# Patient Record
Sex: Male | Born: 1960 | Race: White | Hispanic: No | Smoking: Former smoker
Health system: Southern US, Community
[De-identification: ages and names within clinical notes are randomized; demographics above are authoritative.]

## PROBLEM LIST (undated history)

## (undated) DIAGNOSIS — D649 Anemia, unspecified: Secondary | ICD-10-CM

## (undated) DIAGNOSIS — I509 Heart failure, unspecified: Secondary | ICD-10-CM

## (undated) DIAGNOSIS — I10 Essential (primary) hypertension: Secondary | ICD-10-CM

## (undated) DIAGNOSIS — F32A Depression, unspecified: Secondary | ICD-10-CM

## (undated) DIAGNOSIS — R55 Syncope and collapse: Secondary | ICD-10-CM

## (undated) DIAGNOSIS — R000 Tachycardia, unspecified: Secondary | ICD-10-CM

## (undated) DIAGNOSIS — G709 Myoneural disorder, unspecified: Secondary | ICD-10-CM

## (undated) DIAGNOSIS — K7460 Unspecified cirrhosis of liver: Secondary | ICD-10-CM

## (undated) DIAGNOSIS — R42 Dizziness and giddiness: Secondary | ICD-10-CM

## (undated) DIAGNOSIS — F419 Anxiety disorder, unspecified: Secondary | ICD-10-CM

## (undated) DIAGNOSIS — R011 Cardiac murmur, unspecified: Secondary | ICD-10-CM

## (undated) HISTORY — DX: Anemia, unspecified: D64.9

## (undated) HISTORY — PX: KNEE SURGERY: SHX244

## (undated) HISTORY — DX: Syncope and collapse: R55

## (undated) HISTORY — PX: LEG SURGERY: SHX1003

## (undated) HISTORY — PX: COLON SURGERY: SHX602

## (undated) HISTORY — DX: Heart failure, unspecified: I50.9

## (undated) HISTORY — DX: Tachycardia, unspecified: R00.0

## (undated) HISTORY — DX: Essential (primary) hypertension: I10

## (undated) HISTORY — DX: Dizziness and giddiness: R42

## (undated) HISTORY — DX: Unspecified cirrhosis of liver: K74.60

---

## 1984-08-22 HISTORY — PX: LEG SURGERY: SHX1003

## 1999-08-23 HISTORY — PX: KNEE SURGERY: SHX244

## 2006-04-24 ENCOUNTER — Inpatient Hospital Stay: Payer: Self-pay | Admitting: Internal Medicine

## 2006-04-28 ENCOUNTER — Ambulatory Visit: Payer: Self-pay | Admitting: Family Medicine

## 2006-04-28 ENCOUNTER — Ambulatory Visit: Payer: Self-pay | Admitting: Critical Care Medicine

## 2006-04-28 ENCOUNTER — Inpatient Hospital Stay (HOSPITAL_COMMUNITY): Admission: AD | Admit: 2006-04-28 | Discharge: 2006-05-17 | Payer: Self-pay | Admitting: Critical Care Medicine

## 2006-05-17 ENCOUNTER — Ambulatory Visit: Payer: Self-pay | Admitting: Internal Medicine

## 2006-05-24 ENCOUNTER — Ambulatory Visit (HOSPITAL_COMMUNITY): Admission: RE | Admit: 2006-05-24 | Discharge: 2006-05-24 | Payer: Self-pay | Admitting: Family Medicine

## 2006-05-25 ENCOUNTER — Ambulatory Visit: Payer: Self-pay | Admitting: Family Medicine

## 2006-06-08 ENCOUNTER — Ambulatory Visit (HOSPITAL_COMMUNITY): Admission: RE | Admit: 2006-06-08 | Discharge: 2006-06-08 | Payer: Self-pay | Admitting: Family Medicine

## 2006-06-08 ENCOUNTER — Ambulatory Visit: Payer: Self-pay | Admitting: Sports Medicine

## 2006-06-15 ENCOUNTER — Ambulatory Visit: Payer: Self-pay | Admitting: Family Medicine

## 2006-06-28 ENCOUNTER — Ambulatory Visit: Payer: Self-pay | Admitting: Family Medicine

## 2006-07-03 ENCOUNTER — Encounter: Admission: RE | Admit: 2006-07-03 | Discharge: 2006-10-01 | Payer: Self-pay | Admitting: Family Medicine

## 2006-07-11 ENCOUNTER — Ambulatory Visit: Payer: Self-pay | Admitting: Family Medicine

## 2006-07-28 ENCOUNTER — Ambulatory Visit: Payer: Self-pay | Admitting: Family Medicine

## 2006-08-11 ENCOUNTER — Ambulatory Visit: Payer: Self-pay | Admitting: Family Medicine

## 2006-08-18 ENCOUNTER — Ambulatory Visit: Payer: Self-pay | Admitting: Family Medicine

## 2006-09-04 ENCOUNTER — Encounter (INDEPENDENT_AMBULATORY_CARE_PROVIDER_SITE_OTHER): Payer: Self-pay | Admitting: Family Medicine

## 2006-09-04 ENCOUNTER — Ambulatory Visit: Payer: Self-pay | Admitting: Sports Medicine

## 2006-09-04 ENCOUNTER — Ambulatory Visit (HOSPITAL_COMMUNITY): Admission: RE | Admit: 2006-09-04 | Discharge: 2006-09-04 | Payer: Self-pay | Admitting: Family Medicine

## 2006-09-04 LAB — CONVERTED CEMR LAB
ALT: 21 units/L (ref 0–53)
AST: 22 units/L (ref 0–37)
Creatinine, Ser: 1.18 mg/dL (ref 0.40–1.50)
Sodium: 135 meq/L (ref 135–145)
Total Bilirubin: 0.9 mg/dL (ref 0.3–1.2)
Total Protein: 7.7 g/dL (ref 6.0–8.3)

## 2006-10-02 ENCOUNTER — Ambulatory Visit: Payer: Self-pay | Admitting: Family Medicine

## 2006-10-11 ENCOUNTER — Encounter: Admission: RE | Admit: 2006-10-11 | Discharge: 2007-01-09 | Payer: Self-pay | Admitting: Family Medicine

## 2006-10-17 ENCOUNTER — Ambulatory Visit: Payer: Self-pay | Admitting: Family Medicine

## 2006-10-18 ENCOUNTER — Encounter (INDEPENDENT_AMBULATORY_CARE_PROVIDER_SITE_OTHER): Payer: Self-pay | Admitting: Family Medicine

## 2006-10-18 LAB — CONVERTED CEMR LAB
Albumin: 3.9 g/dL (ref 3.5–5.2)
Alkaline Phosphatase: 73 units/L (ref 39–117)
CO2: 22 meq/L (ref 19–32)
Calcium: 9.3 mg/dL (ref 8.4–10.5)
Chloride: 101 meq/L (ref 96–112)
Digitoxin Lvl: 0.5 ng/mL — ABNORMAL LOW (ref 0.8–2.0)
Glucose, Bld: 382 mg/dL — ABNORMAL HIGH (ref 70–99)
Potassium: 4.5 meq/L (ref 3.5–5.3)
Sodium: 133 meq/L — ABNORMAL LOW (ref 135–145)
Total Protein: 7.2 g/dL (ref 6.0–8.3)

## 2006-10-19 DIAGNOSIS — F1011 Alcohol abuse, in remission: Secondary | ICD-10-CM | POA: Insufficient documentation

## 2006-10-19 DIAGNOSIS — F1021 Alcohol dependence, in remission: Secondary | ICD-10-CM

## 2006-10-19 DIAGNOSIS — E1140 Type 2 diabetes mellitus with diabetic neuropathy, unspecified: Secondary | ICD-10-CM

## 2006-10-25 ENCOUNTER — Telehealth: Payer: Self-pay | Admitting: *Deleted

## 2006-11-06 ENCOUNTER — Telehealth (INDEPENDENT_AMBULATORY_CARE_PROVIDER_SITE_OTHER): Payer: Self-pay | Admitting: Family Medicine

## 2006-11-08 ENCOUNTER — Encounter (INDEPENDENT_AMBULATORY_CARE_PROVIDER_SITE_OTHER): Payer: Self-pay | Admitting: Family Medicine

## 2006-11-21 ENCOUNTER — Encounter (INDEPENDENT_AMBULATORY_CARE_PROVIDER_SITE_OTHER): Payer: Self-pay | Admitting: Family Medicine

## 2006-11-24 ENCOUNTER — Ambulatory Visit: Payer: Self-pay | Admitting: Family Medicine

## 2006-11-24 ENCOUNTER — Telehealth (INDEPENDENT_AMBULATORY_CARE_PROVIDER_SITE_OTHER): Payer: Self-pay | Admitting: Family Medicine

## 2006-11-24 ENCOUNTER — Encounter (INDEPENDENT_AMBULATORY_CARE_PROVIDER_SITE_OTHER): Payer: Self-pay | Admitting: Family Medicine

## 2006-11-24 LAB — CONVERTED CEMR LAB
Calcium: 9.6 mg/dL (ref 8.4–10.5)
Digitoxin Lvl: 0.8 ng/mL (ref 0.8–2.0)
Glucose, Bld: 163 mg/dL — ABNORMAL HIGH (ref 70–99)
Sodium: 135 meq/L (ref 135–145)

## 2006-11-25 ENCOUNTER — Encounter (INDEPENDENT_AMBULATORY_CARE_PROVIDER_SITE_OTHER): Payer: Self-pay | Admitting: Family Medicine

## 2006-12-11 ENCOUNTER — Telehealth: Payer: Self-pay | Admitting: *Deleted

## 2006-12-20 ENCOUNTER — Encounter (INDEPENDENT_AMBULATORY_CARE_PROVIDER_SITE_OTHER): Payer: Self-pay | Admitting: Family Medicine

## 2006-12-25 ENCOUNTER — Telehealth: Payer: Self-pay | Admitting: *Deleted

## 2006-12-27 ENCOUNTER — Telehealth (INDEPENDENT_AMBULATORY_CARE_PROVIDER_SITE_OTHER): Payer: Self-pay | Admitting: *Deleted

## 2007-02-15 ENCOUNTER — Encounter: Admission: RE | Admit: 2007-02-15 | Discharge: 2007-02-15 | Payer: Self-pay | Admitting: Family Medicine

## 2007-02-20 ENCOUNTER — Encounter (INDEPENDENT_AMBULATORY_CARE_PROVIDER_SITE_OTHER): Payer: Self-pay | Admitting: Family Medicine

## 2007-02-20 ENCOUNTER — Ambulatory Visit: Payer: Self-pay | Admitting: Sports Medicine

## 2007-02-20 DIAGNOSIS — I10 Essential (primary) hypertension: Secondary | ICD-10-CM | POA: Insufficient documentation

## 2007-02-20 LAB — CONVERTED CEMR LAB
AST: 21 units/L (ref 0–37)
Albumin: 4.4 g/dL (ref 3.5–5.2)
Alkaline Phosphatase: 71 units/L (ref 39–117)
Chloride: 100 meq/L (ref 96–112)
Digitoxin Lvl: 0.8 ng/mL (ref 0.8–2.0)
Glucose, Bld: 294 mg/dL — ABNORMAL HIGH (ref 70–99)
Hgb A1c MFr Bld: 6 %
MCHC: 34 g/dL (ref 30.0–36.0)
Potassium: 4.6 meq/L (ref 3.5–5.3)
RBC: 4.15 M/uL — ABNORMAL LOW (ref 4.22–5.81)
Sodium: 132 meq/L — ABNORMAL LOW (ref 135–145)
Total Protein: 7.7 g/dL (ref 6.0–8.3)

## 2007-02-21 ENCOUNTER — Encounter (INDEPENDENT_AMBULATORY_CARE_PROVIDER_SITE_OTHER): Payer: Self-pay | Admitting: Family Medicine

## 2007-02-28 ENCOUNTER — Encounter (INDEPENDENT_AMBULATORY_CARE_PROVIDER_SITE_OTHER): Payer: Self-pay | Admitting: Family Medicine

## 2007-04-05 ENCOUNTER — Ambulatory Visit: Payer: Self-pay | Admitting: Family Medicine

## 2007-04-12 ENCOUNTER — Encounter (INDEPENDENT_AMBULATORY_CARE_PROVIDER_SITE_OTHER): Payer: Self-pay | Admitting: Family Medicine

## 2007-04-25 ENCOUNTER — Telehealth: Payer: Self-pay | Admitting: *Deleted

## 2007-05-10 ENCOUNTER — Encounter (INDEPENDENT_AMBULATORY_CARE_PROVIDER_SITE_OTHER): Payer: Self-pay | Admitting: Family Medicine

## 2007-05-17 ENCOUNTER — Encounter (INDEPENDENT_AMBULATORY_CARE_PROVIDER_SITE_OTHER): Payer: Self-pay | Admitting: Family Medicine

## 2007-05-17 ENCOUNTER — Encounter: Admission: RE | Admit: 2007-05-17 | Discharge: 2007-05-17 | Payer: Self-pay | Admitting: *Deleted

## 2007-05-18 ENCOUNTER — Ambulatory Visit: Payer: Self-pay | Admitting: Family Medicine

## 2007-05-28 ENCOUNTER — Telehealth (INDEPENDENT_AMBULATORY_CARE_PROVIDER_SITE_OTHER): Payer: Self-pay | Admitting: Family Medicine

## 2007-06-20 ENCOUNTER — Ambulatory Visit: Payer: Self-pay | Admitting: Family Medicine

## 2007-07-26 ENCOUNTER — Encounter (INDEPENDENT_AMBULATORY_CARE_PROVIDER_SITE_OTHER): Payer: Self-pay | Admitting: Family Medicine

## 2007-07-26 ENCOUNTER — Ambulatory Visit: Payer: Self-pay | Admitting: Family Medicine

## 2007-07-27 LAB — CONVERTED CEMR LAB
AST: 19 units/L (ref 0–37)
Alkaline Phosphatase: 53 units/L (ref 39–117)
BUN: 27 mg/dL — ABNORMAL HIGH (ref 6–23)
Creatinine, Ser: 1.28 mg/dL (ref 0.40–1.50)
HCT: 37.8 % — ABNORMAL LOW (ref 39.0–52.0)
Hemoglobin: 12.9 g/dL — ABNORMAL LOW (ref 13.0–17.0)
MCHC: 34.1 g/dL (ref 30.0–36.0)
RDW: 13.4 % (ref 11.5–15.5)

## 2007-08-09 ENCOUNTER — Encounter: Admission: RE | Admit: 2007-08-09 | Discharge: 2007-08-10 | Payer: Self-pay | Admitting: Family Medicine

## 2007-08-10 ENCOUNTER — Telehealth (INDEPENDENT_AMBULATORY_CARE_PROVIDER_SITE_OTHER): Payer: Self-pay | Admitting: Family Medicine

## 2007-08-10 ENCOUNTER — Encounter (INDEPENDENT_AMBULATORY_CARE_PROVIDER_SITE_OTHER): Payer: Self-pay | Admitting: Family Medicine

## 2007-08-27 ENCOUNTER — Telehealth: Payer: Self-pay | Admitting: *Deleted

## 2007-08-29 ENCOUNTER — Telehealth: Payer: Self-pay | Admitting: *Deleted

## 2007-09-05 ENCOUNTER — Telehealth (INDEPENDENT_AMBULATORY_CARE_PROVIDER_SITE_OTHER): Payer: Self-pay | Admitting: Family Medicine

## 2007-09-14 ENCOUNTER — Telehealth (INDEPENDENT_AMBULATORY_CARE_PROVIDER_SITE_OTHER): Payer: Self-pay | Admitting: Family Medicine

## 2007-09-17 ENCOUNTER — Telehealth (INDEPENDENT_AMBULATORY_CARE_PROVIDER_SITE_OTHER): Payer: Self-pay | Admitting: Family Medicine

## 2007-09-18 ENCOUNTER — Telehealth (INDEPENDENT_AMBULATORY_CARE_PROVIDER_SITE_OTHER): Payer: Self-pay | Admitting: Family Medicine

## 2007-09-21 ENCOUNTER — Telehealth (INDEPENDENT_AMBULATORY_CARE_PROVIDER_SITE_OTHER): Payer: Self-pay | Admitting: Family Medicine

## 2007-10-19 ENCOUNTER — Ambulatory Visit: Payer: Self-pay | Admitting: Family Medicine

## 2007-10-29 ENCOUNTER — Telehealth: Payer: Self-pay | Admitting: *Deleted

## 2007-10-30 ENCOUNTER — Telehealth: Payer: Self-pay | Admitting: *Deleted

## 2007-11-08 ENCOUNTER — Encounter: Admission: RE | Admit: 2007-11-08 | Discharge: 2007-11-08 | Payer: Self-pay | Admitting: Family Medicine

## 2007-11-09 ENCOUNTER — Encounter (INDEPENDENT_AMBULATORY_CARE_PROVIDER_SITE_OTHER): Payer: Self-pay | Admitting: Family Medicine

## 2007-11-22 ENCOUNTER — Ambulatory Visit: Payer: Self-pay | Admitting: Family Medicine

## 2008-01-15 ENCOUNTER — Ambulatory Visit: Payer: Self-pay | Admitting: Family Medicine

## 2008-01-15 ENCOUNTER — Encounter (INDEPENDENT_AMBULATORY_CARE_PROVIDER_SITE_OTHER): Payer: Self-pay | Admitting: Family Medicine

## 2008-01-15 LAB — CONVERTED CEMR LAB: Direct LDL: 123 mg/dL — ABNORMAL HIGH

## 2008-02-06 ENCOUNTER — Telehealth: Payer: Self-pay | Admitting: *Deleted

## 2008-02-13 ENCOUNTER — Encounter: Admission: RE | Admit: 2008-02-13 | Discharge: 2008-02-13 | Payer: Self-pay | Admitting: Family Medicine

## 2008-02-14 ENCOUNTER — Encounter (INDEPENDENT_AMBULATORY_CARE_PROVIDER_SITE_OTHER): Payer: Self-pay | Admitting: Family Medicine

## 2008-02-18 ENCOUNTER — Telehealth (INDEPENDENT_AMBULATORY_CARE_PROVIDER_SITE_OTHER): Payer: Self-pay | Admitting: Family Medicine

## 2008-03-05 ENCOUNTER — Telehealth: Payer: Self-pay | Admitting: *Deleted

## 2008-03-25 ENCOUNTER — Telehealth: Payer: Self-pay | Admitting: *Deleted

## 2008-04-01 ENCOUNTER — Telehealth: Payer: Self-pay | Admitting: *Deleted

## 2008-04-02 ENCOUNTER — Ambulatory Visit: Payer: Self-pay | Admitting: Family Medicine

## 2008-05-20 ENCOUNTER — Encounter (INDEPENDENT_AMBULATORY_CARE_PROVIDER_SITE_OTHER): Payer: Self-pay | Admitting: Family Medicine

## 2008-05-23 ENCOUNTER — Ambulatory Visit: Payer: Self-pay | Admitting: Family Medicine

## 2008-05-23 ENCOUNTER — Encounter (INDEPENDENT_AMBULATORY_CARE_PROVIDER_SITE_OTHER): Payer: Self-pay | Admitting: Family Medicine

## 2008-05-23 DIAGNOSIS — E785 Hyperlipidemia, unspecified: Secondary | ICD-10-CM

## 2008-05-23 DIAGNOSIS — E7800 Pure hypercholesterolemia, unspecified: Secondary | ICD-10-CM | POA: Insufficient documentation

## 2008-05-23 LAB — CONVERTED CEMR LAB
ALT: 19 units/L (ref 0–53)
AST: 15 units/L (ref 0–37)
Albumin: 4.6 g/dL (ref 3.5–5.2)
Calcium: 9.5 mg/dL (ref 8.4–10.5)
Chloride: 102 meq/L (ref 96–112)
Hgb A1c MFr Bld: 6.8 %
Potassium: 4.3 meq/L (ref 3.5–5.3)

## 2008-05-26 ENCOUNTER — Encounter (INDEPENDENT_AMBULATORY_CARE_PROVIDER_SITE_OTHER): Payer: Self-pay | Admitting: Family Medicine

## 2008-05-26 ENCOUNTER — Telehealth: Payer: Self-pay | Admitting: *Deleted

## 2008-06-12 ENCOUNTER — Ambulatory Visit: Payer: Self-pay | Admitting: Family Medicine

## 2008-06-19 ENCOUNTER — Ambulatory Visit: Payer: Self-pay | Admitting: Family Medicine

## 2008-07-01 ENCOUNTER — Telehealth: Payer: Self-pay | Admitting: *Deleted

## 2008-07-07 ENCOUNTER — Ambulatory Visit: Payer: Self-pay | Admitting: Family Medicine

## 2008-07-16 ENCOUNTER — Telehealth: Payer: Self-pay | Admitting: Pharmacist

## 2008-07-24 ENCOUNTER — Telehealth (INDEPENDENT_AMBULATORY_CARE_PROVIDER_SITE_OTHER): Payer: Self-pay | Admitting: Family Medicine

## 2008-07-30 ENCOUNTER — Ambulatory Visit: Payer: Self-pay | Admitting: Family Medicine

## 2008-08-06 ENCOUNTER — Encounter: Admission: RE | Admit: 2008-08-06 | Discharge: 2008-08-06 | Payer: Self-pay | Admitting: Family Medicine

## 2008-08-21 ENCOUNTER — Telehealth (INDEPENDENT_AMBULATORY_CARE_PROVIDER_SITE_OTHER): Payer: Self-pay | Admitting: *Deleted

## 2008-08-26 ENCOUNTER — Ambulatory Visit: Payer: Self-pay | Admitting: Family Medicine

## 2008-08-26 DIAGNOSIS — F411 Generalized anxiety disorder: Secondary | ICD-10-CM | POA: Insufficient documentation

## 2008-08-28 ENCOUNTER — Encounter (INDEPENDENT_AMBULATORY_CARE_PROVIDER_SITE_OTHER): Payer: Self-pay | Admitting: Family Medicine

## 2008-09-02 ENCOUNTER — Telehealth (INDEPENDENT_AMBULATORY_CARE_PROVIDER_SITE_OTHER): Payer: Self-pay | Admitting: Family Medicine

## 2008-09-04 ENCOUNTER — Ambulatory Visit: Payer: Self-pay | Admitting: Family Medicine

## 2008-09-05 ENCOUNTER — Telehealth: Payer: Self-pay | Admitting: Psychology

## 2008-09-12 ENCOUNTER — Ambulatory Visit: Payer: Self-pay | Admitting: Family Medicine

## 2008-09-12 ENCOUNTER — Telehealth (INDEPENDENT_AMBULATORY_CARE_PROVIDER_SITE_OTHER): Payer: Self-pay | Admitting: Family Medicine

## 2008-09-18 ENCOUNTER — Ambulatory Visit: Payer: Self-pay | Admitting: Family Medicine

## 2008-09-22 ENCOUNTER — Encounter (INDEPENDENT_AMBULATORY_CARE_PROVIDER_SITE_OTHER): Payer: Self-pay | Admitting: Family Medicine

## 2008-09-25 ENCOUNTER — Ambulatory Visit: Payer: Self-pay | Admitting: Psychology

## 2008-09-26 ENCOUNTER — Telehealth (INDEPENDENT_AMBULATORY_CARE_PROVIDER_SITE_OTHER): Payer: Self-pay | Admitting: Family Medicine

## 2008-10-02 ENCOUNTER — Ambulatory Visit: Payer: Self-pay | Admitting: Family Medicine

## 2008-10-02 ENCOUNTER — Encounter (INDEPENDENT_AMBULATORY_CARE_PROVIDER_SITE_OTHER): Payer: Self-pay | Admitting: Family Medicine

## 2008-10-09 ENCOUNTER — Ambulatory Visit: Payer: Self-pay | Admitting: Psychology

## 2008-10-22 ENCOUNTER — Encounter (INDEPENDENT_AMBULATORY_CARE_PROVIDER_SITE_OTHER): Payer: Self-pay | Admitting: Family Medicine

## 2008-10-23 ENCOUNTER — Ambulatory Visit: Payer: Self-pay | Admitting: Psychology

## 2008-11-07 ENCOUNTER — Encounter (INDEPENDENT_AMBULATORY_CARE_PROVIDER_SITE_OTHER): Payer: Self-pay | Admitting: Family Medicine

## 2008-11-07 LAB — CONVERTED CEMR LAB
Alkaline Phosphatase: 48 units/L
Creatinine, Ser: 1 mg/dL
Glucose, Urine, Semiquant: 108

## 2008-11-12 ENCOUNTER — Encounter (INDEPENDENT_AMBULATORY_CARE_PROVIDER_SITE_OTHER): Payer: Self-pay | Admitting: Family Medicine

## 2008-11-12 LAB — CONVERTED CEMR LAB: Hgb A1c MFr Bld: 6.2 %

## 2008-11-19 ENCOUNTER — Ambulatory Visit: Payer: Self-pay | Admitting: Family Medicine

## 2008-11-25 ENCOUNTER — Telehealth: Payer: Self-pay | Admitting: *Deleted

## 2008-11-25 ENCOUNTER — Ambulatory Visit: Payer: Self-pay | Admitting: Family Medicine

## 2008-11-27 ENCOUNTER — Encounter: Admission: RE | Admit: 2008-11-27 | Discharge: 2008-11-27 | Payer: Self-pay | Admitting: Family Medicine

## 2008-11-28 ENCOUNTER — Telehealth (INDEPENDENT_AMBULATORY_CARE_PROVIDER_SITE_OTHER): Payer: Self-pay | Admitting: Family Medicine

## 2008-12-02 ENCOUNTER — Telehealth: Payer: Self-pay | Admitting: Psychology

## 2008-12-10 ENCOUNTER — Ambulatory Visit (HOSPITAL_COMMUNITY): Admission: RE | Admit: 2008-12-10 | Discharge: 2008-12-10 | Payer: Self-pay | Admitting: Advanced Practice Midwife

## 2008-12-24 ENCOUNTER — Ambulatory Visit: Payer: Self-pay | Admitting: Family Medicine

## 2008-12-24 DIAGNOSIS — F319 Bipolar disorder, unspecified: Secondary | ICD-10-CM

## 2009-01-22 ENCOUNTER — Encounter (INDEPENDENT_AMBULATORY_CARE_PROVIDER_SITE_OTHER): Payer: Self-pay | Admitting: Family Medicine

## 2009-01-30 ENCOUNTER — Ambulatory Visit: Payer: Self-pay | Admitting: Family Medicine

## 2009-02-04 ENCOUNTER — Ambulatory Visit: Payer: Self-pay | Admitting: Family Medicine

## 2009-02-13 ENCOUNTER — Encounter: Payer: Self-pay | Admitting: Family Medicine

## 2009-02-18 ENCOUNTER — Encounter: Payer: Self-pay | Admitting: Psychology

## 2009-02-18 ENCOUNTER — Ambulatory Visit: Payer: Self-pay | Admitting: Family Medicine

## 2009-02-18 ENCOUNTER — Encounter: Payer: Self-pay | Admitting: Family Medicine

## 2009-02-19 LAB — CONVERTED CEMR LAB
BUN: 12 mg/dL (ref 6–23)
Chloride: 105 meq/L (ref 96–112)
Creatinine, Ser: 1.04 mg/dL (ref 0.40–1.50)
Glucose, Bld: 164 mg/dL — ABNORMAL HIGH (ref 70–99)
Lithium Lvl: 0.78 meq/L — ABNORMAL LOW (ref 0.80–1.40)

## 2009-02-25 ENCOUNTER — Telehealth: Payer: Self-pay | Admitting: Psychology

## 2009-02-26 ENCOUNTER — Ambulatory Visit: Payer: Self-pay | Admitting: Family Medicine

## 2009-02-26 ENCOUNTER — Encounter: Payer: Self-pay | Admitting: Psychology

## 2009-03-04 ENCOUNTER — Ambulatory Visit: Payer: Self-pay | Admitting: Family Medicine

## 2009-04-01 ENCOUNTER — Ambulatory Visit: Payer: Self-pay | Admitting: Family Medicine

## 2009-04-08 ENCOUNTER — Telehealth: Payer: Self-pay | Admitting: Psychology

## 2009-04-09 ENCOUNTER — Telehealth: Payer: Self-pay | Admitting: *Deleted

## 2009-05-15 ENCOUNTER — Telehealth: Payer: Self-pay | Admitting: Family Medicine

## 2009-05-25 ENCOUNTER — Telehealth: Payer: Self-pay | Admitting: *Deleted

## 2009-05-27 ENCOUNTER — Encounter: Payer: Self-pay | Admitting: *Deleted

## 2009-05-29 ENCOUNTER — Ambulatory Visit: Payer: Self-pay | Admitting: Family Medicine

## 2009-05-29 ENCOUNTER — Encounter: Payer: Self-pay | Admitting: Family Medicine

## 2009-06-02 ENCOUNTER — Encounter: Payer: Self-pay | Admitting: Family Medicine

## 2009-06-02 LAB — CONVERTED CEMR LAB
Alkaline Phosphatase: 39 units/L (ref 39–117)
BUN: 20 mg/dL (ref 6–23)
Creatinine, Ser: 1.13 mg/dL (ref 0.40–1.50)
Direct LDL: 79 mg/dL
Glucose, Bld: 99 mg/dL (ref 70–99)
HCT: 40.6 % (ref 39.0–52.0)
Hemoglobin: 13.1 g/dL (ref 13.0–17.0)
MCHC: 32.3 g/dL (ref 30.0–36.0)
MCV: 88.5 fL (ref 78.0–100.0)
RBC: 4.59 M/uL (ref 4.22–5.81)
Sodium: 138 meq/L (ref 135–145)
TSH: 3.009 microintl units/mL (ref 0.350–4.500)
Total Bilirubin: 1.1 mg/dL (ref 0.3–1.2)

## 2009-06-11 ENCOUNTER — Telehealth: Payer: Self-pay | Admitting: Family Medicine

## 2009-06-24 ENCOUNTER — Ambulatory Visit: Payer: Self-pay | Admitting: Family Medicine

## 2009-07-08 ENCOUNTER — Telehealth: Payer: Self-pay | Admitting: Family Medicine

## 2009-07-08 ENCOUNTER — Ambulatory Visit: Payer: Self-pay | Admitting: Family Medicine

## 2009-07-13 ENCOUNTER — Encounter: Payer: Self-pay | Admitting: Family Medicine

## 2009-07-22 ENCOUNTER — Ambulatory Visit: Payer: Self-pay | Admitting: Family Medicine

## 2009-07-29 ENCOUNTER — Encounter: Payer: Self-pay | Admitting: Family Medicine

## 2009-08-19 ENCOUNTER — Telehealth: Payer: Self-pay | Admitting: Psychology

## 2009-08-26 ENCOUNTER — Ambulatory Visit: Payer: Self-pay | Admitting: Family Medicine

## 2009-09-23 ENCOUNTER — Ambulatory Visit: Payer: Self-pay | Admitting: Family Medicine

## 2009-09-23 LAB — CONVERTED CEMR LAB: Hgb A1c MFr Bld: 6 %

## 2009-10-08 ENCOUNTER — Telehealth: Payer: Self-pay | Admitting: Family Medicine

## 2009-10-14 ENCOUNTER — Telehealth: Payer: Self-pay | Admitting: Family Medicine

## 2009-10-20 ENCOUNTER — Encounter: Payer: Self-pay | Admitting: Family Medicine

## 2009-10-20 ENCOUNTER — Ambulatory Visit: Payer: Self-pay | Admitting: Family Medicine

## 2009-10-21 ENCOUNTER — Ambulatory Visit: Payer: Self-pay | Admitting: Family Medicine

## 2009-10-23 ENCOUNTER — Telehealth: Payer: Self-pay | Admitting: Family Medicine

## 2009-10-26 ENCOUNTER — Encounter: Payer: Self-pay | Admitting: Family Medicine

## 2009-11-10 ENCOUNTER — Ambulatory Visit: Payer: Self-pay | Admitting: Family Medicine

## 2009-11-10 ENCOUNTER — Encounter (INDEPENDENT_AMBULATORY_CARE_PROVIDER_SITE_OTHER): Payer: Self-pay | Admitting: Pharmacist

## 2009-11-10 LAB — CONVERTED CEMR LAB
ALT: 16 units/L (ref 0–53)
AST: 12 units/L (ref 0–37)
Albumin: 4.7 g/dL (ref 3.5–5.2)
Alkaline Phosphatase: 49 units/L (ref 39–117)
Calcium: 9.9 mg/dL (ref 8.4–10.5)
Chloride: 102 meq/L (ref 96–112)
Potassium: 4.7 meq/L (ref 3.5–5.3)
Sodium: 135 meq/L (ref 135–145)

## 2009-11-11 ENCOUNTER — Ambulatory Visit: Payer: Self-pay | Admitting: Family Medicine

## 2009-11-27 ENCOUNTER — Telehealth: Payer: Self-pay | Admitting: Family Medicine

## 2009-12-02 ENCOUNTER — Ambulatory Visit: Payer: Self-pay | Admitting: Family Medicine

## 2009-12-02 ENCOUNTER — Encounter: Payer: Self-pay | Admitting: Family Medicine

## 2009-12-02 ENCOUNTER — Telehealth: Payer: Self-pay | Admitting: Family Medicine

## 2009-12-04 LAB — CONVERTED CEMR LAB
BUN: 12 mg/dL (ref 6–23)
CO2: 25 meq/L (ref 19–32)
Calcium: 9.8 mg/dL (ref 8.4–10.5)
Glucose, Bld: 164 mg/dL — ABNORMAL HIGH (ref 70–99)
Lithium Lvl: 0.75 meq/L — ABNORMAL LOW (ref 0.80–1.40)

## 2009-12-08 ENCOUNTER — Ambulatory Visit: Payer: Self-pay | Admitting: Family Medicine

## 2009-12-16 ENCOUNTER — Ambulatory Visit: Payer: Self-pay | Admitting: Family Medicine

## 2009-12-16 ENCOUNTER — Encounter: Payer: Self-pay | Admitting: Psychology

## 2009-12-25 ENCOUNTER — Ambulatory Visit: Payer: Self-pay | Admitting: Family Medicine

## 2009-12-25 ENCOUNTER — Encounter: Payer: Self-pay | Admitting: Psychology

## 2010-01-01 ENCOUNTER — Encounter: Payer: Self-pay | Admitting: Psychology

## 2010-01-01 ENCOUNTER — Ambulatory Visit: Payer: Self-pay | Admitting: Family Medicine

## 2010-01-04 ENCOUNTER — Telehealth: Payer: Self-pay | Admitting: Psychology

## 2010-01-04 LAB — CONVERTED CEMR LAB
AST: 14 units/L (ref 0–37)
Albumin: 4.6 g/dL (ref 3.5–5.2)
Alkaline Phosphatase: 39 units/L (ref 39–117)
Ammonia: 30 umol/L (ref 11–35)
BUN: 13 mg/dL (ref 6–23)
CO2: 27 meq/L (ref 19–32)
CO2: 27 meq/L (ref 19–32)
Calcium: 9.3 mg/dL (ref 8.4–10.5)
Chloride: 104 meq/L (ref 96–112)
Creatinine, Ser: 1.12 mg/dL (ref 0.40–1.50)
Creatinine, Ser: 1.13 mg/dL (ref 0.40–1.50)
Glucose, Bld: 137 mg/dL — ABNORMAL HIGH (ref 70–99)
Glucose, Bld: 212 mg/dL — ABNORMAL HIGH (ref 70–99)
HCT: 40.9 % (ref 39.0–52.0)
HCT: 40.3 % (ref 39.0–52.0)
Hemoglobin: 12.7 g/dL — ABNORMAL LOW (ref 13.0–17.0)
MCHC: 32.5 g/dL (ref 30.0–36.0)
MCV: 89.5 fL (ref 78.0–100.0)
Potassium: 4.7 meq/L (ref 3.5–5.3)
RBC: 4.57 M/uL (ref 4.22–5.81)
RBC: 4.43 M/uL (ref 4.22–5.81)
Sodium: 138 meq/L (ref 135–145)
Total Bilirubin: 1 mg/dL (ref 0.3–1.2)
Total Protein: 6.7 g/dL (ref 6.0–8.3)
Valproic Acid Lvl: 48.5 ug/mL — ABNORMAL LOW (ref 50.0–100.0)
Valproic Acid Lvl: 17 ug/mL — ABNORMAL LOW (ref 50.0–100.0)
WBC: 2.7 10*3/uL — ABNORMAL LOW (ref 4.0–10.5)
WBC: 2.5 10*3/uL — ABNORMAL LOW (ref 4.0–10.5)

## 2010-01-06 ENCOUNTER — Ambulatory Visit: Payer: Self-pay | Admitting: Psychology

## 2010-01-06 LAB — CONVERTED CEMR LAB: Ammonia: 25 umol/L (ref 11–35)

## 2010-01-08 LAB — CONVERTED CEMR LAB
ALT: 14 units/L (ref 0–53)
Albumin: 5.3 g/dL — ABNORMAL HIGH (ref 3.5–5.2)
CO2: 27 meq/L (ref 19–32)
Glucose, Bld: 135 mg/dL — ABNORMAL HIGH (ref 70–99)
MCV: 88.4 fL (ref 78.0–100.0)
Platelets: 80 10*3/uL — ABNORMAL LOW (ref 150–400)
Potassium: 4.9 meq/L (ref 3.5–5.3)
RBC: 4.84 M/uL (ref 4.22–5.81)
Sodium: 139 meq/L (ref 135–145)
Total Bilirubin: 0.8 mg/dL (ref 0.3–1.2)
Total Protein: 7.6 g/dL (ref 6.0–8.3)
Valproic Acid Lvl: 32.4 ug/mL — ABNORMAL LOW (ref 50.0–100.0)
WBC: 3.5 10*3/uL — ABNORMAL LOW (ref 4.0–10.5)

## 2010-01-15 ENCOUNTER — Encounter: Payer: Self-pay | Admitting: Psychology

## 2010-01-15 ENCOUNTER — Ambulatory Visit: Payer: Self-pay | Admitting: Family Medicine

## 2010-01-19 ENCOUNTER — Encounter: Payer: Self-pay | Admitting: Psychology

## 2010-01-20 ENCOUNTER — Ambulatory Visit: Payer: Self-pay | Admitting: Family Medicine

## 2010-01-20 LAB — CONVERTED CEMR LAB
ALT: 13 units/L (ref 0–53)
AST: 14 units/L (ref 0–37)
Alkaline Phosphatase: 36 units/L — ABNORMAL LOW (ref 39–117)
BUN: 15 mg/dL (ref 6–23)
Creatinine, Ser: 1.04 mg/dL (ref 0.40–1.50)
HCT: 40.8 % (ref 39.0–52.0)
Hemoglobin: 13.7 g/dL (ref 13.0–17.0)
MCHC: 33.6 g/dL (ref 30.0–36.0)
Platelets: 66 10*3/uL — ABNORMAL LOW (ref 150–400)
Potassium: 4.4 meq/L (ref 3.5–5.3)
RDW: 13.1 % (ref 11.5–15.5)

## 2010-01-25 ENCOUNTER — Telehealth: Payer: Self-pay | Admitting: Psychology

## 2010-01-26 ENCOUNTER — Telehealth: Payer: Self-pay | Admitting: Psychology

## 2010-02-01 ENCOUNTER — Ambulatory Visit: Payer: Self-pay | Admitting: Family Medicine

## 2010-02-01 ENCOUNTER — Encounter: Payer: Self-pay | Admitting: Psychology

## 2010-02-02 LAB — CONVERTED CEMR LAB
BUN: 18 mg/dL (ref 6–23)
CO2: 24 meq/L (ref 19–32)
Creatinine, Ser: 1.1 mg/dL (ref 0.40–1.50)
Glucose, Bld: 171 mg/dL — ABNORMAL HIGH (ref 70–99)
HCT: 39.1 % (ref 39.0–52.0)
Hemoglobin: 13.3 g/dL (ref 13.0–17.0)
MCV: 86.9 fL (ref 78.0–100.0)
RBC: 4.5 M/uL (ref 4.22–5.81)
Total Bilirubin: 0.8 mg/dL (ref 0.3–1.2)
Total Protein: 6.3 g/dL (ref 6.0–8.3)
Valproic Acid Lvl: 59.5 ug/mL (ref 50.0–100.0)
WBC: 3.5 10*3/uL — ABNORMAL LOW (ref 4.0–10.5)

## 2010-02-03 ENCOUNTER — Ambulatory Visit: Payer: Self-pay | Admitting: Family Medicine

## 2010-02-03 ENCOUNTER — Encounter: Payer: Self-pay | Admitting: Family Medicine

## 2010-02-12 ENCOUNTER — Ambulatory Visit: Payer: Self-pay | Admitting: Family Medicine

## 2010-02-12 ENCOUNTER — Encounter: Payer: Self-pay | Admitting: Psychology

## 2010-02-12 DIAGNOSIS — R51 Headache: Secondary | ICD-10-CM | POA: Insufficient documentation

## 2010-02-12 DIAGNOSIS — R519 Headache, unspecified: Secondary | ICD-10-CM | POA: Insufficient documentation

## 2010-02-12 LAB — CONVERTED CEMR LAB: Hgb A1c MFr Bld: 5.6 %

## 2010-02-15 LAB — CONVERTED CEMR LAB: Ammonia: 20 umol/L (ref 11–35)

## 2010-02-16 LAB — CONVERTED CEMR LAB
Albumin: 4.6 g/dL (ref 3.5–5.2)
Alkaline Phosphatase: 33 units/L — ABNORMAL LOW (ref 39–117)
Glucose, Bld: 128 mg/dL — ABNORMAL HIGH (ref 70–99)
MCHC: 33.6 g/dL (ref 30.0–36.0)
Potassium: 4.8 meq/L (ref 3.5–5.3)
RBC: 4.76 M/uL (ref 4.22–5.81)
Sodium: 139 meq/L (ref 135–145)
Total Protein: 6.6 g/dL (ref 6.0–8.3)
Valproic Acid Lvl: 51 ug/mL (ref 50.0–100.0)
WBC: 3.3 10*3/uL — ABNORMAL LOW (ref 4.0–10.5)

## 2010-02-24 ENCOUNTER — Ambulatory Visit: Payer: Self-pay | Admitting: Psychology

## 2010-02-24 ENCOUNTER — Ambulatory Visit: Payer: Self-pay | Admitting: Family Medicine

## 2010-02-26 LAB — CONVERTED CEMR LAB
AST: 12 units/L (ref 0–37)
Alkaline Phosphatase: 35 units/L — ABNORMAL LOW (ref 39–117)
BUN: 19 mg/dL (ref 6–23)
Creatinine, Ser: 0.99 mg/dL (ref 0.40–1.50)
HCT: 39.9 % (ref 39.0–52.0)
Hemoglobin: 13.3 g/dL (ref 13.0–17.0)
MCHC: 33.3 g/dL (ref 30.0–36.0)
Potassium: 4.5 meq/L (ref 3.5–5.3)
RDW: 13.3 % (ref 11.5–15.5)
Total Bilirubin: 0.8 mg/dL (ref 0.3–1.2)

## 2010-03-12 ENCOUNTER — Ambulatory Visit: Payer: Self-pay | Admitting: Family Medicine

## 2010-03-12 ENCOUNTER — Encounter: Payer: Self-pay | Admitting: Psychology

## 2010-03-15 LAB — CONVERTED CEMR LAB
CO2: 28 meq/L (ref 19–32)
Calcium: 9.4 mg/dL (ref 8.4–10.5)
Creatinine, Ser: 0.94 mg/dL (ref 0.40–1.50)
Glucose, Bld: 127 mg/dL — ABNORMAL HIGH (ref 70–99)
HCT: 38.5 % — ABNORMAL LOW (ref 39.0–52.0)
Hemoglobin: 12.8 g/dL — ABNORMAL LOW (ref 13.0–17.0)
Lithium Lvl: 0.67 meq/L — ABNORMAL LOW (ref 0.80–1.40)
MCV: 90.6 fL (ref 78.0–100.0)
RBC: 4.25 M/uL (ref 4.22–5.81)
Total Bilirubin: 1.1 mg/dL (ref 0.3–1.2)
Total Protein: 6.5 g/dL (ref 6.0–8.3)
Valproic Acid Lvl: 44.5 ug/mL — ABNORMAL LOW (ref 50.0–100.0)
WBC: 2.4 10*3/uL — ABNORMAL LOW (ref 4.0–10.5)

## 2010-03-17 ENCOUNTER — Ambulatory Visit: Payer: Self-pay | Admitting: Family Medicine

## 2010-03-19 ENCOUNTER — Encounter: Payer: Self-pay | Admitting: Psychology

## 2010-03-19 ENCOUNTER — Ambulatory Visit: Payer: Self-pay | Admitting: Family Medicine

## 2010-03-22 LAB — CONVERTED CEMR LAB
Alkaline Phosphatase: 29 units/L — ABNORMAL LOW (ref 39–117)
BUN: 13 mg/dL (ref 6–23)
Creatinine, Ser: 0.97 mg/dL (ref 0.40–1.50)
Glucose, Bld: 116 mg/dL — ABNORMAL HIGH (ref 70–99)
MCHC: 33.2 g/dL (ref 30.0–36.0)
MCV: 88.6 fL (ref 78.0–100.0)
RBC: 4.31 M/uL (ref 4.22–5.81)
Sodium: 139 meq/L (ref 135–145)
Total Bilirubin: 0.9 mg/dL (ref 0.3–1.2)
Total Protein: 6.4 g/dL (ref 6.0–8.3)

## 2010-04-05 ENCOUNTER — Ambulatory Visit: Payer: Self-pay | Admitting: Family Medicine

## 2010-04-05 ENCOUNTER — Telehealth: Payer: Self-pay | Admitting: *Deleted

## 2010-04-05 ENCOUNTER — Encounter: Payer: Self-pay | Admitting: Psychology

## 2010-04-06 LAB — CONVERTED CEMR LAB
Albumin: 4.6 g/dL (ref 3.5–5.2)
Ammonia: 16 umol/L (ref 11–35)
BUN: 19 mg/dL (ref 6–23)
CO2: 26 meq/L (ref 19–32)
Calcium: 9.8 mg/dL (ref 8.4–10.5)
Chloride: 105 meq/L (ref 96–112)
Creatinine, Ser: 1.03 mg/dL (ref 0.40–1.50)
Glucose, Bld: 140 mg/dL — ABNORMAL HIGH (ref 70–99)
HCT: 39.5 % (ref 39.0–52.0)
Hemoglobin: 13.3 g/dL (ref 13.0–17.0)
RBC: 4.42 M/uL (ref 4.22–5.81)
Valproic Acid Lvl: <1 ug/mL — ABNORMAL LOW (ref 50.0–100.0)
WBC: 3.4 10*3/uL — ABNORMAL LOW (ref 4.0–10.5)

## 2010-04-14 ENCOUNTER — Ambulatory Visit: Payer: Self-pay | Admitting: Family Medicine

## 2010-04-14 DIAGNOSIS — F909 Attention-deficit hyperactivity disorder, unspecified type: Secondary | ICD-10-CM | POA: Insufficient documentation

## 2010-04-27 ENCOUNTER — Telehealth: Payer: Self-pay | Admitting: Family Medicine

## 2010-04-28 ENCOUNTER — Telehealth: Payer: Self-pay | Admitting: Psychology

## 2010-05-19 ENCOUNTER — Ambulatory Visit: Payer: Self-pay | Admitting: Family Medicine

## 2010-05-19 ENCOUNTER — Encounter: Payer: Self-pay | Admitting: Family Medicine

## 2010-05-19 DIAGNOSIS — K219 Gastro-esophageal reflux disease without esophagitis: Secondary | ICD-10-CM | POA: Insufficient documentation

## 2010-05-19 LAB — CONVERTED CEMR LAB
Hgb A1c MFr Bld: 5.6 %
Lithium Lvl: 0.71 meq/L — ABNORMAL LOW (ref 0.80–1.40)
Tissue Transglutaminase Ab, IgA: 1.1 units (ref <20)

## 2010-05-21 ENCOUNTER — Ambulatory Visit: Payer: Self-pay | Admitting: Cardiovascular Disease

## 2010-06-02 ENCOUNTER — Telehealth: Payer: Self-pay | Admitting: Family Medicine

## 2010-06-04 ENCOUNTER — Telehealth: Payer: Self-pay | Admitting: Family Medicine

## 2010-06-15 ENCOUNTER — Ambulatory Visit: Payer: Self-pay

## 2010-06-15 ENCOUNTER — Ambulatory Visit (HOSPITAL_COMMUNITY): Admission: RE | Admit: 2010-06-15 | Discharge: 2010-06-15 | Payer: Self-pay | Admitting: Cardiovascular Disease

## 2010-06-15 ENCOUNTER — Encounter: Payer: Self-pay | Admitting: Cardiovascular Disease

## 2010-06-15 ENCOUNTER — Ambulatory Visit: Payer: Self-pay | Admitting: Cardiology

## 2010-06-16 ENCOUNTER — Ambulatory Visit: Payer: Self-pay | Admitting: Family Medicine

## 2010-06-16 ENCOUNTER — Encounter: Payer: Self-pay | Admitting: Family Medicine

## 2010-06-16 DIAGNOSIS — M79609 Pain in unspecified limb: Secondary | ICD-10-CM

## 2010-06-16 DIAGNOSIS — G609 Hereditary and idiopathic neuropathy, unspecified: Secondary | ICD-10-CM | POA: Insufficient documentation

## 2010-06-18 ENCOUNTER — Encounter: Payer: Self-pay | Admitting: Family Medicine

## 2010-07-02 ENCOUNTER — Encounter: Payer: Self-pay | Admitting: Family Medicine

## 2010-08-03 ENCOUNTER — Encounter: Payer: Self-pay | Admitting: Family Medicine

## 2010-08-04 ENCOUNTER — Ambulatory Visit: Payer: Self-pay | Admitting: Family Medicine

## 2010-08-05 ENCOUNTER — Ambulatory Visit: Payer: Self-pay | Admitting: Family Medicine

## 2010-08-05 ENCOUNTER — Encounter
Admission: RE | Admit: 2010-08-05 | Discharge: 2010-08-05 | Payer: Self-pay | Source: Home / Self Care | Attending: Sports Medicine | Admitting: Sports Medicine

## 2010-08-05 ENCOUNTER — Ambulatory Visit: Payer: Self-pay | Admitting: Sports Medicine

## 2010-08-05 DIAGNOSIS — M2170 Unequal limb length (acquired), unspecified site: Secondary | ICD-10-CM | POA: Insufficient documentation

## 2010-08-05 DIAGNOSIS — R269 Unspecified abnormalities of gait and mobility: Secondary | ICD-10-CM | POA: Insufficient documentation

## 2010-08-06 ENCOUNTER — Telehealth (INDEPENDENT_AMBULATORY_CARE_PROVIDER_SITE_OTHER): Payer: Self-pay | Admitting: *Deleted

## 2010-09-06 ENCOUNTER — Telehealth: Payer: Self-pay | Admitting: *Deleted

## 2010-09-09 ENCOUNTER — Ambulatory Visit: Admission: RE | Admit: 2010-09-09 | Discharge: 2010-09-09 | Payer: Self-pay | Source: Home / Self Care

## 2010-09-09 ENCOUNTER — Ambulatory Visit
Admission: RE | Admit: 2010-09-09 | Discharge: 2010-09-09 | Payer: Self-pay | Source: Home / Self Care | Attending: Sports Medicine | Admitting: Sports Medicine

## 2010-09-09 DIAGNOSIS — H9209 Otalgia, unspecified ear: Secondary | ICD-10-CM | POA: Insufficient documentation

## 2010-09-09 DIAGNOSIS — M19079 Primary osteoarthritis, unspecified ankle and foot: Secondary | ICD-10-CM | POA: Insufficient documentation

## 2010-09-21 NOTE — Assessment & Plan Note (Signed)
Summary: Mood Disorder Clnic   Primary Care Provider:  Myrtie Soman  MD   History of Present Illness: Patrick Elliott wanted to talk about medication.  He found that 7.5 mg of Zyprexa was a "comfortable" dose but he continues with mood swings.  10 mg was better for mood swings but he suffers a difficult time with the "hangover."  Upon further questioning - it is unclear whether his mood swings were better on the 10 mg.  He continues with 600 mg of Lithium and 200 mg of Lamictal.  Discussed his appt with Dr. Raymondo Band and being place on Januvia.  He believes that, in his view,  the Zyprexa has not made his blood sugars significantly more erratic than they have been.    Allergies: 1)  Sulfamethoxazole (Sulfamethoxazole)   Impression & Recommendations:  Problem # 1:  BIPOLAR AFFECTIVE DISORDER (ICD-296.80) Zyprexa 7.5 seems to be the optimum dose from adverse effect however no benefit on target symptom of mood lability.  This is the third atypical psychotic that Patrick Elliott has not tolerated and that has not proved beneficial.  Dr. Kathrynn Running recommended trying one more and then if that is not helpful, looking at other classes of medications.  Patrick Elliott was more "tucked in" today - less energy, more focused.  Perhaps more depressed.  Saphris was discussed and Patrick Elliott expressed an interest in trying it.  Discussed how to take it and how it is absorbed.  AE were discussed.  We noted Dr. Macky Lower comments about metabolic effects and will continue to be attentive to blood sugars in follow-up.     Orders: Therapy 20-30 min- [facility] ([address])  Complete Medication List: 1)  Ambien 10 Mg Tabs (Zolpidem tartrate) .... Take 1 tablet by mouth at bedtime 2)  Lisinopril 10 Mg Tabs (Lisinopril) .... 1/2 tab daily 3)  Prilosec 20 Mg Cpdr (Omeprazole) .... Take 1 capsule by mouth once a day 4)  Cialis 20 Mg Tabs (Tadalafil) .... 1/4 tablet by mouth as needed 5)  Bystolic 5 Mg Tabs (Nebivolol hcl) .... 1/2 daily 6)  Multivitamins  Tabs (Multiple vitamin) .... Take 1 tablet daily. 7)  Triamcinolone Acetonide 0.1 % Oint (Triamcinolone acetonide) .... Aaa three times a day #one large tube 8)  Humalog 100 Unit/ml Soln (Insulin lispro (human)) .Marland KitchenMarland KitchenMarland Kitchen 15-20 u prior to breakfast, 15-20 u prior to lunch and 15-20 u prior to evening meal.  please disp with syringes and needles qs 1 mo 9)  Flomax 0.4 Mg Cp24 (Tamsulosin hcl) .Marland Kitchen.. 1 by mouth daily 10)  Simvastatin 20 Mg Tabs (Simvastatin) .... One by mouth daily 11)  Prodigy Blood Glucose Monitor W/device Kit (Blood glucose monitoring suppl) .... Dispense one meter kit 12)  Prodigy Eject Blood Glucose Strp (Glucose blood) .... Use as directed 13)  Prodigy Twist Top Lancets 28g Misc (Lancets) .... As directed 14)  Lamictal 200 Mg Tabs (Lamotrigine) .... Per dr. Kathrynn Running [facility] disorder [facility].  take one tablet daily. 15)  Lithium Carbonate 300 Mg Caps (Lithium carbonate) .... Written by dr. Kathrynn Running in mood disorder clinic. 16)  Bd Insulin Syringe Microfine 28g X 1/2" 1 Ml Misc (Insulin syringe-needle u-100) .... Use three times a day as directed.  disp qs x1 month. 17)  Zyprexa 5 Mg Tabs (Olanzapine) .... Take two at bedtime.  per dr. Kathrynn Running in mood disorder clinic. 18)  Glucagon Emergency 1 Mg Kit (Glucagon (rdna)) .... For use with sugars below 40 19)  Humalog Pen 100 Unit/ml Soln (Insulin lispro (human)) .... Use as  directed; disp qs for 60-70 units per day for one month 20)  Blood Glucose Monitor Kit (Blood glucose monitoring suppl) .... For checking sugars as directed 21)  Januvia 100 Mg Tabs (Sitagliptin phosphate) .... Once daily - same time each day. 22)  Gabapentin 300 Mg Caps (Gabapentin) .... Take 900mg  three times a day 23)  Saphris 5 Mg Subl (Asenapine maleate) .... Per package instructions.  per dr. Kathrynn Running in mood disorder clinic.  Patient Instructions: 1)  Dr. Kathrynn Running prescribed a new medication called Saphris.  Please take according to the package instructions.  If you  have any questions, you can call [phone].   2)  Please scheduled a follow-up for:  March 23rd at 11:30. Prescriptions: SAPHRIS 5 MG SUBL (ASENAPINE MALEATE) Per package instructions.  Per Dr. Kathrynn Running in [facility].  #30 x 0   Entered and Authorized by:   [name] PsyD   Signed by:   [name] PsyD on 10/21/2009   Method used:   Handwritten   RxID:   [id number]

## 2010-09-21 NOTE — Assessment & Plan Note (Signed)
Summary: F/U eo   Vital Signs:  Patient Profile:   50 Years Old Male Height:     67 inches Weight:      179.1 pounds BMI:     28.15 Temp:     98.1 degrees F oral Pulse rate:   71 / minute BP sitting:   107 / 64  (left arm) Cuff size:   regular  Pt. in pain?   no  Vitals Entered By: Garen Grams LPN (May 23, 2008 2:44 PM)                Vision Comments: 03/2009   Chief Complaint:  f/u visit DM, HTN, and HLD.  History of Present Illness: 49yr old pleasant WM present to discuss the following:  1) DM - Taking Lantus qam and ssi.  Brings food record and CBG log (checks 6xday).  Mostly running 130-150s.  Some hypoglycemia in the afternoon unrelated to eating. Has dropped lantus according. Wants to meet with nutritionist as he feels very discouraged at the "randomness" of his sugars despite eating the same thing. Eye exam was normal on March 21 2008. Check feet nightly. He does complain of tingling in his feet.   2) HTN - Lisinopril dropped to 10mg  by cards. Otherwise tolerating meds well. Denies CP, SOB, DOE, orthopnea, lightheadeness, or peripheral edema.  3) HLD - Direct LDL was 127 last check. No statin initiated. Already attempts low fat diet. Would like to discuss with nutrition.    Updated Prior Medication List: AMBIEN 10 MG TABS (ZOLPIDEM TARTRATE) Take 1 tablet by mouth at bedtime HUMALOG 100 UNIT/ML SOLN (INSULIN LISPRO (HUMAN)) 5 units with meals LANTUS 100 UNIT/ML SOLN (INSULIN GLARGINE) Inject 38 unit subcutaneously daily LISINOPRIL 20 MG TABS (LISINOPRIL) Take 1 tablet by mouth once a day PRILOSEC 20 MG CPDR (OMEPRAZOLE) Take 1 capsule by mouth once a day VIAGRA 25 MG TABS (SILDENAFIL CITRATE) Take 1 tablet once a day BYSTOLIC 5 MG  TABS (NEBIVOLOL HCL) one daily MULTIVITAMINS   TABS (MULTIPLE VITAMIN) Take 1 tablet daily. TRIAMCINOLONE ACETONIDE 0.1 %  OINT (TRIAMCINOLONE ACETONIDE) aaa three times a day #one large tube HUMALOG PEN 100 UNIT/ML  SOLN  (INSULIN LISPRO (HUMAN)) take as directed FLOMAX 0.4 MG  CP24 (TAMSULOSIN HCL) 1 by mouth daily ULTICARE INSULIN SYRINGE 28G X 1/2" 0.5 ML  MISC (INSULIN SYRINGE-NEEDLE U-100) any 0.5 cc syringe.  use as directed AMITRIPTYLINE HCL 25 MG  TABS (AMITRIPTYLINE HCL) one by mouth qam and two by mouth qpm SIMVASTATIN 20 MG TABS (SIMVASTATIN) one by mouth daily  Current Allergies (reviewed today): SULFAMETHOXAZOLE (SULFAMETHOXAZOLE)  Past Medical History:    Reviewed history from 05/18/2007 and no changes required:       acl repair 87       ICU with ARDSs UGIB 04/2006       Rods in both legs s/p removal       DM II insulin dependant       Tachycardai       Insomnia       Erectile dysfunction       hepatic cirrhosis with ascites       anemia       h/o polysubstance abuse - some residual memory loss   Social History:    Reviewed history from 04/05/2007 and no changes required:       Has used every drug in the book except IV ones but quit 2004.  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  Quit tob 04/2006.  Sexually active, uses condoms.    Review of Systems  The patient denies anorexia, abdominal pain, depression, and unusual weight change.     Physical Exam  General:     Well-developed,well-nourished,in no acute distress; alert,appropriate and cooperative throughout examination Mouth:     MMM Lungs:     Normal respiratory effort, chest expands symmetrically. Lungs are clear to auscultation, no crackles or wheezes. Heart:     Normal rate and regular rhythm. S1 and S2 normal without gallop, murmur, click, rub or other extra sounds. Pulses:     2+ radial and pedal pulses Extremities:     no edema Neurologic:     alert & oriented X3 and gait normal.   Psych:     Oriented X3, memory intact for recent and remote, normally interactive, good eye contact, and not anxious appearing.  Joking.   Diabetes Management Exam:    Foot Exam (with socks and/or shoes not present):        Sensory-Pinprick/Light touch:          Left medial foot (L-4): normal          Left dorsal foot (L-5): normal          Left lateral foot (S-1): normal          Right medial foot (L-4): normal          Right dorsal foot (L-5): normal          Right lateral foot (S-1): normal       Sensory-Monofilament:          Left foot: normal          Right foot: normal       Inspection:          Left foot: normal          Right foot: normal       Nails:          Left foot: normal          Right foot: normal    Eye Exam:       Eye Exam done elsewhere          Date: 03/21/2008          Results: normal    Impression & Recommendations:  Problem # 1:  DIABETES MELLITUS II, UNCOMPLICATED (ICD-250.00) Assessment: Unchanged A1C still at goal. Labs today. Foot exam done. Eye exam UTD. Flu shot. FU in nutrition clinic for help with diet choices.   His updated medication list for this problem includes:    Humalog 100 Unit/ml Soln (Insulin lispro (human)) .Marland KitchenMarland KitchenMarland KitchenMarland Kitchen 5 units with meals    Lantus 100 Unit/ml Soln (Insulin glargine) ..... Inject 38 unit subcutaneously daily    Lisinopril 20 Mg Tabs (Lisinopril) .Marland Kitchen... Take 1 tablet by mouth once a day    Humalog Pen 100 Unit/ml Soln (Insulin lispro (human)) .Marland Kitchen... Take as directed  Orders: A1C-[facility] (19147) UA Microalbumin-[facility] (82044) [facility]- Est  Level 4 (82956)   Problem # 2:  HYPERTENSION, BENIGN ESSENTIAL (ICD-401.1) Assessment: Unchanged Continue current meds.   His updated medication list for this problem includes:    Lisinopril 20 Mg Tabs (Lisinopril) .Marland Kitchen... Take 1 tablet by mouth once a day    Bystolic 5 Mg Tabs (Nebivolol hcl) ..... One daily  Orders: [facility]- Est  Level 4 (21308)   Problem # 3:  HYPERLIPIDEMIA (ICD-272.4) Assessment: Deteriorated Diet thus far not working. Start statin. FU nutrition clinic for other  suggestions.  His updated medication list for this problem includes:    Simvastatin 20 Mg Tabs (Simvastatin) ..... One by mouth  daily  Orders: Comp Met-[facility] [id number]) Direct LDL-[facility] [phone]) [facility]- Est  Level 4 (27253)   Complete Medication List: 1)  Ambien 10 Mg Tabs (Zolpidem tartrate) .... Take 1 tablet by mouth at bedtime 2)  Humalog 100 Unit/ml Soln (Insulin lispro (human)) .... 5 units with meals 3)  Lantus 100 Unit/ml Soln (Insulin glargine) .... Inject 38 unit subcutaneously daily 4)  Lisinopril 20 Mg Tabs (Lisinopril) .... Take 1 tablet by mouth once a day 5)  Prilosec 20 Mg Cpdr (Omeprazole) .... Take 1 capsule by mouth once a day 6)  Viagra 25 Mg Tabs (Sildenafil citrate) .... Take 1 tablet once a day 7)  Bystolic 5 Mg Tabs (Nebivolol hcl) .... One daily 8)  Multivitamins Tabs (Multiple vitamin) .... Take 1 tablet daily. 9)  Triamcinolone Acetonide 0.1 % Oint (Triamcinolone acetonide) .... Aaa three times a day #one large tube 10)  Humalog Pen 100 Unit/ml Soln (Insulin lispro (human)) .... Take as directed 11)  Flomax 0.4 Mg Cp24 (Tamsulosin hcl) .Marland Kitchen.. 1 by mouth daily 12)  Ulticare Insulin Syringe 28g X 1/2" 0.5 Ml Misc (Insulin syringe-needle u-100) .... Any 0.5 cc syringe.  use as directed 13)  Amitriptyline Hcl 25 Mg Tabs (Amitriptyline hcl) .... One by mouth qam and two by mouth qpm 14)  Simvastatin 20 Mg Tabs (Simvastatin) .... One by mouth daily  Other Orders: B12-[facility] ([id number])   Patient Instructions: 1)  Appointment with Dr Gerilyn Pilgrim and Dr Clelia Croft in nutrition clinic 10/22 at 4pm. Bring your food records and sugar record. 2)  NEW MEDICINE: Zocor (simvastatin) 20mg  by mouth daily for cholesterol. 3)  No other med changes right now. 4)  Follow up with Dr Clelia Croft in 3 months or sooner if the leg pain is continuing to bother you.    Prescriptions: SIMVASTATIN 20 MG TABS (SIMVASTATIN) one by mouth daily  #30 x 6   Entered and Authorized by:   Lupita Raider MD   Signed by:   Lupita Raider MD on 05/23/2008   Method used:   Electronically to        [id number] * (retail)        [address], [place]  [address]       Ph: [phone] or [phone]       Fax: [phone]   RxID:   [id number] AMBIEN 10 MG TABS (ZOLPIDEM TARTRATE) Take 1 tablet by mouth at bedtime  #30 x 0   Entered and Authorized by:   Lupita Raider MD   Signed by:   Lupita Raider MD on 05/23/2008   Method used:   Print then Give to Patient   RxID:   [id number]  ] Laboratory Results   Blood Tests   Date/Time Received: May 23, 2008 2:58 PM  Date/Time Reported: May 23, 2008 3:16 PM   HGBA1C: 6.8%   (Normal Range: Non-Diabetic - 3-6%   Control Diabetic - 6-8%)  Comments: ...........test performed by...........Marland KitchenTerese Door, CMA      Appended Document: F/U eo   Appended Document: F/U eo   Influenza Vaccine    Vaccine Type: Fluvax 3+    Site: left deltoid    Mfr: GlaxoSmithKline    Dose: 0.5 ml    Route: IM  Given by: ASHA BENTON LPN    Exp. Date: 02/18/2009    Lot #: [id number]    VIS given: 03/15/07 version given May 23, 2008.  Flu Vaccine Consent Questions    Do you have a history of severe allergic reactions to this vaccine? no    Any prior history of allergic reactions to egg and/or gelatin? no    Do you have a sensitivity to the preservative Thimersol? no    Do you have a past history of Guillan-Barre Syndrome? no    Do you currently have an acute febrile illness? no    Have you ever had a severe reaction to latex? no    Vaccine information given and explained to patient? yes

## 2010-09-21 NOTE — Progress Notes (Signed)
Summary: Rx  Phone Note Refill Request Call back at [phone]   Refills Requested: Medication #1:  PRILOSEC 20 MG CPDR Take 1 capsule by mouth once a day  Medication #2:  JANUVIA 100 MG TABS once daily - same time each day.  Medication #3:  TRAMADOL HCL 50 MG TABS one tab by mouth every 6 hours as needed for headache [facility]  Initial call taken by: Knox Royalty,  April 05, 2010 10:40 AM  Follow-up for Phone Call       Follow-up by: Golden Circle RN,  April 05, 2010 10:43 AM    Prescriptions: TRAMADOL HCL 50 MG TABS (TRAMADOL HCL) one tab by mouth every 6 hours as needed for headache  #60 x 0   Entered by:   Golden Circle RN   Authorized by:   Angelena Sole MD   Signed by:   Golden Circle RN on 04/05/2010   Method used:   Electronically to        [id number] ([facility]ail)       [address]       [place], [place]  [address]       Ph: [phone] or [phone]       Fax: [phone]   RxID:   [id number] JANUVIA 100 MG TABS (SITAGLIPTIN PHOSPHATE) once daily - same time each day.  #30 x 11   Entered by:   Golden Circle RN   Authorized by:   Angelena Sole MD   Signed by:   Golden Circle RN on 04/05/2010   Method used:   [facility]ically to        [id number] ([facility]ail[facility]       [address]       [place], [place]  [address]       Ph: [phone] or [phone]       Fax: [phone]   RxID:   [id number] PRILOSEC 20 MG CPDR (OMEPRAZOLE) Take 1 capsule by mouth once a day  #3 x 11   Entered by:   Golden Circle RN   Authorized by:   Angelena Sole MD   Signed by:   Golden Circle RN on 04/05/2010   Method used:   [facility]        [id number] ([facility]ail)       [address]       [place], [place]  [address]       Ph: [phone] or [phone]       Fax: [phone]   RxID:   [id number]  0

## 2010-09-21 NOTE — Assessment & Plan Note (Signed)
Summary: pink eye per pt/[place]/[facility]   Vital Signs:  Patient profile:   50 year old male Height:      69 inches Weight:      157.9 pounds BMI:     23.40 Temp:     97.6 degrees F oral Pulse rate:   71 / minute BP sitting:   150 / 81  (left arm) Cuff size:   regular  Vitals Entered By: Garen Grams LPN (February 03, 2010 3:24 PM) CC: right eye itchy and irritated x 1 week Is Patient Diabetic? No Pain Assessment Patient in pain? no        Primary Care Provider:  Angelena Sole MD  CC:  right eye itchy and irritated x 1 week.  History of Present Illness: R eye: for past week approxiamtely he's had itching in his right eye adn it has been irritated as if something is in it.  denies being around anything that could have actually gotten something in it.  he's been using OTC saline drops without much relief long term.  he has noticed a little mild crusting the last day or two.  he desnies contact with anyone who's had pink eye that he knowsof rececently.  he denies runny nose, he denies cough.  he doesn't have a history of allergies or eye problems other than a remote history of a stye in his eye  Habits & Providers  Alcohol-Tobacco-Diet     Tobacco Status: never  Current Medications (verified): 1)  Ambien 10 Mg Tabs (Zolpidem Tartrate) .... Take 1 Tablet By Mouth At Bedtime 2)  Lisinopril 10 Mg  Tabs (Lisinopril) .... 1/2 Tab Daily 3)  Prilosec 20 Mg Cpdr (Omeprazole) .... Take 1 Capsule By Mouth Once A Day 4)  Cialis 20 Mg Tabs (Tadalafil) .... 1/4 Tablet By Mouth As Needed 5)  Bystolic 5 Mg  Tabs (Nebivolol Hcl) .... 1/2 Daily 6)  Multivitamins   Tabs (Multiple Vitamin) .... Take 1 Tablet Daily. 7)  Triamcinolone Acetonide 0.1 %  Oint (Triamcinolone Acetonide) .... Aaa Three Times A Day #one Large Tube 8)  Humalog 100 Unit/ml Soln (Insulin Lispro (Human)) .Marland KitchenMarland KitchenMarland Kitchen 15-20 U Prior To Breakfast, 15-20 U Prior To Lunch and 15-20 U Prior To Evening Meal.  Please Disp With Syringes and  Needles Qs 1 Mo 9)  Flomax 0.4 Mg  Cp24 (Tamsulosin Hcl) .Marland Kitchen.. 1 By Mouth Daily 10)  Simvastatin 20 Mg Tabs (Simvastatin) .... One By Mouth Daily 11)  Prodigy Blood Glucose Monitor W/device Kit (Blood Glucose Monitoring Suppl) .... Dispense One Meter Kit 12)  Prodigy Eject Blood Glucose  Strp (Glucose Blood) .... Use As Directed 13)  Prodigy Twist Top Lancets 28g  Misc (Lancets) .... As Directed 14)  Lamictal 200 Mg Tabs (Lamotrigine) .... Per Dr. Kathrynn Running [facility].  Take One Tablet Daily. 15)  Lithium Carbonate 300 Mg Caps (Lithium Carbonate) .... Written By Dr. Kathrynn Running in [facility]. 16)  Bd Insulin Syringe Microfine 28g X 1/2" 1 Ml Misc (Insulin Syringe-Needle U-100) .... Use Three Times A Day As Directed.  Disp Qs X1 Month. 17)  Glucagon Emergency 1 Mg Kit (Glucagon (Rdna)) .... For Use With Sugars Below 40 18)  Humalog Pen 100 Unit/ml Soln (Insulin Lispro (Human)) .... Use As Directed; Disp Qs For 60-70 Units Per Day For One Month 19)  Blood Glucose Monitor  Kit (Blood Glucose Monitoring Suppl) .... For Checking Sugars As Directed 20)  Januvia 100 Mg Tabs (Sitagliptin Phosphate) .... Once Daily - Same  Time Each Day. 21)  Gabapentin 300 Mg Caps (Gabapentin) .... Take 900mg  Three Times A Day 22)  Tramadol Hcl 50 Mg Tabs (Tramadol Hcl) .... One Tab By Mouth Every 6 Hours As Needed For Headache 23)  Depakote Er 250 Mg Tb24 (Divalproex Sodium (Migraine)) .... Take One Daily With Food.  Per Dr. Kathrynn Running in [facility].  Allergies (verified): 1)  Sulfamethoxazole (Sulfamethoxazole)  Past History:  PMH reviewed for relevance  Review of Systems       per HPI  Physical Exam  General:  vital signs reviewed and normal Alert, appropriate; well-dressed and well-nourished  Head:  Normocephalic and atraumatic without obvious abnormalities. No apparent alopecia or balding. Eyes:  vision grossly normal. PERRL.  EOMI.  conjunctiva mildly erythematous at outermost  areas.  no matting or crusting noted.  no excessive tearing.  no foreign bodies noted.     Impression & Recommendations:  Problem # 1:  ALLERGIC CONJUNCTIVITIS (ICD-372.14) Assessment New  history more consistent with allergic conjunctivitis - will rx with patanol gtts, return if worsens.  no red flags on examination today.   Orders: [facility]- Est Level  3 (81191)  Complete Medication List: 1)  Ambien 10 Mg Tabs (Zolpidem tartrate) .... Take 1 tablet by mouth at bedtime 2)  Lisinopril 10 Mg Tabs (Lisinopril) .... 1/2 tab daily 3)  Prilosec 20 Mg Cpdr (Omeprazole) .... Take 1 capsule by mouth once a day 4)  Cialis 20 Mg Tabs (Tadalafil) .... 1/4 tablet by mouth as needed 5)  Bystolic 5 Mg Tabs (Nebivolol hcl) .... 1/2 daily 6)  Multivitamins Tabs (Multiple vitamin) .... Take 1 tablet daily. 7)  Triamcinolone Acetonide 0.1 % Oint (Triamcinolone acetonide) .... Aaa three times a day #one large tube 8)  Humalog 100 Unit/ml Soln (Insulin lispro (human)) .Marland KitchenMarland KitchenMarland Kitchen 15-20 u prior to breakfast, 15-20 u prior to lunch and 15-20 u prior to evening meal.  please disp with syringes and needles qs 1 mo 9)  Flomax 0.4 Mg Cp24 (Tamsulosin hcl) .Marland Kitchen.. 1 by mouth daily 10)  Simvastatin 20 Mg Tabs (Simvastatin) .... One by mouth daily 11)  Prodigy Blood Glucose Monitor W/device Kit (Blood glucose monitoring suppl) .... Dispense one meter kit 12)  Prodigy Eject Blood Glucose Strp (Glucose blood) .... Use as directed 13)  Prodigy Twist Top Lancets 28g Misc (Lancets) .... As directed 14)  Lithium Carbonate 300 Mg Caps (Lithium carbonate) .... Written by dr. Kathrynn Running in mood disorder clinic. 15)  Bd Insulin Syringe Microfine 28g X 1/2" 1 Ml Misc (Insulin syringe-needle u-100) .... Use three times a day as directed.  disp qs x1 month. 16)  Glucagon Emergency 1 Mg Kit (Glucagon (rdna)) .... For use with sugars below 40 17)  Humalog Pen 100 Unit/ml Soln (Insulin lispro (human)) .... Use as directed; disp qs for 60-70 units  per day for one month 18)  Blood Glucose Monitor Kit (Blood glucose monitoring suppl) .... For checking sugars as directed 19)  Januvia 100 Mg Tabs (Sitagliptin phosphate) .... Once daily - same time each day. 20)  Gabapentin 300 Mg Caps (Gabapentin) .... Take 900mg  three times a day 21)  Tramadol Hcl 50 Mg Tabs (Tramadol hcl) .... One tab by mouth every 6 hours as needed for headache 22)  Depakote Er 250 Mg Tb24 (Divalproex sodium (migraine)) .... Take two daily with food.  per dr. Kathrynn Running in mood disorder clinic. 23)  Patanol 0.1 % Soln (Olopatadine hcl) .Marland Kitchen.. 1 drop each eye two times a day until  cleared.  disp 1 bottle.  Patient Instructions: 1)  Use the eye drop as directed until the eye feels like it clears up. 2)  If things get worse or you start having pain in the eye let us know right away. Prescriptions: PATANOL 0.1 % SOLN (OLOPATADINE HCL) 1 drop each eye two times a day until cleared.  disp 1 bottle.  #1 x 0   Entered and Authorized by:   Ancil Boozer  MD   Signed by:   Ancil Boozer  MD on 02/03/2010   Method used:   Handwritten   RxID:   [id number]

## 2010-09-21 NOTE — Progress Notes (Signed)
Summary: Concern about blurry vision.  Phone Note Call from Patient   Caller: Patient Call For: [name], Psy.D. Summary of Call: Patient left a VM on Friday, May 13th while I was on vacation.  He reported (on VM) concern about "extreme blurry vision" for a few hours in the morning that started 7-10 days ago.  Reviewed labs and forwarded them to Dr. Kathrynn Running.  Dr. Kathrynn Running reported he has been trying to get in touch with [facility] all week but has been unable to.  We are scheduled to see him on Wednesday, May 18th.  I called [name] back to discuss the issue and left a VM. Initial call taken by: [name] PsyD,  Jan 04, 2010 4:07 PM

## 2010-09-21 NOTE — Progress Notes (Signed)
Summary: phone mgs  Phone Note Call from Patient Call back at [phone]   Caller: Patient Summary of Call: pt called and stated he continue with headache the medication ( sumitriptan)is not working for him. He will like to try another medic  Initial call taken by: Clydell Hakim,  June 04, 2010 11:06 AM  Follow-up for Phone Call        Please have pt come in for an office visit to discuss Follow-up by: Angelena Sole MD,  June 04, 2010 4:31 PM  Additional Follow-up for Phone Call Additional follow up Details #1::        Spoke with Mr. Grimm and informed him that he needed an appt with you first.  Patient said he was scheduled tomorrow with you and will discuss med change and test results then Additional Follow-up by: Abundio Miu,  June 08, 2010 10:29 AM    Additional Follow-up for Phone Call Additional follow up Details #2::    called pt back to inform him that his appt is not until 10/26 - not tomorrow and if he needed to be seen sooner to pls call. Follow-up by: De Nurse,  June 08, 2010 11:07 AM

## 2010-09-21 NOTE — Assessment & Plan Note (Signed)
Summary: [facility] appt   Primary Care Provider:  Angelena Sole MD   History of Present Illness: Reviewed labwork with Patrick Elliott.  Dr. Kathrynn Running thinks it looks reasonable - low end of normal but would like to keep it there.  Patrick Elliott reports continued racing thoughts and up and down moods with a fair amount of irritability.  Still having headaches.  He reports twitching.  In terms of function, he reports being very productive in the past few weeks to a month.    Allergies: 1)  Sulfamethoxazole (Sulfamethoxazole)   Impression & Recommendations:  Problem # 1:  BIPOLAR AFFECTIVE DISORDER (ICD-296.80) Report of mood is irritable but later he reports that things are good right now.  Affect is within normal limits.  Thoughts are clear and goal directed.  Dr. Kathrynn Running recommended staying the current course for several months to determine efficacy over time.  Function remains good, according to Patrick Elliott's report.  Patrick Elliott reports liking the idea of keeping everything the same right now.  The fact he volunteered that things were "good" is a good signl.  Discussed potentially adding something to improve mental focus but will not address until we can see what the mood stabilizers do over time.   Will follow up in one month.  Blood draws in between.   Orders: Therapy 20-30 min- [facility] ([address])  Complete Medication List: 1)  Ambien 10 Mg Tabs (Zolpidem tartrate) .... Take 1 tablet by mouth at bedtime 2)  Lisinopril 10 Mg Tabs (Lisinopril) .... 1/2 tab daily 3)  Prilosec 20 Mg Cpdr (Omeprazole) .... Take 1 capsule by mouth once a day 4)  Cialis 20 Mg Tabs (Tadalafil) .... 1/4 tablet by mouth as needed 5)  Bystolic 5 Mg Tabs (Nebivolol hcl) .... 1/2 daily 6)  Multivitamins Tabs (Multiple vitamin) .... Take 1 tablet daily. 7)  Triamcinolone Acetonide 0.1 % Oint (Triamcinolone acetonide) .... Aaa three times a day #one large tube 8)  Humalog 100 Unit/ml Soln (Insulin lispro (human)) .Marland KitchenMarland KitchenMarland Kitchen 15-20 u prior to breakfast,  15-20 u prior to lunch and 15-20 u prior to evening meal.  please disp with syringes and needles qs 1 mo 9)  Flomax 0.4 Mg Cp24 (Tamsulosin hcl) .Marland Kitchen.. 1 by mouth daily 10)  Simvastatin 20 Mg Tabs (Simvastatin) .... One by mouth daily 11)  Prodigy Blood Glucose Monitor W/device Kit (Blood glucose monitoring suppl) .... Dispense one meter kit 12)  Prodigy Eject Blood Glucose Strp (Glucose blood) .... Use as directed 13)  Prodigy Twist Top Lancets 28g Misc (Lancets) .... As directed 14)  Lithium Carbonate 300 Mg Caps (Lithium carbonate) .... Written by dr. Kathrynn Running in [facility]. 15)  Bd Insulin Syringe Microfine 28g X 1/2" 1 Ml Misc (Insulin syringe-needle u-100) .... Use three times a day as directed.  disp qs x1 month. 16)  Glucagon Emergency 1 Mg Kit (Glucagon (rdna)) .... For use with sugars below 40 17)  Humalog Pen 100 Unit/ml Soln (Insulin lispro (human)) .... Use as directed; disp qs for 60-70 units per day for one month 18)  Blood Glucose Monitor Kit (Blood glucose monitoring suppl) .... For checking sugars as directed 19)  Januvia 100 Mg Tabs (Sitagliptin phosphate) .... Once daily - same time each day. 20)  Gabapentin 300 Mg Caps (Gabapentin) .... Take 900mg  three times a day 21)  Tramadol Hcl 50 Mg Tabs (Tramadol hcl) .... One tab by mouth every 6 hours as needed for headache 22)  Depakote Er 250 Mg Tb24 (Divalproex sodium (migraine)) .... Take  two daily with food.  per dr. Kathrynn Running in [facility]. 23)  Patanol 0.1 % Soln (Olopatadine hcl) .Marland Kitchen.. 1 drop each eye two times a day until cleared.  disp 1 bottle.  Patient Instructions: 1)  Please schedule a follow up on August 24th at 11:30. 2)  Please continue with the blood draws every two weeks. 3)  Call with questions or concerns. 4)  Good to see you and [name] today.

## 2010-09-21 NOTE — Assessment & Plan Note (Signed)
Summary: f/u eo   Vital Signs:  Patient profile:   50 year old male Height:      69 inches Weight:      158.6 pounds BMI:     23.51 Temp:     97.6 degrees F oral Pulse rate:   64 / minute BP sitting:   112 / 64  (left arm) Cuff size:   regular  Vitals Entered By: Garen Grams LPN (November 11, 2009 2:15 PM) CC: f/u Is Patient Diabetic? Yes Pain Assessment Patient in pain? yes     Location: headache   Primary Care Provider:  Myrtie Soman  MD  CC:  f/u.  History of Present Illness: 1. Diabetes taking medications: yes, recently started Januvia **takes Humalog with meals problems with medications?: no; blood sugar testing frequency: 6-8 times per day hypoglycemic events?: no subjective: last A1c 6.0 09/23/09  ROS chest pain:  states he has an occasional "pull" across chest after taking medications   shortness of breath: no   polyuria: urinates  ~ 3 times per night    polydipsia: no    problems with feet: no   other: has HA most mornings with awakening.  **normal nuclear stress test 6/10  2. bipolar disorder Pt reports persistent tremor and balance problems. Lithium level was 1.75 on labs checked yesterday. Seen in [facility] by Dr. Kathrynn Running and Pascal Lux today. Lithium cut in half and Safris (atypical antipsychotic) stopped. Changes made with the goal of minimizing side effects.   3. shoulder pain / numbness Has pain and numbness that radiates down left arm into hand 2-3 times per day over the last 2-3 weeks. Has a history of peripheral neuropathy. Notes numbness across all fingers. Has had some trouble with butting shirts and fine motor skills on that side. Activities that involve abduction and internal rotation seem to trigger his symptoms the most.   Current Medications (verified): 1)  Ambien 10 Mg Tabs (Zolpidem Tartrate) .... Take 1 Tablet By Mouth At Bedtime 2)  Lisinopril 10 Mg  Tabs (Lisinopril) .... 1/2 Tab Daily 3)  Prilosec 20 Mg Cpdr (Omeprazole) .... Take 1 Capsule By Mouth  Once A Day 4)  Cialis 20 Mg Tabs (Tadalafil) .... 1/4 Tablet By Mouth As Needed 5)  Bystolic 5 Mg  Tabs (Nebivolol Hcl) .... 1/2 Daily 6)  Multivitamins   Tabs (Multiple Vitamin) .... Take 1 Tablet Daily. 7)  Triamcinolone Acetonide 0.1 %  Oint (Triamcinolone Acetonide) .... Aaa Three Times A Day #one Large Tube 8)  Humalog 100 Unit/ml Soln (Insulin Lispro (Human)) .Marland KitchenMarland KitchenMarland Kitchen 15-20 U Prior To Breakfast, 15-20 U Prior To Lunch and 15-20 U Prior To Evening Meal.  Please Disp With Syringes and Needles Qs 1 Mo 9)  Flomax 0.4 Mg  Cp24 (Tamsulosin Hcl) .Marland Kitchen.. 1 By Mouth Daily 10)  Simvastatin 20 Mg Tabs (Simvastatin) .... One By Mouth Daily 11)  Prodigy Blood Glucose Monitor W/device Kit (Blood Glucose Monitoring Suppl) .... Dispense One Meter Kit 12)  Prodigy Eject Blood Glucose  Strp (Glucose Blood) .... Use As Directed 13)  Prodigy Twist Top Lancets 28g  Misc (Lancets) .... As Directed 14)  Lamictal 200 Mg Tabs (Lamotrigine) .... Per Dr[facility]nn [facility].  Take One Tablet Daily. 15)  Lithium Carbonate 300 Mg Caps (Lithium Carbonate) .... Written By Dr[facility]nn [facility] in [facility]. 16)  Bd Insulin Syringe Microfine 28g X 1/2" 1 Ml Misc (Insulin Syringe-Needle U-100) .... Use Three Times A Day As Directed.  Disp Qs X1  Month. 17)  Glucagon Emergency 1 Mg Kit (Glucagon (Rdna)) .... For Use With Sugars Below 40 18)  Humalog Pen 100 Unit/ml Soln (Insulin Lispro (Human)) .... Use As Directed; Disp Qs For 60-70 Units Per Day For One Month 19)  Blood Glucose Monitor  Kit (Blood Glucose Monitoring Suppl) .... For Checking Sugars As Directed 20)  Januvia 100 Mg Tabs (Sitagliptin Phosphate) .... Once Daily - Same Time Each Day. 21)  Gabapentin 300 Mg Caps (Gabapentin) .... Take 900mg  Three Times A Day  Allergies (verified): 1)  Sulfamethoxazole (Sulfamethoxazole)  Physical Exam  Additional Exam:  General:  vital signs reviewed and normal Alert, appropriate; well-dressed and well-nourished HEENT:  OP pink and moist Neck:  no tenderness or masses  Lungs:  work of breathing unlabored, clear to auscultation bilaterally; no wheezes, rales, or ronchi; good air movement throughout Heart:  regular rate and rhythm, no murmurs; normal s1/s2 Abd: +BS, soft, non-tender, non-distended; no masses; no rebound or guarding  Neurologic:  alert and oriented; speech normal. Mild resting tremor. Gait is normal with good balance. Smooth transitions.  MSK: shoulders normal to inspection. No cervical or paracervical tenderness. No bony or muscular tenderness over the shoulder. Spurling's negative. Has full forward abduction with pain and extent of this motion. Pain with empty can. Negative Hawkins. Normal internal rotation and normal lift-off. Normal external rotation.   Psych:  Cognition and judgment appear intact. Alert and cooperative with normal attention span and concentration. No apparent delusions, illusions, hallucinations. speech normal, non-pressured. No grandiosity. Affect somewhat flat.     Impression & Recommendations:  Problem # 1:  DIABETES MELLITUS II, UNCOMPLICATED (ICD-250.00)  Doing well with addition of Januvia and has been able to decresae daily insulin usage (from 60 to 45 units daily). Continue to follow for now. Follow-up in pharmacy clinic in 4 weeks.  His updated medication list for this problem includes:    Lisinopril 10 Mg Tabs (Lisinopril) .Marland Kitchen... 1/2 tab daily    Humalog 100 Unit/ml Soln (Insulin lispro (human)) .Marland KitchenMarland KitchenMarland KitchenMarland Kitchen 15-20 u prior to breakfast, 15-20 u prior to lunch and 15-20 u prior to evening meal.  please disp with syringes and needles qs 1 mo    Glucagon Emergency 1 Mg Kit (Glucagon (rdna)) .Marland Kitchen... For use with sugars below 40    Humalog Pen 100 Unit/ml Soln (Insulin lispro (human)) ..... Use as directed; disp qs for 60-70 units per day for one month    Januvia 100 Mg Tabs (Sitagliptin phosphate) ..... Once daily - same time each day.  Orders: [facility]- Est  Level 4  (21308)  Problem # 2:  BIPOLAR AFFECTIVE DISORDER (ICD-296.80)  Lithium level supratherapeutic. Possibly responsible for the patient's persistent symptoms, including tremor and balance issues. Safris stopped per Drs. Kathrynn Running and [place]. Lithium dose cut in half. Plan to recheck level soon -- will discuss further with Dr. Raymondo Band.   Orders: [facility]- Est  Level 4 (65784)  Problem # 3:  SHOULDER PAIN, LEFT (ICD-719.41)  fairly acute in presentation. Unsure if this represents extension of peripheral neuropathy or new pathology. Will give rotator cuff exercises to improve ROM and strength. Consider increasing neurontin but given other issues will hold off for now. Pt agreeable with this plan.   Orders: [facility]- Est  Level 4 (69629)  Problem # 4:  HYPERTENSION, BENIGN ESSENTIAL (ICD-401.1) Assessment: Comment Only stable. continue current regimen.  His updated medication list for this problem includes:    Lisinopril 10 Mg Tabs (Lisinopril) .Marland Kitchen... 1/2 tab daily  Bystolic 5 Mg Tabs (Nebivolol hcl) .Marland Kitchen... 1/2 daily  Complete Medication List: 1)  Ambien 10 Mg Tabs (Zolpidem tartrate) .... Take 1 tablet by mouth at bedtime 2)  Lisinopril 10 Mg Tabs (Lisinopril) .... 1/2 tab daily 3)  Prilosec 20 Mg Cpdr (Omeprazole) .... Take 1 capsule by mouth once a day 4)  Cialis 20 Mg Tabs (Tadalafil) .... 1/4 tablet by mouth as needed 5)  Bystolic 5 Mg Tabs (Nebivolol hcl) .... 1/2 daily 6)  Multivitamins Tabs (Multiple vitamin) .... Take 1 tablet daily. 7)  Triamcinolone Acetonide 0.1 % Oint (Triamcinolone acetonide) .... Aaa three times a day #one large tube 8)  Humalog 100 Unit/ml Soln (Insulin lispro (human)) .Marland KitchenMarland KitchenMarland Kitchen 15-20 u prior to breakfast, 15-20 u prior to lunch and 15-20 u prior to evening meal.  please disp with syringes and needles qs 1 mo 9)  Flomax 0.4 Mg Cp24 (Tamsulosin hcl) .Marland Kitchen.. 1 by mouth daily 10)  Simvastatin 20 Mg Tabs (Simvastatin) .... One by mouth daily 11)  Prodigy Blood Glucose Monitor  W/device Kit (Blood glucose monitoring suppl) .... Dispense one meter kit 12)  Prodigy Eject Blood Glucose Strp (Glucose blood) .... Use as directed 13)  Prodigy Twist Top Lancets 28g Misc (Lancets) .... As directed 14)  Lamictal 200 Mg Tabs (Lamotrigine) .... Per dr. Kathrynn Running [facility].  take one tablet daily. 15)  Lithium Carbonate 300 Mg Caps (Lithium carbonate) .... Written by dr. Kathrynn Running in [facility]. 16)  Bd Insulin Syringe Microfine 28g X 1/2" 1 Ml Misc (Insulin syringe-needle u-100) .... Use three times a day as directed.  disp qs x1 month. 17)  Glucagon Emergency 1 Mg Kit (Glucagon (rdna)) .... For use with sugars below 40 18)  Humalog Pen 100 Unit/ml Soln (Insulin lispro (human)) .... Use as directed; disp qs for 60-70 units per day for one month 19)  Blood Glucose Monitor Kit (Blood glucose monitoring suppl) .... For checking sugars as directed 20)  Januvia 100 Mg Tabs (Sitagliptin phosphate) .... Once daily - same time each day. 21)  Gabapentin 300 Mg Caps (Gabapentin) .... Take 900mg  three times a day  Patient Instructions: 1)  follow-up with Dr. Raymondo Band in the pharmacy clinic in 4 weeks, then with me in 8 weeks. 2)  let me know if your headaches aren't better in the next 2 weeks.  3)  do the exercises for your shoulder.    Prevention & Chronic Care Immunizations   Influenza vaccine: Fluvax 3+  (05/29/2009)   Influenza vaccine due: 10/02/2009    Tetanus booster: 05/22/2006: Done.   Tetanus booster due: 05/22/2016    Pneumococcal vaccine: Not documented  Other Screening   Smoking status: never  (05/29/2009)  Diabetes Mellitus   HgbA1C: 6.0  (09/23/2009)   Hemoglobin A1C due: 12/11/2008    Eye exam: normal  (03/21/2008)   Eye exam due: 03/21/2009    Foot exam: yes  (05/29/2009)   Foot exam action/deferral: Do today   High risk foot: Not documented   Foot care education: completed  (10/02/2008)   Foot exam due: 05/23/2009    Urine  microalbumin/creatinine ratio: Not documented   Urine microalbumin/cr due: 05/23/2009    Diabetes flowsheet reviewed?: Yes   Progress toward A1C goal: Unchanged  Lipids   Total Cholesterol: Not documented   LDL: Not documented   LDL Direct: 79  (05/29/2009)   HDL: Not documented   Triglycerides: Not documented    SGOT (AST): 15  (05/29/2009)   SGPT (ALT):  15  (05/29/2009)   Alkaline phosphatase: 39  (05/29/2009)   Total bilirubin: 1.1  (05/29/2009)    Lipid flowsheet reviewed?: Yes   Progress toward LDL goal: Unchanged  Hypertension   Last Blood Pressure: 112 / 64  (11/11/2009)   Serum creatinine: 1.13  (05/29/2009)   Serum potassium 4.2  (05/29/2009)    Hypertension flowsheet reviewed?: Yes   Progress toward BP goal: At goal  Self-Management Support :   Personal Goals (by the next clinic visit) :     Personal A1C goal: 7  (05/29/2009)     Personal blood pressure goal: 130/80  (05/29/2009)     Personal LDL goal: 70  (05/29/2009)    Diabetes self-management support: Written self-care plan  (05/29/2009)    Diabetes self-management support not done because: Good outcomes  (09/23/2009)   Last diabetes self-management training by diabetes educator: completed    Hypertension self-management support: Written self-care plan  (05/29/2009)    Hypertension self-management support not done because: Good outcomes  (11/11/2009)    Lipid self-management support: Written self-care plan  (05/29/2009)     Lipid self-management support not done because: Good outcomes  (11/11/2009)   Appended Document: Orders Update  will recheck lithium in 2-3 weeks.  Clinical Lists Changes  Orders: Added new Test order of T-Lithium Level [phone]) - Signed Added new Test order of Basic Met-[facility] [phone]) - Signed

## 2010-09-21 NOTE — Assessment & Plan Note (Signed)
Summary: Follow-up [facility]   Primary Care Provider:  Lupita Raider MD   History of Present Illness: Our agenda is to check on tolerability and benefit of increased level of Zyprexa.  Patrick Elliott's list includes night sweats that are consistent for 3-4 weeks. Numbness in hands and feets (clumsiness). Tremors. Terrible moods - peaks and valleys. "Hangover effect" has gotten better. Memory + / -. Awoke with eye problem - feeling stuck.  The "shakiness" bothers him the most but the night sweats are troublesome because it requires him to strip his bed everyday.  He is soaking through the mattress pad.    Current dose of Lithium is 600 mg.  Current dose of Lamictal is 100 mg.  Current dose of Zyprexa is 7.5 mg.    Allergies: 1)  Sulfamethoxazole (Sulfamethoxazole)   Impression & Recommendations:  Problem # 1:  BIPOLAR AFFECTIVE DISORDER (ICD-296.80) Patrick Elliott appears flatter than past visits.  Less well groomed.  More frustrated.  Certainly more symptom focused.  No evidence of SI / HI.  HIs partner is with him today.  Unclear whether zyprexa is responsible for symptoms.  Dr. Kathrynn Running recommended going down to 5 mg to see if we can determine efficacy and  tolerability.  Night sweats might be diabetes related.  Encouraged him to check blood sugars during night sweats to ensure that hypoglycemia is not the cause.  Will follow in one month (Patrick Elliott's request).  Complete Medication List: 1)  Ambien 10 Mg Tabs (Zolpidem tartrate) .... Take 1 tablet by mouth at bedtime 2)  Lisinopril 10 Mg Tabs (Lisinopril) .... 1/2 tab daily 3)  Prilosec 20 Mg Cpdr (Omeprazole) .... Take 1 capsule by mouth once a day 4)  Cialis 20 Mg Tabs (Tadalafil) .... 1/4 tablet by mouth as needed 5)  Bystolic 5 Mg Tabs (Nebivolol hcl) .... 1/2 daily 6)  Multivitamins Tabs (Multiple vitamin) .... Take 1 tablet daily. 7)  Triamcinolone Acetonide 0.1 % Oint (Triamcinolone acetonide) .... Aaa three times a day #one large tube 8)   Humalog 100 Unit/ml Soln (Insulin lispro (human)) .Marland KitchenMarland KitchenMarland Kitchen 15-20 u prior to breakfast, 15-20 u prior to lunch and 15-20 u prior to evening meal.  please disp with syringes and needles qs 1 mo 9)  Flomax 0.4 Mg Cp24 (Tamsulosin hcl) .Marland Kitchen.. 1 by mouth daily 10)  Simvastatin 20 Mg Tabs (Simvastatin) .... One by mouth daily 11)  Prodigy Blood Glucose Monitor W/device Kit (Blood glucose monitoring suppl) .... Dispense one meter kit 12)  Prodigy Eject Blood Glucose Strp (Glucose blood) .... Use as directed 13)  Prodigy Twist Top Lancets 28g Misc (Lancets) .... As directed 14)  Gabapentin 300 Mg Caps (Gabapentin) .Marland Kitchen.. 1-2 tablets three times a day 15)  Lamictal 200 Mg Tabs (Lamotrigine) .... Per dr. Kathrynn Running [facility].  take one tablet daily. 16)  Lithium Carbonate 300 Mg Caps (Lithium carbonate) .... Written by dr. Kathrynn Running in [facility]. 17)  Bd Insulin Syringe Microfine 28g X 1/2" 1 Ml Misc (Insulin syringe-needle u-100) .... Use three times a day as directed.  disp qs x1 month. 18)  Zyprexa 5 Mg Tabs (Olanzapine) .... Take two at bedtime.  per dr. Kathrynn Running in [facility].  Patient Instructions: 1)  Please schedule a follow-up for: March 2nd at 11:00. 2)  Dr. Kathrynn Running is recommending some dosing flexibility with your Zyprexa.  He recommends going down on the Zyprexa to 5 mg to see if the symptoms you are having get better.  If they don't and  you want to see if Zyprexa is beneficial at 10 mg, you can attempt that. 3)  Please check your blood sugars at night and let us know. Prescriptions: ZYPREXA 5 MG TABS (OLANZAPINE) Take two at bedtime.  Per Dr[facility] Kath[facility]nn [facility] in [facility].  #60 x 1   Entered and Authorized by:   [name] PsyD   Signed by:   [name] PsyD on 09/23/2009   Method used:   Handwritten   RxID:   [id number] LITHIUM CARBONATE 300 MG CAPS (LITHIUM CARBONATE) Written by Dr. Kath[facility]nn [facility] in [facility].  #60 x 11   Entered and Authorized by:    [name] PsyD   Signed by:   [name] PsyD on 09/23/2009   Method used:   Handwritten   RxID:   [id number]   Appended Document: Orders Update    Clinical Lists Changes  Orders: Added new Test order of Therapy 20-30 min- [facility] [phone]) - Signed

## 2010-09-21 NOTE — Progress Notes (Signed)
Summary: triage  Phone Note Call from Patient Call back at [phone]   Caller: Patient Summary of Call: Pt checking on status of prior authorization for new medication, because he is due to come back in 2 weeks for f/up. Initial call taken by: Clydell Hakim,  October 23, 2009 12:05 PM  Follow-up for Phone Call        told him it had been sent to [place] again for more info. we have provided the info. I will call him when it is approved Follow-up by: Golden [place] RN,  October 23, 2009 12:12 PM

## 2010-09-21 NOTE — Assessment & Plan Note (Signed)
Summary: [facility] follow-up   Primary Care Provider:  Myrtie Soman  MD   History of Present Illness: Reviewed concerns brought up by Dr. Raymondo Band, specifically cog-wheel rigidity and tremor.  Also, gave feedback on Li level which was high at 1.75.  He also reports difficulty with balance.  Allergies: 1)  Sulfamethoxazole (Sulfamethoxazole)   Impression & Recommendations:  Problem # 1:  BIPOLAR AFFECTIVE DISORDER (ICD-296.80)  Examination revealed cog-wheel rigidity and tremor.  Dr. Kathrynn Running instructed Patrick Elliott to stop the Saphris.  This is the third atypical antipsychotic he has tried and the second that has produced EPS.  Dr. Kathrynn Running recommends not trying any more meds in this class.  Dr. Kathrynn Running also instructed Patrick Elliott to cut his Lithium dose in half.  He was dosed keeping his medications in mind so unsure what the reason is for the increased level.  He has changed his diet and has lost 7 pounds in the last three weeks (desired).  He has taken advil too but not very many.  Dr. Kathrynn Running searched for Lithium / Saphris interactions and did not find any.  Patrick Elliott agreed with a plan to let things settle for the next three weeks and look at potential options after this time.    Orders: Therapy 20-30 min- [facility] (62952)  Complete Medication List: 1)  Ambien 10 Mg Tabs (Zolpidem tartrate) .... Take 1 tablet by mouth at bedtime 2)  Lisinopril 10 Mg Tabs (Lisinopril) .... 1/2 tab daily 3)  Prilosec 20 Mg Cpdr (Omeprazole) .... Take 1 capsule by mouth once a day 4)  Cialis 20 Mg Tabs (Tadalafil) .... 1/4 tablet by mouth as needed 5)  Bystolic 5 Mg Tabs (Nebivolol hcl) .... 1/2 daily 6)  Multivitamins Tabs (Multiple vitamin) .... Take 1 tablet daily. 7)  Triamcinolone Acetonide 0.1 % Oint (Triamcinolone acetonide) .... Aaa three times a day #one large tube 8)  Humalog 100 Unit/ml Soln (Insulin lispro (human)) .Marland KitchenMarland KitchenMarland Kitchen 15-20 u prior to breakfast, 15-20 u prior to lunch and 15-20 u prior to evening meal.  please  disp with syringes and needles qs 1 mo 9)  Flomax 0.4 Mg Cp24 (Tamsulosin hcl) .Marland Kitchen.. 1 by mouth daily 10)  Simvastatin 20 Mg Tabs (Simvastatin) .... One by mouth daily 11)  Prodigy Blood Glucose Monitor W/device Kit (Blood glucose monitoring suppl) .... Dispense one meter kit 12)  Prodigy Eject Blood Glucose Strp (Glucose blood) .... Use as directed 13)  Prodigy Twist Top Lancets 28g Misc (Lancets) .... As directed 14)  Lamictal 200 Mg Tabs (Lamotrigine) .... Per dr. Kathrynn Running [facility].  take one tablet daily. 15)  Lithium Carbonate 300 Mg Caps (Lithium carbonate) .... Written by dr. Kathrynn Running in mood disorder clinic. 16)  Bd Insulin Syringe Microfine 28g X 1/2" 1 Ml Misc (Insulin syringe-needle u-100) .... Use three times a day as directed.  disp qs x1 month. 17)  Glucagon Emergency 1 Mg Kit (Glucagon (rdna)) .... For use with sugars below 40 18)  Humalog Pen 100 Unit/ml Soln (Insulin lispro (human)) .... Use as directed; disp qs for 60-70 units per day for one month 19)  Blood Glucose Monitor Kit (Blood glucose monitoring suppl) .... For checking sugars as directed 20)  Januvia 100 Mg Tabs (Sitagliptin phosphate) .... Once daily - same time each day. 21)  Gabapentin 300 Mg Caps (Gabapentin) .... Take 900mg  three times a day 22)  Saphris 5 Mg Subl (Asenapine maleate) .... Per package instructions.  per dr. Kathrynn Running in [facility].  Patient Instructions:  1)  Please schedule a follow-up for:  April 13th at 11:30. 2)  STOP the Saphris.  It is causing side effects that are not safe. 3)  Cut the Lithium dose in half (one pill which is 300 mg).  We will recheck your level in a couple of weeks. 4)  We are sorry you have had such a tough time with your medication.  We are hoping your symptoms improve rapidly to get you some relief.

## 2010-09-21 NOTE — Assessment & Plan Note (Signed)
Summary: f/u last visit/eo   Vital Signs:  Patient profile:   50 year old male Height:      67 inches Weight:      163.6 pounds BMI:     25.72 Temp:     98.2 degrees F oral Pulse rate:   63 / minute BP sitting:   125 / 72  (left arm) Cuff size:   regular  Vitals Entered By: Garen Grams LPN (September 23, 2009 10:51 AM) CC: f/u multiple issues Is Patient Diabetic? Yes Did you bring your meter with you today? No Pain Assessment Patient in pain? yes     Location: legs   Primary Care Provider:  Myrtie Soman  MD  CC:  f/u multiple issues.  History of Present Illness: 1. night sweats going on for about 3-4 weeks. Happens every night. Wakes up soaking and cold. No weight loss. Sometimes feels like a has a fever.   2. diabetes Sugars have been erratic. Lately sugars have been 60 to 325 - review of meter corroborates this. Doesn't feel like he is aware of his glucose level according to symptoms as he has been previously.  Meter is about 82.51 years old.   3. bipolar disorder Zyprexa decreased at today's visit with Dr. Pascal Lux and Kathrynn Running to 5 to help with possible side effects including tremor, numbness, and drooling.    ROS: no chest pain, nausea, vomting, diarrhea; occasional SOB with exertion. no cough, cold symptoms. No decreased appetite or weight loss (has gained about 10 lbs since last visit in October)  Current Medications (verified): 1)  Ambien 10 Mg Tabs (Zolpidem Tartrate) .... Take 1 Tablet By Mouth At Bedtime 2)  Lisinopril 10 Mg  Tabs (Lisinopril) .... 1/2 Tab Daily 3)  Prilosec 20 Mg Cpdr (Omeprazole) .... Take 1 Capsule By Mouth Once A Day 4)  Cialis 20 Mg Tabs (Tadalafil) .... 1/4 Tablet By Mouth As Needed 5)  Bystolic 5 Mg  Tabs (Nebivolol Hcl) .... 1/2 Daily 6)  Multivitamins   Tabs (Multiple Vitamin) .... Take 1 Tablet Daily. 7)  Triamcinolone Acetonide 0.1 %  Oint (Triamcinolone Acetonide) .... Aaa Three Times A Day #one Large Tube 8)  Humalog 100 Unit/ml  Soln (Insulin Lispro (Human)) .Marland KitchenMarland KitchenMarland Kitchen 15-20 U Prior To Breakfast, 15-20 U Prior To Lunch and 15-20 U Prior To Evening Meal.  Please Disp With Syringes and Needles Qs 1 Mo 9)  Flomax 0.4 Mg  Cp24 (Tamsulosin Hcl) .Marland Kitchen.. 1 By Mouth Daily 10)  Simvastatin 20 Mg Tabs (Simvastatin) .... One By Mouth Daily 11)  Prodigy Blood Glucose Monitor W/device Kit (Blood Glucose Monitoring Suppl) .... Dispense One Meter Kit 12)  Prodigy Eject Blood Glucose  Strp (Glucose Blood) .... Use As Directed 13)  Prodigy Twist Top Lancets 28g  Misc (Lancets) .... As Directed 14)  Gabapentin 300 Mg  Caps (Gabapentin) .Marland Kitchen.. 1-2 Tablets Three Times A Day 15)  Lamictal 200 Mg Tabs (Lamotrigine) .... Per Dr[facility].  Take One Tablet Daily. 16)  Lithium Carbonate 300 Mg Caps (Lithium Carbonate) .... Written By Dr. Kath[facility]nn Running in [facility]. 17)  Bd Insulin Syringe Microfine 28g X 1/2" 1 Ml Misc (Insulin Syringe-Needle U-100) .... Use Three Times A Day As Directed.  Disp Qs X1 Month. 18)  Zyprexa 5 Mg Tabs (Olanzapine) .... Take Two At Bedtime.  Per Dr[facility]nn [facility]. 19)  Glucagon Emergency 1 Mg Kit (Glucagon (Rdna)) .... For Use With Sugars Below 40 20)  Humalog Pen 100 Unit/ml Soln (Insulin Lispro (Human)) .... Use As Directed; Disp Qs For 60-70 Units Per Day For One Month  Allergies (verified): 1)  Sulfamethoxazole (Sulfamethoxazole)  Physical Exam  Additional Exam:  General:  vital signs reviewed and normal Alert, appropriate; well-dressed and well-nourished HEENT: OP pink and moist Neck:  no tenderness or masses  Lungs:  work of breathing unlabored, clear to auscultation bilaterally; no wheezes, rales, or ronchi; good air movement throughout Heart:  regular rate and rhythm, no murmurs; normal s1/s2 Abd: +BS, soft, non-tender, non-distended; no masses; no rebound or guarding  Neurologic:  alert and oriented; speech normal. Motor intact. Gait normal.   Psych:  Cognition and  judgment appear intact. Alert and cooperative with normal attention span and concentration. No apparent delusions, illusions, hallucinations. speech normal, non-pressured. No grandiosity. Affect somewhat flat.     Impression & Recommendations:  Problem # 1:  NIGHT SWEATS (ICD-780.8) Assessment New  no weight loss. Possibly hypoglycemic episodes or due to other meds. No cough or cold symptoms to suggest TB.  No flu-like illness to suggest HIV but should keep this in mind.  Orders: [facility]- Est  Level 4 (04540)  Problem # 2:  DIABETES MELLITUS II, UNCOMPLICATED (ICD-250.00) Assessment: Unchanged A1c still shows good control. Given erratic numbers, will have pt come to pharm clinic for med evaluation. Has an unusual insulin regimen but pt is very vigiliant about monitoring this. I wonder if it may be time to work toward a more standard regimen of basal insulin with meal time injections. Appreciate pharmacy's help. Will order a new meter to see if this may have anything to do with erratic numbers.  His updated medication list for this problem includes:    Lisinopril 10 Mg Tabs (Lisinopril) .Marland Kitchen... 1/2 tab daily    Humalog 100 Unit/ml Soln (Insulin lispro (human)) .Marland KitchenMarland KitchenMarland KitchenMarland Kitchen 15-20 u prior to breakfast, 15-20 u prior to lunch and 15-20 u prior to evening meal.  please disp with syringes and needles qs 1 mo    Glucagon Emergency 1 Mg Kit (Glucagon (rdna)) .Marland Kitchen... For use with sugars below 40    Humalog Pen 100 Unit/ml Soln (Insulin lispro (human)) ..... Use as directed; disp qs for 60-70 units per day for one month  Orders: A1C-[facility] (98119) [facility]- Est  Level 4 (14782)  Problem # 3:  BIPOLAR AFFECTIVE DISORDER (ICD-296.80) Assessment: Unchanged  zyprexa per [facility]. Pt is very aware of side effects and symptoms. I advised that we can work to improve these, but it's very likely that he will have some degree of side effects that he must tolerate, given the number and type of medicines he is taking. He  expresses understanding.  Orders: [facility]- Est  Level 4 (99214)  Complete Medication List: 1)  Ambien 10 Mg Tabs (Zolpidem tartrate) .... Take 1 tablet by mouth at bedtime 2)  Lisinopril 10 Mg Tabs (Lisinopril) .... 1/2 tab daily 3)  Prilosec 20 Mg Cpdr (Omeprazole) .... Take 1 capsule by mouth once a day 4)  Cialis 20 Mg Tabs (Tadalafil) .... 1/4 tablet by mouth as needed 5)  Bystolic 5 Mg Tabs (Nebivolol hcl) .... 1/2 daily 6)  Multivitamins Tabs (Multiple vitamin) .... Take 1 tablet daily. 7)  Triamcinolone Acetonide 0.1 % Oint (Triamcinolone acetonide) .... Aaa three times a day #one large tube 8)  Humalog 100 Unit/ml Soln (Insulin lispro (human)) .Marland KitchenMarland KitchenMarland Kitchen 15-20 u prior to breakfast, 15-20 u prior to lunch and 15-20 u prior to evening meal.  please  disp with syringes and needles qs 1 mo 9)  Flomax 0.4 Mg Cp24 (Tamsulosin hcl) .Marland Kitchen.. 1 by mouth daily 10)  Simvastatin 20 Mg Tabs (Simvastatin) .... One by mouth daily 11)  Prodigy Blood Glucose Monitor W/device Kit (Blood glucose monitoring suppl) .... Dispense one meter kit 12)  Prodigy Eject Blood Glucose Strp (Glucose blood) .... Use as directed 13)  Prodigy Twist Top Lancets 28g Misc (Lancets) .... As directed 14)  Gabapentin 300 Mg Caps (Gabapentin) .Marland Kitchen.. 1-2 tablets three times a day 15)  Lamictal 200 Mg Tabs (Lamotrigine) .... Per dr. Kathrynn Running [facility].  take one tablet daily. 16)  Lithium Carbonate 300 Mg Caps (Lithium carbonate) .... Written by dr. Kathrynn Running in [facility] clinic. 17)  Bd Insulin Syringe Microfine 28g X 1/2" 1 Ml Misc (Insulin syringe-needle u-100) .... Use three times a day as directed.  disp qs x1 month. 18)  Zyprexa 5 Mg Tabs (Olanzapine) .... Take two at bedtime.  per dr. Kathrynn Running in [facility]. 19)  Glucagon Emergency 1 Mg Kit (Glucagon (rdna)) .... For use with sugars below 40 20)  Humalog Pen 100 Unit/ml Soln (Insulin lispro (human)) .... Use as directed; disp qs for 60-70 units per day for one  month 21)  Blood Glucose Monitor Kit (Blood glucose monitoring suppl) .... For checking sugars as directed  Patient Instructions: 1)  Follow-up in our diabetes clinic to go over your sugars and insulin regimen. 2)  Check your sugars at night when you wake up with sweats. 3)  Start keeping  a log of your sugars. 4)  Err on the side of letting your sugars run high, the low sugars can be dangerous. 5)  follow-up with me in 2-3 months. Prescriptions: BLOOD GLUCOSE MONITOR  KIT (BLOOD GLUCOSE MONITORING SUPPL) for checking sugars as directed  #1 x 0   Entered and Authorized by:   Myrtie Soman  MD   Signed by:   Myrtie Soman  MD on 09/23/2009   Method used:   Electronically to        [id number] * (retail)       [address]       [place], [place]  [address]       Ph: [phone] or [phone]       Fax: [phone]   RxID:   [id number] BLOOD GLUCOSE MONITOR  KIT (BLOOD GLUCOSE MONITORING SUPPL) for checking sugars as directed  #1 x 0   Entered and Authorized by:   Myrtie Soman  MD   Signed by:   Myrtie Soman  MD on 09/23/2009   Method used:   Print then Give to Patient   RxID:   [id number] HUMALOG PEN 100 UNIT/ML SOLN (INSULIN LISPRO (HUMAN)) use as directed; disp QS for 60-70 units per day for one month  #1 x 6   Entered and Authorized by:   Myrtie Soman  MD   Signed by:   Myrtie Soman  MD on 09/23/2009   Method used:   Print then Give to Patient   RxID:   [id number] GLUCAGON EMERGENCY 1 MG KIT (GLUCAGON (RDNA)) for use with sugars below 40  #1 x 1   Entered and Authorized by:   Myrtie Soman  MD   Signed by:   Myrtie Soman  MD on 09/23/2009   Method used:   Print then Give to Patient   RxID:   [id number]  Prevention & Chronic Care Immunizations   Influenza vaccine: Fluvax 3+  (05/29/2009)   Influenza vaccine due: 10/02/2009    Tetanus booster: 05/22/2006: Done.   Tetanus booster due: 05/22/2016     Pneumococcal vaccine: Not documented  Other Screening   Smoking status: never  (05/29/2009)  Diabetes Mellitus   HgbA1C: 6.0  (09/23/2009)   Hemoglobin A1C due: 12/11/2008    Eye exam: normal  (03/21/2008)   Eye exam due: 03/21/2009    Foot exam: yes  (05/29/2009)   Foot exam action/deferral: Do today   High risk foot: Not documented   Foot care education: completed  (10/02/2008)   Foot exam due: 05/23/2009    Urine microalbumin/creatinine ratio: Not documented   Urine microalbumin/cr due: 05/23/2009    Diabetes flowsheet reviewed?: Yes   Progress toward A1C goal: Unchanged  Lipids   Total Cholesterol: Not documented   LDL: Not documented   LDL Direct: 79  (05/29/2009)   HDL: Not documented   Triglycerides: Not documented    SGOT (AST): 15  (05/29/2009)   SGPT (ALT): 15  (05/29/2009)   Alkaline phosphatase: 39  (05/29/2009)   Total bilirubin: 1.1  (05/29/2009)    Lipid flowsheet reviewed?: Yes   Progress toward LDL goal: Unchanged  Hypertension   Last Blood Pressure: 125 / 72  (09/23/2009)   Serum creatinine: 1.13  (05/29/2009)   Serum potassium 4.2  (05/29/2009)    Hypertension flowsheet reviewed?: Yes   Progress toward BP goal: Unchanged  Self-Management Support :   Personal Goals (by the next clinic visit) :     Personal A1C goal: 7  (05/29/2009)     Personal blood pressure goal: 130/80  (05/29/2009)     Personal LDL goal: 70  (05/29/2009)    Diabetes self-management support: Written self-care plan  (05/29/2009)    Diabetes self-management support not done because: Good outcomes  (09/23/2009)   Last diabetes self-management training by diabetes educator: completed    Hypertension self-management support: Written self-care plan  (05/29/2009)    Hypertension self-management support not done because: Good outcomes  (09/23/2009)    Lipid self-management support: Written self-care plan  (05/29/2009)     Lipid self-management support not done because:  Good outcomes  (09/23/2009)  Laboratory Results   Blood Tests   Date/Time Received: September 23, 2009 10:49 AM  Date/Time Reported: September 23, 2009 11:10 AM   HGBA1C: 6.0%   (Normal Range: Non-Diabetic - 3-6%   Control Diabetic - 6-8%)  Comments: ...............test performed by......Marland KitchenBonnie A. [place], MLS (ASCP)cm

## 2010-09-21 NOTE — Assessment & Plan Note (Signed)
Summary: F/U Diabetes - Headaches - Tremor   Primary Care Provider:  Myrtie Soman  MD   History of Present Illness: Patient reports daily headache that continues despite D/C of Zyprexa.    Patient reports resting tremor that is at rest and AT times is severe.  Tremor is different and more extreme than that with hypoglycemia   Patient states adherence with all oral medications.   Med list updated.   Lithium 300mg  twice daily.   Has NOT missed or taken any extra doses.   Saphris   and  Januvia NEW.  Both started in last 3 weeks.   Tremor has been present for the last weeks.  Maybe before Januvia and saphris but NOT sure when tremor worsened.    Has taken advil 200mg  for headaches 2-3 days with 2-3 at a time.   Patient did not bring in BG log but startes that sugars have been between 100-225.  Since starting  the Januvia he has been able to decrease daily insulin requirment from 60 units to 45 units.  Does not complain of any hypoglycemic events      Current Medications (verified): 1)  Ambien 10 Mg Tabs (Zolpidem Tartrate) .... Take 1 Tablet By Mouth At Bedtime 2)  Lisinopril 10 Mg  Tabs (Lisinopril) .... 1/2 Tab Daily 3)  Prilosec 20 Mg Cpdr (Omeprazole) .... Take 1 Capsule By Mouth Once A Day 4)  Cialis 20 Mg Tabs (Tadalafil) .... 1/4 Tablet By Mouth As Needed 5)  Bystolic 5 Mg  Tabs (Nebivolol Hcl) .... 1/2 Daily 6)  Multivitamins   Tabs (Multiple Vitamin) .... Take 1 Tablet Daily. 7)  Triamcinolone Acetonide 0.1 %  Oint (Triamcinolone Acetonide) .... Aaa Three Times A Day #one Large Tube 8)  Humalog 100 Unit/ml Soln (Insulin Lispro (Human)) .Marland KitchenMarland KitchenMarland Kitchen 15-20 U Prior To Breakfast, 15-20 U Prior To Lunch and 15-20 U Prior To Evening Meal.  Please Disp With Syringes and Needles Qs 1 Mo 9)  Flomax 0.4 Mg  Cp24 (Tamsulosin Hcl) .Marland Kitchen.. 1 By Mouth Daily 10)  Simvastatin 20 Mg Tabs (Simvastatin) .... One By Mouth Daily 11)  Prodigy Blood Glucose Monitor W/device Kit (Blood Glucose  Monitoring Suppl) .... Dispense One Meter Kit 12)  Prodigy Eject Blood Glucose  Strp (Glucose Blood) .... Use As Directed 13)  Prodigy Twist Top Lancets 28g  Misc (Lancets) .... As Directed 14)  Lamictal 200 Mg Tabs (Lamotrigine) .... Per Dr[facility] Kathrynn [facility].  Take One Tablet Daily. 15)  Lithium Carbonate 300 Mg Caps (Lithium Carbonate) .... Written By Dr. Kathrynn [facility] in [facility]. 16)  Bd Insulin Syringe Microfine 28g X 1/2" 1 Ml Misc (Insulin Syringe-Needle U-100) .... Use Three Times A Day As Directed.  Disp Qs X1 Month. 17)  Glucagon Emergency 1 Mg Kit (Glucagon (Rdna)) .... For Use With Sugars Below 40 18)  Humalog Pen 100 Unit/ml Soln (Insulin Lispro (Human)) .... Use As Directed; Disp Qs For 60-70 Units Per Day For One Month 19)  Blood Glucose Monitor  Kit (Blood Glucose Monitoring Suppl) .... For Checking Sugars As Directed 20)  Januvia 100 Mg Tabs (Sitagliptin Phosphate) .... Once Daily - Same Time Each Day. 21)  Gabapentin 300 Mg Caps (Gabapentin) .... Take 900mg  Three Times A Day 22)  Saphris 5 Mg Subl (Asenapine Maleate) .... Per Youth worker.  Per Dr. Kathrynn [facility].  Allergies (verified): 1)  Sulfamethoxazole (Sulfamethoxazole)  Physical Exam  Additional Exam:  Resting Tremor.  Patient reports worse  when trying write.  Bilateral arm cogwheel rigidity   Impression & Recommendations:  Problem # 1:  DIABETES MELLITUS II, UNCOMPLICATED (ICD-250.00) Assessment Unchanged Patient has well controlled diabetes with most recent A1C 6.0.   Patient did not bring in his blood glucose log but states his glucose levels have been between 100 and 225. Pt denies any hypoglycemic events.   He has decreased his daily insulin requirment from 60 units to 45 units since starting Januvia about 3 weeks ago.   He reports compliance with his regimen.  No changes made today with diabetes managment. Plan to see him back in 1 month to reassess glucose control.     His updated medication list for this problem includes:    Lisinopril 10 Mg Tabs (Lisinopril) .Marland Kitchen... 1/2 tab daily    Humalog 100 Unit/ml Soln (Insulin lispro (human)) .Marland KitchenMarland KitchenMarland KitchenMarland Kitchen 15-20 u prior to breakfast, 15-20 u prior to lunch and 15-20 u prior to evening meal.  please disp with syringes and needles qs 1 mo    Glucagon Emergency 1 Mg Kit (Glucagon (rdna)) .Marland Kitchen... For use with sugars below 40    Humalog Pen 100 Unit/ml Soln (Insulin lispro (human)) ..... Use as directed; disp qs for 60-70 units per day for one month    Januvia 100 Mg Tabs (Sitagliptin phosphate) ..... Once daily - same time each day.  Orders: Comp Met-[facility] ([id number]) Reassessment Each 15 min unit- [facility] (66440)  Problem # 2:  BIPOLAR AFFECTIVE DISORDER (ICD-296.80) Assessment: Comment Only Patient  is still having headaches despite switch from Zyprexa to Saphris and has a new complaint of tremor at rest which started about 1 month ago.  He  has  cogwheel rigidity on physical exam.  Pt. is also on lithium.  He has recently been taking advil for headaches which may interact with the lithium.  Lithium level was obtained today. Reevaluate cogwheel rigidity and tremor in  [facility] clinic/PCP with Dr. Rexene Alberts  tomorrow for adjustment of his medications if needed.  Orders: Comp Met-[facility] [phone]) T-Lithium Level ([id number])  Complete Medication List: 1)  Ambien 10 Mg Tabs (Zolpidem tartrate) .... Take 1 tablet by mouth at bedtime 2)  Lisinopril 10 Mg Tabs (Lisinopril) .... 1/2 tab daily 3)  Prilosec 20 Mg Cpdr (Omeprazole) .... Take 1 capsule by mouth once a day 4)  Cialis 20 Mg Tabs (Tadalafil) .... 1/4 tablet by mouth as needed 5)  Bystolic 5 Mg Tabs (Nebivolol hcl) .... 1/2 daily 6)  Multivitamins Tabs (Multiple vitamin) .... Take 1 tablet daily. 7)  Triamcinolone Acetonide 0.1 % Oint (Triamcinolone acetonide) .... Aaa three times a day #one large tube 8)  Humalog 100 Unit/ml Soln (Insulin lispro (human)) .Marland KitchenMarland KitchenMarland Kitchen 15-20 u  prior to breakfast, 15-20 u prior to lunch and 15-20 u prior to evening meal.  please disp with syringes and needles qs 1 mo 9)  Flomax 0.4 Mg Cp24 (Tamsulosin hcl) .Marland Kitchen.. 1 by mouth daily 10)  Simvastatin 20 Mg Tabs (Simvastatin) .... One by mouth daily 11)  Prodigy Blood Glucose Monitor W/device Kit (Blood glucose monitoring suppl) .... Dispense one meter kit 12)  Prodigy Eject Blood Glucose Strp (Glucose blood) .... Use as directed 13)  Prodigy Twist Top Lancets 28g Misc (Lancets) .... As directed 14)  Lamictal 200 Mg Tabs (Lamotrigine) .... Per dr. Kathrynn Running [facility].  take one tablet daily. 15)  Lithium Carbonate 300 Mg Caps (Lithium carbonate) .... Written by dr. Kathrynn Running in [facility]. 16)  Bd Insulin Syringe Microfine  28g X 1/2" 1 Ml Misc (Insulin syringe-needle u-100) .... Use three times a day as directed.  disp qs x1 month. 17)  Glucagon Emergency 1 Mg Kit (Glucagon (rdna)) .... For use with sugars below 40 18)  Humalog Pen 100 Unit/ml Soln (Insulin lispro (human)) .... Use as directed; disp qs for 60-70 units per day for one month 19)  Blood Glucose Monitor Kit (Blood glucose monitoring suppl) .... For checking sugars as directed 20)  Januvia 100 Mg Tabs (Sitagliptin phosphate) .... Once daily - same time each day. 21)  Gabapentin 300 Mg Caps (Gabapentin) .... Take 900mg  three times a day 22)  Saphris 5 Mg Subl (Asenapine maleate) .... Per package instructions.  per dr. Kathrynn Running in [facility].  Other Orders: TSH-[facility] ([id number])  Patient Instructions: 1)  Follow-up tomorrow with [facility] with Dr. Pascal Lux and Dr. Kathrynn Running AND with Dr. Rexene Alberts.  2)  No change today for anything with Januvia and same insulin plan.  3)  Lab work should be [employer] [facility].   Appended Document: F/U Diabetes - Headaches - Tremor     Vital Signs:  Patient profile:   50 year old male Height:      69 inches Weight:      158 pounds Pulse rate:   66 /  minute BP sitting:   132 / 69  (right arm)  Allergies: 1)  Sulfamethoxazole (Sulfamethoxazole)   Complete Medication List: 1)  Ambien 10 Mg Tabs (Zolpidem tartrate) .... Take 1 tablet by mouth at bedtime 2)  Lisinopril 10 Mg Tabs (Lisinopril) .... 1/2 tab daily 3)  Prilosec 20 Mg Cpdr (Omeprazole) .... Take 1 capsule by mouth once a day 4)  Cialis 20 Mg Tabs (Tadalafil) .... 1/4 tablet by mouth as needed 5)  Bystolic 5 Mg Tabs (Nebivolol hcl) .... 1/2 daily 6)  Multivitamins Tabs (Multiple vitamin) .... Take 1 tablet daily. 7)  Triamcinolone Acetonide 0.1 % Oint (Triamcinolone acetonide) .... Aaa three times a day #one large tube 8)  Humalog 100 Unit/ml Soln (Insulin lispro (human)) .Marland KitchenMarland KitchenMarland Kitchen 15-20 u prior to breakfast, 15-20 u prior to lunch and 15-20 u prior to evening meal.  please disp with syringes and needles qs 1 mo 9)  Flomax 0.4 Mg Cp24 (Tamsulosin hcl) .Marland Kitchen.. 1 by mouth daily 10)  Simvastatin 20 Mg Tabs (Simvastatin) .... One by mouth daily 11)  Prodigy Blood Glucose Monitor W/device Kit (Blood glucose monitoring suppl) .... Dispense one meter kit 12)  Prodigy Eject Blood Glucose Strp (Glucose blood) .... Use as directed 13)  Prodigy Twist Top Lancets 28g Misc (Lancets) .... As directed 14)  Lamictal 200 Mg Tabs (Lamotrigine) .... Per dr. Kathrynn Running [facility].  take one tablet daily. 15)  Lithium Carbonate 300 Mg Caps (Lithium carbonate) .... Written by dr. Kathrynn Running in [facility]. 16)  Bd Insulin Syringe Microfine 28g X 1/2" 1 Ml Misc (Insulin syringe-needle u-100) .... Use three times a day as directed.  disp qs x1 month. 17)  Glucagon Emergency 1 Mg Kit (Glucagon (rdna)) .... For use with sugars below 40 18)  Humalog Pen 100 Unit/ml Soln (Insulin lispro (human)) .... Use as directed; disp qs for 60-70 units per day for one month 19)  Blood Glucose Monitor Kit (Blood glucose monitoring suppl) .... For checking sugars as directed 20)  Januvia 100 Mg Tabs (Sitagliptin  phosphate) .... Once daily - same time each day. 21)  Gabapentin 300 Mg Caps (Gabapentin) .... Take 900mg  three times a day  22)  Saphris 5 Mg Subl (Asenapine maleate) .... Per package instructions.  per dr. Kathrynn Running in [facility].

## 2010-09-21 NOTE — Progress Notes (Signed)
Summary: Rx Req  Phone Note Refill Request Call back at [phone] Message from:  Patient  Refills Requested: Medication #1:  AMBIEN 10 MG TABS Take 1 tablet by mouth at bedtime [facility] .  Initial call taken by: Clydell Hakim,  April 27, 2010 9:58 AM  Follow-up for Phone Call        Rx ready up front Follow-up by: Angelena Sole MD,  April 28, 2010 8:59 AM    Prescriptions: AMBIEN 10 MG TABS (ZOLPIDEM TARTRATE) Take 1 tablet by mouth at bedtime  #30 x 3   Entered and Authorized by:   Angelena Sole MD   Signed by:   Angelena Sole MD on 04/28/2010   Method used:   Print then Give to Patient   RxID:   [id number]

## 2010-09-21 NOTE — Progress Notes (Signed)
Summary: PA for Humalog pen  Phone Note Outgoing Call   Call placed by: Myrtie Soman  MD,  October 14, 2009 12:40 PM Summary of Call: spoke with pharmacy regarding Humalog pen. PA required and obtained for this rx.  Initial call taken by: Myrtie Soman  MD,  October 14, 2009 12:40 PM

## 2010-09-21 NOTE — Letter (Signed)
Summary: Generic Letter  [facility]  [address]   [place], [place] [address]   Phone: [phone]  Fax: [phone]    06/18/2010  Patrick Elliott [address] [place], [place]  [address]  Dear Patrick Elliott,  Here is a copy of your lab results.  Overall they looked pretty good, and no definite reason to explain your symptoms.  We will continue to work on them.  Tests: (1) Magnesium        Your value                  Normal range   Magnesium                 1.8 mg/dL                   5.7-8.4  Tests: (2) TSH (23280)   TSH                       2.806 uIU/mL                0.350-4.500  Tests: (3) Vitamin B12 (69629)   Vitamin B12               592 pg/mL                   211-911  Tests: (4) Vitamin D (25-Hydroxy) (52841)  Vitamin D (25-Hydroxy)                             39 ng/mL                    30-89         Sincerely,   Angelena Sole MD  Appended Document: Generic Letter mailed

## 2010-09-21 NOTE — Progress Notes (Signed)
Summary: Rx Req  Phone Note Refill Request Call back at Home Phone [phone] Message from:  Patient  Refills Requested: Medication #1:  JANUVIA 100 MG TABS once daily - same time each day. [facility]   Initial call taken by: Clydell Hakim,  December 02, 2009 4:21 PM    Prescriptions: JANUVIA 100 MG TABS (SITAGLIPTIN PHOSPHATE) once daily - same time each day.  #30 x 3   Entered and Authorized by:   Myrtie Soman  MD   Signed by:   Myrtie Soman  MD on 12/03/2009   Method used:   [facility]ically to        [id number]       [address]       [place], [place]  [address]       Ph: [phone] or [phone]       Fax: [phone]   RxID:   [id number]

## 2010-09-21 NOTE — Miscellaneous (Signed)
Summary: Appointment Canceled  Appointment status changed to canceled by [employer] on 05/24/2010 10:54 AM.  Cancellation Comments --------------------- echo/sob/htn/syncope  Appointment Information ----------------------- Appt Type:  CARDIOLOGY ANCILLARY VISIT      Date:  Thursday, June 03, 2010      Time:  4:00 PM for 60 min   Urgency:  Routine   Made By:  Pearson Grippe  To Visit:  [id number]    Reason:  echo/sob/htn/syncope  Appt Comments ------------- -- 05/24/10 10:54: ([facility]) CANCELED -- echo/sob/htn/syncope -- 05/21/10 13:27: ([facility]) BOOKED -- Routine CARDIOLOGY ANCILLARY VISIT at 06/03/2010 4:00 PM for 60 min echo/sob/htn/syncope

## 2010-09-21 NOTE — Assessment & Plan Note (Signed)
Summary: [facility] Follow-up   Primary Care Provider:  Myrtie Soman  MD   History of Present Illness: Patrick Elliott reports difficulty with his eyes and his stomach.  He reports depressed mood with no "ups," racing thoughts, and things "sticking in his head."  With regard to his eyes - reports blurry vision that lasted 4-5 hours.  Made it challenging to walk - through doors or up stairs.  Nausea - breif, mild to moderate every 3-4 days.  Depressed and irritable.  Would have stayed in bed today if didn't have this appt.     Allergies: 1)  Sulfamethoxazole (Sulfamethoxazole)   Impression & Recommendations:  Problem # 1:  BIPOLAR AFFECTIVE DISORDER (ICD-296.80)  Patrick Elliott appears flat today.  Mood is irritable.  He is dressed appropriately.  Thoughts are clear and goal directed.    Depakote level was nearly therapeutic at 48.5 on 12/25/2009.  It was 17 on 01/01/10.  He denies missing a dose except for Saturday, 01/02/10 secondary to concenrs about his vision.  Not sure the reason for the drop.  Discussed options of stopping the medicine (thinking it may be making him worse and certainly not better) or verifying that the level is therapeutic and safety is assured and the trial can proceed.  We do think that perhaps the Lamictal and Lithium do help him some.  Denied suicidal and homicidal ideation.  He reports this type of safety is not an issue.  As a team (Patrick Elliott included) decided to gather more data (labs) and see where he is.  Will not d/c the medicine for now.  Dr. Kathrynn Running will attempt to communicate him again during the week.  A test message was sent to Patrick Elliott's cell phone but he did not have his cell phone with him but at least he knows the message was left.  Home number was provided as well.  See patient instructions.  Orders: Therapy 20-30 min- [facility] ([address])  Complete Medication List: 1)  Ambien 10 Mg Tabs (Zolpidem tartrate) .... Take 1 tablet by mouth at bedtime 2)  Lisinopril 10 Mg Tabs  (Lisinopril) .... 1/2 tab daily 3)  Prilosec 20 Mg Cpdr (Omeprazole) .... Take 1 capsule by mouth once a day 4)  Cialis 20 Mg Tabs (Tadalafil) .... 1/4 tablet by mouth as needed 5)  Bystolic 5 Mg Tabs (Nebivolol hcl) .... 1/2 daily 6)  Multivitamins Tabs (Multiple vitamin) .... Take 1 tablet daily. 7)  Triamcinolone Acetonide 0.1 % Oint (Triamcinolone acetonide) .... Aaa three times a day #one large tube 8)  Humalog 100 Unit/ml Soln (Insulin lispro (human)) .Marland KitchenMarland KitchenMarland Kitchen 15-20 u prior to breakfast, 15-20 u prior to lunch and 15-20 u prior to evening meal.  please disp with syringes and needles qs 1 mo 9)  Flomax 0.4 Mg Cp24 (Tamsulosin hcl) .Marland Kitchen.. 1 by mouth daily 10)  Simvastatin 20 Mg Tabs (Simvastatin) .... One by mouth daily 11)  Prodigy Blood Glucose Monitor W/device Kit (Blood glucose monitoring suppl) .... Dispense one meter kit 12)  Prodigy Eject Blood Glucose Strp (Glucose blood) .... Use as directed 13)  Prodigy Twist Top Lancets 28g Misc (Lancets) .... As directed 14)  Lamictal 200 Mg Tabs (Lamotrigine) .... Per dr. Kathrynn Running [facility].  take one tablet daily. 15)  Lithium Carbonate 300 Mg Caps (Lithium carbonate) .... Written by dr. Kathrynn Running in [facility]. 16)  Bd Insulin Syringe Microfine 28g X 1/2" 1 Ml Misc (Insulin syringe-needle u-100) .... Use three times a day as directed.  disp qs  x1 month. 17)  Glucagon Emergency 1 Mg Kit (Glucagon (rdna)) .... For use with sugars below 40 18)  Humalog Pen 100 Unit/ml Soln (Insulin lispro (human)) .... Use as directed; disp qs for 60-70 units per day for one month 19)  Blood Glucose Monitor Kit (Blood glucose monitoring suppl) .... For checking sugars as directed 20)  Januvia 100 Mg Tabs (Sitagliptin phosphate) .... Once daily - same time each day. 21)  Gabapentin 300 Mg Caps (Gabapentin) .... Take 900mg  three times a day 22)  Tramadol Hcl 50 Mg Tabs (Tramadol hcl) .... One tab by mouth every 6 hours as needed for headache 23)   Depakote Er 250 Mg Tb24 (Divalproex sodium (migraine)) .... Take one daily with food.  per dr. Kathrynn Running in [facility].  Patient Instructions: 1)  Please get labwork drawn today - your usual labs including a Depakote level, CBC and Ammonia level. 2)  Please schedule a follow-up for:  June 1st at 11:30. 3)  Dr. Kathrynn Running will be contacting you during the week.  If you do not hear from him, please call him at [phone].

## 2010-09-21 NOTE — Miscellaneous (Signed)
  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

## 2010-09-21 NOTE — Assessment & Plan Note (Signed)
Summary: [facility] follow-up   Primary Care Provider:  Myrtie Soman  MD   History of Present Illness: [name]'s agenda includes:  1)  Headaches have decreased in intensity since stopping the Lamictal.  They are still present but he is not yet out a week from stopping the medication.  2)  He reports significant anxiety and depression.    3)  He remains queasy in the morning and periodically throughout the day.  It deters him for eating.  4)  Sleeping about 3-4 hours without the Ambien.  With the Ambien 6-8 hours with a couple hour of hangover in the morning.  He is on the extended release form.  He takes it around 11:00 or so but may not go to sleep until 2:00 or 3:00.  He is temperamentally a short-sleeper.  5)  Terrible pains in joints - wonders if it is coming off of Lamictal.  Dr. Kathrynn Running thinks this is unlikely.  6) He reports a rash.  He continues to report periodic racing thoughts.  He is unable to tell frequency or if they are better.  In the past week - he reports "quite a bit" of irritable mood.  [name] hasn't noticed it but think that he covers it up for her.  He did remark to her that he was feeling very irritable.  Had a discussion about function.  See assessment and plan.  Allergies: 1)  Sulfamethoxazole (Sulfamethoxazole)   Impression & Recommendations:  Problem # 1:  BIPOLAR AFFECTIVE DISORDER (ICD-296.80) Addressed issues on his list that were [facility] related.  The rash and joint pain was not deemed to be related to his mood or his medications for his mood.  He can follow-up with his PCP if these issues persist.  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  This is in contrast to previous reports where he woudn't /  couldn't get out of bed for a day or more.  This prompted a more focused discussion about function.  In the past, we have been unable to get a sense of any real progress.  Today, we learned that [name]  focuses on what isn't going well in hopes of getting that fixed.  It seems like we have a different sense of how to report on symptoms / function.  Communicated that we need to know what is improving as well as things that are continuing to bother him in order to get an accurate sense of the effectiveness of his treatment.  Not sure if this will change his reporting style but he did state that if this is all the medicine can do, "I am okay with that."  This is the first we have heard this.  His Depakote level is therapeutic.  Last lithium level was therapeutic in 11/2009.  Will draw again next time he gets labs.    Orders: Therapy 20-30 min- [facility] ([id number])Future Orders: Miscellaneous Lab Charge-[facility] [id number]) ... 02/09/2011  Complete Medication List: 1)  Ambien 10 Mg Tabs (Zolpidem tartrate) .... Take 1 tablet by mouth at bedtime 2)  Lisinopril 10 Mg Tabs (Lisinopril) .... 1/2 tab daily 3)  Prilosec 20 Mg Cpdr (Omeprazole) .... Take 1 capsule by mouth once a day 4)  Cialis 20 Mg Tabs (Tadalafil) .... 1/4 tablet by mouth as needed 5)  Bystolic 5 Mg Tabs (Nebivolol hcl) .... 1/2 daily 6)  Multivitamins Tabs (Multiple vitamin) .... Take 1 tablet daily. 7)  Triamcinolone Acetonide 0.1 %  Oint (Triamcinolone acetonide) .... Aaa three times a day #one large tube 8)  Humalog 100 Unit/ml Soln (Insulin lispro (human)) .Marland KitchenMarland KitchenMarland Kitchen 15-20 u prior to breakfast, 15-20 u prior to lunch and 15-20 u prior to evening meal.  please disp with syringes and needles qs 1 mo 9)  Flomax 0.4 Mg Cp24 (Tamsulosin hcl) .Marland Kitchen.. 1 by mouth daily 10)  Simvastatin 20 Mg Tabs (Simvastatin) .... One by mouth daily 11)  Prodigy Blood Glucose Monitor W/device Kit (Blood glucose monitoring suppl) .... Dispense one meter kit 12)  Prodigy Eject Blood Glucose Strp (Glucose blood) .... Use as directed 13)  Prodigy Twist Top Lancets 28g Misc (Lancets) .... As directed 14)  Lithium Carbonate 300 Mg Caps (Lithium carbonate) .... Written by dr. Kathrynn Running in  mood disorder clinic. 15)  Bd Insulin Syringe Microfine 28g X 1/2" 1 Ml Misc (Insulin syringe-needle u-100) .... Use three times a day as directed.  disp qs x1 month. 16)  Glucagon Emergency 1 Mg Kit (Glucagon (rdna)) .... For use with sugars below 40 17)  Humalog Pen 100 Unit/ml Soln (Insulin lispro (human)) .... Use as directed; disp qs for 60-70 units per day for one month 18)  Blood Glucose Monitor Kit (Blood glucose monitoring suppl) .... For checking sugars as directed 19)  Januvia 100 Mg Tabs (Sitagliptin phosphate) .... Once daily - same time each day. 20)  Gabapentin 300 Mg Caps (Gabapentin) .... Take 900mg  three times a day 21)  Tramadol Hcl 50 Mg Tabs (Tramadol hcl) .... One tab by mouth every 6 hours as needed for headache 22)  Depakote Er 250 Mg Tb24 (Divalproex sodium (migraine)) .... Take two daily with food.  per dr. Kathrynn Running in mood disorder clinic.  Patient Instructions: 1)  Please schedule a follow up for: July 6th at 11:30.  2)  Your next lab draw should include a Lithium level.  I will put in the order.  You can get your labs draws every two weeks until you hear differently. 3)  There is an interesting book out recently called Flourish.  It is written by a guy who pushed the "Positive Psychology" movement Daryl Eastern).  It might be an interesting read for you or [name].   4)  The Depakote and Lithium, currently, appear tolerable and safe.  Dr. Kathrynn Running has recommended that we stay the course to see what benefit you might get.  It may be worthwhile to note periods of time where you feel well (mentally).  Our brains are designed to look for danger - not health.  It may be beneficial to shift your focus.

## 2010-09-21 NOTE — Assessment & Plan Note (Signed)
Summary: [facility] Follow-up   Primary Care Provider:  Myrtie Soman  MD   History of Present Illness: Reports feeling a bit more depressed - flat.  Still not sleeping well - racing thoughts.  Reports loss of appetite and lack of concentration.  Reports continued shaking and some eye twitching.  [name], his significant other, wonders whether some of the atypical antipsychotics we have tried in the past were not responsible for these side effects - that perhaps the Lithium was responsible for that since we had not checked a level since 02/2009.  Discussed this.  Patrick Elliott reports a significant headache.  He was taking Lamictal regularly before the headache started.  It appears to have started prior to pollen became an issue.  Patrick Elliott denies history of migraine.  He was given a prescription for Tramadol by his PCP.  The headache negatively affects both mood and function.  Allergies: 1)  Sulfamethoxazole (Sulfamethoxazole)   Impression & Recommendations:  Problem # 1:  BIPOLAR AFFECTIVE DISORDER (ICD-296.80)  Discussed options from here.  Dr. Broadus John sense is that the Lithium was not likely to be the cause of his lack of tolerability to the multiple atypical antipsychotics tried.  We talked about starting Depakote today with the idea that it might help his headache as well as his mood.  Very close monitoring for liver issues would be necessary (weekly monitoring until stable dose and evidence of no harm).  Do not want to do two things at once (i.e. Tramadol and Depakote).  Also - wanted to get the Lithium level back prior to doing anything.  Therefore, elected to wait two weeks to revisit the issue.  If the Tramadol is effective, will hopefully have a better sense of mood.  If not - will revisit the Depakote option.  Orders: Therapy 20-30 min- [facility] ([address])  Complete Medication List: 1)  Ambien 10 Mg Tabs (Zolpidem tartrate) .... Take 1 tablet by mouth at bedtime 2)  Lisinopril 10 Mg  Tabs (Lisinopril) .... 1/2 tab daily 3)  Prilosec 20 Mg Cpdr (Omeprazole) .... Take 1 capsule by mouth once a day 4)  Cialis 20 Mg Tabs (Tadalafil) .... 1/4 tablet by mouth as needed 5)  Bystolic 5 Mg Tabs (Nebivolol hcl) .... 1/2 daily 6)  Multivitamins Tabs (Multiple vitamin) .... Take 1 tablet daily. 7)  Triamcinolone Acetonide 0.1 % Oint (Triamcinolone acetonide) .... Aaa three times a day #one large tube 8)  Humalog 100 Unit/ml Soln (Insulin lispro (human)) .Marland KitchenMarland KitchenMarland Kitchen 15-20 u prior to breakfast, 15-20 u prior to lunch and 15-20 u prior to evening meal.  please disp with syringes and needles qs 1 mo 9)  Flomax 0.4 Mg Cp24 (Tamsulosin hcl) .Marland Kitchen.. 1 by mouth daily 10)  Simvastatin 20 Mg Tabs (Simvastatin) .... One by mouth daily 11)  Prodigy Blood Glucose Monitor W/device Kit (Blood glucose monitoring suppl) .... Dispense one meter kit 12)  Prodigy Eject Blood Glucose Strp (Glucose blood) .... Use as directed 13)  Prodigy Twist Top Lancets 28g Misc (Lancets) .... As directed 14)  Lamictal 200 Mg Tabs (Lamotrigine) .... Per dr. Kathrynn Running mood disorder clinic.  take one tablet daily. 15)  Lithium Carbonate 300 Mg Caps (Lithium carbonate) .... Written by dr. Kathrynn Running in mood disorder clinic. 16)  Bd Insulin Syringe Microfine 28g X 1/2" 1 Ml Misc (Insulin syringe-needle u-100) .... Use three times a day as directed.  disp qs x1 month. 17)  Glucagon Emergency 1 Mg Kit (Glucagon (rdna)) .... For use with sugars  below 40 18)  Humalog Pen 100 Unit/ml Soln (Insulin lispro (human)) .... Use as directed; disp qs for 60-70 units per day for one month 19)  Blood Glucose Monitor Kit (Blood glucose monitoring suppl) .... For checking sugars as directed 20)  Januvia 100 Mg Tabs (Sitagliptin phosphate) .... Once daily - same time each day. 21)  Gabapentin 300 Mg Caps (Gabapentin) .... Take 900mg  three times a day 22)  Tramadol Hcl 50 Mg Tabs (Tramadol hcl) .... One tab by mouth every 6 hours as needed for  headache  Patient Instructions: 1)  Please schedule a follow-up for April 27th at 10:00. 2)  Discussed possible contributors to your headache today.  Still not clear.  We would like to know how your mood is when you are not having pain on a daily basis. 3)  We discussed the possible use of Depakote.  There are risks associated with this as we discussed.  We would like to get your lithium level back prior to making any changes.

## 2010-09-21 NOTE — Assessment & Plan Note (Signed)
Summary: Mood Disorder Follow-up   Primary Care Provider:  Angelena Sole MD   History of Present Illness: Patrick Elliott's agenda includes:  Steady chatter in his head.  Does not sound like a conversation.  Sounds noisy.    Jerking (myoclonic like according to he and Patrick Elliott).  Thinks it might be related to Lithium.  "Major headaches."  Pretty much the same as before.  Some relief with Tramadol.  Did not get better with d/c Lamictal.  Depression:  Same as reported previously.  Focus:  Same as reported previously.  Numbing in hands and feet:  Unsure etiology.    Dose of Lithium is 450 mg.  Stopped the Depakote secondary to a rising Ammonia level indicating a lack of safety.  Function remains the same.    Allergies: 1)  Sulfamethoxazole (Sulfamethoxazole)   Impression & Recommendations:  Problem # 1:  BIPOLAR AFFECTIVE DISORDER (ICD-296.80) Mood is reported as good today but with frequent, intense and short lived depressive episodes.  Affect is bright today.  He laughs appropriately.  Thoughts are clear and goal directed.  No suidical / homicidal ideation.  Lithium may be causing the muscle jerks.  Question is whether the jerks are worth the perceived benefit of Lithium.  Dr. Kathrynn Running and my impression is that there is a modest improvement on the Lithium in terms of function, even if his report of mood symptoms has not changed appreciably.  Patrick Elliott agrees there is benefit and would like to continue the medicine.  Patrick Elliott is interested in dealing with the attention / focus issue (see below) as well as the depression.  Discussed restarting the Lamictal since the headaches have persisted and therefore Lamictal is not likely the cause of his headaches.  Will not start two medications at one time.  Patrick Elliott opted for starting Vyvanse for focus (see below).  [facility]  Problem # 2:  ADD (ICD-314.00) Dr. Kathrynn Running is interested in trying to capture the attention / focus  issue with a small dose of Vyvanse.  Discussed potential benefits, AE and safety issues.  Dr. Kathrynn Running will titrate the dose of Vyvanse based on Patrick Elliott's report of concentration and focus.  His report of symptoms has been challenging in the past.  Asked him to be as objective as possible with specific behaviors targeted (time spent reading, fewer things lost, canvases completed).  Dr. Kathrynn Running will start at 20 mg of Vyvanse.  See patient instructions.  Complete Medication List: 1)  Ambien 10 Mg Tabs (Zolpidem tartrate) .... Take 1 tablet by mouth at bedtime 2)  Lisinopril 10 Mg Tabs (Lisinopril) .... 1/2 tab daily 3)  Prilosec 20 Mg Cpdr (Omeprazole) .... Take 1 capsule by mouth once a day 4)  Cialis 20 Mg Tabs (Tadalafil) .... 1/4 tablet by mouth as needed 5)  Bystolic 5 Mg Tabs (Nebivolol hcl) .... 1/2 daily 6)  Multivitamins Tabs (Multiple vitamin) .... Take 1 tablet daily. 7)  Triamcinolone Acetonide 0.1 % Oint (Triamcinolone acetonide) .... Aaa three times a day #one large tube 8)  Humalog 100 Unit/ml Soln (Insulin lispro (human)) .Marland KitchenMarland KitchenMarland Kitchen 15-20 u prior to breakfast, 15-20 u prior to lunch and 15-20 u prior to evening meal.  please disp with syringes and needles qs 1 mo 9)  Flomax 0.4 Mg Cp24 (Tamsulosin hcl) .Marland Kitchen.. 1 by mouth daily 10)  Simvastatin 20 Mg Tabs (Simvastatin) .... One by mouth daily 11)  Prodigy Blood Glucose Monitor W/device Kit (Blood glucose monitoring suppl) .... Dispense one meter kit  12)  Prodigy Eject Blood Glucose Strp (Glucose blood) .... Use as directed 13)  Prodigy Twist Top Lancets 28g Misc (Lancets) .... As directed 14)  Lithium Carbonate 300 Mg Caps (Lithium carbonate) .... Written by dr. Kathrynn Running in mood disorder clinic. 15)  Bd Insulin Syringe Microfine 28g X 1/2" 1 Ml Misc (Insulin syringe-needle u-100) .... Use three times a day as directed.  disp qs x1 month. 16)  Glucagon Emergency 1 Mg Kit (Glucagon (rdna)) .... For use with sugars below 40 17)  Humalog Pen 100  Unit/ml Soln (Insulin lispro (human)) .... Use as directed; disp qs for 60-70 units per day for one month 18)  Blood Glucose Monitor Kit (Blood glucose monitoring suppl) .... For checking sugars as directed 19)  Januvia 100 Mg Tabs (Sitagliptin phosphate) .... Once daily - same time each day. 20)  Gabapentin 300 Mg Caps (Gabapentin) .... Take 900mg  three times a day 21)  Tramadol Hcl 50 Mg Tabs (Tramadol hcl) .... One tab by mouth every 6 hours as needed for headache 22)  Patanol 0.1 % Soln (Olopatadine hcl) .Marland Kitchen.. 1 drop each eye two times a day until cleared.  disp 1 bottle. 23)  Vyvanse 20 Mg Caps (Lisdexamfetamine dimesylate) .... Take one pill in the morning.  recommended by dr. Kathrynn Running in mood disorder clinic.  written by dr. Cristal Ford.  Patient Instructions: 1)  Dr. Kathrynn Running wrote for Vyvanse.  Take one pill in the morning with food.  After three days, you can go to two pills (40 mg).  Call Dr. Kathrynn Running in two weeks or earlier (if needed) to discusse efficacy and tolerability. 2)  Please schedule a follow-up in [facility] on September 28th at 10:00 (sorry about the early time frame).  :-) Prescriptions: VYVANSE 20 MG CAPS (LISDEXAMFETAMINE DIMESYLATE) Take one pill in the morning.  Recommended by Dr. Kathrynn Running in [facility].  Written by Dr. Cristal Ford.  #30 x 0   Entered and Authorized by:   [name] PsyD   Signed by:   [name] PsyD on 04/14/2010   Method used:   Handwritten   RxID:   [id number]

## 2010-09-21 NOTE — Progress Notes (Signed)
Summary: Update on meds  Phone Note Other Incoming   Caller: Patient Caller: [name], MD Summary of Call: Dr. Kathrynn Running called to report on a phone follow-up with [facility].  Patrick Elliott reported he has noticed some benefit and no side effects on 40 mg of Vyvanse.  Dr. Kathrynn Running will send him a prescription for 60 mg.  He was told he can back off to 40 mg if 60 feels like too much.  Additionally - Patrick Elliott was needing a refill on Ambien and Dr. Kathrynn Running was willing to refill.  I noted this prescription was already filled by his PCP.  Communicated this to Dr. Kathrynn Running.  Will document refill of Vyvanse. Initial call taken by: [name] PsyD,  April 28, 2010 4:17 PM    New/Updated Medications: VYVANSE 20 MG CAPS (LISDEXAMFETAMINE DIMESYLATE) Take three daily in the a.m.  Recommended by Dr. Kathrynn Running in [facility]. Prescriptions: VYVANSE 20 MG CAPS (LISDEXAMFETAMINE DIMESYLATE) Take three daily in the a.m.  Recommended by Dr. Kathrynn Running in [facility].  #90 x 0   Entered and Authorized by:   [name] PsyD   Signed by:   [name] PsyD on 04/29/2010   Method used:   Handwritten   RxID:   [id number]

## 2010-09-21 NOTE — Miscellaneous (Signed)
Summary: Orders Update  Clinical Lists Changes  Orders: Added new Test order of Comp Met-[facility] [id number]) - Signed Added new Test order of Ammonia-[facility] [id number]) - Signed Added new Test order of CBC-[facility] ([id number]) - Signed Added new Test order of Valproic Acid-[facility] ([id number]) - Signed

## 2010-09-21 NOTE — Assessment & Plan Note (Signed)
Summary: [facility] Follow-up   Primary Care Provider:  Myrtie Soman  MD   History of Present Illness: Discussed mood and symptoms.  Patrick Elliott has a tough time conceptualizing his symptoms (or we have a tough time understanding them).  He speaks of times of having so many thoughts in his head that he is stuck - can't act, even if he is motivated to do so.  These times can last hours but not days.  He notices more depression recently but reports enjoying things like movies, eating out and enjoying his girlfriend.  Tried to get a sense of function.  Not clear although it sounds as though he continues to paint and manage his DM.  His relationship with [name] continues to go well.  He acknowledges his health issues likely bring a burden to the relationship.  He is able to name companionship and creativity as positive things that he has brought to the relationship.  Discussed some significant social anxiety today as well.  Allergies: 1)  Sulfamethoxazole (Sulfamethoxazole)   Impression & Recommendations:  Problem # 1:  BIPOLAR AFFECTIVE DISORDER (ICD-296.80) Report of mood is more depressed but fluctuates frequently.  Affect is somewhat flat.  He did smile on occasion.  Thoughts are clear and goal directed.  Revisited idea of starting Depakote.  This was a strategy considered initially but put at the back of the list of possibilities secondary to the damage done to his liver previously.  We have reached the back of the list.  Recent CMP (11/10/09) reviewed.  Will check ammonia level today.  Will monitor bloodwork (CMET, CBC, Depakote and amonia) weekly.  Discussed risks with Patrick Elliott who voiced agreement with starting the medicine and following up with regular blood work.    Target symptoms are affective lability and racing thoughts.    Will discuss with [name]'s PCP.  Dr. Kathrynn Running and Patrick Elliott plan to communicate via phone and email in order to ensure close follow-up.  Orders: Therapy 20-30 min- [facility]  ([id number])Future Orders: Valproic Acid-[facility] ([id number]) ... 11/21/2010  Complete Medication List: 1)  Ambien 10 Mg Tabs (Zolpidem tartrate) .... Take 1 tablet by mouth at bedtime 2)  Lisinopril 10 Mg Tabs (Lisinopril) .... 1/2 tab daily 3)  Prilosec 20 Mg Cpdr (Omeprazole) .... Take 1 capsule by mouth once a day 4)  Cialis 20 Mg Tabs (Tadalafil) .... 1/4 tablet by mouth as needed 5)  Bystolic 5 Mg Tabs (Nebivolol hcl) .... 1/2 daily 6)  Multivitamins Tabs (Multiple vitamin) .... Take 1 tablet daily. 7)  Triamcinolone Acetonide 0.1 % Oint (Triamcinolone acetonide) .... Aaa three times a day #one large tube 8)  Humalog 100 Unit/ml Soln (Insulin lispro (human)) .Marland KitchenMarland KitchenMarland Kitchen 15-20 u prior to breakfast, 15-20 u prior to lunch and 15-20 u prior to evening meal.  please disp with syringes and needles qs 1 mo 9)  Flomax 0.4 Mg Cp24 (Tamsulosin hcl) .Marland Kitchen.. 1 by mouth daily 10)  Simvastatin 20 Mg Tabs (Simvastatin) .... One by mouth daily 11)  Prodigy Blood Glucose Monitor W/device Kit (Blood glucose monitoring suppl) .... Dispense one meter kit 12)  Prodigy Eject Blood Glucose Strp (Glucose blood) .... Use as directed 13)  Prodigy Twist Top Lancets 28g Misc (Lancets) .... As directed 14)  Lamictal 200 Mg Tabs (Lamotrigine) .... Per dr. Kathrynn Running mood disorder clinic.  take one tablet daily. 15)  Lithium Carbonate 300 Mg Caps (Lithium carbonate) .... Written by dr. Kathrynn Running in mood disorder clinic. 16)  Bd Insulin Syringe Microfine 28g X 1/2" 1  Ml Misc (Insulin syringe-needle u-100) .... Use three times a day as directed.  disp qs x1 month. 17)  Glucagon Emergency 1 Mg Kit (Glucagon (rdna)) .... For use with sugars below 40 18)  Humalog Pen 100 Unit/ml Soln (Insulin lispro (human)) .... Use as directed; disp qs for 60-70 units per day for one month 19)  Blood Glucose Monitor Kit (Blood glucose monitoring suppl) .... For checking sugars as directed 20)  Januvia 100 Mg Tabs (Sitagliptin phosphate) .... Once daily - same  time each day. 21)  Gabapentin 300 Mg Caps (Gabapentin) .... Take 900mg  three times a day 22)  Tramadol Hcl 50 Mg Tabs (Tramadol hcl) .... One tab by mouth every 6 hours as needed for headache 23)  Depakote Er 250 Mg Tb24 (Divalproex sodium (migraine)) .... Take one daily with food.  per dr. Kathrynn Running in mood disorder clinic.  Other Orders: Ammonia-[facility] [phone]) Future Orders: Comp Met-[facility] [id number]) ... 11/26/2010 Ammonia-[facility] ([id number]) ... 11/26/2010 CBC-[facility] ([id number]) ... 11/26/2010  Patient Instructions: 1)  Please schedule a follow-up for:  May 18th at 10:30. 2)  Please get bloodwork a week from this Friday which is May 6th.  You can schedule a lab appt on the way out. 3)  We wil be getting weekly lab work.   4)  You have Dr. Broadus John numbers.  Plan to communicate with him as instructed. Prescriptions: DEPAKOTE ER 250 MG TB24 (DIVALPROEX SODIUM (MIGRAINE)) Take one daily with food.  Per Dr. Kathrynn Running in [facility].  #90 x 0   Entered and Authorized by:   [name] PsyD   Signed by:   [name] PsyD on 12/16/2009   Method used:   Handwritten   RxID:   [id number]

## 2010-09-21 NOTE — Miscellaneous (Signed)
Summary: Alma Friendly approved  Clinical Lists Changes medicaid approved the [place] for 1 yr. faxed it to his pharmacy.[address] [address] RN  October 26, 2009 3:06 PM

## 2010-09-21 NOTE — Assessment & Plan Note (Signed)
Summary: f/u/kh   Vital Signs:  Patient profile:   50 year old male Height:      69 inches Weight:      161 pounds BMI:     23.86 BSA:     1.89 Temp:     97.8 degrees F Pulse rate:   60 / minute BP sitting:   144 / 75  Vitals Entered By: Jone Baseman CMA (May 19, 2010 10:50 AM) CC: f/u Is Patient Diabetic? Yes Did you bring your meter with you today? No Pain Assessment Patient in pain? no        Primary Care Provider:  Angelena Sole MD  CC:  f/u.  History of Present Illness: 1. Headache: - Off and on for the past 3 months - Has been having them 5 days out of the week - Last for a couple of hours and then go away - Located on the left side of his head - Associated with nausea and phonophobia, not associated with photophobia - Ultram not helping  ROS: denies vision changes, numbness / weakness, sinus pain / pressure, ear pain.  Endorses ringing in his ears  2. Indigestion - Has had this for months but it is worse over the past month - Desribed as epigastric pain with a burning / acid taste in his mouth - Worse after eating but also occurs randomly throughout the day - Seems to be worse since going to generic Omeprazole  ROS: denies chest pain, constipation, diarrhea  3. Dermatitis - Has had some red patches on different parts of his body  ROS: no blisters  4. DMII - Taking his medicines as presribed - Blood sugars averaging between 90 and 250  ROS: denies hypoglycemic episodes  Habits & Providers  Alcohol-Tobacco-Diet     Tobacco Status: never     Year Quit: 2007     Pack years: 0     Passive Smoke Exposure: no  Current Medications (verified): 1)  Ambien 10 Mg Tabs (Zolpidem Tartrate) .... Take 1 Tablet By Mouth At Bedtime 2)  Lisinopril 10 Mg  Tabs (Lisinopril) .... 1/2 Tab Daily 3)  Prilosec 20 Mg Cpdr (Omeprazole) .... Take 1 Capsule By Mouth Once A Day 4)  Cialis 20 Mg Tabs (Tadalafil) .... 1/4 Tablet By Mouth As Needed 5)  Bystolic  5 Mg  Tabs (Nebivolol Hcl) .... 1/2 Daily 6)  Multivitamins   Tabs (Multiple Vitamin) .... Take 1 Tablet Daily. 7)  Triamcinolone Acetonide 0.1 %  Oint (Triamcinolone Acetonide) .... Aaa Three Times A Day #one Large Tube 8)  Humalog 100 Unit/ml Soln (Insulin Lispro (Human)) .Marland KitchenMarland KitchenMarland Kitchen 15-20 U Prior To Breakfast, 15-20 U Prior To Lunch and 15-20 U Prior To Evening Meal.  Please Disp With Syringes and Needles Qs 1 Mo 9)  Flomax 0.4 Mg  Cp24 (Tamsulosin Hcl) .Marland Kitchen.. 1 By Mouth Daily 10)  Simvastatin 20 Mg Tabs (Simvastatin) .... One By Mouth Daily 11)  Prodigy Blood Glucose Monitor W/device Kit (Blood Glucose Monitoring Suppl) .... Dispense One Meter Kit 12)  Prodigy Eject Blood Glucose  Strp (Glucose Blood) .... Use As Directed 13)  Prodigy Twist Top Lancets 28g  Misc (Lancets) .... As Directed 14)  Lithium Carbonate 300 Mg Caps (Lithium Carbonate) .... Written By [facility]. 15)  Bd Insulin Syringe Microfine 28g X 1/2" 1 Ml Misc (Insulin Syringe-Needle U-100) .... Use Three Times A Day As Directed.  Disp Qs X1 Month. 16)  Glucagon Emergency 1 Mg Kit (Glucagon (  Rdna)) .... For Use With Sugars Below 40 17)  Humalog Pen 100 Unit/ml Soln (Insulin Lispro (Human)) .... Use As Directed; Disp Qs For 60-70 Units Per Day For One Month 18)  Blood Glucose Monitor  Kit (Blood Glucose Monitoring Suppl) .... For Checking Sugars As Directed 19)  Januvia 100 Mg Tabs (Sitagliptin Phosphate) .... Once Daily - Same Time Each Day. 20)  Gabapentin 300 Mg Caps (Gabapentin) .... Take 900mg  Three Times A Day 21)  Sumatriptan Succinate 50 Mg Tabs (Sumatriptan Succinate) .... Take 1 Tab Daily As Needed For Headaches, May Repeat X 1 If Needed.  Do Not Take More Than Twice Per Week. 22)  Patanol 0.1 % Soln (Olopatadine Hcl) .Marland Kitchen.. 1 Drop Each Eye Two Times A Day Until Cleared.  Disp 1 Bottle. 23)  Vyvanse 70 Mg Caps (Lisdexamfetamine Dimesylate) .... Take One Pill in The Morning.  Per Dr[facility]. 24)  Prilosec 20 Mg Cpdr (Omeprazole) .Marland Kitchen.. 1 Tab By Mouth Daily  Allergies: 1)  Sulfamethoxazole (Sulfamethoxazole)  Past History:  Past Medical History: Reviewed history from 01/22/2009 and no changes required. 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 Has used every drug in the book except IV ones but quit 2004.  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  Quit tob 04/2006.  Sexually active, uses condoms. Parents very involved.   Physical Exam  General:  vitals reviewed.  no acute distress Head:  normocephalic and atraumatic.   Eyes:  vision grossly intact.  PERRL.  EOMI Ears:  R ear normal and L ear normal.   Nose:  no external deformity and no nasal discharge.  No sinus pain Mouth:  pharynx pink and moist.   Neck:  supple, full ROM, and no masses.   Lungs:  Normal respiratory effort, chest expands symmetrically. Lungs are clear to auscultation, no crackles or wheezes. Heart:  normal rate, regular rhythm, and no murmur.   Abdomen:  soft, non-tender, normal bowel sounds, no distention, no masses, and no guarding.   Msk:  no joint tenderness and no joint swelling.   Extremities:  no lower extremity edema Neurologic:  alert & oriented X3, cranial nerves II-XII intact, strength normal in all extremities, sensation intact to light touch, and gait normal.   Skin:  erythematous patch around his neck.  No blisters. Psych:  Oriented X3, normally interactive, not anxious appearing, and not depressed appearing.     Impression & Recommendations:  Problem # 1:  HEADACHE (ICD-784.0) Assessment New Seems more  c/w migraines given phonophobia and nausea.  Will attempt trial of triptan.  He is on multiple medications and could be having medication side effect.  Will defer psych medicine management to Dr. Kathrynn Running.  Will check for gluten intolerance given triad of headache, indigestion, and dermatitis. His updated medication list for this problem includes:    Bystolic 5 Mg Tabs (Nebivolol hcl) .Marland Kitchen... 1/2 daily    Sumatriptan Succinate 50 Mg Tabs (Sumatriptan succinate) .Marland Kitchen... Take 1 tab daily as needed for headaches, may repeat x 1 if needed.  do not take more than twice per week.  Orders: Miscellaneous Lab Charge-[facility] [phone]) [facility]- Est  Level 4 (91478)  Problem # 2:  GERD (ICD-530.81) Assessment: Deteriorated Will have him take the brand name Prilosec since he seemed to do better with that.   Will check for gluten intolerance given triad of headache, indigestion, and dermatitis. The following medications were removed from the medication list:    Prilosec 20 Mg Cpdr (Omeprazole) .Marland Kitchen... Take 1 capsule by mouth once a day His updated medication list for this problem includes:    Prilosec 20 Mg Cpdr (Omeprazole) .Marland Kitchen... 1 tab by mouth daily  Orders: Miscellaneous Lab Charge-[facility] (96295) [facility]- Est  Level 4 (28413)  Problem # 3:  DIABETES MELLITUS II, UNCOMPLICATED (ICD-250.00) Assessment: Unchanged check A1C today.  CBGs at home are fairly well controlled.  No changes today. His updated medication list for this problem includes:    Lisinopril 10 Mg Tabs (Lisinopril) .Marland Kitchen... 1/2 tab daily    Humalog 100 Unit/ml Soln (Insulin lispro (human)) .Marland KitchenMarland KitchenMarland KitchenMarland Kitchen 15-20 u prior to breakfast, 15-20 u prior to lunch and 15-20 u prior to evening meal.  please disp with syringes and needles qs 1 mo    Glucagon Emergency 1 Mg Kit (Glucagon (rdna)) .Marland Kitchen... For use with sugars below 40    Humalog Pen 100 Unit/ml Soln (Insulin lispro (human)) ..... Use as directed; disp qs for 60-70 units per day for one month    Januvia 100 Mg Tabs  (Sitagliptin phosphate) ..... Once daily - same time each day.  Orders: A1C-[facility] (24401) [facility]- Est  Level 4 (02725)  Complete Medication List: 1)  Ambien 10 Mg Tabs (Zolpidem tartrate) .... Take 1 tablet by mouth at bedtime 2)  Lisinopril 10 Mg Tabs (Lisinopril) .... 1/2 tab daily 3)  Cialis 20 Mg Tabs (Tadalafil) .... 1/4 tablet by mouth as needed 4)  Bystolic 5 Mg Tabs (Nebivolol hcl) .... 1/2 daily 5)  Multivitamins Tabs (Multiple vitamin) .... Take 1 tablet daily. 6)  Triamcinolone Acetonide 0.1 % Oint (Triamcinolone acetonide) .... Aaa three times a day #one large tube 7)  Humalog 100 Unit/ml Soln (Insulin lispro (human)) .Marland KitchenMarland KitchenMarland Kitchen 15-20 u prior to breakfast, 15-20 u prior to lunch and 15-20 u prior to evening meal.  please disp with syringes and needles qs 1 mo 8)  Flomax 0.4 Mg Cp24 (Tamsulosin hcl) .Marland Kitchen.. 1 by mouth daily 9)  Simvastatin 20 Mg Tabs (Simvastatin) .... One by mouth daily 10)  Prodigy Blood Glucose Monitor W/device Kit (Blood glucose monitoring suppl) .... Dispense one meter kit 11)  Prodigy Eject Blood Glucose Strp (Glucose blood) .... Use as directed 12)  Prodigy Twist Top Lancets 28g Misc (Lancets) .... As directed 13)  Lithium Carbonate 300 Mg Caps (Lithium carbonate) .... Written by dr[facility]nn [facility] in [facility]. 14)  Bd Insulin Syringe Microfine 28g X 1/2" 1 Ml Misc (Insulin syringe-needle u-100) .... Use three times a day as directed.  disp qs x1 month. 15)  Glucagon Emergency 1 Mg Kit (Glucagon (rdna)) .... For use with sugars below 40 16)  Humalog Pen 100 Unit/ml Soln (Insulin lispro (human)) .... Use as directed; disp qs for 60-70 units per day for one month 17)  Blood Glucose Monitor Kit (Blood glucose monitoring suppl) .... For checking sugars as directed 18)  Januvia 100 Mg Tabs (Sitagliptin phosphate) .... Once daily - same time each day. 19)  Gabapentin 300 Mg Caps (Gabapentin) .... Take 900mg  three times a day 20)  Sumatriptan Succinate 50 Mg Tabs  (Sumatriptan succinate) .... Take 1 tab daily as needed for headaches, may repeat x 1 if needed.  do not take more than twice per week. 21)  Patanol 0.1 % Soln (Olopatadine hcl) .Marland Kitchen.. 1 drop each eye two times a day until cleared.  disp 1 bottle. 22)  Vyvanse 70 Mg Caps (Lisdexamfetamine dimesylate) .... Take one pill in the morning.  per dr[facility]nn [facility]. 23)  Prilosec 20 Mg Cpdr (Omeprazole) .Marland Kitchen.. 1 tab by mouth daily  Patient Instructions: 1)  We will check some labs today to see if we can figure out why you are having headaches 2)  Try taking Prilosec for your indigestion 3)  Try Sumatriptan for your headaches.  Please take as directed 4)  Schedule a follow up in 4 weeks to check on your headaches Prescriptions: SUMATRIPTAN SUCCINATE 50 MG TABS (SUMATRIPTAN SUCCINATE) Take 1 tab daily as needed for headaches, may repeat x 1 if needed.  Do not take more than twice per week.  #40 x 0   Entered and Authorized by:   Angelena Sole MD   Signed by:   Angelena Sole MD on 05/19/2010   Method used:   Electronically to        [id number] * (retail)       [address]       [place], [place]  [address]       Ph: [phone] or [phone]       Fax: [phone]   RxID:   [id number] PRILOSEC 20 MG CPDR (OMEPRAZOLE) 1 tab by mouth daily  #30 x 3   Entered and Authorized by:   Angelena Sole MD   Signed by:   Angelena Sole MD on 05/19/2010   Method used:   Electronically to        [id number] * (retail)       [address]       [place], [place]  [address]       Ph: [phone] or [phone]       Fax: [phone]   RxID:   [id number]   Laboratory Results   Blood Tests   Date/Time Received: May 19, 2010 10:50 AM  Date/Time Reported: May 19, 2010 11:06 AM   HGBA1C: 5.6%   (Normal Range: Non-Diabetic - 3-6%   Control Diabetic - 6-8%)  Comments: ...........test performed  by...........Marland KitchenTerese Door, CMA

## 2010-09-21 NOTE — Assessment & Plan Note (Signed)
Summary: Diabetes - [facility] Clinic   Primary Care Provider:  Myrtie Soman  MD   History of Present Illness: Patient arrives in good spirits.  States he is doing well.   He reports having daily AM headaches for the preceding two weeks.     He also reports his blood is "BLACK" for the preceding two weeks.   He has recently switch doses on Zyprexa.   We discussed the possibility of a med change as a possibility of the side effects he is experiencing.     We obtained a CBG check to visually check his blood.   Current Medications (verified): 1)  Ambien 10 Mg Tabs (Zolpidem Tartrate) .... Take 1 Tablet By Mouth At Bedtime 2)  Lisinopril 10 Mg  Tabs (Lisinopril) .... 1/2 Tab Daily 3)  Prilosec 20 Mg Cpdr (Omeprazole) .... Take 1 Capsule By Mouth Once A Day 4)  Cialis 20 Mg Tabs (Tadalafil) .... 1/4 Tablet By Mouth As Needed 5)  Bystolic 5 Mg  Tabs (Nebivolol Hcl) .... 1/2 Daily 6)  Multivitamins   Tabs (Multiple Vitamin) .... Take 1 Tablet Daily. 7)  Triamcinolone Acetonide 0.1 %  Oint (Triamcinolone Acetonide) .... Aaa Three Times A Day #one Large Tube 8)  Humalog 100 Unit/ml Soln (Insulin Lispro (Human)) .Marland KitchenMarland KitchenMarland Kitchen 15-20 U Prior To Breakfast, 15-20 U Prior To Lunch and 15-20 U Prior To Evening Meal.  Please Disp With Syringes and Needles Qs 1 Mo 9)  Flomax 0.4 Mg  Cp24 (Tamsulosin Hcl) .Marland Kitchen.. 1 By Mouth Daily 10)  Simvastatin 20 Mg Tabs (Simvastatin) .... One By Mouth Daily 11)  Prodigy Blood Glucose Monitor W/device Kit (Blood Glucose Monitoring Suppl) .... Dispense One Meter Kit 12)  Prodigy Eject Blood Glucose  Strp (Glucose Blood) .... Use As Directed 13)  Prodigy Twist Top Lancets 28g  Misc (Lancets) .... As Directed 14)  Gabapentin 300 Mg  Caps (Gabapentin) .Marland Kitchen.. 1-2 Tablets Three Times A Day 15)  Lamictal 200 Mg Tabs (Lamotrigine) .... Per Dr. Kathrynn [facility].  Take One Tablet Daily. 16)  Lithium Carbonate 300 Mg Caps (Lithium Carbonate) .... Written By Dr[facility] Kath[facility]nn [facility] in [facility]. 17)  Bd Insulin Syringe Microfine 28g X 1/2" 1 Ml Misc (Insulin Syringe-Needle U-100) .... Use Three Times A Day As Directed.  Disp Qs X1 Month. 18)  Zyprexa 5 Mg Tabs (Olanzapine) .... Take Two At Bedtime.  Per Dr. Kathrynn [facility] in [facility]. 19)  Glucagon Emergency 1 Mg Kit (Glucagon (Rdna)) .... For Use With Sugars Below 40 20)  Humalog Pen 100 Unit/ml Soln (Insulin Lispro (Human)) .... Use As Directed; Disp Qs For 60-70 Units Per Day For One Month 21)  Blood Glucose Monitor  Kit (Blood Glucose Monitoring Suppl) .... For Checking Sugars As Directed 22)  Januvia 100 Mg Tabs (Sitagliptin Phosphate) .... Once Daily  Allergies (verified): 1)  Sulfamethoxazole (Sulfamethoxazole)   Impression & Recommendations:  Problem # 1:  DIABETES MELLITUS II, UNCOMPLICATED (ICD-250.00)  His updated medication list for this problem includes:    Lisinopril 10 Mg Tabs (Lisinopril) .Marland Kitchen... 1/2 tab daily    Humalog 100 Unit/ml Soln (Insulin lispro (human)) .Marland KitchenMarland KitchenMarland KitchenMarland Kitchen 15-20 u prior to breakfast, 15-20 u prior to lunch and 15-20 u prior to evening meal.  please disp with syringes and needles qs 1 mo    Glucagon Emergency 1 Mg Kit (Glucagon (rdna)) .Marland Kitchen... For use with sugars below 40    Humalog Pen 100 Unit/ml Soln (Insulin lispro (human)) ..... Use as directed;  disp qs for 60-70 units per day for one month    Januvia 100 Mg Tabs (Sitagliptin phosphate) ..... Once daily - same time each day.  Orders: Glucose Cap-[facility] (04540)  Complete Medication List: 1)  Ambien 10 Mg Tabs (Zolpidem tartrate) .... Take 1 tablet by mouth at bedtime 2)  Lisinopril 10 Mg Tabs (Lisinopril) .... 1/2 tab daily 3)  Prilosec 20 Mg Cpdr (Omeprazole) .... Take 1 capsule by mouth once a day 4)  Cialis 20 Mg Tabs (Tadalafil) .... 1/4 tablet by mouth as needed 5)  Bystolic 5 Mg Tabs (Nebivolol hcl) .... 1/2 daily 6)  Multivitamins Tabs (Multiple vitamin) .... Take 1 tablet daily. 7)  Triamcinolone Acetonide 0.1 % Oint  (Triamcinolone acetonide) .... Aaa three times a day #one large tube 8)  Humalog 100 Unit/ml Soln (Insulin lispro (human)) .Marland KitchenMarland KitchenMarland Kitchen 15-20 u prior to breakfast, 15-20 u prior to lunch and 15-20 u prior to evening meal.  please disp with syringes and needles qs 1 mo 9)  Flomax 0.4 Mg Cp24 (Tamsulosin hcl) .Marland Kitchen.. 1 by mouth daily 10)  Simvastatin 20 Mg Tabs (Simvastatin) .... One by mouth daily 11)  Prodigy Blood Glucose Monitor W/device Kit (Blood glucose monitoring suppl) .... Dispense one meter kit 12)  Prodigy Eject Blood Glucose Strp (Glucose blood) .... Use as directed 13)  Prodigy Twist Top Lancets 28g Misc (Lancets) .... As directed 14)  Lamictal 200 Mg Tabs (Lamotrigine) .... Per dr[facility] Kathrynn Running [facility].  take one tablet daily. 15)  Lithium Carbonate 300 Mg Caps (Lithium carbonate) .... Written by dr. Kathrynn Running in [facility]. 16)  Bd Insulin Syringe Microfine 28g X 1/2" 1 Ml Misc (Insulin syringe-needle u-100) .... Use three times a day as directed.  disp qs x1 month. 17)  Zyprexa 5 Mg Tabs (Olanzapine) .... Take two at bedtime.  per dr. Kathrynn Running in [facility]. 18)  Glucagon Emergency 1 Mg Kit (Glucagon (rdna)) .... For use with sugars below 40 19)  Humalog Pen 100 Unit/ml Soln (Insulin lispro (human)) .... Use as directed; disp qs for 60-70 units per day for one month 20)  Blood Glucose Monitor Kit (Blood glucose monitoring suppl) .... For checking sugars as directed 21)  Januvia 100 Mg Tabs (Sitagliptin phosphate) .... Once daily - same time each day. 22)  Gabapentin 300 Mg Caps (Gabapentin) .... Take 900mg  three times a day  Patient Instructions: 1)  Januvia - taken once day.  2)  Decrease meal time insulin by 50-60% initially and then adjust back to previous sliding scale.  3)  Follow-up 4-6 weeks with Rx Clinic.  Prescriptions: JANUVIA 100 MG TABS (SITAGLIPTIN PHOSPHATE) once daily - same time each day.  #30 x 3   Entered by:   Christian Mate D   Authorized  by:   Myrtie Soman  MD   Signed by:   Madelon Lips Pharm D on 10/20/2009   Method used:   Electronically to        [id number] ([facility]ail)       [address]       [place], [place]  [address]       Ph: [phone] or [phone]       Fax: [phone]   RxID:   [id number] GABAPENTIN 300 MG CAPS (GABAPENTIN) Take 900mg  three times a day  #1 x 0   Entered by:   Christian Mate D  Authorized by:   Myrtie Soman  MD   Signed by:   Madelon Lips Pharm D on 10/20/2009   Method used:   Historical   RxID:   [id number] JANUVIA 100 MG TABS (SITAGLIPTIN PHOSPHATE) once daily  #1 x 0   Entered by:   Christian Mate D   Authorized by:   Myrtie Soman  MD   Signed by:   Madelon Lips Pharm D on 10/20/2009   Method used:   Electronically to        [id number] ([facility]ail)       [address]       [place], [place]  [address]       Ph: [phone] or [phone]       Fax: [phone]   RxID:   [id number]   Appended Document: [facility] Clinic     Vital Signs:  Patient profile:   50 year old male Height:      68.5 inches Weight:      163.1 pounds BMI:     24.53 Pulse rate:   71 / minute BP sitting:   143 / 74  (right arm)  Allergies: 1)  Sulfamethoxazole (Sulfamethoxazole)   Impression & Recommendations:  Problem # 1:  DIABETES MELLITUS II, UNCOMPLICATED (ICD-250.00) Assessment Unchanged  A1Cis at goal however, erratic control of blood glucose per patient report 50 -400s.  He notes his blood is black...however upon review he is sticking himself in the same (CALF) site to avoid sticking his fingers and he is likely creating a sample from a bruise.   He is willing to start Januvia to attempt a decrease in his erratic blood sugar readings.   He will decrease his sliding scale of humalog while starting Januvia and adjust based on CBGs.  His updated medication list for this problem includes:    Lisinopril  10 Mg Tabs (Lisinopril) .Marland Kitchen... 1/2 tab daily    Humalog 100 Unit/ml Soln (Insulin lispro (human)) .Marland KitchenMarland KitchenMarland KitchenMarland Kitchen 15-20 u prior to breakfast, 15-20 u prior to lunch and 15-20 u prior to evening meal.  please disp with syringes and needles qs 1 mo    Glucagon Emergency 1 Mg Kit (Glucagon (rdna)) .Marland Kitchen... For use with sugars below 40    Humalog Pen 100 Unit/ml Soln (Insulin lispro (human)) ..... Use as directed; disp qs for 60-70 units per day for one month    Januvia 100 Mg Tabs (Sitagliptin phosphate) ..... Once daily - same time each day.  Orders: Reassessment Each 15 min unit- [facility] (19622)  Problem # 2:  BIPOLAR AFFECTIVE DISORDER (ICD-296.80) Assessment: Comment Only Headache possibly related to zyprexa.   Consider alternative agent in this class to diminish metabolic dysregulation as this patient is tremendously erratic with his glycemic control.     Complete Medication List: 1)  Ambien 10 Mg Tabs (Zolpidem tartrate) .... Take 1 tablet by mouth at bedtime 2)  Lisinopril 10 Mg Tabs (Lisinopril) .... 1/2 tab daily 3)  Prilosec 20 Mg Cpdr (Omeprazole) .... Take 1 capsule by mouth once a day 4)  Cialis 20 Mg Tabs (Tadalafil) .... 1/4 tablet by mouth as needed 5)  Bystolic 5 Mg Tabs (Nebivolol hcl) .... 1/2 daily 6)  Multivitamins Tabs (Multiple vitamin) .... Take 1 tablet daily. 7)  Triamcinolone Acetonide 0.1 % Oint (Triamcinolone acetonide) .... Aaa three times a day #one large tube 8)  Humalog 100 Unit/ml Soln (Insulin  lispro (human)) .Marland KitchenMarland KitchenMarland Kitchen 15-20 u prior to breakfast, 15-20 u prior to lunch and 15-20 u prior to evening meal.  please disp with syringes and needles qs 1 mo 9)  Flomax 0.4 Mg Cp24 (Tamsulosin hcl) .Marland Kitchen.. 1 by mouth daily 10)  Simvastatin 20 Mg Tabs (Simvastatin) .... One by mouth daily 11)  Prodigy Blood Glucose Monitor W/device Kit (Blood glucose monitoring suppl) .... Dispense one meter kit 12)  Prodigy Eject Blood Glucose Strp (Glucose blood) .... Use as directed 13)  Prodigy Twist Top  Lancets 28g Misc (Lancets) .... As directed 14)  Lamictal 200 Mg Tabs (Lamotrigine) .... Per dr[facility] Kathrynn Running [facility].  take one tablet daily. 15)  Lithium Carbonate 300 Mg Caps (Lithium carbonate) .... Written by dr. Kathrynn Running in [facility]. 16)  Bd Insulin Syringe Microfine 28g X 1/2" 1 Ml Misc (Insulin syringe-needle u-100) .... Use three times a day as directed.  disp qs x1 month. 17)  Zyprexa 5 Mg Tabs (Olanzapine) .... Take two at bedtime.  per dr. Kathrynn Running in [facility]. 18)  Glucagon Emergency 1 Mg Kit (Glucagon (rdna)) .... For use with sugars below 40 19)  Humalog Pen 100 Unit/ml Soln (Insulin lispro (human)) .... Use as directed; disp qs for 60-70 units per day for one month 20)  Blood Glucose Monitor Kit (Blood glucose monitoring suppl) .... For checking sugars as directed 21)  Januvia 100 Mg Tabs (Sitagliptin phosphate) .... Once daily - same time each day. 22)  Gabapentin 300 Mg Caps (Gabapentin) .... Take 900mg  three times a day  Appended Document: cbg  134 mg/dl    Lab Visit  Laboratory Results   Blood Tests   Date/Time Received: October 20, 2009 12:04 PM  Date/Time Reported: October 20, 2009 12:41 PM  Time patient last ate: soda 9:30 - 10:00 am  Glucose (random): 134 mg/dL   (Normal Range: 78-295)  Comments: ...............test performed by......Marland KitchenBonnie A. [place], MLS (ASCP)cm    Jarrett Ables Today:

## 2010-09-21 NOTE — Progress Notes (Signed)
Summary: wants meds  Phone Note Call from Patient Call back at [phone]   Caller: Patient Summary of Call: headaches are still continuing and [facility] [place] [phone] Initial call taken by: De Nurse,  November 27, 2009 11:39 AM  Follow-up for Phone Call        tramadol called in; please call pt to advise. thanks. Follow-up by: Myrtie Soman  MD,  November 30, 2009 8:48 AM    New/Updated Medications: TRAMADOL HCL 50 MG TABS (TRAMADOL HCL) one tab by mouth every 6 hours as needed for headache Prescriptions: TRAMADOL HCL 50 MG TABS (TRAMADOL HCL) one tab by mouth every 6 hours as needed for headache  #60 x 1   Entered and Authorized by:   Myrtie Soman  MD   Signed by:   Myrtie Soman  MD on 11/30/2009   Method used:   Electronically to        [id number] ([facility])       [address]       [place], [place]  [address]       Ph: [phone] or [phone]       Fax: [phone]   RxID:   [id number]   Appended Document: wants meds Pt informed

## 2010-09-21 NOTE — Assessment & Plan Note (Signed)
Summary: [facility]   Primary Care Provider:  Angelena Sole MD   History of Present Illness: Patrick Elliott's list today:  1) Headaches.  He has discussed this with his PCP.  The tramadol does not appear to be working.  Describes them as band like headaches.  Not associated with photophobia or phonophobia.  Does have nausea.  2) Mood swings.  Having a lot of lows.  Lack of interest and feeling blah.  3) Difficulty focusing.  Finds himself wandering off.  Does not note this to be a problem from childhood.    Gave feedback on Lithium level.  It is low at 0.35.  Discussed options keeping in mind that he got toxic (1.75) on 600 mg of Lithium.  Allergies: 1)  Sulfamethoxazole (Sulfamethoxazole)   Impression & Recommendations:  Problem # 1:  BIPOLAR AFFECTIVE DISORDER (ICD-296.80)  Report of mood is depressed.  Affect is within normal limits.  He smiles on occasion.  He brings some artwork in today.  The way he describes his emotional lability is like having 20 days rolled into one.  It constantly shifts in terms of mood and energy.    Dr. Kathrynn Running recommended he increase to 450 mg of Lithium to see if we can capture some more of these symptoms.  Will need to follow closely.  Discussed things to look out for including tremor, gait instability, mental confusion, nausea and diarrhea.    Will look at attention / focus issues if and when mood control is better.  Executive functioning was likely impacted by both mood disorder and drug use.  Will check Lithium level in two weeks.  He has 450 mg tabs at home.  Orders: Therapy 20-30 min- [facility] ([id number])Future Orders: Miscellaneous Lab Charge-[facility] [id number]) ... 02/24/2011  Complete Medication List: 1)  Ambien 10 Mg Tabs (Zolpidem tartrate) .... Take 1 tablet by mouth at bedtime 2)  Lisinopril 10 Mg Tabs (Lisinopril) .... 1/2 tab daily 3)  Prilosec 20 Mg Cpdr (Omeprazole) .... Take 1 capsule by mouth once a day 4)  Cialis 20 Mg Tabs (Tadalafil)  .... 1/4 tablet by mouth as needed 5)  Bystolic 5 Mg Tabs (Nebivolol hcl) .... 1/2 daily 6)  Multivitamins Tabs (Multiple vitamin) .... Take 1 tablet daily. 7)  Triamcinolone Acetonide 0.1 % Oint (Triamcinolone acetonide) .... Aaa three times a day #one large tube 8)  Humalog 100 Unit/ml Soln (Insulin lispro (human)) .Marland KitchenMarland KitchenMarland Kitchen 15-20 u prior to breakfast, 15-20 u prior to lunch and 15-20 u prior to evening meal.  please disp with syringes and needles qs 1 mo 9)  Flomax 0.4 Mg Cp24 (Tamsulosin hcl) .Marland Kitchen.. 1 by mouth daily 10)  Simvastatin 20 Mg Tabs (Simvastatin) .... One by mouth daily 11)  Prodigy Blood Glucose Monitor W/device Kit (Blood glucose monitoring suppl) .... Dispense one meter kit 12)  Prodigy Eject Blood Glucose Strp (Glucose blood) .... Use as directed 13)  Prodigy Twist Top Lancets 28g Misc (Lancets) .... As directed 14)  Lithium Carbonate 300 Mg Caps (Lithium carbonate) .... Written by dr. Kathrynn Running in mood disorder clinic. 15)  Bd Insulin Syringe Microfine 28g X 1/2" 1 Ml Misc (Insulin syringe-needle u-100) .... Use three times a day as directed.  disp qs x1 month. 16)  Glucagon Emergency 1 Mg Kit (Glucagon (rdna)) .... For use with sugars below 40 17)  Humalog Pen 100 Unit/ml Soln (Insulin lispro (human)) .... Use as directed; disp qs for 60-70 units per day for one month 18)  Blood Glucose Monitor  Kit (Blood glucose monitoring suppl) .... For checking sugars as directed 19)  Januvia 100 Mg Tabs (Sitagliptin phosphate) .... Once daily - same time each day. 20)  Gabapentin 300 Mg Caps (Gabapentin) .... Take 900mg  three times a day 21)  Tramadol Hcl 50 Mg Tabs (Tramadol hcl) .... One tab by mouth every 6 hours as needed for headache 22)  Depakote Er 250 Mg Tb24 (Divalproex sodium (migraine)) .... Take two daily with food.  per dr. Kathrynn Running in mood disorder clinic. 23)  Patanol 0.1 % Soln (Olopatadine hcl) .Marland Kitchen.. 1 drop each eye two times a day until cleared.  disp 1 bottle.  Patient  Instructions: 1)  Please schedule a follow-up for June 27th at 11:00 am. 2)  You can increase your Lithium to 450 mg.  Please monitor for toxicity although Dr. Kathrynn Running does not suspect it will be a problem.  Watch out for tremors, mental confusion, nausea, gait instability and diarrhea.

## 2010-09-21 NOTE — Assessment & Plan Note (Signed)
Summary: [facility] follow-up   Primary Care Provider:  Myrtie Soman  MD   History of Present Illness: Patrick Elliott is taking 500 mg of Depakote.  He was told to increase to 750 mg based on a Depakote level but he did not make this change.  Subsequent draw was therapeutic at 500 mg.  We reviewed his other labs and gave feedback.  He continues reporting very bad headaches and blurry vision which he also describes as double vision.  He has two different brands of the generic Depakote and thinks this might be playing a role.  He reports that the vision issue is so distractable that he is unable to report on his mood issues.  He is irritable and this might be related to the [facility] or independent - he can not say.    Allergies: 1)  Sulfamethoxazole (Sulfamethoxazole)   Impression & Recommendations:  Problem # 1:  BIPOLAR AFFECTIVE DISORDER (ICD-296.80) Patrick Elliott has energy today.  Report of mood is irritable.  Unable to report beyond that.  Unable to guage mood lability since last appointment secondary to this side effect that has occupied his mind.  Discussed options.  Dr. Kathrynn Running thinks the Lamictal is the best place to make a change to see if we can attenuate the blurry vision and the headache.  Safety with regards to the Depakote has been documented per stable liver function and platelets.  See patient instructions for further plan.  Orders: Therapy 20-30 min- [facility] ([id number])  Complete Medication List: 1)  Ambien 10 Mg Tabs (Zolpidem tartrate) .... Take 1 tablet by mouth at bedtime 2)  Lisinopril 10 Mg Tabs (Lisinopril) .... 1/2 tab daily 3)  Prilosec 20 Mg Cpdr (Omeprazole) .... Take 1 capsule by mouth once a day 4)  Cialis 20 Mg Tabs (Tadalafil) .... 1/4 tablet by mouth as needed 5)  Bystolic 5 Mg Tabs (Nebivolol hcl) .... 1/2 daily 6)  Multivitamins Tabs (Multiple vitamin) .... Take 1 tablet daily. 7)  Triamcinolone Acetonide 0.1 % Oint (Triamcinolone acetonide) .... Aaa three times a day #one  large tube 8)  Humalog 100 Unit/ml Soln (Insulin lispro (human)) .Marland KitchenMarland KitchenMarland Kitchen 15-20 u prior to breakfast, 15-20 u prior to lunch and 15-20 u prior to evening meal.  please disp with syringes and needles qs 1 mo 9)  Flomax 0.4 Mg Cp24 (Tamsulosin hcl) .Marland Kitchen.. 1 by mouth daily 10)  Simvastatin 20 Mg Tabs (Simvastatin) .... One by mouth daily 11)  Prodigy Blood Glucose Monitor W/device Kit (Blood glucose monitoring suppl) .... Dispense one meter kit 12)  Prodigy Eject Blood Glucose Strp (Glucose blood) .... Use as directed 13)  Prodigy Twist Top Lancets 28g Misc (Lancets) .... As directed 14)  Lamictal 200 Mg Tabs (Lamotrigine) .... Per dr. Kathrynn Running mood disorder clinic.  take one tablet daily. 15)  Lithium Carbonate 300 Mg Caps (Lithium carbonate) .... Written by dr. Kathrynn Running in mood disorder clinic. 16)  Bd Insulin Syringe Microfine 28g X 1/2" 1 Ml Misc (Insulin syringe-needle u-100) .... Use three times a day as directed.  disp qs x1 month. 17)  Glucagon Emergency 1 Mg Kit (Glucagon (rdna)) .... For use with sugars below 40 18)  Humalog Pen 100 Unit/ml Soln (Insulin lispro (human)) .... Use as directed; disp qs for 60-70 units per day for one month 19)  Blood Glucose Monitor Kit (Blood glucose monitoring suppl) .... For checking sugars as directed 20)  Januvia 100 Mg Tabs (Sitagliptin phosphate) .... Once daily - same time each day. 21)  Gabapentin  300 Mg Caps (Gabapentin) .... Take 900mg  three times a day 22)  Tramadol Hcl 50 Mg Tabs (Tramadol hcl) .... One tab by mouth every 6 hours as needed for headache 23)  Depakote Er 250 Mg Tb24 (Divalproex sodium (migraine)) .... Take one daily with food.  per dr. Kathrynn Running in mood disorder clinic.  Patient Instructions: 1)  Please follow-up in [facility] on:  June 15th at 11:30. 2)  Dr. Kathrynn Running recommended stopping the Lamictal for two days and then restarting it at 50 mg. 3)  The Depakote stays the same at 500 mg.  The Lithium stays the same at 300 mg.     4)  We can go to two weeks for repeat blood work (June 10th)  now that we have seen you tolerate a full dose of Depakote (in that he has reached a  therapeutic range). 5)  You have Dr. Broadus John cell phone number.  If you have serious issues, you can call him.

## 2010-09-21 NOTE — Progress Notes (Signed)
  Phone Note Call from Patient   Caller: Patient Call For: [phone] Summary of Call: Mr. Rastetter requesting a different med prescribed for his headaches.   The Sumatriptan Succinate is not helping.  Still having pain. Initial call taken by: Abundio Miu,  June 02, 2010 10:01 AM  Follow-up for Phone Call        If he is still having a headache, he needs to schedule an office visit. Follow-up by: Angelena Sole MD,  June 02, 2010 10:49 AM

## 2010-09-21 NOTE — Assessment & Plan Note (Signed)
Summary: [facility] follow-up   Primary Care Provider:  Angelena Sole MD   History of Present Illness: Patrick Elliott reports his focus is a little better.  He reports he thinks that he is less productive, however, in the last month.  He put rapid thoughts on the agenda for today but when asked to describe how he knows he is more focused, he states his thoughts are not as rapid.  He is able to think things through longer.    No nausea.  No increase in insomnia on the medicine.  Denies problems with the medicine with the possible exception of this snapping thing with his fingers.  Allergies: 1)  Sulfamethoxazole (Sulfamethoxazole)   Impression & Recommendations:  Problem # 1:  ADD (ICD-314.00)  Patrick Elliott appears to have derived some benefit on the Vyvanse without significant side effect.  The finger snapping may be related.  Patrick Elliott will pay attention to whether it decreases in the evening after the medicine has worn off.  If there are no other significant downsides, Dr. Kathrynn Running suggested increasing the Vyvanse to 70 mg to see if more of the focus can be captured.  Patrick Elliott agreed with this plan.   Orders: Therapy 20-30 min- [facility] ([id number])  Problem # 2:  BIPOLAR AFFECTIVE DISORDER (ICD-296.80)  Will not make changes in mood medications.  Dr. Kathrynn Running has determined that we have gotten maximum benefit from current regimen and have exhausted all options available to [place] in [facility].  Will continue to track attention / focus issue (see above).  Orders: Therapy 20-30 min- [facility] ([id number])  Complete Medication List: 1)  Ambien 10 Mg Tabs (Zolpidem tartrate) .... Take 1 tablet by mouth at bedtime 2)  Lisinopril 10 Mg Tabs (Lisinopril) .... 1/2 tab daily 3)  Prilosec 20 Mg Cpdr (Omeprazole) .... Take 1 capsule by mouth once a day 4)  Cialis 20 Mg Tabs (Tadalafil) .... 1/4 tablet by mouth as needed 5)  Bystolic 5 Mg Tabs (Nebivolol hcl) .... 1/2 daily 6)  Multivitamins Tabs (Multiple vitamin) .... Take 1  tablet daily. 7)  Triamcinolone Acetonide 0.1 % Oint (Triamcinolone acetonide) .... Aaa three times a day #one large tube 8)  Humalog 100 Unit/ml Soln (Insulin lispro (human)) .Marland KitchenMarland KitchenMarland Kitchen 15-20 u prior to breakfast, 15-20 u prior to lunch and 15-20 u prior to evening meal.  please disp with syringes and needles qs 1 mo 9)  Flomax 0.4 Mg Cp24 (Tamsulosin hcl) .Marland Kitchen.. 1 by mouth daily 10)  Simvastatin 20 Mg Tabs (Simvastatin) .... One by mouth daily 11)  Prodigy Blood Glucose Monitor W/device Kit (Blood glucose monitoring suppl) .... Dispense one meter kit 12)  Prodigy Eject Blood Glucose Strp (Glucose blood) .... Use as directed 13)  Prodigy Twist Top Lancets 28g Misc (Lancets) .... As directed 14)  Lithium Carbonate 300 Mg Caps (Lithium carbonate) .... Written by dr. Kathrynn Running in mood disorder clinic. 15)  Bd Insulin Syringe Microfine 28g X 1/2" 1 Ml Misc (Insulin syringe-needle u-100) .... Use three times a day as directed.  disp qs x1 month. 16)  Glucagon Emergency 1 Mg Kit (Glucagon (rdna)) .... For use with sugars below 40 17)  Humalog Pen 100 Unit/ml Soln (Insulin lispro (human)) .... Use as directed; disp qs for 60-70 units per day for one month 18)  Blood Glucose Monitor Kit (Blood glucose monitoring suppl) .... For checking sugars as directed 19)  Januvia 100 Mg Tabs (Sitagliptin phosphate) .... Once daily - same time each day. 20)  Gabapentin 300 Mg Caps (  Gabapentin) .... Take 900mg  three times a day 21)  Tramadol Hcl 50 Mg Tabs (Tramadol hcl) .... One tab by mouth every 6 hours as needed for headache 22)  Patanol 0.1 % Soln (Olopatadine hcl) .Marland Kitchen.. 1 drop each eye two times a day until cleared.  disp 1 bottle. 23)  Vyvanse 70 Mg Caps (Lisdexamfetamine dimesylate) .... Take one pill in the morning.  per dr. Kathrynn Running in mood disorder clinic.  Patient Instructions: 1)  Dr. Kathrynn Running is increasing your Vyvanse to 70 mg.  If you notice any unpleasant effects, please call Dr. Kathrynn Running at the number you  have. 2)  Please schedule a follow-up for: October 26th at 11:30. Prescriptions: VYVANSE 70 MG CAPS (LISDEXAMFETAMINE DIMESYLATE) Take one pill in the morning.  Per Dr. Kathrynn Running in [facility].  #30 x 0   Entered and Authorized by:   [name] PsyD   Signed by:   [name] PsyD on 05/19/2010   Method used:   Handwritten   RxID:   [id number]

## 2010-09-21 NOTE — Assessment & Plan Note (Signed)
Summary: [facility] follow-up   Primary Care Provider:  Lupita Raider MD   History of Present Illness: Patrick Elliott presents with [name] again.  He does not have an agenda today.  Ours included check on medication (he is taking 5 mg of Zyprexa, Lamictal 200 mg and Ambien occasionally), mood and function.  He has noticed fewer peaks and valleys.  No real change in racing thoughts.  Sleep is about the same.  No noticeable change in blood sugars with the exception of a few more highs but that could be attributed to the holidays (per his report).     Allergies: 1)  Sulfamethoxazole (Sulfamethoxazole)   Impression & Recommendations:  Problem # 1:  BIPOLAR AFFECTIVE DISORDER (ICD-296.80) Affect seems wnl.  He seems a little less energized today.  More subdued.  His thoughts seem slower despite his report of racing thoughts.  Clear and goal directed.  No evidence of SI / HI.  Discussed role of medicine in better mood control and reducing speed of thoughts.  Also suggested eventually he will have to decide how much he pays attention to variation in his mood as he will likely always have to tolerate some variation (likened it to how closely he follows his blood sugars).  Decided to increase the Zyprexa to 7.5 (rather than 10) to limit any sensitivities.  Reiterated the idea that we are looking for an optimal balance of benefit and tolerability.  Will see in one month to follow.  Orders: Therapy 20-30 min- [facility] ([id number])  Complete Medication List: 1)  Ambien 10 Mg Tabs (Zolpidem tartrate) .... Take 1 tablet by mouth at bedtime 2)  Lisinopril 10 Mg Tabs (Lisinopril) .... 1/2 tab daily 3)  Prilosec 20 Mg Cpdr (Omeprazole) .... Take 1 capsule by mouth once a day 4)  Cialis 20 Mg Tabs (Tadalafil) .... 1/4 tablet by mouth as needed 5)  Bystolic 5 Mg Tabs (Nebivolol hcl) .... 1/2 daily 6)  Multivitamins Tabs (Multiple vitamin) .... Take 1 tablet daily. 7)  Triamcinolone Acetonide 0.1 % Oint (Triamcinolone  acetonide) .... Aaa three times a day #one large tube 8)  Humalog 100 Unit/ml Soln (Insulin lispro (human)) .Marland KitchenMarland KitchenMarland Kitchen 15-20 u prior to breakfast, 15-20 u prior to lunch and 15-20 u prior to evening meal.  please disp with syringes and needles qs 1 mo 9)  Flomax 0.4 Mg Cp24 (Tamsulosin hcl) .Marland Kitchen.. 1 by mouth daily 10)  Simvastatin 20 Mg Tabs (Simvastatin) .... One by mouth daily 11)  Prodigy Blood Glucose Monitor W/device Kit (Blood glucose monitoring suppl) .... Dispense one meter kit 12)  Prodigy Eject Blood Glucose Strp (Glucose blood) .... Use as directed 13)  Prodigy Twist Top Lancets 28g Misc (Lancets) .... As directed 14)  Gabapentin 300 Mg Caps (Gabapentin) .Marland Kitchen.. 1-2 tablets three times a day 15)  Lamictal 200 Mg Tabs (Lamotrigine) .... Per dr. Kathrynn Running [facility] disorder clinic.  take one tablet daily. 16)  Lithium Carbonate 300 Mg Caps (Lithium carbonate) .... Written by dr. Kathrynn Running in mood disorder clinic. 17)  Bd Insulin Syringe Microfine 28g X 1/2" 1 Ml Misc (Insulin syringe-needle u-100) .... Use three times a day as directed.  disp qs x1 month. 18)  Zyprexa 7.5 Mg Tabs (Olanzapine) .... Take one at bedtime.  per dr. Kathrynn Running in mood disorder clinic.  Patient Instructions: 1)  Please schedule a follow-up for:  February 2nd at 10:00. 2)  Call us at [phone] with questions or concerns. 3)  Happy New Year. Prescriptions: ZYPREXA 7.5 MG TABS (OLANZAPINE)  Take one at bedtime.  Per Dr. Kathrynn Running in [facility].  #30 x 1   Entered and Authorized by:   [name] PsyD   Signed by:   [name] PsyD on 08/26/2009   Method used:   Handwritten   RxID:   [id number]

## 2010-09-21 NOTE — Assessment & Plan Note (Signed)
Summary: OV/MJ   Vital Signs:  Patient profile:   49 year old male Height:      69 inches Weight:      168.25 pounds BMI:     24.94 BSA:     1.92 Pulse rate:   68 / minute BP sitting:   117 / 70  Vitals Entered By: Jone Baseman CMA (June 16, 2010 1:38 PM) CC: f/u Is Patient Diabetic? Yes Pain Assessment Patient in pain? yes     Location: legs Intensity: 7   Primary Care Cortne Amara:  Angelena Sole MD  CC:  f/u.  History of Present Illness: 1. Headache:   - Off and on for the past 4 months - Has been having them 5 days out of the week - Last for a couple of hours and then go away - Located on the left side of his head - Associated with nausea and phonophobia, not associated with photophobia - Ultram helped a little.  Imitrex didn't help at all  ROS: denies vision changes, balance problems  2. Leg tingling - Been there for about 1 month - Described as "pins and needles" starting at his knees and going down into his feet  ROS:  No numbness / weakness  3. Left foot pain - Here injured his left foot a long time ago - He has a bone that didn't heal right on the outside of his left foot - He has tried custom orthotics before but they didn't help - He gets pain at the bottom of his left foot where the old bony injury was   Habits & Providers  Alcohol-Tobacco-Diet     Tobacco Status: never     Year Quit: 2007     Pack years: 0     Passive Smoke Exposure: no  Current Medications (verified): 1)  Ambien 10 Mg Tabs (Zolpidem Tartrate) .... Take 1 Tablet By Mouth At Bedtime 2)  Lisinopril 10 Mg  Tabs (Lisinopril) .... 1/2 Tab Daily 3)  Cialis 20 Mg Tabs (Tadalafil) .... 1/4 Tablet By Mouth As Needed 4)  Bystolic 5 Mg  Tabs (Nebivolol Hcl) .... 1/2 Daily 5)  Multivitamins   Tabs (Multiple Vitamin) .... Take 1 Tablet Daily. 6)  Triamcinolone Acetonide 0.1 %  Oint (Triamcinolone Acetonide) .... Aaa Three Times A Day #one Large Tube 7)  Humalog 100 Unit/ml Soln  (Insulin Lispro (Human)) .Marland KitchenMarland KitchenMarland Kitchen 15-20 U Prior To Breakfast, 15-20 U Prior To Lunch and 15-20 U Prior To Evening Meal.  Please Disp With Syringes and Needles Qs 1 Mo 8)  Flomax 0.4 Mg  Cp24 (Tamsulosin Hcl) .Marland Kitchen.. 1 By Mouth Daily 9)  Simvastatin 20 Mg Tabs (Simvastatin) .... One By Mouth Daily 10)  Prodigy Blood Glucose Monitor W/device Kit (Blood Glucose Monitoring Suppl) .... Dispense One Meter Kit 11)  Prodigy Eject Blood Glucose  Strp (Glucose Blood) .... Use As Directed 12)  Prodigy Twist Top Lancets 28g  Misc (Lancets) .... As Directed 13)  Lithium Carbonate 300 Mg Caps (Lithium Carbonate) .... Written By Dr[facility]. 14)  Bd Insulin Syringe Microfine 28g X 1/2" 1 Ml Misc (Insulin Syringe-Needle U-100) .... Use Three Times A Day As Directed.  Disp Qs X1 Month. 15)  Glucagon Emergency 1 Mg Kit (Glucagon (Rdna)) .... For Use With Sugars Below 40 16)  Humalog Pen 100 Unit/ml Soln (Insulin Lispro (Human)) .... Use As Directed; Disp Qs For 60-70 Units Per Day For One Month 17)  Blood Glucose Monitor  Kit (Blood  Glucose Monitoring Suppl) .... For Checking Sugars As Directed 18)  Januvia 100 Mg Tabs (Sitagliptin Phosphate) .... Once Daily - Same Time Each Day. 19)  Gabapentin 300 Mg Caps (Gabapentin) .... Take 900mg  Three Times A Day 20)  Sumatriptan Succinate 50 Mg Tabs (Sumatriptan Succinate) .... Take 1 Tab Daily As Needed For Headaches, May Repeat X 1 If Needed.  Do Not Take More Than Twice Per Week. 21)  Patanol 0.1 % Soln (Olopatadine Hcl) .Marland Kitchen.. 1 Drop Each Eye Two Times A Day Until Cleared.  Disp 1 Bottle. 22)  Vyvanse 70 Mg Caps (Lisdexamfetamine Dimesylate) .... Take One Pill in The Morning.  Per Dr. [facility]nn [facility] in [facility]. 23)  Prilosec 20 Mg Cpdr (Omeprazole) .Marland Kitchen.. 1 Tab By Mouth Daily 24)  Lamictal 25 Mg Tabs (Lamotrigine) .... Take Two Pills Twice A Day (See Patient Instructions For Start-Up) 25)  Tramadol Hcl 50 Mg Tabs (Tramadol Hcl) .Marland Kitchen.. 1 Tab By Mouth  Every 6 Hours As Needed For Headache  Allergies: 1)  Sulfamethoxazole (Sulfamethoxazole)  Past History:  Past Medical History: Reviewed history from 01/22/2009 and no changes required. 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 Has used every drug in the book except IV ones but quit 2004.  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  Quit tob 04/2006.  Sexually active, uses condoms. Parents very involved.   Physical Exam  General:  vitals reviewed.  no acute distress Head:  normocephalic and atraumatic.   Eyes:  vision grossly intact.  PERRL.  EOMI Ears:  R ear normal and L ear normal.   Neck:  supple, full ROM, and no masses.   Lungs:  Normal respiratory effort, chest expands symmetrically. Lungs are clear to auscultation, no crackles or wheezes. Heart:  normal rate, regular rhythm, and no murmur.   Msk:  Left foot:  Bony callus at the left 5th metatarsal bone.  Significant skin callus formation underneath that old injury site.  Pain on the plantar surface. Extremities:  no LE edema Neurologic:  alert & oriented X3, cranial nerves II-XII intact, strength normal in all extremities, and gait normal.  Sensation is decreased to light touch from the knees down bilaterally. Psych:  Oriented X3, normally interactive, not anxious appearing, and not depressed appearing.     Impression & Recommendations:  Problem # 1:  HEADACHE (ICD-784.0) Assessment Unchanged Headacher persists.  Triptan didn't help.  Will go back to Tramadol since he does remember that helping some.  Will send to headache wellness center. His updated  medication list for this problem includes:    Bystolic 5 Mg Tabs (Nebivolol hcl) .Marland Kitchen... 1/2 daily    Sumatriptan Succinate 50 Mg Tabs (Sumatriptan succinate) .Marland Kitchen... Take 1 tab daily as needed for headaches, may repeat x 1 if needed.  do not take more than twice per week.    Tramadol Hcl 50 Mg Tabs (Tramadol hcl) .Marland Kitchen... 1 tab by mouth every 6 hours as needed for headache  Orders: Magnesium-[facility] ([id number]) Headache Clinic Referral (Headache) [facility]- Est  Level 4 (56213)  Problem # 2:  FOOT PAIN, LEFT (ICD-729.5) Assessment: New Likely from the old injury and the increased pressure on the plantar surface of  his left foot.  Will send to sports medicine for evaluation for custom orthotics vs a pad to take pressure off of that spot. Orders: Sports Medicine (Sports Med) [facility]- Est  Level 4 [phone])  Problem # 3:  PERIPHERAL NEUROPATHY (ICD-356.9) Diagnosed with DM neuropathy before.  His blood sugars are well controlled.  I am not sure if that is what is causing it or not.  Will check some labs to see if there could be something else related. Orders: Vit D, 25 OH-[facility] [phone]) TSH-[facility] [id number]) B12-[facility] [phone]) [facility]- Est  Level 4 (84132)  Complete Medication List: 1)  Ambien 10 Mg Tabs (Zolpidem tartrate) .... Take 1 tablet by mouth at bedtime 2)  Lisinopril 10 Mg Tabs (Lisinopril) .... 1/2 tab daily 3)  Cialis 20 Mg Tabs (Tadalafil) .... 1/4 tablet by mouth as needed 4)  Bystolic 5 Mg Tabs (Nebivolol hcl) .... 1/2 daily 5)  Multivitamins Tabs (Multiple vitamin) .... Take 1 tablet daily. 6)  Triamcinolone Acetonide 0.1 % Oint (Triamcinolone acetonide) .... Aaa three times a day #one large tube 7)  Humalog 100 Unit/ml Soln (Insulin lispro (human)) .Marland KitchenMarland KitchenMarland Kitchen 15-20 u prior to breakfast, 15-20 u prior to lunch and 15-20 u prior to evening meal.  please disp with syringes and needles qs 1 mo 8)  Flomax 0.4 Mg Cp24 (Tamsulosin hcl) .Marland Kitchen.. 1 by mouth daily 9)  Simvastatin 20 Mg Tabs (Simvastatin)  .... One by mouth daily 10)  Prodigy Blood Glucose Monitor W/device Kit (Blood glucose monitoring suppl) .... Dispense one meter kit 11)  Prodigy Eject Blood Glucose Strp (Glucose blood) .... Use as directed 12)  Prodigy Twist Top Lancets 28g Misc (Lancets) .... As directed 13)  Lithium Carbonate 300 Mg Caps (Lithium carbonate) .... Written by dr. Kathrynn Running in [facility]. 14)  Bd Insulin Syringe Microfine 28g X 1/2" 1 Ml Misc (Insulin syringe-needle u-100) .... Use three times a day as directed.  disp qs x1 month. 15)  Glucagon Emergency 1 Mg Kit (Glucagon (rdna)) .... For use with sugars below 40 16)  Humalog Pen 100 Unit/ml Soln (Insulin lispro (human)) .... Use as directed; disp qs for 60-70 units per day for one month 17)  Blood Glucose Monitor Kit (Blood glucose monitoring suppl) .... For checking sugars as directed 18)  Januvia 100 Mg Tabs (Sitagliptin phosphate) .... Once daily - same time each day. 19)  Gabapentin 300 Mg Caps (Gabapentin) .... Take 900mg  three times a day 20)  Sumatriptan Succinate 50 Mg Tabs (Sumatriptan succinate) .... Take 1 tab daily as needed for headaches, may repeat x 1 if needed.  do not take more than twice per week. 21)  Patanol 0.1 % Soln (Olopatadine hcl) .Marland Kitchen.. 1 drop each eye two times a day until cleared.  disp 1 bottle. 22)  Vyvanse 70 Mg Caps (Lisdexamfetamine dimesylate) .... Take one pill in the morning.  per dr. Kathrynn Running in [facility]. 23)  Prilosec 20 Mg Cpdr (Omeprazole) .Marland Kitchen.. 1 tab by mouth daily 24)  Lamictal 25 Mg Tabs (Lamotrigine) .... Take two pills twice a day (see patient instructions for start-up) 25)  Tramadol Hcl 50 Mg Tabs (Tramadol hcl) .Marland Kitchen.. 1 tab by mouth every 6 hours as needed for headache  Other Orders: Flu Vaccine 39yrs + (44010) Admin 1st Vaccine (27253)  Patient Instructions: 1)  It was good to see you again today 2)  We will keep working on your headache and other issues 3)  For your headache we are going to  send you to the [facility].  I have also sent in a prescription for Tramadol. 4)  For your foot pain, please schedule an appointment up front with the Sports Medicine clinic. 5)  We will get some more labs done today for your leg numbness. 6)  Please schedule a follow up in 6 weeks Prescriptions: TRAMADOL HCL 50 MG TABS (TRAMADOL HCL) 1 tab by mouth every 6 hours as needed for headache  #60 x 1   Entered and Authorized by:   Angelena Sole MD   Signed by:   Angelena Sole MD on 06/16/2010   Method used:   Electronically to        [id number] (retail)       [address]       [place], [place]  [address]       Ph: [phone] or [phone]       Fax: [phone]   RxID:   [id number]    Orders Added: 1)  Magnesium-[facility] [[id number]] 2)  Vit D, 25 OH-[facility] [phone] 3)  TSH-[facility] [[id number]] 4)  B12-[facility] [[id number]] 5)  Headache Clinic Referral [Headache] 6)  Sports Medicine [Sports Med] 7)  Flu Vaccine 48yrs + [90658] 8)  Admin 1st Vaccine [90471] 9)  [facility]- Est  Level 4 [16606]   Immunizations Administered:  Influenza Vaccine # 1:    Vaccine Type: Fluvax 3+    Site: left deltoid    Mfr: GlaxoSmithKline    Dose: 0.5 ml    Route: IM    Given by: Jone Baseman CMA    Exp. Date: 02/16/2011    Lot #: [id number]    VIS given: 03/16/10 version given June 16, 2010.  Flu Vaccine Consent Questions:    Do you have a history of severe allergic reactions to this vaccine? no    Any prior history of allergic reactions to egg and/or gelatin? no    Do you have a sensitivity to the preservative Thimersol? no    Do you have a past history of Guillan-Barre Syndrome? no    Do you currently have an acute febrile illness? no    Have you ever had a severe reaction to latex? no    Vaccine information given and explained to patient? yes   Immunizations Administered:  Influenza Vaccine # 1:    Vaccine Type: Fluvax 3+    Site: left  deltoid    Mfr: GlaxoSmithKline    Dose: 0.5 ml    Route: IM    Given by: Jone Baseman CMA    Exp. Date: 02/16/2011    Lot #: [id number]    VIS given: 03/16/10 version given June 16, 2010.

## 2010-09-21 NOTE — Miscellaneous (Signed)
Summary: walk in  Clinical Lists Changes camein c./or pink eye x 1 wk. feels like "there is something in there" sclera light pink. has had crusting last 2 mornings. work in at [address] RN  February 03, 2010 11:43 AM

## 2010-09-21 NOTE — Assessment & Plan Note (Signed)
Summary: refill meds,df   Vital Signs:  Patient profile:   50 year old male Height:      69 inches Weight:      157 pounds BMI:     23.27 BSA:     1.87 Temp:     98.9 degrees F Pulse rate:   68 / minute BP sitting:   107 / 72  Vitals Entered By: Jone Baseman CMA (February 12, 2010 11:37 AM) CC: med refills Is Patient Diabetic? Yes Did you bring your meter with you today? No Pain Assessment Patient in pain? yes     Location: arms knees and ankles Intensity: 10   Primary Care Provider:  Angelena Sole MD  CC:  med refills.  History of Present Illness: 1. headaches Worse in the morning. Has been a chronic issue for several months. No throbbing but feels a constant pressure frontally and at the temples. Almost every day. Tramadol seems to help but only takes bid. Lamictal was recently stopped to help with this but pt has not noticed much improvement except for cessation of visual disturbances. . Pt also experiences nausea with these headaches. No visual aura.   2. Diabetes taking medications: yes problems with medications?: no  blood sugar testing frequency: 4 or more times per day hypoglycemic events?: occasionally; treats these with sugar or glucagon; less frequent on januvia.  subjective: doing well with the addition of Januvia ROS chest pain: no   shortness of breath: occasional    3. rash Has had an itchy red rash on lower back for the last week or so. Has not done anything to make it better.  Burns after scratching.   Habits & Providers  Alcohol-Tobacco-Diet     Tobacco Status: never  Current Medications (verified): 1)  Ambien 10 Mg Tabs (Zolpidem Tartrate) .... Take 1 Tablet By Mouth At Bedtime 2)  Lisinopril 10 Mg  Tabs (Lisinopril) .... 1/2 Tab Daily 3)  Prilosec 20 Mg Cpdr (Omeprazole) .... Take 1 Capsule By Mouth Once A Day 4)  Cialis 20 Mg Tabs (Tadalafil) .... 1/4 Tablet By Mouth As Needed 5)  Bystolic 5 Mg  Tabs (Nebivolol Hcl) .... 1/2 Daily 6)   Multivitamins   Tabs (Multiple Vitamin) .... Take 1 Tablet Daily. 7)  Triamcinolone Acetonide 0.1 %  Oint (Triamcinolone Acetonide) .... Aaa Three Times A Day #one Large Tube 8)  Humalog 100 Unit/ml Soln (Insulin Lispro (Human)) .Marland KitchenMarland KitchenMarland Kitchen 15-20 U Prior To Breakfast, 15-20 U Prior To Lunch and 15-20 U Prior To Evening Meal.  Please Disp With Syringes and Needles Qs 1 Mo 9)  Flomax 0.4 Mg  Cp24 (Tamsulosin Hcl) .Marland Kitchen.. 1 By Mouth Daily 10)  Simvastatin 20 Mg Tabs (Simvastatin) .... One By Mouth Daily 11)  Prodigy Blood Glucose Monitor W/device Kit (Blood Glucose Monitoring Suppl) .... Dispense One Meter Kit 12)  Prodigy Eject Blood Glucose  Strp (Glucose Blood) .... Use As Directed 13)  Prodigy Twist Top Lancets 28g  Misc (Lancets) .... As Directed 14)  Lithium Carbonate 300 Mg Caps (Lithium Carbonate) .... Written By Dr. Kath[facility]nn [facility] in [facility]. 15)  Bd Insulin Syringe Microfine 28g X 1/2" 1 Ml Misc (Insulin Syringe-Needle U-100) .... Use Three Times A Day As Directed.  Disp Qs X1 Month. 16)  Glucagon Emergency 1 Mg Kit (Glucagon (Rdna)) .... For Use With Sugars Below 40 17)  Humalog Pen 100 Unit/ml Soln (Insulin Lispro (Human)) .... Use As Directed; Disp Qs For 60-70 Units Per Day For One Month 18)  Blood Glucose Monitor  Kit (Blood Glucose Monitoring Suppl) .... For Checking Sugars As Directed 19)  Januvia 100 Mg Tabs (Sitagliptin Phosphate) .... Once Daily - Same Time Each Day. 20)  Gabapentin 300 Mg Caps (Gabapentin) .... Take 900mg  Three Times A Day 21)  Tramadol Hcl 50 Mg Tabs (Tramadol Hcl) .... One Tab By Mouth Every 6 Hours As Needed For Headache 22)  Depakote Er 250 Mg Tb24 (Divalproex Sodium (Migraine)) .... Take Two Daily With Food.  Per Dr. Kathrynn Running in [facility]. 23)  Patanol 0.1 % Soln (Olopatadine Hcl) .Marland Kitchen.. 1 Drop Each Eye Two Times A Day Until Cleared.  Disp 1 Bottle.  Allergies (verified): 1)  Sulfamethoxazole (Sulfamethoxazole)  Review of Systems       review of  systems as noted in HPI section   Physical Exam  General:  vital signs reviewed and normal Alert, appropriate; well-dressed and well-nourished  Eyes:  PERRL, EOMI, fundoscopic exam unremarkable  Lungs:  work of breathing unlabored, clear to auscultation bilaterally; no wheezes, rales, or ronchi; good air movement throughout  Heart:  regular rate and rhythm, no murmurs; normal s1/s2  Neurologic:  alert and oriented. speech normal. station and gait normal. no gross deficitis. smooth, agile transfers.  Skin:  patch of red papular, coalescent lesions at lower back near the waistline. A few satellite papules.    Impression & Recommendations:  Problem # 1:  HEADACHE (ICD-784.0)  likely migranous. Visual problems resoved with cessation of lamictal but headaches remain. Will continue tramadol and liberalize use to every 6 hours. Consider prophylaxis but given other issues will not embark upon this at this point.   His updated medication list for this problem includes:    Bystolic 5 Mg Tabs (Nebivolol hcl) .Marland Kitchen... 1/2 daily    Tramadol Hcl 50 Mg Tabs (Tramadol hcl) ..... One tab by mouth every 6 hours as needed for headache  Orders: [facility]- Est  Level 4 (16109)  Problem # 2:  DIABETES MELLITUS II, UNCOMPLICATED (ICD-250.00) well controlled. A1c at 5.6 today. Continue current therapy. Atypical but effective for the patient.  His updated medication list for this problem includes:    Lisinopril 10 Mg Tabs (Lisinopril) .Marland Kitchen... 1/2 tab daily    Humalog 100 Unit/ml Soln (Insulin lispro (human)) .Marland KitchenMarland KitchenMarland KitchenMarland Kitchen 15-20 u prior to breakfast, 15-20 u prior to lunch and 15-20 u prior to evening meal.  please disp with syringes and needles qs 1 mo    Glucagon Emergency 1 Mg Kit (Glucagon (rdna)) .Marland Kitchen... For use with sugars below 40    Humalog Pen 100 Unit/ml Soln (Insulin lispro (human)) ..... Use as directed; disp qs for 60-70 units per day for one month    Januvia 100 Mg Tabs (Sitagliptin phosphate) ..... Once daily -  same time each day.  Orders: A1C-[facility] (60454) [facility]- Est  Level 4 (09811)  Problem # 3:  DERMATITIS (ICD-692.9) Assessment: New  possibly fungal. Apply home triamcinolone cream and monitor. May require antifungal therapy but given of meds with potiential liver toxicity will defer for now.  His updated medication list for this problem includes:    Triamcinolone Acetonide 0.1 % Oint (Triamcinolone acetonide) .Marland Kitchen... Aaa three times a day #one large tube  Orders: [facility]- Est  Level 4 (91478)  Complete Medication List: 1)  Ambien 10 Mg Tabs (Zolpidem tartrate) .... Take 1 tablet by mouth at bedtime 2)  Lisinopril 10 Mg Tabs (Lisinopril) .... 1/2 tab daily 3)  Prilosec 20 Mg Cpdr (Omeprazole) .... Take 1 capsule  by mouth once a day 4)  Cialis 20 Mg Tabs (Tadalafil) .... 1/4 tablet by mouth as needed 5)  Bystolic 5 Mg Tabs (Nebivolol hcl) .... 1/2 daily 6)  Multivitamins Tabs (Multiple vitamin) .... Take 1 tablet daily. 7)  Triamcinolone Acetonide 0.1 % Oint (Triamcinolone acetonide) .... Aaa three times a day #one large tube 8)  Humalog 100 Unit/ml Soln (Insulin lispro (human)) .Marland KitchenMarland KitchenMarland Kitchen 15-20 u prior to breakfast, 15-20 u prior to lunch and 15-20 u prior to evening meal.  please disp with syringes and needles qs 1 mo 9)  Flomax 0.4 Mg Cp24 (Tamsulosin hcl) .Marland Kitchen.. 1 by mouth daily 10)  Simvastatin 20 Mg Tabs (Simvastatin) .... One by mouth daily 11)  Prodigy Blood Glucose Monitor W/device Kit (Blood glucose monitoring suppl) .... Dispense one meter kit 12)  Prodigy Eject Blood Glucose Strp (Glucose blood) .... Use as directed 13)  Prodigy Twist Top Lancets 28g Misc (Lancets) .... As directed 14)  Lithium Carbonate 300 Mg Caps (Lithium carbonate) .... Written by dr. Kathrynn Running in [facility]. 15)  Bd Insulin Syringe Microfine 28g X 1/2" 1 Ml Misc (Insulin syringe-needle u-100) .... Use three times a day as directed.  disp qs x1 month. 16)  Glucagon Emergency 1 Mg Kit (Glucagon (rdna)) .... For use  with sugars below 40 17)  Humalog Pen 100 Unit/ml Soln (Insulin lispro (human)) .... Use as directed; disp qs for 60-70 units per day for one month 18)  Blood Glucose Monitor Kit (Blood glucose monitoring suppl) .... For checking sugars as directed 19)  Januvia 100 Mg Tabs (Sitagliptin phosphate) .... Once daily - same time each day. 20)  Gabapentin 300 Mg Caps (Gabapentin) .... Take 900mg  three times a day 21)  Tramadol Hcl 50 Mg Tabs (Tramadol hcl) .... One tab by mouth every 6 hours as needed for headache 22)  Depakote Er 250 Mg Tb24 (Divalproex sodium (migraine)) .... Take two daily with food.  per dr. Kathrynn Running in [facility]. 23)  Patanol 0.1 % Soln (Olopatadine hcl) .Marland Kitchen.. 1 drop each eye two times a day until cleared.  disp 1 bottle.  Patient Instructions: 1)  your depakote should be at the pharmacy 2)  I've sent in the tramadol 3)  this may be migraine, but for now let's stick with the tramadol 4)  for the rash, apply triamcinolone 2-3 times a day, call if it's not improvement 5)  follow-up to meet Dr. Lelon Perla in 1-2 months. Prescriptions: TRAMADOL HCL 50 MG TABS (TRAMADOL HCL) one tab by mouth every 6 hours as needed for headache  #60 x 1   Entered and Authorized by:   Myrtie Soman  MD   Signed by:   Myrtie Soman  MD on 02/12/2010   Method used:   [facility] to        [id number]       [address]       [place], [place]  [address]       Ph: [phone] or [phone]       Fax: [phone]   RxID:   [id number]   Laboratory Results   Blood Tests   Date/Time Received: February 12, 2010 11:19 AM  Date/Time Reported: February 12, 2010 11:41 AM   HGBA1C: 5.6%   (Normal Range: Non-Diabetic - 3-6%   Control Diabetic - 6-8%)  Comments: ...............test performed by......Marland KitchenBonnie A. [place], MLS (ASCP)cm

## 2010-09-21 NOTE — Progress Notes (Signed)
Summary: Labwork and headaches  Phone Note Call from Patient   Caller: Patient Call For: [facility] Summary of Call: Patrick Elliott left a VM to confirm the change in labwork.  He also said that the headaches had returned.  I discussed with Dr. Kathrynn Running who recommended he continue the Lamictal (at the restarted dose of 50 mg) for a few more days.  If they persist, he should d/c the Lamictal to see if that is the problem.  I left a VM for Patrick Elliott with this information and asked him to call with questions or concerns. Initial call taken by: [name] PsyD,  January 26, 2010 12:23 PM

## 2010-09-21 NOTE — Miscellaneous (Signed)
Summary: prior auth  Clinical Lists Changes new med-Januvia ordered in pharmacy clinic. prior auth to Dr. Raymondo Band. is pcp is to do, plz place in his chart box.Marland KitchenMarland KitchenGolden Circle RN  October 20, 2009 2:20 PM PA completed.... Myrtie Soman  MD  October 21, 2009 1:22 PM   Appended Document: prior auth pa sent back. they need more info. to pcp chart box

## 2010-09-21 NOTE — Progress Notes (Signed)
Summary: Lab work  Advice worker from Patient   Caller: Patient Call For: [name], Psy.D. Summary of Call: Patrick Elliott left a VM to see if he could get his labs drawn on a different day as he would like to head out of towno n the 10th.  I left a VM back saying that should be fine.  I will forward to the lab to let them know not to expect him on June 10th but shortly before or after this date.  Of note - he mentioned on VM that the change in medicine (decrease in Lamictal) seems to have helped his symptoms of blurry vision and headache.   Initial call taken by: [name] PsyD,  January 25, 2010 11:11 AM

## 2010-09-21 NOTE — Assessment & Plan Note (Signed)
Summary: Diabetes - Rx Clinic   Vital Signs:  Patient profile:   50 year old male Height:      69 inches Weight:      153 pounds BMI:     22.68 Pulse rate:   58 / minute BP sitting:   125 / 72  (right arm)  Primary Care Provider:  Myrtie Soman  MD   History of Present Illness: Patient arrives today with minimal complaints.  Was able to provide [place] with a written CBG log.  Blood glucose well controlled.  CBGs ranged from 150's to 190's on average.  Patient reports compliance with medications.  Infrequent hypoglycemia managed with only food.  Denies use of glucagon.   Patient reports some neuropathic pain despite being on Gabapentin.  Describes pain as a feeling of "being stung by a bee" 4-8 times per day.    Diabetic Foot Exam    10-g (5.07) Semmes-Weinstein Monofilament Test Performed by: Christian Mate D          Right Foot          Left Foot Visual Inspection     normal           normal Test Control      normal         normal Site 1         normal         normal Site 2         normal         normal Site 3         normal          Site 4         abnormal         abnormal Site 5         normal         normal Site 6         abnormal         abnormal Site 7         normal         normal Site 8         normal         normal Site 9         normal         normal Site 10         normal         normal  Impression      normal         normal  Allergies: 1)  Sulfamethoxazole (Sulfamethoxazole)  Diabetes Management Exam:    Foot Exam (with socks and/or shoes not present):       Sensory-Monofilament:          Left foot: normal          Right foot: normal   Impression & Recommendations:  Problem # 1:  DIABETES MELLITUS II, UNCOMPLICATED (ICD-250.00) Assessment Improved  Diabetes currently under good control.  CBGs range from 150-190s.  Patient takes humalog three times a day (noon, dinner time, evenings).  He is also on Januvia 100mg  daily and feels this is helping  with glucose control.  Denies use of glucagon for severe low CBG.   Recommended continuing same regimen for DM.  F/u with [facility] at the request of his primary MD.  Time with patient:  15 minutes.  Seen with:  Bethanne Ginger, PharmD, Marcheta Grammes, PharmD Candidate  His  updated medication list for this problem includes:    Lisinopril 10 Mg Tabs (Lisinopril) .Marland Kitchen... 1/2 tab daily    Humalog 100 Unit/ml Soln (Insulin lispro (human)) .Marland KitchenMarland KitchenMarland KitchenMarland Kitchen 15-20 u prior to breakfast, 15-20 u prior to lunch and 15-20 u prior to evening meal.  please disp with syringes and needles qs 1 mo    Glucagon Emergency 1 Mg Kit (Glucagon (rdna)) .Marland Kitchen... For use with sugars below 40    Humalog Pen 100 Unit/ml Soln (Insulin lispro (human)) ..... Use as directed; disp qs for 60-70 units per day for one month    Januvia 100 Mg Tabs (Sitagliptin phosphate) ..... Once daily - same time each day.  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  Is currently taking gabapentin 900 mg three times a day.  Reports no drowsiness with current dose.  Would recommend increasing dose up to 3600 mg/day.  If this is ineffective may consider a trial of Lyrica.  Will defer to Dr. Rexene Alberts. His updated medication list for this problem includes:    Lisinopril 10 Mg Tabs (Lisinopril) .Marland Kitchen... 1/2 tab daily    Humalog 100 Unit/ml Soln (Insulin lispro (human)) .Marland KitchenMarland KitchenMarland KitchenMarland Kitchen 15-20 u prior to breakfast, 15-20 u prior to lunch and 15-20 u prior to evening meal.  please disp with syringes and needles qs 1 mo    Glucagon Emergency 1 Mg Kit (Glucagon (rdna)) .Marland Kitchen... For use with sugars below 40    Humalog Pen 100 Unit/ml Soln (Insulin lispro (human)) ..... Use as directed; disp qs for 60-70 units per day for one month    Januvia 100 Mg Tabs (Sitagliptin phosphate) ..... Once daily - same time each day.  [facility]  Complete Medication List: 1)  Ambien 10 Mg Tabs (Zolpidem tartrate) .... Take 1 tablet by mouth at bedtime 2)  Lisinopril 10 Mg Tabs (Lisinopril) .... 1/2 tab daily 3)  Prilosec 20 Mg Cpdr (Omeprazole) .... Take 1 capsule by mouth once a day 4)  Cialis 20 Mg Tabs (Tadalafil) .... 1/4 tablet by mouth as needed 5)  Bystolic 5 Mg Tabs (Nebivolol hcl) .... 1/2 daily 6)  Multivitamins Tabs (Multiple vitamin) .... Take 1 tablet daily. 7)  Triamcinolone Acetonide 0.1 % Oint (Triamcinolone acetonide) .... Aaa three times a day #one large tube 8)  Humalog 100 Unit/ml Soln (Insulin lispro (human)) .Marland KitchenMarland KitchenMarland Kitchen 15-20 u prior to breakfast, 15-20 u prior to lunch and 15-20 u prior to evening meal.  please disp with syringes and needles qs 1 mo 9)  Flomax 0.4 Mg Cp24 (Tamsulosin hcl) .Marland Kitchen.. 1 by mouth daily 10)  Simvastatin 20 Mg Tabs (Simvastatin) .... One by mouth daily 11)  Prodigy Blood Glucose Monitor W/device Kit (Blood glucose monitoring suppl) .... Dispense one meter kit 12)  Prodigy Eject Blood Glucose Strp (Glucose blood) .... Use as directed 13)  Prodigy Twist Top Lancets 28g Misc (Lancets) .... As directed 14)  Lamictal 200 Mg Tabs (Lamotrigine) .... Per dr. Kathrynn Running [facility].  take one tablet daily. 15)  Lithium Carbonate 300 Mg Caps (Lithium carbonate) .... Written by dr. Kathrynn Running in mood disorder clinic. 16)  Bd Insulin Syringe Microfine 28g X 1/2" 1 Ml Misc (Insulin syringe-needle u-100) .... Use three times a day as directed.  disp qs x1 month. 17)  Glucagon Emergency 1 Mg Kit (Glucagon (rdna)) .... For use with  sugars below 40 18)  Humalog Pen 100 Unit/ml Soln (Insulin lispro (human)) .... Use as directed; disp qs for 60-70 units per day for one month 19)  Blood Glucose Monitor Kit (Blood glucose monitoring suppl) .... For checking sugars as directed 20)  Januvia 100 Mg Tabs (Sitagliptin phosphate) .... Once daily - same time each day. 21)  Gabapentin 300 Mg Caps  (Gabapentin) .... Take 900mg  three times a day 22)  Tramadol Hcl 50 Mg Tabs (Tramadol hcl) .... One tab by mouth every 6 hours as needed for headache  Prevention & Chronic Care Immunizations   Influenza vaccine: Fluvax 3+  (05/29/2009)   Influenza vaccine due: 10/02/2009    Tetanus booster: 05/22/2006: Done.   Tetanus booster due: 05/22/2016    Pneumococcal vaccine: Not documented  Colorectal Screening   Hemoccult: Not documented    Colonoscopy: Not documented  Other Screening   PSA: 0.24  (01/15/2008)   PSA due due: 01/14/2009   Smoking status: never  (05/29/2009)  Diabetes Mellitus   HgbA1C: 6.0  (09/23/2009)   Hemoglobin A1C due: 12/11/2008    Eye exam: normal  (03/21/2008)   Eye exam due: 03/21/2009    Foot exam: yes  (12/08/2009)   Foot exam action/deferral: Do today   High risk foot: Not documented   Foot care education: completed  (10/02/2008)   Foot exam due: 05/23/2009    Urine microalbumin/creatinine ratio: Not documented   Urine microalbumin/cr due: 05/23/2009    Diabetes flowsheet reviewed?: Yes   Progress toward A1C goal: At goal  Lipids   Total Cholesterol: Not documented   LDL: Not documented   LDL Direct: 79  (05/29/2009)   HDL: Not documented   Triglycerides: Not documented    SGOT (AST): 12  (11/10/2009)   SGPT (ALT): 16  (11/10/2009)   Alkaline phosphatase: 49  (11/10/2009)   Total bilirubin: 0.9  (11/10/2009)    Lipid flowsheet reviewed?: Yes   Progress toward LDL goal: Unchanged  Hypertension   Last Blood Pressure: 125 / 72  (12/08/2009)   Serum creatinine: 0.98  (12/02/2009)   Serum potassium 4.5  (12/02/2009)    Hypertension flowsheet reviewed?: Yes   Progress toward BP goal: At goal  Self-Management Support :   Personal Goals (by the next clinic visit) :     Personal A1C goal: 7  (05/29/2009)     Personal blood pressure goal: 130/80  (05/29/2009)     Personal LDL goal: 70  (05/29/2009)    Diabetes self-management  support: Written self-care plan  (05/29/2009)    Diabetes self-management support not done because: Good outcomes  (09/23/2009)   Last diabetes self-management training by diabetes educator: completed    Hypertension self-management support: Written self-care plan  (05/29/2009)    Hypertension self-management support not done because: Good outcomes  (12/08/2009)    Lipid self-management support: Written self-care plan  (05/29/2009)     Lipid self-management support not done because: Good outcomes  (11/11/2009)   Nursing Instructions: Diabetic foot exam today

## 2010-09-21 NOTE — Progress Notes (Signed)
Summary: Rx Req  Phone Note Refill Request Call back at [phone] Message from:  Patient  Refills Requested: Medication #1:  PRODIGY EJECT BLOOD GLUCOSE  STRP use as directed Pt is now checking his surgar 7 xs aday and needs enough strips to accomadate this.  Pharmacy [facility].  Initial call taken by: Clydell Hakim,  October 08, 2009 2:16 PM    Prescriptions: PRODIGY EJECT BLOOD GLUCOSE  STRP (GLUCOSE BLOOD) use as directed  #300 x 11   Entered and Authorized by:   Myrtie Soman  MD   Signed by:   Myrtie Soman  MD on 10/08/2009   Method used:   Electronically to        [id number] (retail)       [address]       [place], [place]  [address]       Ph: [phone] or [phone]       Fax: [phone]   RxID:   [id number]

## 2010-09-23 NOTE — Progress Notes (Signed)
----   Converted from flag ---- ---- 08/06/2010 2:21 PM, Enid Baas MD wrote: please call patient and let him know that we did find some arthritis in foot and will cont Tx as planned. ------------------------------  Called pt- left VM for him to return my call.   Appended Document:  Pt returned call - gave him above message from Dr. Darrick Penna.  He states insoles are already helping significantly.
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
## 2010-09-23 NOTE — Assessment & Plan Note (Signed)
Summary: [facility] Disorder Clinic    Primary Care Provider:  Angelena Sole MD   History of Present Illness: Patrick Elliott presents with [name] today.  His list includes:  1) Fatigue from doing very little - unlike what he has had in the past.  2) The finger snapping continues - maybe even more in the past.  Does not get in the way of sleep.  3) Racing thoughts are present but "not as bad" as the beginning.  4) Mood swings and depression that he could tie to the death of a close aunt.  It has been a bit of a roller coaster with regards to this death (according to [name]) and Patrick Elliott has been the "rock" for his mother.  5) Idle moments that feel awkward; absence of racing thoughts.  Not comfortable in his own skin.  Lasts around 5 minutes he guesses.    6) Headachess.  Went to the [facility].  Changed his 2700 mg gabapentin to 1800 mg gralise (time released gabapentin).  The doctor there proposed injections to calm the nerves down that might be causing the headaches - [name] and Patrick Elliott are wondering if this would affect his mood.   He reports the amount of work he is doing right now and the quality of it is significantly better with the Vyvanse.  The headaches are debilitating and that is the bigger thing that tends to derail him from work right now.    Allergies: 1)  Sulfamethoxazole (Sulfamethoxazole)   Impression & Recommendations:  Problem # 1:  BIPOLAR AFFECTIVE DISORDER (ICD-296.80) Report of mood is variable over the last month with a significant psychosocial stress noted.  Affect today seems a bit flatter than typical but not entirely.  Function outside of headaches seems reasonable.  Dr. Kathrynn Running recommended leaving the medicine alone.  The benefit of the Vyvanse is worth any downside to the medicine according to [facility].  Hopefully with recommended [facility] by [facility], Patrick Elliott will get some relief.  Dr. Kathrynn Running did not think the proposed injections would serve to destabilize mood  in any significant way and the absence of headaches has one of the best chances of improving mood.  Orders: Therapy 20-30 min- [facility] (16109)  Complete Medication List: 1)  Ambien 10 Mg Tabs (Zolpidem tartrate) .... Take 1 tablet by mouth at bedtime 2)  Lisinopril 10 Mg Tabs (Lisinopril) .... 1/2 tab daily 3)  Cialis 20 Mg Tabs (Tadalafil) .... 1/4 tablet by mouth as needed 4)  Bystolic 5 Mg Tabs (Nebivolol hcl) .... 1/2 daily 5)  Multivitamins Tabs (Multiple vitamin) .... Take 1 tablet daily. 6)  Triamcinolone Acetonide 0.1 % Oint (Triamcinolone acetonide) .... Aaa three times a day #one large tube 7)  Humalog 100 Unit/ml Soln (Insulin lispro (human)) .Marland KitchenMarland KitchenMarland Kitchen 15-20 u prior to breakfast, 15-20 u prior to lunch and 15-20 u prior to evening meal.  please disp with syringes and needles qs 1 mo 8)  Flomax 0.4 Mg Cp24 (Tamsulosin hcl) .Marland Kitchen.. 1 by mouth daily 9)  Simvastatin 20 Mg Tabs (Simvastatin) .... One by mouth daily 10)  Prodigy Blood Glucose Monitor W/device Kit (Blood glucose monitoring suppl) .... Dispense one meter kit 11)  Prodigy Eject Blood Glucose Strp (Glucose blood) .... Use as directed 12)  Prodigy Twist Top Lancets 28g Misc (Lancets) .... As directed 13)  Lithium Carbonate 300 Mg Caps (Lithium carbonate) .... Written by dr. Kathrynn Running in mood disorder clinic. 14)  Bd Insulin Syringe Microfine 28g X 1/2" 1 Ml Misc (Insulin  syringe-needle u-100) .... Use three times a day as directed.  disp qs x1 month. 15)  Glucagon Emergency 1 Mg Kit (Glucagon (rdna)) .... For use with sugars below 40 16)  Humalog Pen 100 Unit/ml Soln (Insulin lispro (human)) .... Use as directed; disp qs for 60-70 units per day for one month 17)  Blood Glucose Monitor Kit (Blood glucose monitoring suppl) .... For checking sugars as directed 18)  Januvia 100 Mg Tabs (Sitagliptin phosphate) .... Once daily - same time each day. 19)  Patanol 0.1 % Soln (Olopatadine hcl) .Marland Kitchen.. 1 drop each eye two times a day until cleared.   disp 1 bottle. 20)  Vyvanse 70 Mg Caps (Lisdexamfetamine dimesylate) .... Take one pill in the morning.  per dr. Kathrynn Running in mood disorder clinic. 21)  Prilosec 20 Mg Cpdr (Omeprazole) .Marland Kitchen.. 1 tab by mouth daily 22)  Lamictal 100 Mg Tabs (Lamotrigine) 23)  Gralise 600 Mg Tabs (Gabapentin (phn)) .Marland KitchenMarland KitchenMarland Kitchen 1800 mg [facility]  Patient Instructions: 1)  Please follow-up in three months - you can call to schedule this follow-up in one month. 2)  Prescriptions for Vyvanse were provided. 3)  Sorry to hear about the loss of your aunt. 4)  Have a wonderful holiday. 5)  Happy painting. Prescriptions: VYVANSE 70 MG CAPS (LISDEXAMFETAMINE DIMESYLATE) Take one pill in the morning.  Per Dr[facility] Kath[facility]nn [facility].  #30 x 0   Entered and Authorized by:   [name] PsyD   Signed by:   [name] PsyD on 08/04/2010   Method used:   Handwritten   RxID:   [id number] LAMICTAL 100 MG TABS (LAMOTRIGINE)   #30 x 0   Entered and Authorized by:   [name] PsyD   Signed by:   [name] PsyD on 08/04/2010   Method used:   Historical   RxID:   [id number] GRALISE 600 MG TABS (GABAPENTIN (PHN)) 1800 mg [facility]  #90 x 0   Entered and Authorized by:   [name] PsyD   Signed by:   [name] PsyD on 08/04/2010   Method used:   Historical   RxID:   [id number]    Orders Added: 1)  Therapy 20-30 min- [facility] [[id number]]

## 2010-09-23 NOTE — Assessment & Plan Note (Signed)
Summary: Check ear/kf   Vital Signs:  Patient profile:   50 year old male Height:      69 inches Weight:      170.19 pounds BMI:     25.22 BSA:     1.93 Temp:     97.6 degrees F Pulse rate:   67 / minute BP sitting:   129 / 77  Vitals Entered By: Jone Baseman CMA (September 09, 2010 11:25 AM) CC: check left ear Is Patient Diabetic? Yes Did you bring your meter with you today? No Pain Assessment Patient in pain? no        Primary Care Provider:  Angelena Sole MD  CC:  check left ear.  History of Present Illness: 1. Left ear injury:  Pt was cleaning his ears last Thursday evening when he had one of his twitches that caused his hand to jump and he jabbed himself with the end of the q-tip.  He began to bleed shortly after that.  It was red, warm, and swollen for a couple days after that.  It has slowly been getting better.  He denies problems hearing but endorses that it "feels like there is water in there".  It has not bled for a couple of days.  The redness and swelling is improved.  This has made his headaches worse.  ROS: denies fevers, decreased hearing, balance problems, n/v  Habits & Providers  Alcohol-Tobacco-Diet     Tobacco Status: never     Year Quit: 2007     Pack years: 0     Passive Smoke Exposure: no  Current Medications (verified): 1)  Ambien 10 Mg Tabs (Zolpidem Tartrate) .... Take 1 Tablet By Mouth At Bedtime 2)  Lisinopril 10 Mg  Tabs (Lisinopril) .... 1/2 Tab Daily 3)  Cialis 20 Mg Tabs (Tadalafil) .... 1/4 Tablet By Mouth As Needed 4)  Bystolic 5 Mg  Tabs (Nebivolol Hcl) .... 1/2 Daily 5)  Multivitamins   Tabs (Multiple Vitamin) .... Take 1 Tablet Daily. 6)  Triamcinolone Acetonide 0.1 %  Oint (Triamcinolone Acetonide) .... Aaa Three Times A Day #one Large Tube 7)  Humalog 100 Unit/ml Soln (Insulin Lispro (Human)) .Marland KitchenMarland KitchenMarland Kitchen 15-20 U Prior To Breakfast, 15-20 U Prior To Lunch and 15-20 U Prior To Evening Meal.  Please Disp With Syringes and Needles Qs 1  Mo 8)  Flomax 0.4 Mg  Cp24 (Tamsulosin Hcl) .Marland Kitchen.. 1 By Mouth Daily 9)  Simvastatin 20 Mg Tabs (Simvastatin) .... One By Mouth Daily 10)  Lithium Carbonate 300 Mg Caps (Lithium Carbonate) .... Written By Dr[facility]. 11)  Bd Insulin Syringe Microfine 28g X 1/2" 1 Ml Misc (Insulin Syringe-Needle U-100) .... Use Three Times A Day As Directed.  Disp Qs X1 Month. 12)  Glucagon Emergency 1 Mg Kit (Glucagon (Rdna)) .... For Use With Sugars Below 40 13)  Humalog Pen 100 Unit/ml Soln (Insulin Lispro (Human)) .... Use As Directed; Disp Qs For 60-70 Units Per Day For One Month 14)  Blood Glucose Monitor  Kit (Blood Glucose Monitoring Suppl) .... For Checking Sugars As Directed 15)  Januvia 100 Mg Tabs (Sitagliptin Phosphate) .... Once Daily - Same Time Each Day. 16)  Patanol 0.1 % Soln (Olopatadine Hcl) .Marland Kitchen.. 1 Drop Each Eye Two Times A Day Until Cleared.  Disp 1 Bottle. 17)  Vyvanse 70 Mg Caps (Lisdexamfetamine Dimesylate) .... Take One Pill in The Morning.  Per Dr. Kath[facility]nn [facility] in [facility]. 18)  Prilosec 20 Mg Cpdr (Omeprazole) .Marland KitchenMarland KitchenMarland Kitchen  1 Tab By Mouth Daily 19)  Lamictal 100 Mg Tabs (Lamotrigine) 20)  Gralise 600 Mg Tabs (Gabapentin (Phn)) .Marland KitchenMarland KitchenMarland Kitchen 1800 Mg [facility] 21)  Ketoprofen Gel 20% .... Aaa 3-4 Times A Day 22)  Keflex 500 Mg Caps (Cephalexin) .Marland Kitchen.. 1 Tab By Mouth Twice A Day For 7 Days 23)  Accu-Chek Advantage Diabetes  Kit (Blood Glucose Monitoring Suppl) .... Use As Directed 24)  Accu-Chek Soft Touch Lancets  Misc (Lancets) .... Use As Directed 25)  Accu-Chek Advantage Test  Strp (Glucose Blood) .... Use As Directed  Allergies: 1)  Sulfamethoxazole (Sulfamethoxazole)  Past History:  Past Medical History: Reviewed history from 01/22/2009 and no changes required. 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 Has used every drug in the book except IV ones but quit 2004.  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  Quit tob 04/2006.  Sexually active, uses condoms. Parents very involved.   Physical Exam  General:  Vitals reviewed.  Afebrile. Alert, in NAD. Eyes:  EOMI, PERRL Ears:  L ear:  entire ear is slightly swollen and red (much improved according to patient).  The ear canal has a large scab on about 7oclock.  This is obscuring the TM but what was seen appears intact.   R ear:  difficult to visualize TM due to anatomy Nose:  no nasal discharge.   Lungs:  Normal respiratory effort, chest expands symmetrically. Lungs are clear to auscultation, no crackles or wheezes. Heart:  normal rate, regular rhythm, and no murmur.     Impression & Recommendations:  Problem # 1:  EAR PAIN, LEFT (ICD-388.70) Assessment New  It appears that the q-tip punctured the ear canal but it doesn't appear to have affected the TM.  Unable to fully visualize the TM so will avoid ear drops.  It doesn't appear acutely infected but he does have some redness and swelling.  Overall it is getting better.  Provided him with a prescription for Keflex if the redness, swelling, or pain gets worse.  Told him to give it a couple more days to see if it continues to improve.  Precautions for worsening of symptoms given to patient. His updated medication list for this problem includes:    Keflex 500 Mg Caps (Cephalexin) .Marland Kitchen... 1 tab by mouth twice a day for 7 days  Orders: [facility]- Est Level  3 ([address])  Complete Medication List: 1)  Ambien 10 Mg Tabs (Zolpidem tartrate) .... Take 1 tablet by mouth at bedtime 2)  Lisinopril 10 Mg Tabs (Lisinopril) .... 1/2 tab daily 3)  Cialis 20 Mg Tabs (Tadalafil) .... 1/4  tablet by mouth as needed 4)  Bystolic 5 Mg Tabs (Nebivolol hcl) .... 1/2 daily 5)  Multivitamins Tabs (Multiple vitamin) .... Take 1 tablet daily. 6)  Triamcinolone Acetonide 0.1 % Oint (Triamcinolone acetonide) .... Aaa three times a day #one large tube 7)  Humalog 100 Unit/ml Soln (Insulin lispro (human)) .Marland KitchenMarland KitchenMarland Kitchen 15-20 u prior to breakfast, 15-20 u prior to lunch and 15-20 u prior to evening meal.  please disp with syringes and needles qs 1 mo 8)  Flomax 0.4 Mg Cp24 (Tamsulosin hcl) .Marland Kitchen.. 1 by mouth daily 9)  Simvastatin 20 Mg Tabs (Simvastatin) .... One by mouth daily 10)  Lithium Carbonate 300 Mg Caps (Lithium carbonate) .... Written by dr. Kathrynn Running in [facility]. 11)  Bd Insulin Syringe Microfine 28g X 1/2" 1 Ml Misc (Insulin syringe-needle u-100) .... Use three times a day as directed.  disp qs x1 month. 12)  Glucagon Emergency 1 Mg Kit (Glucagon (rdna)) .... For use with sugars below 40 13)  Humalog Pen 100 Unit/ml Soln (Insulin lispro (human)) .... Use as directed; disp qs for 60-70 units per day for one month 14)  Blood Glucose Monitor Kit (Blood glucose monitoring suppl) .... For checking sugars as directed 15)  Januvia 100 Mg Tabs (Sitagliptin phosphate) .... Once daily - same time each day. 16)  Patanol 0.1 % Soln (Olopatadine hcl) .Marland Kitchen.. 1 drop each eye two times a day until cleared.  disp 1 bottle. 17)  Vyvanse 70 Mg Caps (Lisdexamfetamine dimesylate) .... Take one pill in the morning.  per dr. Kathrynn [facility] in [facility]. 18)  Prilosec 20 Mg Cpdr (Omeprazole) .Marland Kitchen.. 1 tab by mouth daily 19)  Lamictal 100 Mg Tabs (Lamotrigine) 20)  Gralise 600 Mg Tabs (Gabapentin (phn)) .Marland KitchenMarland KitchenMarland Kitchen 1800 mg [facility] 21)  Ketoprofen Gel 20%  .... Aaa 3-4 times a day 22)  Keflex 500 Mg Caps (Cephalexin) .Marland Kitchen.. 1 tab by mouth twice a day for 7 days 23)  Accu-chek Advantage Diabetes Kit (Blood glucose monitoring suppl) .... Use as directed 24)  Accu-chek Soft Touch Lancets Misc  (Lancets) .... Use as directed 25)  Accu-chek Advantage Test Strp (Glucose blood) .... Use as directed  Patient Instructions: 1)  It looks like you scraped the ear canal 2)  It was difficult to fully visualized the ear drum but it looks intact 3)  I am going to give you a prescription for an antibiotic.  If it continues to feel red and warm after 3-4 days then you can take it 4)  I have sent in a prescription for your new diabetes supplies to the pharmacy Prescriptions: ACCU-CHEK ADVANTAGE DIABETES  KIT (BLOOD GLUCOSE MONITORING SUPPL) Use as directed  #1 x 0   Entered and Authorized by:   Angelena Sole MD   Signed by:   Angelena Sole MD on 09/09/2010   Method used:   Electronically to        [id number] * (retail)       [address]       [place], [place]  [address]       Ph: [phone] or [phone]       Fax: [phone]   RxID:   [id number] ACCU-CHEK ADVANTAGE TEST  STRP (GLUCOSE BLOOD) Use as directed  #100 x 3   Entered and Authorized by:   Angelena Sole MD   Signed by:   Angelena Sole MD on 09/09/2010   Method used:   Electronically to        [id number] * (retail)       [address]       [place], [place]  [address]       Ph: [phone] or [phone]       Fax: [phone]   RxID:   [id number] ACCU-CHEK SOFT TOUCH LANCETS  MISC (LANCETS) Use as directed  #100 x 3   Entered and Authorized by:  Angelena Sole MD   Signed by:   Angelena Sole MD on 09/09/2010   Method used:   Electronically to        [id number] * (retail)       [address]       [place], [place]  [address]       Ph: [phone] or [phone]       Fax: [phone]   RxID:   [id number] KEFLEX 500 MG CAPS (CEPHALEXIN) 1 tab by mouth twice a day for 7 days  #14 x 0   Entered and Authorized by:   Angelena Sole MD   Signed by:   Angelena Sole MD on 09/09/2010   Method used:    Electronically to        [id number] (retail)       [address]       [place], [place]  [address]       Ph: [phone] or [phone]       Fax: [phone]   RxID:   [id number]    Orders Added: 1)  [facility]- Est Level  3 [[address]]

## 2010-09-23 NOTE — Assessment & Plan Note (Signed)
Summary: [facility]   Primary Care Provider:  Angelena Sole MD   History of Present Illness: Patrick Elliott's agenda includes:  Continued mood swings but reports he is "dwelling on"  them longer.  [name] thinks his productivity has been good.  Snapping fingers still so much so that it is starting to create a callous.  It is not better in the morning before he takes his medicine.  Denies other motor changes including respiratory tics.  Denies other compulsive or impulsive behaviors.    Racing thoughts.  No better.  No worse.  Energy is up and down.    Allergies: 1)  Sulfamethoxazole (Sulfamethoxazole)   Impression & Recommendations:  Problem # 1:  UNSPECIFIED EPISODIC MOOD DISORDER (ICD-296.90) Unable to reliably track mood.  Frequent affective fluctuations although affect during visit in an appropriate range.  Thoughts are clear and goal directed.  No evidence of suicidal or homicidal ideation.  Discussed his agenda items.  Motor tic is thought to be related to stimulant.  If the  tic is worth the benefit of the Vyvanse, he can continue with the Vyvanse.  Changing stimulant medication might have a beneficial effect without the side effect.  Dr. Kathrynn Running also offered the option of going back on the Lamictal since we have determined that it is not the cause of his headaches and it might have been providing some benefit.  [name] reports he is better since the first time we met.  He gets out of bed every day and is productive in some way.  Floated the idea of adjusted expectations.    [name] elected to add back the Lamictal and leave the stimulant alone for now.   See patient instructions for additional plan.  Orders: Therapy 20-30 min- [facility] ([address])  Complete Medication List: 1)  Ambien 10 Mg Tabs (Zolpidem tartrate) .... Take 1 tablet by mouth at bedtime 2)  Lisinopril 10 Mg Tabs (Lisinopril) .... 1/2 tab daily 3)  Cialis 20 Mg Tabs (Tadalafil) .... 1/4 tablet by mouth as  needed 4)  Bystolic 5 Mg Tabs (Nebivolol hcl) .... 1/2 daily 5)  Multivitamins Tabs (Multiple vitamin) .... Take 1 tablet daily. 6)  Triamcinolone Acetonide 0.1 % Oint (Triamcinolone acetonide) .... Aaa three times a day #one large tube 7)  Humalog 100 Unit/ml Soln (Insulin lispro (human)) .Marland KitchenMarland KitchenMarland Kitchen 15-20 u prior to breakfast, 15-20 u prior to lunch and 15-20 u prior to evening meal.  please disp with syringes and needles qs 1 mo 8)  Flomax 0.4 Mg Cp24 (Tamsulosin hcl) .Marland Kitchen.. 1 by mouth daily 9)  Simvastatin 20 Mg Tabs (Simvastatin) .... One by mouth daily 10)  Prodigy Blood Glucose Monitor W/device Kit (Blood glucose monitoring suppl) .... Dispense one meter kit 11)  Prodigy Eject Blood Glucose Strp (Glucose blood) .... Use as directed 12)  Prodigy Twist Top Lancets 28g Misc (Lancets) .... As directed 13)  Lithium Carbonate 300 Mg Caps (Lithium carbonate) .... Written by dr. Kathrynn Running in [facility]. 14)  Bd Insulin Syringe Microfine 28g X 1/2" 1 Ml Misc (Insulin syringe-needle u-100) .... Use three times a day as directed.  disp qs x1 month. 15)  Glucagon Emergency 1 Mg Kit (Glucagon (rdna)) .... For use with sugars below 40 16)  Humalog Pen 100 Unit/ml Soln (Insulin lispro (human)) .... Use as directed; disp qs for 60-70 units per day for one month 17)  Blood Glucose Monitor Kit (Blood glucose monitoring suppl) .... For checking sugars as directed 18)  Januvia 100 Mg  Tabs (Sitagliptin phosphate) .... Once daily - same time each day. 19)  Gabapentin 300 Mg Caps (Gabapentin) .... Take 900mg  three times a day 20)  Sumatriptan Succinate 50 Mg Tabs (Sumatriptan succinate) .... Take 1 tab daily as needed for headaches, may repeat x 1 if needed.  do not take more than twice per week. 21)  Patanol 0.1 % Soln (Olopatadine hcl) .Marland Kitchen.. 1 drop each eye two times a day until cleared.  disp 1 bottle. 22)  Vyvanse 70 Mg Caps (Lisdexamfetamine dimesylate) .... Take one pill in the morning.  per dr[facility] Kathrynn [facility]. 23)  Prilosec 20 Mg Cpdr (Omeprazole) .Marland Kitchen.. 1 tab by mouth daily 24)  Lamictal 25 Mg Tabs (Lamotrigine) .... Take two pills twice a day (see patient instructions for start-up)  Patient Instructions: 1)  Please schedule a follow-up for:  December 14th at 11:30. 2)  Dr. Kathrynn Running restarted your Lamictal.  Pay attention to any unexpected rash - although you have tolerated the medicine in the past so would be unlikely.  Call Dr. Kathrynn Running if you have any questions or concerns about this. 3)  Take one Lamictal for two weeks.  Then two for two weeks.  Then three for two weeks and then four a day.  Target dose is 100 mg. Prescriptions: LAMICTAL 25 MG TABS (LAMOTRIGINE) Take two pills twice a day (see patient instructions for start-up)  #125 x 1   Entered and Authorized by:   [name] PsyD   Signed by:   [name] PsyD on 06/16/2010   Method used:   Handwritten   RxID:   [id number] VYVANSE 70 MG CAPS (LISDEXAMFETAMINE DIMESYLATE) Take one pill in the morning.  Per Dr[facility]nn [facility] in [facility].  #30 x 0   Entered and Authorized by:   [name] PsyD   Signed by:   [name] PsyD on 06/16/2010   Method used:   Handwritten   RxID:   [id number]    Orders Added: 1)  Therapy 20-30 min- [facility] [[id number]]

## 2010-09-23 NOTE — Progress Notes (Signed)
Summary: TRiage call  Phone Note Call from Patient   Caller: Patient Call For: [phone] Summary of Call: Please call patient back about pain to lf ear.  Cleaned with a q tip Friday.  May have pushed to far.  Now have corcern because there is some slight swelling and irriation.  Need to k now if he need to be seen or just get something over to  help.   Follow-up for Phone Call        Pt calling back.  Waiting for nurse to call Follow-up by: Abundio Miu,  September 06, 2010 2:45 PM  Additional Follow-up for Phone Call Additional follow up Details #1::        Was cleaning outside of ear last Friday and his hand jerked forcing the q-tip into the canal.  Since then he has noticed blood coming out of the canal on a couple of occasions, slight pain and a feeling of irritation and fullness in that ear.  No acute pain when it happended nor hearing changes.  He is currently out of town and won't be back until late Wed. Gave him an appt with his PCP for Thursday at 11 am.   Additional Follow-up by: Dennison Nancy RN,  September 06, 2010 3:07 PM

## 2010-09-23 NOTE — Assessment & Plan Note (Signed)
Summary: F/U,[facility]   Vital Signs:  Patient profile:   50 year old male Pulse rate:   71 / minute BP sitting:   127 / 70  (left arm)  Vitals Entered By: Rochele Pages RN (September 09, 2010 3:43 PM) CC: f/u L foot pain- same- walking improved w/ insoles    Primary Provider:  Angelena Sole MD  CC:  f/u L foot pain- same- walking improved w/ insoles .  History of Present Illness: 1 mo f/u for L foot pain and foot rolling when walking.  Reports heel lift has improved pain significantly, now 5/10 pain score.  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 This is shoe-dependent.  Feels as if the Ketoprofen did not help much.  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 Take 1 tablet by mouth at bedtime 2)  Lisinopril 10 Mg Tabs (Lisinopril) .... 1/2 tab daily 3)  Cialis 20 Mg Tabs (Tadalafil) .... 1/4 tablet by mouth as needed 4)  Bystolic 5 Mg Tabs (Nebivolol hcl) .... 1/2 daily 5)  Multivitamins Tabs (Multiple vitamin) .... Take 1 tablet daily. 6)  Triamcinolone Acetonide 0.1 % Oint (Triamcinolone acetonide) .... Aaa three times a day #one large tube 7)  Humalog 100 Unit/ml Soln (Insulin lispro (human)) .Marland KitchenMarland KitchenMarland Kitchen 15-20 u prior to breakfast, 15-20 u prior to lunch and 15-20 u prior to evening meal.  please disp with syringes and needles qs 1 mo 8)  Flomax 0.4 Mg Cp24 (Tamsulosin hcl) .Marland Kitchen.. 1 by mouth daily 9)  Simvastatin 20 Mg Tabs (Simvastatin) .... One by mouth daily 10)  Lithium Carbonate 300 Mg Caps (Lithium carbonate) .... Written by dr. Kathrynn Running in [facility]. 11)  Bd Insulin Syringe Microfine 28g X 1/2" 1 Ml Misc (Insulin syringe-needle u-100) .... Use three times a day as directed.  disp qs x1 month. 12)  Glucagon Emergency 1 Mg Kit (Glucagon (rdna)) .... For use with sugars below 40 13)  Humalog Pen 100 Unit/ml Soln (Insulin lispro (human)) .... Use as directed; disp qs for 60-70 units per day for one month 14)  Blood Glucose Monitor Kit (Blood glucose monitoring suppl) .... For  checking sugars as directed 15)  Januvia 100 Mg Tabs (Sitagliptin phosphate) .... Once daily - same time each day. 16)  Patanol 0.1 % Soln (Olopatadine hcl) .Marland Kitchen.. 1 drop each eye two times a day until cleared.  disp 1 bottle. 17)  Vyvanse 70 Mg Caps (Lisdexamfetamine dimesylate) .... Take one pill in the morning.  per dr. Kathrynn Running in [facility]. 18)  Prilosec 20 Mg Cpdr (Omeprazole) .Marland Kitchen.. 1 tab by mouth daily 19)  Lamictal 100 Mg Tabs (Lamotrigine) 20)  Gralise 600 Mg Tabs (Gabapentin (phn)) .Marland KitchenMarland KitchenMarland Kitchen 1800 mg [facility] 21)  Ketoprofen Gel 20%  .... Aaa 3-4 times a day 22)  Keflex 500 Mg Caps (Cephalexin) .Marland Kitchen.. 1 tab by mouth twice a day for 7 days 23)  Accu-chek Advantage Diabetes Kit (Blood glucose monitoring suppl) .... Use as directed 24)  Accu-chek Soft Touch Lancets Misc (Lancets) .... Use as directed 25)  Accu-chek Advantage Test Strp (Glucose blood) .... Use as directed 26)  Amitriptyline Hcl 25 Mg Tabs (Amitriptyline hcl) .Marland Kitchen.. 1 by mouth qhs  Patient Instructions: 1)  1) Return to clinic for custom cushioned orthotic. 2)  2) Continue topical application of Ketoprofen to Left Foot 3-4 times daily. 3)  3) Start Amitriptyline 25mg  by mouth at night. 4)  4) Return to clinic for follow up in 1 month. Prescriptions: AMITRIPTYLINE HCL 25 MG TABS (AMITRIPTYLINE HCL) 1 by mouth qhs  #30 x 1   Entered and Authorized by:   Enid Baas MD   Signed by:   Enid Baas MD on 09/09/2010   Method used:   [facility]ron[facility] to        [id number]ail[facility]       [address]       [place], [place]  [address]       Ph: [phone] or [phone]       Fax: [phone]   RxID:   [id number]    Orders Added: 1)  Est. Patient Level IV [[address]]

## 2010-09-23 NOTE — Assessment & Plan Note (Signed)
Summary: F/U VISIT/[facility]   Vital Signs:  Patient profile:   50 year old male Height:      69 inches Weight:      166.4 pounds BMI:     24.66 Temp:     98.1 degrees F oral Pulse rate:   86 / minute BP sitting:   133 / 71  (left arm) Cuff size:   regular  Vitals Entered By: Garen Grams LPN (August 05, 2010 2:19 PM) CC: f/u Is Patient Diabetic? Yes Did you bring your meter with you today? No   Primary Care Provider:  Angelena Sole MD  CC:  f/u.  History of Present Illness: 1. DMII:  Overall his blood sugars have been fairly well controlled.  He is taking the Januvia and Humolog as prescribed.  He tends to eat quite a few carbs because his girlfriend is a vegetarian.  He does check his blood sugars regularly and they have been between 80-210.  ROS: endorses getting shaky when his blood sugars get too low.  Denies vision changes.  2. Headaches:  [facility].  He was started on Gralise instead of Gabapentin.  He thinks that this might have helped a little bit.  The next step that they recommended was to try steroid injections and then botox injections.  He is a little bit nervous about trying these but wants the headaches to go away.  ROS: denies any new symptoms.  No numbness/weakness.   3. Left foot pain:  He injured his left foot a long time ago.  He has a bone that didn't heal right on the outside of his left foot.  He has tried custom orthotics before but they didn't help. He gets pain at the bottom of his left foot where the old bony injury was.  He is scheduled to go to Sports Medicine this afternoon for evaluation and is hoping that they can help.  Habits & Providers  Alcohol-Tobacco-Diet     Tobacco Status: never     Year Quit: 2007     Pack years: 0     Passive Smoke Exposure: no  Current Medications (verified): 1)  Ambien 10 Mg Tabs (Zolpidem Tartrate) .... Take 1 Tablet By Mouth At Bedtime 2)  Lisinopril 10 Mg  Tabs  (Lisinopril) .... 1/2 Tab Daily 3)  Cialis 20 Mg Tabs (Tadalafil) .... 1/4 Tablet By Mouth As Needed 4)  Bystolic 5 Mg  Tabs (Nebivolol Hcl) .... 1/2 Daily 5)  Multivitamins   Tabs (Multiple Vitamin) .... Take 1 Tablet Daily. 6)  Triamcinolone Acetonide 0.1 %  Oint (Triamcinolone Acetonide) .... Aaa Three Times A Day #one Large Tube 7)  Humalog 100 Unit/ml Soln (Insulin Lispro (Human)) .Marland KitchenMarland KitchenMarland Kitchen 15-20 U Prior To Breakfast, 15-20 U Prior To Lunch and 15-20 U Prior To Evening Meal.  Please Disp With Syringes and Needles Qs 1 Mo 8)  Flomax 0.4 Mg  Cp24 (Tamsulosin Hcl) .Marland Kitchen.. 1 By Mouth Daily 9)  Simvastatin 20 Mg Tabs (Simvastatin) .... One By Mouth Daily 10)  Prodigy Blood Glucose Monitor W/device Kit (Blood Glucose Monitoring Suppl) .... Dispense One Meter Kit 11)  Prodigy Eject Blood Glucose  Strp (Glucose Blood) .... Use As Directed 12)  Prodigy Twist Top Lancets 28g  Misc (Lancets) .... As Directed 13)  Lithium Carbonate 300 Mg Caps (Lithium Carbonate) .... Written By Dr[facility]. 14)  Bd Insulin Syringe Microfine 28g X 1/2" 1 Ml Misc (Insulin  Syringe-Needle U-100) .... Use Three Times A Day As Directed.  Disp Qs X1 Month. 15)  Glucagon Emergency 1 Mg Kit (Glucagon (Rdna)) .... For Use With Sugars Below 40 16)  Humalog Pen 100 Unit/ml Soln (Insulin Lispro (Human)) .... Use As Directed; Disp Qs For 60-70 Units Per Day For One Month 17)  Blood Glucose Monitor  Kit (Blood Glucose Monitoring Suppl) .... For Checking Sugars As Directed 18)  Januvia 100 Mg Tabs (Sitagliptin Phosphate) .... Once Daily - Same Time Each Day. 19)  Patanol 0.1 % Soln (Olopatadine Hcl) .Marland Kitchen.. 1 Drop Each Eye Two Times A Day Until Cleared.  Disp 1 Bottle. 20)  Vyvanse 70 Mg Caps (Lisdexamfetamine Dimesylate) .... Take One Pill in The Morning.  Per Dr[facility]. 21)  Prilosec 20 Mg Cpdr (Omeprazole) .Marland Kitchen.. 1 Tab By Mouth Daily 22)  Lamictal 100 Mg Tabs (Lamotrigine) 23)  Gralise 600 Mg  Tabs (Gabapentin (Phn)) .Marland KitchenMarland KitchenMarland Kitchen 1800 Mg [facility]  Allergies: 1)  Sulfamethoxazole (Sulfamethoxazole)  Past History:  Past Medical History: Reviewed history from 01/22/2009 and no changes required. acl repair 88 ICU with ARDSs UGIB 04/2006 Rods in both legs s/p removal DM II insulin dependant Tachycardai Insomnia Erectile dysfunction hepatic cirrhosis with ascites anemia h/o polysubstance abuse - some residual memory loss significant anxiety/depression with many sensitivities to medications mood d/o NOS - followed by mood d/o clinic  Physical Exam  General:  vitals reviewed.  no acute distress Head:  normocephalic and atraumatic.   Eyes:  vision grossly intact.  PERRL.  EOMI Ears:  R ear normal and L ear normal.   Neck:  supple, full ROM, and no masses.   Lungs:  Normal respiratory effort, chest expands symmetrically. Lungs are clear to auscultation, no crackles or wheezes. Heart:  normal rate, regular rhythm, and no murmur.   Msk:  Left foot:  Bony callus at the left 5th metatarsal bone.  Significant skin callus formation underneath that old injury site.  Pain on the plantar surface. Extremities:  no LE edema Neurologic:  alert & oriented X3, cranial nerves II-XII intact, strength normal in all extremities, and gait normal.  Sensation is decreased to light touch from the knees down bilaterally. Psych:  Oriented X3, normally interactive, not anxious appearing, and not depressed appearing.     Impression & Recommendations:  Problem # 1:  DIABETES MELLITUS II, UNCOMPLICATED (ICD-250.00) Assessment Unchanged A1C at goal.  No changes today. His updated medication list for this problem includes:    Lisinopril 10 Mg Tabs (Lisinopril) .Marland Kitchen... 1/2 tab daily    Humalog 100 Unit/ml Soln (Insulin lispro (human)) .Marland KitchenMarland KitchenMarland KitchenMarland Kitchen 15-20 u prior to breakfast, 15-20 u prior to lunch and 15-20 u prior to evening meal.  please disp with syringes and needles qs 1 mo    Glucagon  Emergency 1 Mg Kit (Glucagon (rdna)) .Marland Kitchen... For use with sugars below 40    Humalog Pen 100 Unit/ml Soln (Insulin lispro (human)) ..... Use as directed; disp qs for 60-70 units per day for one month    Januvia 100 Mg Tabs (Sitagliptin phosphate) ..... Once daily - same time each day.  Orders: A1C-[facility] (16109) [facility]- Est  Level 4 (60454)  Problem # 2:  HEADACHE (ICD-784.0) Assessment: Improved  Continue to have him follow [facility].  Continue Gralis.  Recommended for him to try the injections. His updated medication list for this problem includes:    Bystolic 5 Mg Tabs (Nebivolol hcl) .Marland KitchenMarland KitchenMarland KitchenMarland Kitchen  1/2 daily  Orders: [facility]- Est  Level 4 (16109)  Problem # 3:  FOOT PAIN, LEFT (ICD-729.5) Assessment: Unchanged  Follow up at [facility] Medicine.  I still think that patient would benefit from custom orthotics.  Orders: [facility]- Est  Level 4 (99214)  Complete Medication List: 1)  Ambien 10 Mg Tabs (Zolpidem tartrate) .... Take 1 tablet by mouth at bedtime 2)  Lisinopril 10 Mg Tabs (Lisinopril) .... 1/2 tab daily 3)  Cialis 20 Mg Tabs (Tadalafil) .... 1/4 tablet by mouth as needed 4)  Bystolic 5 Mg Tabs (Nebivolol hcl) .... 1/2 daily 5)  Multivitamins Tabs (Multiple vitamin) .... Take 1 tablet daily. 6)  Triamcinolone Acetonide 0.1 % Oint (Triamcinolone acetonide) .... Aaa three times a day #one large tube 7)  Humalog 100 Unit/ml Soln (Insulin lispro (human)) .Marland KitchenMarland KitchenMarland Kitchen 15-20 u prior to breakfast, 15-20 u prior to lunch and 15-20 u prior to evening meal.  please disp with syringes and needles qs 1 mo 8)  Flomax 0.4 Mg Cp24 (Tamsulosin hcl) .Marland Kitchen.. 1 by mouth daily 9)  Simvastatin 20 Mg Tabs (Simvastatin) .... One by mouth daily 10)  Prodigy Blood Glucose Monitor W/device Kit (Blood glucose monitoring suppl) .... Dispense one meter kit 11)  Prodigy Eject Blood Glucose Strp (Glucose blood) .... Use as directed 12)  Prodigy Twist Top Lancets 28g Misc (Lancets) .... As directed 13)  Lithium  Carbonate 300 Mg Caps (Lithium carbonate) .... Written [facility] dr[facility]nn [facility]. 14)  Bd Insulin Syringe Microfine 28g X 1/2" 1 Ml Misc (Insulin syringe-needle u-100) .... Use three times a day as directed.  disp qs x1 month. 15)  Glucagon Emergency 1 Mg Kit (Glucagon (rdna)) .... For use with sugars below 40 16)  Humalog Pen 100 Unit/ml Soln (Insulin lispro (human)) .... Use as directed; disp qs for 60-70 units per day for one month 17)  Blood Glucose Monitor Kit (Blood glucose monitoring suppl) .... For checking sugars as directed 18)  Januvia 100 Mg Tabs (Sitagliptin phosphate) .... Once daily - same time each day. 19)  Patanol 0.1 % Soln (Olopatadine hcl) .Marland Kitchen.. 1 drop each eye two times a day until cleared.  disp 1 bottle. 20)  Vyvanse 70 Mg Caps (Lisdexamfetamine dimesylate) .... Take one pill in the morning.  per dr[facility]nn [facility]. 21)  Prilosec 20 Mg Cpdr (Omeprazole) .Marland Kitchen.. 1 tab by mouth daily 22)  Lamictal 100 Mg Tabs (Lamotrigine) 23)  Gralise 600 Mg Tabs (Gabapentin (phn)) .Marland KitchenMarland KitchenMarland Kitchen 1800 mg [facility]   Orders Added: 1)  A1C-[facility] [83036] 2)  [facility]- Est  Level 4 [60454]    Laboratory Results   Blood Tests   Date/Time Received: August 05, 2010 2:14 PM  Date/Time Reported: August 05, 2010 2:29 PM   HGBA1C: 6.2%   (Normal Range: Non-Diabetic - 3-6%   Control Diabetic - 6-8%)  Comments: ...............test performed by......Marland KitchenBonnie A. [place], MLS (ASCP)cm

## 2010-09-23 NOTE — Consult Note (Signed)
[facility]   Imported By: De Nurse 08/11/2010 11:58:37  _____________________________________________________________________  External Attachment:    Type:   Image     Comment:   External Document

## 2010-09-28 ENCOUNTER — Encounter: Payer: Self-pay | Admitting: Family Medicine

## 2010-09-28 ENCOUNTER — Encounter (INDEPENDENT_AMBULATORY_CARE_PROVIDER_SITE_OTHER): Payer: BC Managed Care – PPO | Admitting: Family Medicine

## 2010-09-28 DIAGNOSIS — M79609 Pain in unspecified limb: Secondary | ICD-10-CM

## 2010-09-28 DIAGNOSIS — R269 Unspecified abnormalities of gait and mobility: Secondary | ICD-10-CM

## 2010-09-28 DIAGNOSIS — M2170 Unequal limb length (acquired), unspecified site: Secondary | ICD-10-CM

## 2010-10-06 ENCOUNTER — Other Ambulatory Visit: Payer: Self-pay | Admitting: Family Medicine

## 2010-10-06 NOTE — Telephone Encounter (Signed)
Please review and refill

## 2010-10-07 NOTE — Assessment & Plan Note (Signed)
Summary: ORTHOTICS PER FIELDS/[facility]   Vital Signs:  Patient profile:   50 year old male Pulse rate:   84 / minute BP sitting:   148 / 78  (left arm)  Vitals Entered By: Rochele Pages RN (September 28, 2010 2:54 PM) CC: orthotics and rt knee pain intermittent with going upstairs   Primary Provider:  Angelena Sole MD  CC:  orthotics and rt knee pain intermittent with going upstairs.  History of Present Illness: 50yo male to office for custom orthotics.  Has hx of L foot pain, leg length difference, and gait abnormalitiy.  Pain improved with heel lift in left shoe to correct leg length difference.  Also noticed improvement in L foot pain with amitriptyline.  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  Having some R-sided knee pain for 1-2 months.  Denies injury or trauma.  No swelling.  Worse with going up & down stairs.  Denies any mechanical symptoms.    Preventive Screening-Counseling & Management  Alcohol-Tobacco     Smoking Status: quit  Allergies: 1)  Sulfamethoxazole (Sulfamethoxazole)  Past History:  Past Medical History: Last updated: 01/22/2009 acl repair 88 ICU with ARDSs UGIB 04/2006 Rods in both legs s/p removal DM II insulin dependant Tachycardai Insomnia Erectile dysfunction hepatic cirrhosis with ascites anemia h/o polysubstance abuse - some residual memory loss significant anxiety/depression with many sensitivities to medications mood d/o NOS - followed by mood d/o clinic  Past Surgical History: Last updated: 02/20/2009 barrium swallow study 10/3-wnl - 05/25/2006,  EGD 9/20: grade 1 varicies, mild gastropathy - 05/25/2006,  intubation 9/7-9/18/2007 - 05/25/2006,  Paracentesis x2 04/2006 - 05/25/2006 Normal stress nuclear test 02/18/09 ([facility])  Social History: Smoking Status:  quit  Physical Exam  General:  Well-developed,well-nourished,in no acute distress; alert,appropriate and cooperative throughout examination Msk:  KNEES: full ROM  b/l without pain.  No swelling or effusion.  R knee with mild TTP along lateral & medial patella, neg patellar apprehension, (+)patellar grind.  No ligamentous laxity & neg McMurray b/l.  FEET: moderate longitudinal arch.  Prominence over R 5th TMT with mild tenderness, no erythema or warmth.    GAIT: L leg 1.5 cm shorter than right.  Significant trendelenburg gait to left with walking barefoot.    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 The orthotic was heated and afterward the patient stood on the orthotic blank positioned on the orthotic stand. The patient was positioned in subtalar neutral position and 10 degrees of ankle dorsiflexion in a weight bearing stance. After completion of molding, a stable base was applied to the orthotic blank. The blank was ground to a stable position for weight bearing. 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  Still with  slight degree of trendelenburg with lift, but greatly improved & more neutral gait - Cont. to wear heel lift in shoes which orthotics will not fit  Orders: Orthotic Materials, each unit (L3002)  Problem # 3:  ABNORMALITY OF GAIT (ICD-781.2)  - Significant  trendelenburg gait secondary to leg length difference - Fitted with custom orthotic as stated above with improvement in gait  Orders: Orthotic Materials, each unit (L3002)  Complete Medication List: 1)  Ambien 10 Mg Tabs (Zolpidem tartrate) .... Take 1 tablet by mouth at bedtime 2)  Lisinopril 10 Mg Tabs (Lisinopril) .... 1/2 tab daily 3)  Cialis 20 Mg Tabs (Tadalafil) .... 1/4 tablet by mouth as needed 4)  Bystolic 5 Mg Tabs (Nebivolol hcl) .... 1/2 daily 5)  Multivitamins Tabs (Multiple vitamin) .... Take 1 tablet daily. 6)  Triamcinolone Acetonide 0.1 % Oint (Triamcinolone acetonide) .... Aaa three times a day #one large tube 7)  Humalog 100 Unit/ml Soln (Insulin lispro (human)) .Marland KitchenMarland KitchenMarland Kitchen 15-20 u prior to breakfast, 15-20 u prior to lunch and 15-20 u prior to evening meal.  please disp with syringes and needles qs 1 mo 8)  Flomax 0.4 Mg Cp24 (Tamsulosin hcl) .Marland Kitchen.. 1 by mouth daily 9)  Simvastatin 20 Mg Tabs (Simvastatin) .... One by mouth daily 10)  Lithium Carbonate 300 Mg Caps (Lithium carbonate) .... Written by dr[facility] Kathrynn Running [facility]. 11)  Bd Insulin Syringe Microfine 28g X 1/2" 1 Ml Misc (Insulin syringe-needle u-100) .... Use three times a day as directed.  disp qs x1 month. 12)  Glucagon Emergency 1 Mg Kit (Glucagon (rdna)) .... For use with sugars below 40 13)  Humalog Pen 100 Unit/ml Soln (Insulin lispro (human)) .... Use as directed; disp qs for 60-70 units per day for one month 14)  Blood Glucose Monitor Kit (Blood glucose monitoring suppl) .... For checking sugars as directed 15)  Januvia 100 Mg Tabs (Sitagliptin phosphate) .... Once daily - same time each day. 16)  Patanol 0.1 % Soln (Olopatadine hcl) .Marland Kitchen.. 1 drop each eye two times a day until cleared.  disp 1 bottle. 17)  Vyvanse 70 Mg Caps (Lisdexamfetamine dimesylate) .... Take one pill in the morning.  per dr[facility]nn [facility]. 18)  Prilosec 20 Mg Cpdr (Omeprazole) .Marland Kitchen.. 1 tab by mouth daily 19)   Lamictal 100 Mg Tabs (Lamotrigine) 20)  Gralise 600 Mg Tabs (Gabapentin (phn)) .Marland KitchenMarland KitchenMarland Kitchen 1800 mg [facility] 21)  Ketoprofen Gel 20%  .... Aaa 3-4 times a day 22)  Keflex 500 Mg Caps (Cephalexin) .Marland Kitchen.. 1 tab by mouth twice a day for 7 days 23)  Accu-chek Advantage Diabetes Kit (Blood glucose monitoring suppl) .... Use as directed 24)  Accu-chek Soft Touch Lancets Misc (Lancets) .... Use as directed 25)  Accu-chek Advantage Test Strp (Glucose blood) .... Use as directed 26)  Amitriptyline Hcl 25 Mg Tabs (Amitriptyline hcl) .Marland Kitchen.. 1 by mouth qhs 27)  Sumatriptan Succinate 50 Mg Tabs (Sumatriptan succinate) .Marland Kitchen.. 1 tab by mouth daily as needed for migraine   Orders Added: 1)  Est. Patient Level IV [04540] 2)  Orthotic Materials, each unit [L3002]

## 2010-10-11 ENCOUNTER — Other Ambulatory Visit: Payer: Self-pay | Admitting: Family Medicine

## 2010-10-11 NOTE — Telephone Encounter (Signed)
Please review and refill

## 2010-10-18 ENCOUNTER — Telehealth: Payer: Self-pay | Admitting: Family Medicine

## 2010-10-18 NOTE — Telephone Encounter (Signed)
Pt is requesting a letter for disability, letter needs to explain how his health affects his work & his limitations, said this needs to be addressed to the administrative law judge, call pt with questions.

## 2010-10-18 NOTE — Telephone Encounter (Signed)
Will forward to PCP 

## 2010-10-19 ENCOUNTER — Encounter: Payer: Self-pay | Admitting: Sports Medicine

## 2010-10-19 NOTE — Telephone Encounter (Signed)
I am not sure which medical issue he feels he is disabled from.  Whatever issue that he feels that he is disabled from he will need to have the paper work filled out by the specialist.

## 2010-10-25 NOTE — Telephone Encounter (Signed)
Called and LMOVM for patient to call back with additional details.  Advised that he could leave the info requested with admin team.    Admin team: Please just inquire about what it is he is trying to get disability with.  Please see MD message below

## 2010-10-25 NOTE — Telephone Encounter (Signed)
Pt returned call, was explaining what he felt was limiting his ability to work & advised pt he should discuss this during an office visit, pt is scheduled on 3/21 & will discuss getting letter at that visit.

## 2010-10-27 ENCOUNTER — Encounter: Payer: Self-pay | Admitting: Psychology

## 2010-10-28 NOTE — Letter (Signed)
Summary: Generic Letter  [facility]  [address]   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  This limits his ability to stand for longer than 1 hour at a time.  He cannot walk further than 200 yards without significant foot pain.  This is complicated by Daibetes Mellitus and by a leg length inequality from an old injury.  While we have placed him in orthotics and on medications to help with pain, the arthritic changes are permanent and may worsen over time.           Sincerely,     Enid Baas MD

## 2010-11-01 ENCOUNTER — Telehealth: Payer: Self-pay | Admitting: *Deleted

## 2010-11-01 NOTE — Telephone Encounter (Signed)
PA required for Sumatriptan and Januvia. Januvia PA goes to medicaid and sumatriptan goes to [employer]. PA forms placed in MD box.

## 2010-11-02 NOTE — Telephone Encounter (Signed)
Received notice from [facility] that PA was not required for sumatriptan , quanity limit of 10 tabs per 30 days , notified pharmacy but they cannot get it to go thru. Tried to send thru medicaid and it was accepted .

## 2010-11-04 ENCOUNTER — Encounter: Payer: Self-pay | Admitting: Family Medicine

## 2010-11-04 NOTE — Telephone Encounter (Signed)
Pt returning Lynn's call

## 2010-11-04 NOTE — Telephone Encounter (Signed)
This encounter was created in error - please disregard.

## 2010-11-04 NOTE — Telephone Encounter (Signed)
Dr. Lelon Perla advises to D/C Januvia since it cannot be approved thru medicaid.  Pharmacy notified.  Call to patient and message left to call back Need to notify him.. Dr. Lelon Perla wants him to schedule appointment soon to discuss other meds to use.

## 2010-11-04 NOTE — Telephone Encounter (Signed)
Spoke with patient and he has appointment with Dr. Lelon Perla 11/10/2010. Advised him to bring log of BS to that appointmentand MD will decide which other med he can start.

## 2010-11-10 ENCOUNTER — Encounter: Payer: Self-pay | Admitting: Family Medicine

## 2010-11-10 ENCOUNTER — Ambulatory Visit (INDEPENDENT_AMBULATORY_CARE_PROVIDER_SITE_OTHER): Payer: BC Managed Care – PPO | Admitting: Family Medicine

## 2010-11-10 ENCOUNTER — Ambulatory Visit (INDEPENDENT_AMBULATORY_CARE_PROVIDER_SITE_OTHER): Payer: BC Managed Care – PPO | Admitting: Psychology

## 2010-11-10 VITALS — BP 128/78 | HR 70 | Temp 97.4°F | Wt 174.2 lb

## 2010-11-10 DIAGNOSIS — F319 Bipolar disorder, unspecified: Secondary | ICD-10-CM

## 2010-11-10 DIAGNOSIS — F988 Other specified behavioral and emotional disorders with onset usually occurring in childhood and adolescence: Secondary | ICD-10-CM

## 2010-11-10 DIAGNOSIS — R51 Headache: Secondary | ICD-10-CM

## 2010-11-10 DIAGNOSIS — G609 Hereditary and idiopathic neuropathy, unspecified: Secondary | ICD-10-CM

## 2010-11-10 DIAGNOSIS — E119 Type 2 diabetes mellitus without complications: Secondary | ICD-10-CM

## 2010-11-10 MED ORDER — SITAGLIPTIN PHOSPHATE 100 MG PO TABS: 100.0000 mg | ORAL_TABLET | Freq: Every day | ORAL | Status: DC

## 2010-11-10 NOTE — Assessment & Plan Note (Addendum)
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  His blood sugars are under pretty good control.  Agree with the use of Gralise or Neurontin to help with symptom control.  Unsure if he is really going to get any better.  Advised him of the normal progression of this disease and that it is likely to get worse.  Pt is a vegetarian.  Would probably be useful to check B12 levels.

## 2010-11-10 NOTE — Assessment & Plan Note (Signed)
HgA1C is unchanged today.  Still under pretty good control.  Pt did not qualify for Januvia based on Insurance criteria.  Provided a letter that he could use if he wants to dispute it.  The main thing that he thinks Januvia helps with is helping to stabilize his blood sugar and keep it from going to low.  Unsure if this is a real medical benefit or not.  Will continue Januvia for now.

## 2010-11-10 NOTE — Patient Instructions (Signed)
I have provided a letter for your disability hearing I have provided a letter for you to use to dispute the denial of the prior authorization of the Januvia Your peripheral neuropathy is likely to continue to get worse.  The most important thing that we can do is make sure your blood sugar is under control Please schedule a follow up appointment in 3 months

## 2010-11-10 NOTE — Patient Instructions (Addendum)
Please schedule a follow up for:  May 2nd at 11:00. Sorry to hear that you are having a hard time.   You agreed to stay the course with the medication you are currently taking to see if the steroid injections might be pushing you in a negative way.

## 2010-11-10 NOTE — Assessment & Plan Note (Signed)
Continue treatment per Dr. Annia Belt at the [facility].

## 2010-11-10 NOTE — Progress Notes (Signed)
  Subjective:    Patient ID: Patrick Elliott, male    DOB: November 25, 1960, 50 y.o.   MRN: [id number]  HPI 1. DMII:  Pt is taking his Insulin as prescribed.  He is taking the Januvia but is about to run out.  He is concerned because insurance denied the PA request.  He thinks that it is the medicine that is helping the most with his DMII.  The main benefit is that it keeps his blood sugars more stable.  He doesn't have the highs and lows that he used to have before starting the medication.  He hasn't been on other oral medications in the past that he can remember.  He is afraid of trying anything different because he doesn't want to drop too low again.  2. Peripheral Neuropathy:  He was switched from Neurontin to Gralise by Dr. Sharene Skeans and [facility].  His peripheral neuropathy is getting worse according to him.  He is taking the medications as prescribed.  It is now really starting to affect his hands  3. Headaches:  Being seen by Dr. Annia Belt [facility].  He was put on a course of steroids and is now getting the botox injections.  The botox seems to be helping a little bit.  He is trying to limit caffeine intake and is still following a vegetarian diet.  4. Left mid-foot arthritis:  Being followed by Dr. Darrick Penna at [facility]   Review of Systems Denies chest pain, shortness of breath, vision changes.  Denies cyanosis.  Endorses his toes feeling cold at times.  Denies new numbness / weakness    Objective:   Physical Exam  Constitutional: He is oriented to person, place, and time. He appears well-developed and well-nourished. No distress.  HENT:  Head: Normocephalic and atraumatic.  Right Ear: External ear normal.  Left Ear: External ear normal.       No sinus pain / pressure  Eyes: Conjunctivae and EOM are normal. Pupils are equal, round, and reactive to light.  Neck: Normal range of motion. Neck supple. No thyromegaly present.  Cardiovascular: Normal  rate and regular rhythm.   Pulmonary/Chest: Effort normal and breath sounds normal. No respiratory distress.  Abdominal: Soft.  Musculoskeletal: Normal range of motion. He exhibits no edema.  Neurological: He is alert and oriented to person, place, and time.       Decreased sensation in both feet and hands.  Normal strength.  Skin: Skin is warm and dry. No erythema.  Psychiatric: He has a normal mood and affect.          Assessment & Plan:

## 2010-11-10 NOTE — Assessment & Plan Note (Signed)
Mood is stable.  Advised him to continue with therapy with Dr. Pascal Lux.  Will defer recommendations for medication changes to [facility].

## 2010-11-10 NOTE — Progress Notes (Signed)
Patrick Elliott has a list that includes: Continued headaches; post steroid injections and botox treatment. Trouble with depression and manic episodes Thought process is confused; has trouble getting out what he wants to say Tic in his hand Days with irritability and fatigue.  Does report good periods - "really elated" lasting 5 minutes to an hour followed by a crash.   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   Reports static in head.    He covered headaches and neuropathic pain with Dr. Lelon Perla.  Patrick Elliott's point is that taken in isolation - these things do not seem that significant but that all together, he is having difficulty managing on a daily basis.    Had to come off of the Januvia secondary to insurance issues.  It has helped curb his anxiety and checking of his sugars so they are upset about this.  Patrick Elliott reports that the Vyvanse is still worth using even with the tic.  Discussed trying a different medicine to see if he could still have effectiveness without the tic.

## 2010-11-10 NOTE — Assessment & Plan Note (Signed)
Patrick Elliott is unable to report on his mood as he has such reported affective instability.  His thoughts are clear and goal directed today.  He does appears somewhat flat.  As previously stated, Dr. Kathrynn Running thinks we have exhausted the possibility with regards to mood stabilizers.  There is a chance that a different medication to treat [name]'s AD/HD might capture some of the symptoms Patrick Elliott is reporting today but perhaps not.  Also, there is the possibility that the steroid injections could have been destabilizing.  See AD/HD assessment for further information.

## 2010-11-10 NOTE — Assessment & Plan Note (Signed)
Patrick Elliott would like to try to get better control of his thoughts.  Dr. Kathrynn Running thinks it is reasonable to try a different medication.  Dr. Kathrynn Running did think it possible that the steroid injections he had to treat headaches might still be having an impact on his mood / thoughts.  Agreed to wait for another couple of months to see if things improve.

## 2010-11-14 LAB — GLUCOSE, CAPILLARY: Glucose-Capillary: 134 mg/dL — ABNORMAL HIGH (ref 70–99)

## 2010-11-21 ENCOUNTER — Other Ambulatory Visit: Payer: Self-pay | Admitting: Family Medicine

## 2010-11-21 NOTE — Telephone Encounter (Signed)
Refill request

## 2010-11-22 ENCOUNTER — Telehealth: Payer: Self-pay | Admitting: *Deleted

## 2010-11-22 NOTE — Telephone Encounter (Addendum)
PA required for Humalog Pen  For medicaid. Form placed  in Dr. Minna Antis  Box. Patient states he has enough novolog to last until MD returns .

## 2010-11-23 ENCOUNTER — Other Ambulatory Visit: Payer: Self-pay | Admitting: Sports Medicine

## 2010-11-23 DIAGNOSIS — M79609 Pain in unspecified limb: Secondary | ICD-10-CM

## 2010-11-23 NOTE — Telephone Encounter (Signed)
Refill request

## 2010-12-06 NOTE — Telephone Encounter (Signed)
wants to know if PA has been done for his insulin - pls call to let him know

## 2010-12-07 ENCOUNTER — Other Ambulatory Visit: Payer: Self-pay | Admitting: Family Medicine

## 2010-12-07 MED ORDER — INSULIN LISPRO 100 UNIT/ML ~~LOC~~ SOLN: 2.0000 [IU] | Freq: Three times a day (TID) | SUBCUTANEOUS | Status: DC

## 2010-12-08 ENCOUNTER — Telehealth: Payer: Self-pay | Admitting: Family Medicine

## 2010-12-08 NOTE — Telephone Encounter (Signed)
rec'd medical records request to [name] - DOS- 08/22/08- present Records were mailed 12/09/10 Sent to: [facility] [address] [place], [place] [address] phn [phone]  De Nurse

## 2010-12-09 ENCOUNTER — Other Ambulatory Visit: Payer: Self-pay | Admitting: Psychology

## 2010-12-09 ENCOUNTER — Telehealth: Payer: Self-pay | Admitting: *Deleted

## 2010-12-09 DIAGNOSIS — F319 Bipolar disorder, unspecified: Secondary | ICD-10-CM

## 2010-12-09 MED ORDER — ZOLPIDEM TARTRATE 10 MG PO TABS: 10.0000 mg | ORAL_TABLET | Freq: Every evening | ORAL | Status: DC | PRN

## 2010-12-09 MED ORDER — ZOLPIDEM TARTRATE 5 MG PO TABS: 10.0000 mg | ORAL_TABLET | Freq: Every evening | ORAL | Status: DC | PRN

## 2010-12-09 NOTE — Assessment & Plan Note (Signed)
Patrick Elliott called with a problem with his Ambien refill.  Discussed with Dr. Kathrynn Running who authorized 10 mg of Ambien #30 no refills to be called into the pharmacy.  Spoke to Autumn at [facility] and called Patrick Elliott back to let him know.

## 2010-12-09 NOTE — Telephone Encounter (Addendum)
Patient called asking if Humolog pen had ever been approved . advised that MD changed to   Humolog vial.  He calls back after he picked up from pharmacy and  states the directions on vial states 2-5 units before meal. States he  may have taken that amount when he was on the Januvia but now that doesn't touch his Blood Sugars .  Since stopping the Januvia he has been taking 12-20 units prior to meal. Will notify Dr. Lelon Perla. Dr. Deirdre Priest notified also.

## 2010-12-10 NOTE — Telephone Encounter (Signed)
Humalog pen was denied but Humalog vial was approved.  The Januvia should have also been approved so he should be taking that.  If he is having trouble controlling his blood sugars please have him schedule an appointment.

## 2010-12-13 ENCOUNTER — Ambulatory Visit: Payer: BC Managed Care – PPO | Admitting: Family Medicine

## 2010-12-13 NOTE — Telephone Encounter (Addendum)
calld pharmacy and was told that patient actually did pick up Januvia on 12/09/2010 for $35.00. Message left on voicemail for patient to advise if continuing to have problems controlling Blood Sugars to schedule appointment with MD.  spoke with patient and  is not sure that med was approved , he just decided to pay out of pocket but not sure he can keep doing that.  Advised to  come in to see MD if sugars continue elevated and out of control.

## 2010-12-22 ENCOUNTER — Ambulatory Visit (INDEPENDENT_AMBULATORY_CARE_PROVIDER_SITE_OTHER): Payer: BC Managed Care – PPO | Admitting: Psychology

## 2010-12-22 ENCOUNTER — Other Ambulatory Visit: Payer: Self-pay | Admitting: Family Medicine

## 2010-12-22 DIAGNOSIS — F988 Other specified behavioral and emotional disorders with onset usually occurring in childhood and adolescence: Secondary | ICD-10-CM

## 2010-12-22 DIAGNOSIS — F319 Bipolar disorder, unspecified: Secondary | ICD-10-CM

## 2010-12-22 NOTE — Assessment & Plan Note (Addendum)
Patrick Elliott's report of mood today is within normal limits.  Affect is consistent.  Thoughts are clear and goal directed.  Depressed mood seems to be under reasonable control.  Irritability, racing thoughts remain an issue [name] clearly identified a reasonable way of coping.  I recommended that therapy (if he is open to looking at this a different way) might help him adjust expectations and detach from moments when he gets overwhelmed.  Accepting this as part of how his brain operates (rather than thinking it should be different) might aid his coping.  I did therapy a brief time with Patrick Elliott in the past and thought that the outcome was not all that good.  Provided another resource today although I would be willing to see [name] again if we could agree on the goal.  See patient instructions for further details.  No change in meds today.  Therapy is the recommended treatment to capture residual symptoms.

## 2010-12-22 NOTE — Telephone Encounter (Signed)
Refill request

## 2010-12-22 NOTE — Progress Notes (Signed)
Patrick Elliott reports confusion, rapid thoughts, burning in his fingers, his continued twitch and rapid shifts in mood where he is "beside himself" with irritability.  Patrick Elliott stated that in terms of his depressed moods - they use to last weeks and now last moments to a day or so.  He does note this as a significant improvement.  The irritability is more troublesome to him.  He deals with it by walking away and when he does, he notes the irritability dissipates.  He does find himself returning to what it was that proved frustrating and getting irritable again.  Discussed this.  In terms of attention / concentration / confusion - Patrick Elliott does believe the Vyvanse helps.  Looked at expectations here and what his options are.

## 2010-12-22 NOTE — Assessment & Plan Note (Addendum)
Periods of inattention and confusion are likely going to be part of the picture.  The goal would be to minimize these in terms of frequency, intensity and duration so as to decrease the effect on function.  From a functional standpoint, Patrick Elliott has improved in this regard on Vyvanse (per his report).  It does come with a significant tic.  Discussed possible change to Focalin.  Patrick Elliott wanted to try.  Dr. Kathrynn Running wrote the prescription - see patient instructions for further plan.

## 2010-12-22 NOTE — Patient Instructions (Signed)
Please schedule a follow-up for: June 6th at 11:00. You decided to try another medication for your attention issues to see if it would work better and decrease the tic that you have had on the Vyvanse.  Dr. Kathrynn Running wrote a prescription for Focalin.  Take one pill for two days and then increase to two pills (20 mg).  Call [phone] with any questions. Please keep a record of your symptoms (how is my focus / attention; does it tend to wear off; is my tic any different?). I think psychotherapy would be a nice piece of your treatment if you are OPEN to changing the way you think about your world.  You could see a psychologist in the community.  Claris Gower might be a good match.  His phone number is:  [phone] and is located at [address] [place], [place] [address].

## 2011-01-03 ENCOUNTER — Telehealth: Payer: Self-pay | Admitting: *Deleted

## 2011-01-03 NOTE — Telephone Encounter (Signed)
PA required for sumatriptan. Patient received # 8 tabs on 12/22/2010, now medicaid is requiring PA for additional tabs. Form placed in MD box.
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
## 2011-01-07 NOTE — H&P (Signed)
NAME:  Patrick Elliott, Patrick Elliott NO.:  [id number]   MEDICAL RECORD NO.:  [id number]          PATIENT TYPE:  INP   LOCATION:  2912                         FACILITY:  [facility]   PHYSICIAN:  Barbaraann Share, MD,FCCPDATE OF BIRTH:  September 02, 1960   DATE OF ADMISSION:  04/28/2006  DATE OF DISCHARGE:                                HISTORY & PHYSICAL   CRITICAL CARE ADMISSION   HISTORY OF PRESENT ILLNESS:  The patient is a 50 year old white male with a  history of alcohol abuse and cirrhosis, was transferred from [facility], today, with progressive ARDS in acute respiratory  distress, as well as shock.  The patient was admitted there on 04/24/2006  with increasing abdominal girth and lower extremity swelling as well as GI  bleeding.  He was found to have a hemoglobin of 8.3 with coagulopathy and  thrombocytopenia.  He underwent an abdominal ultrasound on 09/04 with dense,  fatty liver infiltration consistent with cirrhosis, splenomegaly, one  gallstone, and a questionable decreased flow in the intrahepatic portal  vein.  The patient developed worsening respiratory distress on 09/06 with  hypoxemia; and was treated with BiPAP and 100% FIO2.  The patient was  planned to have an upper endoscopy to evaluate GI bleeding, but had to be  cancelled due to hypoxemia.  The patient began to develop worsening  respiratory distress, this afternoon, requiring endotracheal intubation and  ventilation prior to transfer to this facility.  His chest x-ray there  showed dense bilateral air space disease consistent with ARDS.  The patient  also required Levophed for shock/BP support.   PAST MEDICAL HISTORY:  1. Significant only for his alcoholism.  2. History of ACL repair.   ALLERGIES:  The patient is allergic to SULFA.   MEDICATIONS:  The patient's medications prior to admission to [facility] are unknown.   SOCIAL HISTORY:  Significant for alcohol in  the past.   FAMILY HISTORY AND REVIEW OF SYSTEMS:  Unable to be obtained.   PHYSICAL EXAM:  GENERAL:  He is a well-developed white male currently in no  acute distress on mechanical ventilation and pressors.  VITAL SIGNS:  Blood pressure is 105/70.  Pulse is 87.  Respiratory rate is  30.  Temperature is 99.2.  HEENT:  Pupils are unequal with his right pupil being slightly larger than  his left; both are responsive to light.  Extraocular muscles were not able  to be evaluated, at this time.  Oral endotracheal tube in place.  NECK:  Supple without lymphadenopathy or thyromegaly.  There is an external  jugular line on the left in place.  CHEST:  Reveals diffuse crackles and pops throughout.  CARDIAC EXAM:  Reveals a regular rate and rhythm.  No murmurs, rubs, or  gallops.  ABDOMEN:  Protuberant with definite fluid wave consistent with ascites.  It  is not tense.  Bowel sounds are positive.  His abdomen is not tender to the  touch.  GENITAL EXAM, RECTAL EXAM AND BREAST EXAM:  Not done, not indicated.  EXTREMITIES:  Lower extremities show trace to  1+ edema.  Pulses are intact  distally.  NEUROLOGIC:  The patient is sedated and not responsive, currently.  It is  difficult to get him to withdrawal to pain.   LABORATORY DATA:  His labs this morning at [name] showed a white blood  cell count of 9900 with a hemoglobin of 9.4, hematocrit of 28 and a platelet  count of 152,000.  He has a sodium of 125, potassium 3.9, chloride 93,  bicarb 26, glucose 159, creatinine 0.9, BUN 13, bilirubin was 2.3 with an  INR of 1.9.  Currently the patient is on mechanical ventilation with 100%  FIO2 and 20 of PEEP.   IMPRESSION:  1. Acute respiratory failure secondary to bilateral pulmonary infiltrates.      I suspect the patient has developed aspiration pneumonia and      subsequently adult respiratory distress syndrome.  He will need low      stretch adult respiratory distress syndrome protocol, and  recruitment      with fairly high levels of PEEP.  2. Shock of unclear etiology.  It is unclear whether he is septic or      whether he is developing hypovolemia from his diuresis while at      [facility].  He will need a central line placement and CVP monitoring.  3. 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  The patient      also has mild coagulopathy; and has had thrombocytopenia, but it is      improved.  4. Recent gastrointestinal bleeding secondary to #3.  The patient will      need close monitoring, and upper endoscopy at some point when he is      more stable.  5. Questionable pancreatitis.  The patient had increased lipase on      admission to [facility].  This will need to be rechecked and possibly      will need a CT scan of the abdomen and pelvis.   PLAN:  1. Will place on ARDS protocol as well as an A-line and central line  2. Volume resuscitation to a CVP of 10/12 and wean off pressors.  3. GI consultation once the patient is more stable, or sooner if there is      evidence for worsening GI bleeding.  4. Recheck amylase, lipase, and arterial ammonia.  5. Will do bronchoalveolar lavage with quantitative culture, and place on      vancomycin, Zosyn, and azithromycin.  Will also check Legionella      urinary antigen.           ______________________________  Barbaraann Share, MD,FCCP     [facility]/MEDQ  D:  04/28/2006  T:  04/28/2006  Job:  [id number]

## 2011-01-07 NOTE — Discharge Summary (Signed)
NAME:  Patrick Elliott, Patrick Elliott NO.:  [id number]   MEDICAL RECORD NO.:  [id number]          PATIENT TYPE:  INP   LOCATION:  3032                         FACILITY:  [facility]   PHYSICIAN:  Leighton Roach McDiarmid, M.D.DATE OF BIRTH:  04/05/1961   DATE OF ADMISSION:  04/28/2006  DATE OF DISCHARGE:  05/17/2006                                 DISCHARGE SUMMARY   Of note, the patient was in the ICU from September 7 to May 11, 2006.   ATTENDING:  Dr. McDiarmid   PRIMARY CARE PHYSICIAN:  Dr. Gavin Potters is now his primary care, was initially  admitted without one.   CONSULTS:  1. Physical therapy and occupational therapy.  2. Nutrition.  3. [facility].   PROCEDURES:  1. Extubated on September 18 (intubated at [facility] on September      7).  2. Left central line placement and removal on May 12, 2006.  3. September 21 swallow study that showed small aspiration with thin      liquids.  4. Paracentesis on September 18 that was a negative culture of the      paracentetic fluid and September 26 that returned 4 L with 99 cells and      a culture pending at discharge.  5. EGD on September 20 that showed grade 1 varices, mild gastropathy, no      bleeding, and nothing to be banded.   REASON FOR ADMISSION:  A 50 year old white male with long history of alcohol  abuse since 50 years old admitted originally to [facility] on  September 3 with a GI bleed but became hypotensive and hypoxic requiring  intubation and pressors on September 7 and transferred to RICU with  VDRF/ARDS secondary to presumed aspiration pneumonia.   DISCHARGE DIAGNOSES:  1. 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  2. Ascites, status post two therapeutic paracenteses.  3. Status post upper gastrointestinal bleed from a grade 1 esophageal      varices, confirmed by EGD.  4. Status post shock requiring intubation and pressors.  5. Ventilator-dependent respiratory failure/acute respiratory distress      syndrome extubated on September 18, secondary to aspiration pneumonia.  6. Iron deficiency anemia secondary to upper gastrointestinal bleed, on      iron (discharge hemoglobin 9.6).  7. Swallowing difficulty secondary to prolonged intubation.  8. Delirium.  9. Drug eruption rash/resolved, unknown causative agent.  10.Hyponatremia.  11.Hypoalbuminemia.   DISCHARGE MEDICATIONS:  1. Lasix 40 mg p.o. daily.  2. Spironolactone 100 mg p.o. daily.  3. Multivitamin one tablet p.o. daily.  4. Iron sulfate 325 mg p.o. t.i.d. with meals.  5. Lactulose 30-45 mL p.o. t.i.d. to titrate to three soft bowel movements      per day.  6. Prilosec OTC 20 mg p.o. daily.  7. Chantix starter pack take as directed.  8. Ensure supplement t.i.d.   DISPOSITION:  The patient was discharged to the care of his parents, who are  very attentive and have demonstrated the ability to care for  him 24 hours a  day.  Home health for OT/OT as well as for home O2 has been set up to arrive  at his parent's house, given his severe deconditioning and desaturating to  81% with walking.  He is able to walk with a walker and only requires O2  with activity and at night while sleeping.  His mental status at discharge  is about 80% of the time fully oriented and approximately 20% of the time  delirious, but always pleasantly confused and never combative.  He is  tolerating the soft mechanical diet with aspiration precaution techniques  well.   FOLLOWUP:  1. The patient is to follow up with Dr. Gavin Potters at [facility] on May 25, 2006, at 1:30 p.m.  2. The patient needs an outpatient swallow study to follow up on his      initial swallow study.  I was unable to get a hold of the acute rehab      facility at [phone] but will continue to try and call the patient with      results and addend this discharge summary.   FOLLOWUP  ISSUES:  1. Hyponatremia, potassium and elevated LFTs.  He was chronically      hyponatremic, ranging 128-131 which will need to be monitored to keep      above 125.  He was intermittently hypokalemic on spironolactone and      Lasix with normal magnesium.  LFTs were mostly stable the last few days      of admission.  2. Mental status.  His delirium was mostly at night and I hope this will      improve with lactulose and being back home.  Currently not able to be      alone secondary to wandering at night.  3. Ascites.  Had a tap on the day of discharge with 4 L of fluid off,      which was only 8 days after a previous similarly large tap.  Urine      sodium 2, potassium ratio was greater than 1, so there is room to go up      on his spironolactone if and when the fluid reaccumulates.  He may need      routine therapeutic taps, does not meet requirements currently for SBP      prophylaxis.  4. Deconditioning.  The patient has PT, OT, home health set up and was      improving greatly over the few days out of the ICU.  He will need to      follow up on his progress and determine if continued OT/PT is needed.  5. O2 saturations.  Was desaturating on room air with activity into the      80s.  He will be sent home on home O2 now.  He will need to follow up      with activity-induced O2 saturations in clinic.  6. Repeat swallow study.  Outpatient appointment to be scheduled with a      discharge addendum to follow.   DISCHARGE LABORATORIES:  On discharge, the patient's hemoglobin was 9.4,  white blood cell count 5.8, platelets were 96.  Sodium was 128, potassium  was 3.5, chloride 103, bicarb 19, BUN 4, creatinine 0.8, glucose 186,  calcium was 7.7 but corrected to normal because of an albumin of 1.9, his  total bilirubin was 1.6, alk phos was 97, AST 136, ALT 61,  magnesium 1.6,  and phosphorus 3.4.  Paracentetic fluid showed 99 cells; the culture was pending at discharge.  He was fecal  occult negative x3.  Blood and urine  cultures from September 24 were negative to date.  Urine sodium was 15,  urine potassium was 38.  INR was 1.5.  Iron was 39, TIBC 207, percent  saturation 19, ferritin 71.  Hepatitis panel was negative.  HIV and RPR were  both negative.   HOSPITAL COURSE BY PROBLEM:  This is a 50 year old white male with a 30-year  history of alcohol abuse admitted to [facility] with an upper GI  bleed on September 3 and subsequently became hypoxic and hypotensive  requiring intubation and pressors on September 7 with a transfer to our ICU  with the following issues:   # 1 - VENTILATOR-DEPENDENT RESPIRATORY FAILURE/ACUTE RESPIRATORY DISTRESS  SYNDROME SECONDARY TO PRESUMED ASPIRATION PNEUMONIA.  The patient was placed  on the ARDS protocol and covered with IV antibiotics (vancomycin and Zosyn  on September 7 to September 18, and azithromycin from September 7 to  September 9).  Legionella antigen was negative and a BAL culture was  negative.  Blood cultures were also negative.  The patient was extubated  successfully on September 18 but required 2 L nasal with activity and at  night for desaturations into the 80s.  The swallow study showed some  aspiration with thin liquids so the patient was placed on aspiration  precautions including chopped meat and thin liquids with a chin tuck.  A  repeat swallow study will need to be done as an outpatient for improvement.  I was unable to get in touch with June at acute rehab at [phone] but left a  message and will set up an outpatient swallow study for the patient with an  addendum to follow.  Chantix was provided to the patient at the parent's  request for continued smoking cessation.   #2 - UPPER GASTROINTESTINAL BLEED.  Initial bleed was on September 3.  Hemoglobin was 8.3 and he got 2 units packed red blood cells and 2 units FFP  on September 8 secondary to coagulopathy and thrombocytopenia.  He also  received  another 2 units once he was moved to the floor secondary to a  hemoglobin approximately 8.3.  No workup previously secondary to the  intubation.  GI was consulted on September 19 and did an EGD which showed a  grade 1 varices with mild gastropathy but no banding or injection needed.  He was continued on the PPI throughout his admission and at his discharge.  Given the grade of the varices and his decreased blood pressures, no beta  blockers were needed at this time, but depending on his liver function this  may become needed in the future.  His hemoglobin was stable once out of the  ICU and he was continued on iron as an outpatient for anemia.   #3 - CIRRHOSIS SECONDARY TO ALCOHOLISM.  The patient was detoxified  appropriately on thiamine, folic acid, multivitamin, and Ativan protocol  without evidence of DTs.  The patient admitted with a significant amount of  ascites which was drained approximately 4-6 L on September 18 and again on September 26.  September 18 cell count/culture were negative.  September 26  white blood cell count was 99, culture was pending at discharge.  INR was  elevated with decreased platelets.  He was treated with spironolactone and  Lasix to titrate to effect.  No spontaneous  bacterial peritonitis  prophylaxis needed at discharge.  The patient was counseled at discharge  about the future effects of alcohol and to avoid most all liver toxic drugs  and medications.  Will need to have LFTs and a BMET checked closely  secondary to cirrhosis and the ascites.  Will continue the lactulose that  was started on September 25.   #4 - DELIRIUM.  The patient had waxing and waning mental status once he was  extubated that is thought to be multifactorial:  ICU delirium versus chronic  hepatic encephalopathy versus decreased brain reserve secondary to possible  anoxic brain injury during the hypotensive episode.  He was improving  slightly on the lactulose.  Will likely improve  some once he returns to a  familiar environment but I worry this may be a long, slow resolution if at  all.  Because of this, the patient is unable to return home to live alone  and will be going to stay with his parents for 24-hour supervision for the  time being.   #5 - DECONDITIONING.  The patient was extremely deconditioned after a 3-week  ICU stay.  PT/OT worked well for him and will continue at home.  Nutrition  has recommended approximately 1700 Kcal per day with 80-90 g of protein,  especially using Ensure supplements t.i.d. - all of which he was tolerating  well by discharge.  His recovery will be slow, though, for sure.  At  discharge he was not stable on his feet enough to return home unsupervised.     ______________________________  Lupita Raider, M.D.    ______________________________  Leighton Roach McDiarmid, M.D.    KS/MEDQ  D:  05/17/2006  T:  05/18/2006  Job:  [id number]   cc:   Rolm Gala, M.D.

## 2011-01-07 NOTE — Discharge Summary (Signed)
NAME:  Patrick Elliott, Patrick Elliott NO.:  [id number]   MEDICAL RECORD NO.:  [id number]          PATIENT TYPE:  INP   LOCATION:  3032                         FACILITY:  [facility]   PHYSICIAN:  Lupita Raider, M.D.   DATE OF BIRTH:  Jul 15, 1961   DATE OF ADMISSION:  04/28/2006  DATE OF DISCHARGE:  05/17/2006                                 DISCHARGE SUMMARY   ADDENDUM:  Patient has been setup for a repeat swallow study on May 24, 2006 at 11:30 a.m.  All of the referral information has been sent over to  acute rehab and they will contact him at his parent's location, at the  number [phone], to inform them of this appointment.           ______________________________  Lupita Raider, M.D.     KS/MEDQ  D:  05/19/2006  T:  05/19/2006  Job:  [id number]

## 2011-01-07 NOTE — Telephone Encounter (Signed)
Called medicaid yesterday and was told medicaid has approved for patient to get # 40 tabs . Called pharmacy to notify them and was told [employer] has  approved #16 per month. Pharmacist will call medicaid   to clarify.

## 2011-01-10 ENCOUNTER — Telehealth: Payer: Self-pay | Admitting: Cardiovascular Disease

## 2011-01-10 MED ORDER — LISINOPRIL 10 MG PO TABS: 10.0000 mg | ORAL_TABLET | Freq: Every day | ORAL | Status: DC

## 2011-01-10 NOTE — Telephone Encounter (Signed)
lISINOPRIL REFILLED AND AN APPT. MADE FOR 01/25/2011

## 2011-01-10 NOTE — Telephone Encounter (Signed)
CALL PT REGARDING ONE OF HIS SCRIPTS. CHART IN BOX.

## 2011-01-18 ENCOUNTER — Other Ambulatory Visit: Payer: Self-pay | Admitting: Family Medicine

## 2011-01-18 NOTE — Telephone Encounter (Signed)
Refill request

## 2011-01-21 NOTE — Telephone Encounter (Signed)
Refill request

## 2011-01-24 ENCOUNTER — Other Ambulatory Visit: Payer: Self-pay | Admitting: Sports Medicine

## 2011-01-24 ENCOUNTER — Other Ambulatory Visit: Payer: Self-pay | Admitting: Family Medicine

## 2011-01-24 NOTE — Telephone Encounter (Signed)
Refill request

## 2011-01-25 ENCOUNTER — Ambulatory Visit: Payer: BC Managed Care – PPO | Admitting: Family Medicine

## 2011-01-25 ENCOUNTER — Ambulatory Visit (INDEPENDENT_AMBULATORY_CARE_PROVIDER_SITE_OTHER): Payer: BC Managed Care – PPO | Admitting: Cardiovascular Disease

## 2011-01-25 ENCOUNTER — Encounter: Payer: Self-pay | Admitting: Cardiovascular Disease

## 2011-01-25 VITALS — BP 150/94 | HR 72 | Ht 68.0 in | Wt 170.6 lb

## 2011-01-25 DIAGNOSIS — I10 Essential (primary) hypertension: Secondary | ICD-10-CM

## 2011-01-25 MED ORDER — LISINOPRIL 20 MG PO TABS: 20.0000 mg | ORAL_TABLET | Freq: Every day | ORAL | Status: DC

## 2011-01-25 NOTE — Progress Notes (Signed)
Patrick Elliott Date of Birth  1960/12/27 [facility] [address]     [address] [place], [place]  [address] [phone]  Fax  [phone]  History of Present Illness:  Patrick Elliott is a middle-aged gentleman with a history of hypertension and history of diastolic congestive heart failure. He also has a history of diabetes mellitus, liver cirrhosis, and bipolar disease.  He has done well from a cardiac standpoint. He's not had any episodes of chest pain or shortness of breath.  Current Outpatient Prescriptions on File Prior to Visit  Medication Sig Dispense Refill  . ACCU-CHEK AVIVA PLUS test strip USE AS DIRECTED  100 strip  3  . amitriptyline (ELAVIL) 25 MG tablet TAKE 1 TABLET BY MOUTH EVERY NIGHT AT BEDTIME  30 tablet  3  . Gabapentin, PHN, (GRALISE) 600 MG TABS Take 3,000 mg by mouth daily.        . Glucagon, rDNA, (GLUCAGON EMERGENCY IJ) Use for sugars below 40       . INS SYRINGE/NEEDLE 1CC/29G (B-D INS SYR ULTRAFINE 1CC/29G) 29G X 1/2" 1 ML MISC Use three times a day as directed       . insulin lispro (HUMALOG) 100 UNIT/ML injection Inject 2-5 Units into the skin 3 (three) times daily before meals.  10 mL  3  . lamoTRIgine (LAMICTAL) 100 MG tablet Per Dr. Kathrynn [facility] in [facility].       Marland Kitchen Lancets (ACCU-CHEK MULTICLIX) lancets USE AS DIRECTED  100 each  3  . lithium (ESKALITH) 450 MG CR tablet TAKE 1 TABLET BY MOUTH EVERY DAY WITH FOOD  30 tablet  3  . Multiple Vitamin (MULTIVITAMIN) capsule Take 1 capsule by mouth daily.        . nebivolol (BYSTOLIC) 5 MG tablet Take 5 mg by mouth daily.       Marland Kitchen omeprazole (PRILOSEC) 20 MG capsule Take 20 mg by mouth daily.        . simvastatin (ZOCOR) 40 MG tablet Take 40 mg by mouth daily.        . sitaGLIPtan (JANUVIA) 100 MG tablet Take 1 tablet (100 mg total) by mouth daily. Same time each day  30 tablet  6  . SUMAtriptan (IMITREX) 50 MG tablet 1 TAB DAILY AS NEEDED FOR HEADACHE, MAY REPEAT ONCE  IF NEEDED,DON'T TAKE MORE THAN TWICE A WEEK  10 tablet  3  . tadalafil (CIALIS) 20 MG tablet Take 5 mg by mouth as needed.        . zolpidem (AMBIEN) 10 MG tablet Take 1 tablet (10 mg total) by mouth at bedtime as needed.  30 tablet  0  . DISCONTD: lisinopril (PRINIVIL,ZESTRIL) 10 MG tablet Take 1 tablet (10 mg total) by mouth daily.  30 tablet  11  . DISCONTD: dexmethylphenidate (FOCALIN XR) 10 MG 24 hr capsule Take 20 mg by mouth daily. One in the morning for two days and then two in the morning.  Written by Dr. Kathrynn Running in [facility].  60 capsule  0  . DISCONTD: lithium 300 MG capsule Per Dr. Kathrynn Running in [facility].       Marland Kitchen DISCONTD: olopatadine (PATANOL) 0.1 % ophthalmic solution Place 1 drop into both eyes 2 (two) times daily.        Marland Kitchen DISCONTD: Tamsulosin HCl (FLOMAX) 0.4 MG CAPS Take 0.4 mg by mouth daily.        Marland Kitchen DISCONTD: triamcinolone (KENALOG) 0.1 % ointment Apply topically 3 (  three) times daily.          Allergies  Allergen Reactions  . Sulfamethoxazole     REACTION: unspecified    Past Medical History  Diagnosis Date  . Hypertension   . Diabetes mellitus   . Anemia   . Cirrhosis   . CHF (congestive heart failure)     DIASTOLIC  . Tachycardia   . Dizziness   . Syncope and collapse     No past surgical history on file.  History  Smoking status  . Former Smoker  . Quit date: 04/22/2006  Smokeless tobacco  . Never Used    History  Alcohol Use No    Family History  Problem Relation Age of Onset  . Hypertension Father     Reviw of Systems:  Reviewed in the HPI.  All other systems are negative.  Physical Exam: BP 150/94  Pulse 72  Ht 5\' 8"  (1.727 m)  Wt 170 lb 9.6 oz (77.384 kg)  BMI 25.94 kg/m2 The patient is alert and oriented x 3.  The mood and affect are normal.  The skin is warm and dry.  Color is normal.  The HEENT exam reveals that the sclera are nonicteric.  The mucous membranes are moist.  The carotids are 2+ without bruits.  There is no  thyromegaly.  There is no JVD.  The lungs are clear.  The chest wall is non tender.  The heart exam reveals a regular rate with a normal S1 and S2.  There are no murmurs, gallops, or rubs.  The PMI is not displaced.   Abdominal exam reveals good bowel sounds.  There is no guarding or rebound.  There is no hepatosplenomegaly or tenderness.  There are no masses.  Exam of the legs reveal no clubbing, cyanosis, or edema.  The legs are without rashes.  The distal pulses are intact.  Cranial nerves II - XII are intact.  Motor and sensory functions are intact.  The gait is normal.  Assessment / Plan:

## 2011-01-25 NOTE — Assessment & Plan Note (Signed)
His blood pressure remains mildly elevated. He is not eating any extra salt. I would like to increase his lisinopril to 20 mg a day. He will have  blood work checked at [facility].  I'll see him again in 6 months.

## 2011-01-26 ENCOUNTER — Ambulatory Visit (INDEPENDENT_AMBULATORY_CARE_PROVIDER_SITE_OTHER): Payer: BC Managed Care – PPO | Admitting: Psychology

## 2011-01-26 DIAGNOSIS — F988 Other specified behavioral and emotional disorders with onset usually occurring in childhood and adolescence: Secondary | ICD-10-CM

## 2011-01-26 DIAGNOSIS — F319 Bipolar disorder, unspecified: Secondary | ICD-10-CM

## 2011-01-26 MED ORDER — DEXMETHYLPHENIDATE HCL 10 MG PO TABS: 10.0000 mg | ORAL_TABLET | ORAL | Status: DC

## 2011-01-26 MED ORDER — DEXMETHYLPHENIDATE HCL ER 20 MG PO CP24: 20.0000 mg | ORAL_CAPSULE | Freq: Every day | ORAL | Status: DC

## 2011-01-26 NOTE — Progress Notes (Signed)
Patrick Elliott reports first on his new Focalin regimen - switched from Vyvanse one month ago.  He says that the 20 mg of Focalin have attenuated the tic some, had no benefit over the Vyvanse in terms of efficacy and have caused a "crash" in the morning that left him feeling "paralyzed" - unable to really do much until his morning dose took effect again (this was usually three hours).  He continues with headaches.

## 2011-01-26 NOTE — Patient Instructions (Addendum)
Please schedule a follow-up appt for June 20th at 10:30 (you can do it!). We discussed taking an immediate release (10 mg) at 10:00 a.m.  Take the extended release (20 mg white capsule) at 2 p.m.  Timing is important.  Dr. Kathrynn Running doesn't want you coming off of the immediate release without the extended release taking effect.  You can shift the timing as long as you keep the time between doses stable. You can call Dr. Kathrynn Running in the mean time if need be.

## 2011-01-26 NOTE — Assessment & Plan Note (Addendum)
Dr. Kathrynn Running provided options including returning to the Vyvanse or trying a combination of immediate and extended release focalin to hopefully shorten the time frame in the morning where he feels non-functional.  He also suggested an increase in the overall dose to 30 mg in hopes of increasing efficacy.  [name] elected to try the new focalin regimen and plans to follow-up in two weeks.  He should know faster than that if this is not a good match and was encouraged to call Dr. Kathrynn Running.  See patient instructions for specifics on the plan.

## 2011-01-26 NOTE — Assessment & Plan Note (Signed)
Focused on ADD issues today.  The current regimen of Focalin has had a negative impact on his mood as he has trouble getting out of bed in the morning and takes a while to get functional again.    At the end of our meeting, he brought up a recommendation from a previous meeting - about therapy.  Clarified what was meant.  He had called the therapist but they did not take Medicaid.  Provided two other names for him.  Will follow up to see if this was able to happen for him.

## 2011-01-27 ENCOUNTER — Encounter: Payer: Self-pay | Admitting: Family Medicine

## 2011-01-27 ENCOUNTER — Ambulatory Visit (INDEPENDENT_AMBULATORY_CARE_PROVIDER_SITE_OTHER): Payer: BC Managed Care – PPO | Admitting: Family Medicine

## 2011-01-27 DIAGNOSIS — M79609 Pain in unspecified limb: Secondary | ICD-10-CM

## 2011-01-27 DIAGNOSIS — M2170 Unequal limb length (acquired), unspecified site: Secondary | ICD-10-CM

## 2011-01-27 NOTE — Patient Instructions (Signed)
Use the custom orthotics in as many shoes as possible - these won't fit in all your shoes and that's ok. When you do buy new shoes, try to buy ones the orthotics will fit into. For all other shoes use heel lifts for cushion and to correct leg length difference. You can buy these as I showed you from the website. They typically need to be changed every 3 months or when you don't feel they're supporting you enough. I've added some additional padding to the one you have in the meantime. Follow up with Dr. Darrick Penna as needed.

## 2011-01-28 ENCOUNTER — Encounter: Payer: Self-pay | Admitting: Family Medicine

## 2011-01-28 NOTE — Assessment & Plan Note (Signed)
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  His main issue stems from the orthotics not fitting in most of his shoes.  We discussed that when he buys new shoes to take these with him to try to pick ones they will fit into as I believe these will help him tremendously.  Added blue cushioned posting laterally to his 3/4 length heel lift.  These have also helped for shoes in which orthotics do not fit.  I went over [employer] catalog and showed him how to order these and asked if he would be able to order them - about 9 dollars each and would last 2-4 months - he said he would.  If not, I advised him to follow up at [facility] office to get a couple extra pair (we are ordering these but are out of them).  They can be modified with more lateral cushion as I did today to help them last longer.  Green insole with heel lift also crowds his shoes so would not be a good alternative to the custom orthotics.

## 2011-01-28 NOTE — Progress Notes (Signed)
Subjective:    Patient ID: Patrick Elliott, male    DOB: July 26, 1961, 50 y.o.   MRN: [id number]  HPI  50 yo M returns for left lateral foot pain.  Patient has known DJD of midfoot with charcot changes. Has been seen at [facility] office, made custom orthotics after trial of temporary orthotics. Has known leg length discrepancy (left ~ 1.5 cm shorter than right). Orthotics made at last visit have helped but they do not fit in many of his shoes - cause crowding in most shoes even if he takes other inserts out of them. States the 3/4 heel lifts he was given did help tremendously but after a couple months they wear out laterally and pain recurs in lateral foot (foot also slides off these laterally).  Past Medical History  Diagnosis Date  . Hypertension   . Diabetes mellitus   . Anemia   . Cirrhosis   . CHF (congestive heart failure)     DIASTOLIC  . Tachycardia   . Dizziness   . Syncope and collapse     Current Outpatient Prescriptions on File Prior to Visit  Medication Sig Dispense Refill  . ACCU-CHEK AVIVA PLUS test strip USE AS DIRECTED  100 strip  3  . amitriptyline (ELAVIL) 25 MG tablet TAKE 1 TABLET BY MOUTH EVERY NIGHT AT BEDTIME  30 tablet  3  . dexmethylphenidate (FOCALIN XR) 20 MG 24 hr capsule Take 1 capsule (20 mg total) by mouth daily.  15 capsule  0  . dexmethylphenidate (FOCALIN) 10 MG tablet Take 10 mg by mouth as directed.        Marland Kitchen dexmethylphenidate (FOCALIN) 10 MG tablet Take 1 tablet (10 mg total) by mouth as directed.  15 tablet  0  . Gabapentin, PHN, (GRALISE) 600 MG TABS Take 3,000 mg by mouth daily.        . Glucagon, rDNA, (GLUCAGON EMERGENCY IJ) Use for sugars below 40       . INS SYRINGE/NEEDLE 1CC/29G (B-D INS SYR ULTRAFINE 1CC/29G) 29G X 1/2" 1 ML MISC Use three times a day as directed       . insulin lispro (HUMALOG) 100 UNIT/ML injection Inject 2-5 Units into the skin 3 (three) times daily before meals.  10 mL  3  . lamoTRIgine (LAMICTAL) 100 MG tablet  Per Dr[facility] Kath[facility].       Marland Kitchen Lancets (ACCU-CHEK MULTICLIX) lancets USE AS DIRECTED  100 each  3  . lisinopril (PRINIVIL,ZESTRIL) 20 MG tablet Take 1 tablet (20 mg total) by mouth daily.  30 tablet  11  . lithium (ESKALITH) 450 MG CR tablet TAKE 1 TABLET BY MOUTH EVERY DAY WITH FOOD  30 tablet  3  . Multiple Vitamin (MULTIVITAMIN) capsule Take 1 capsule by mouth daily.        . nebivolol (BYSTOLIC) 5 MG tablet Take 5 mg by mouth daily.       Marland Kitchen omeprazole (PRILOSEC) 20 MG capsule Take 20 mg by mouth daily.        . simvastatin (ZOCOR) 40 MG tablet Take 40 mg by mouth daily.        . sitaGLIPtan (JANUVIA) 100 MG tablet Take 1 tablet (100 mg total) by mouth daily. Same time each day  30 tablet  6  . SUMAtriptan (IMITREX) 50 MG tablet 1 TAB DAILY AS NEEDED FOR HEADACHE, MAY REPEAT ONCE IF NEEDED,DON'T TAKE MORE THAN TWICE A WEEK  10 tablet  3  . tadalafil (CIALIS)  20 MG tablet Take 5 mg by mouth as needed.        . zolpidem (AMBIEN) 10 MG tablet Take 1 tablet (10 mg total) by mouth at bedtime as needed.  30 tablet  0    History reviewed. No pertinent past surgical history.  Allergies  Allergen Reactions  . Sulfamethoxazole     REACTION: unspecified    History   Social History  . Marital Status: Divorced    Spouse Name: N/A    Number of Children: N/A  . Years of Education: N/A   Occupational History  . Not on file.   Social History Main Topics  . Smoking status: Former Smoker    Quit date: 04/22/2006  . Smokeless tobacco: Never Used  . Alcohol Use: No  . Drug Use: No  . Sexually Active: Not on file   Other Topics Concern  . Not on file   Social History Narrative  . No narrative on file    Family History  Problem Relation Age of Onset  . Hypertension Father     BP 151/85  Pulse 71  Temp(Src) 98.2 F (36.8 C) (Oral)  Ht 5\' 8"  (1.727 m)  Wt 173 lb 12.8 oz (78.835 kg)  BMI 26.43 kg/m2  Review of Systems See HPI above.    Objective:   Physical  Exam Gen: NAD  L foot: Long arch with only mild overpronation. Prominent deformity at lateral TMT joint with spurring.  Mild tenderness here throughout dorsal, lateral, plantar portions of foot.  No other TTP about foot or ankle. FROM ankle and toes. Strength 5/5 all motions. Negative ant drawer and talar tilt. Orthotics examined - well built with good arch support, additional heel lift with extra cushion laterally for left foot.  Fit well to his foot shape out of shoes. L leg 1.5 cm shorter both in lying and sitting positions.     Assessment & Plan:   1. L foot pain - 2/2 severe lateral midfoot DJD with charcot changes.  His main issue stems from the orthotics not fitting in most of his shoes.  We discussed that when he buys new shoes to take these with him to try to pick ones they will fit into as I believe these will help him tremendously.  Added blue cushioned posting laterally to his 3/4 length heel lift.  These have also helped for shoes in which orthotics do not fit.  I went over [employer] catalog and showed him how to order these and asked if he would be able to order them - about 9 dollars each and would last 2-4 months - he said he would.  If not, I advised him to follow up at [facility] office to get a couple extra pair (we are ordering these but are out of them).  They can be modified with more lateral cushion as I did today to help them last longer.  Green insole with heel lift also crowds his shoes so would not be a good alternative to the custom orthotics.    2. Unequal leg length - heel lift for left side - 3/4 length temporary ones for shoes the custom orthotics do not fit into.

## 2011-01-28 NOTE — Assessment & Plan Note (Signed)
heel lift for left side - 3/4 length temporary ones for shoes the custom orthotics do not fit into.

## 2011-02-09 ENCOUNTER — Ambulatory Visit (INDEPENDENT_AMBULATORY_CARE_PROVIDER_SITE_OTHER): Payer: BC Managed Care – PPO | Admitting: Psychology

## 2011-02-09 DIAGNOSIS — F988 Other specified behavioral and emotional disorders with onset usually occurring in childhood and adolescence: Secondary | ICD-10-CM

## 2011-02-09 DIAGNOSIS — F319 Bipolar disorder, unspecified: Secondary | ICD-10-CM

## 2011-02-09 MED ORDER — LISDEXAMFETAMINE DIMESYLATE 70 MG PO CAPS: 70.0000 mg | ORAL_CAPSULE | ORAL | Status: DC

## 2011-02-09 NOTE — Assessment & Plan Note (Addendum)
Focused visit to determine if Focalin or Vyvanse was deemed the better choice.  Patrick Elliott said the Focalin XR did not attenuate the morning issue (unable to get out of bed) hardly at all.  He does not note this issue with Vyvanse and wishes to return to this medication to manage his inattention.  Dr. Kathrynn Running wrote three months worth of medicine - next script will need to be written 05/12/11.

## 2011-02-09 NOTE — Progress Notes (Signed)
Patrick Elliott presents for a two-week follow up to determine which direction to head with regards to his medicine for attention.  He reports no clear benefit to the Focalin XR with continued difficulty getting up in the morning - such that he finds it intolerable.

## 2011-02-09 NOTE — Assessment & Plan Note (Signed)
Did not review today.  Not part of [name]'s agenda nor ours.  Function appears to be stable (as good as it has been) in this regard.

## 2011-02-09 NOTE — Patient Instructions (Signed)
Please schedule a follow-up for September 5th at 11:00.  You may not get a reminder call because the schedule is not yet complete. Dr. Kathrynn Running changed your prescription back to Vyvanse per your request.  I hope it helps. Tell [name] we said hi. Call us if you need anything ([phone]).

## 2011-02-17 ENCOUNTER — Other Ambulatory Visit: Payer: Self-pay | Admitting: Family Medicine

## 2011-02-17 NOTE — Telephone Encounter (Signed)
Refill request

## 2011-02-20 NOTE — Telephone Encounter (Signed)
Refill request

## 2011-02-22 ENCOUNTER — Other Ambulatory Visit: Payer: Self-pay | Admitting: Family Medicine

## 2011-02-22 NOTE — Telephone Encounter (Signed)
Refill request

## 2011-02-27 ENCOUNTER — Other Ambulatory Visit: Payer: Self-pay | Admitting: Family Medicine

## 2011-02-27 NOTE — Telephone Encounter (Signed)
Refill request

## 2011-03-08 ENCOUNTER — Other Ambulatory Visit: Payer: Self-pay | Admitting: Family Medicine

## 2011-03-08 NOTE — Telephone Encounter (Signed)
Refill request

## 2011-03-13 ENCOUNTER — Other Ambulatory Visit: Payer: Self-pay | Admitting: Family Medicine

## 2011-03-13 NOTE — Telephone Encounter (Signed)
Refill request

## 2011-03-14 NOTE — Telephone Encounter (Signed)
Refill request

## 2011-03-15 ENCOUNTER — Other Ambulatory Visit: Payer: Self-pay | Admitting: Psychology

## 2011-03-15 DIAGNOSIS — F319 Bipolar disorder, unspecified: Secondary | ICD-10-CM

## 2011-03-15 MED ORDER — ZOLPIDEM TARTRATE 10 MG PO TABS: 10.0000 mg | ORAL_TABLET | Freq: Every evening | ORAL | Status: DC | PRN

## 2011-03-15 NOTE — Telephone Encounter (Signed)
Refill request

## 2011-03-17 ENCOUNTER — Ambulatory Visit (INDEPENDENT_AMBULATORY_CARE_PROVIDER_SITE_OTHER): Payer: Medicaid Other | Admitting: Sports Medicine

## 2011-03-17 VITALS — BP 153/71 | HR 75

## 2011-03-17 DIAGNOSIS — M19079 Primary osteoarthritis, unspecified ankle and foot: Secondary | ICD-10-CM

## 2011-03-17 DIAGNOSIS — M2170 Unequal limb length (acquired), unspecified site: Secondary | ICD-10-CM

## 2011-03-17 DIAGNOSIS — G609 Hereditary and idiopathic neuropathy, unspecified: Secondary | ICD-10-CM

## 2011-03-17 NOTE — Assessment & Plan Note (Signed)
Larger correction is made for his orthotic today  We will consider adding corrections to any shoes that he uses regularly

## 2011-03-17 NOTE — Progress Notes (Signed)
  Subjective:    Patient ID: Patrick Elliott, male    DOB: December 18, 1960, 50 y.o.   MRN: [id number]  HPI  Coming back for input on chronic foot pain Foot pain is worse Particularly worse in mornings Has burning sensation in toes, worse at night, bothersome for a sheet to touch feet Takes gralise, amitriptyline 25 mg qhs.  Orthotics will only fit in 1 pair of his shoes   Has rash on left lower leg and foot, a little on rt over 2-3 months   Review of Systems     Objective:   Physical Exam     Rt ankle normal to testing, foot shows no abnormal testing or sores Has sensation to light touch on dorsal surface, decreased on plantar surface   Lt foot exam: Purpuric change starting from lower shin to distal lt foot Macules multiple more lateral than medial Dense callus pattern across forefoot Midfoot bossing extending from 5th cmc across mid foot to lateral side Limited ankle motion on lt  90 cm rt leg 87.5 cm on lt  Assessment & Plan:

## 2011-03-17 NOTE — Assessment & Plan Note (Signed)
I suggested trying to increase his amitriptyline to release 50 mg at night since he still has a lot of burning  He is on a good dose of Gralease

## 2011-03-17 NOTE — Assessment & Plan Note (Signed)
We will try to make additional adjustments to his orthotics to keep him from Trendelenburg shift to the left which will cause even more stress on the left midfoot which has extensive arthritis  Orthotics were modified with a felt lift and with a first ray post to try to keep him from feeling too unsteady  His gait was improved with these in place  I think in shoes that do not accommodate the orthotics we should at least put a second insole over a felt lift sense his leg length discrepancy is fairly large  He will try this and let me if he gets a good response

## 2011-03-18 ENCOUNTER — Encounter: Payer: Self-pay | Admitting: Family Medicine

## 2011-03-18 ENCOUNTER — Ambulatory Visit (INDEPENDENT_AMBULATORY_CARE_PROVIDER_SITE_OTHER): Payer: BC Managed Care – PPO | Admitting: Family Medicine

## 2011-03-18 VITALS — BP 128/80 | HR 60 | Temp 97.5°F | Ht 69.0 in | Wt 171.4 lb

## 2011-03-18 DIAGNOSIS — R51 Headache: Secondary | ICD-10-CM

## 2011-03-18 DIAGNOSIS — D692 Other nonthrombocytopenic purpura: Secondary | ICD-10-CM | POA: Insufficient documentation

## 2011-03-18 DIAGNOSIS — E119 Type 2 diabetes mellitus without complications: Secondary | ICD-10-CM

## 2011-03-18 LAB — POCT GLYCOSYLATED HEMOGLOBIN (HGB A1C): Hemoglobin A1C: 6.4

## 2011-03-18 NOTE — Patient Instructions (Signed)
I am going to check your blood counts today If they are low, we will refer you to a hematologist If they are normal, this may be due to a drug exposure--it may take a while to go away If you have bleeding or fevers, we want to see you back soon

## 2011-03-18 NOTE — Assessment & Plan Note (Signed)
?   From drugs or ITP related.  Hx of low platelets.  Will check CBC, give red flags, and possibly send to hematology if plts are low

## 2011-03-18 NOTE — Assessment & Plan Note (Signed)
2 months of purpura spreading from feet to calves.  No pain no itch

## 2011-03-18 NOTE — Progress Notes (Signed)
  Subjective:    Patient ID: Patrick Elliott, male    DOB: [birth year], 50 y.o.   MRN: [id number]  HPI Rash- on legs, started on feet, now spreading to calves.  No itch or pain.  No fevers or weight loss.   No easy bruising or bleeding  DM- went off [place] for 1 month, then insurance agreed to pay for it.  Feels CBGs are better controlled on it.  Checks CBGs 8x/day.  Taking mealtime novolog.  Counts carbs to regulate.  Occasionally having lows esp with exercise.  He can tell when he is low and will eat a snack.  A low is usually 70 for him  Peripheral neuropathy- increasingly painful.  Was increased to 50mg  elavil at night yesterday by Dr. Darrick Penna.    HA-  Having bad HA 3-5x/week.  Getting botox injections.  Sees [facility] center and has appt in the next two weeks.    Review of Systems See above     Objective:   Physical Exam  Vital signs reviewed General appearance - alert, well appearing, and in no distress and oriented to person, place, and time Heart - normal rate, regular rhythm, normal S1, S2, no murmurs, rubs, clicks or gallops Chest - clear to auscultation, no wheezes, rales or rhonchi, symmetric air entry, no tachypnea, retractions or cyanosis Skin- petechia that have turned brown on legs and feet.  Mostly on the front but some also on back of calves.  No mucous membrane involvement Feet- rash as above.  No ulcers or cuts      Assessment & Plan:   Purpura 2 months of purpura spreading from feet to calves.  No pain no itch  Headache HA 3-5 x per week.  On botox with wellness center.  Continue treatment with them  DIABETES MELLITUS II, UNCOMPLICATED Back on januvia.  Pt with occasional lows esp with exercise. Plan to hold novolog with meal before planned exercise.    Purpura ? From drugs or ITP related.  Hx of low platelets.  Will check CBC, give red flags, and possibly send to hematology if plts are low

## 2011-03-18 NOTE — Assessment & Plan Note (Signed)
Back on januvia.  Pt with occasional lows esp with exercise. Plan to hold novolog with meal before planned exercise.

## 2011-03-18 NOTE — Assessment & Plan Note (Signed)
HA 3-5 x per week.  On botox with wellness center.  Continue treatment with them

## 2011-03-19 ENCOUNTER — Other Ambulatory Visit: Payer: Self-pay | Admitting: Family Medicine

## 2011-03-19 ENCOUNTER — Encounter: Payer: Self-pay | Admitting: Family Medicine

## 2011-03-19 LAB — CBC
Hemoglobin: 14.3 g/dL (ref 13.0–17.0)
MCH: 28.7 pg (ref 26.0–34.0)
MCHC: 32.9 g/dL (ref 30.0–36.0)
RDW: 14.4 % (ref 11.5–15.5)

## 2011-03-20 NOTE — Telephone Encounter (Signed)
Refill request

## 2011-03-21 ENCOUNTER — Telehealth: Payer: Self-pay | Admitting: Family Medicine

## 2011-03-21 DIAGNOSIS — D696 Thrombocytopenia, unspecified: Secondary | ICD-10-CM

## 2011-03-21 NOTE — Telephone Encounter (Signed)
Left VM for pt.  Told him plts are up from baseline so probably not the cause of his rash but will refer to heme anyway to get their opinion

## 2011-03-21 NOTE — Telephone Encounter (Signed)
Got the message from Dr. Hulen Luster but wanted to speak with someone abut them as well.

## 2011-03-21 NOTE — Telephone Encounter (Signed)
To MD

## 2011-03-21 NOTE — Telephone Encounter (Signed)
Referral faxed to [facility] [place], Mary[place]

## 2011-03-22 NOTE — Telephone Encounter (Signed)
Talked to pt.  Rash is the same.  Reviewed results and plan for heme appt.

## 2011-03-23 ENCOUNTER — Other Ambulatory Visit: Payer: Self-pay | Admitting: Family Medicine

## 2011-03-23 NOTE — Telephone Encounter (Signed)
Refill request

## 2011-03-27 ENCOUNTER — Other Ambulatory Visit: Payer: Self-pay | Admitting: Family Medicine

## 2011-03-28 NOTE — Telephone Encounter (Signed)
Refill request

## 2011-03-30 ENCOUNTER — Other Ambulatory Visit: Payer: Self-pay | Admitting: Family Medicine

## 2011-03-30 NOTE — Telephone Encounter (Signed)
Refill request

## 2011-04-07 ENCOUNTER — Other Ambulatory Visit: Payer: Self-pay | Admitting: Family Medicine

## 2011-04-07 NOTE — Telephone Encounter (Signed)
Refill request

## 2011-04-10 ENCOUNTER — Other Ambulatory Visit: Payer: Self-pay | Admitting: Family Medicine

## 2011-04-11 NOTE — Telephone Encounter (Signed)
Refill request

## 2011-04-13 ENCOUNTER — Other Ambulatory Visit: Payer: Self-pay | Admitting: Family Medicine

## 2011-04-14 NOTE — Telephone Encounter (Signed)
Refill request

## 2011-04-15 NOTE — Telephone Encounter (Signed)
Refill request

## 2011-04-18 ENCOUNTER — Telehealth: Payer: Self-pay | Admitting: Psychology

## 2011-04-18 NOTE — Telephone Encounter (Signed)
Erroneous telephone encounter.

## 2011-04-19 ENCOUNTER — Other Ambulatory Visit: Payer: Self-pay | Admitting: Oncology

## 2011-04-19 ENCOUNTER — Encounter (HOSPITAL_BASED_OUTPATIENT_CLINIC_OR_DEPARTMENT_OTHER): Payer: BC Managed Care – PPO | Admitting: Oncology

## 2011-04-19 DIAGNOSIS — D696 Thrombocytopenia, unspecified: Secondary | ICD-10-CM

## 2011-04-19 LAB — CBC WITH DIFFERENTIAL/PLATELET
BASO%: 0.3 % (ref 0.0–2.0)
EOS%: 3.9 % (ref 0.0–7.0)
HCT: 40.3 % (ref 38.4–49.9)
MCH: 30.4 pg (ref 27.2–33.4)
MCHC: 35 g/dL (ref 32.0–36.0)
MCV: 87.1 fL (ref 79.3–98.0)
MONO%: 5.7 % (ref 0.0–14.0)
NEUT%: 70.8 % (ref 39.0–75.0)
RDW: 14.2 % (ref 11.0–14.6)
lymph#: 0.7 10*3/uL — ABNORMAL LOW (ref 0.9–3.3)

## 2011-04-20 LAB — PROTHROMBIN TIME: INR: 1.41 (ref <1.50)

## 2011-04-20 LAB — COMPREHENSIVE METABOLIC PANEL
ALT: 15 U/L (ref 0–53)
AST: 15 U/L (ref 0–37)
Albumin: 4.9 g/dL (ref 3.5–5.2)
Alkaline Phosphatase: 36 U/L — ABNORMAL LOW (ref 39–117)
BUN: 11 mg/dL (ref 6–23)
Chloride: 105 mEq/L (ref 96–112)
Potassium: 4.2 mEq/L (ref 3.5–5.3)
Sodium: 139 mEq/L (ref 135–145)
Total Bilirubin: 0.9 mg/dL (ref 0.3–1.2)
Total Protein: 6.9 g/dL (ref 6.0–8.3)

## 2011-04-26 ENCOUNTER — Other Ambulatory Visit: Payer: Self-pay | Admitting: Family Medicine

## 2011-04-26 NOTE — Telephone Encounter (Signed)
Refill request

## 2011-04-27 ENCOUNTER — Ambulatory Visit (INDEPENDENT_AMBULATORY_CARE_PROVIDER_SITE_OTHER): Payer: Medicaid Other | Admitting: Psychology

## 2011-04-27 ENCOUNTER — Other Ambulatory Visit: Payer: Self-pay | Admitting: *Deleted

## 2011-04-27 DIAGNOSIS — F988 Other specified behavioral and emotional disorders with onset usually occurring in childhood and adolescence: Secondary | ICD-10-CM

## 2011-04-27 DIAGNOSIS — F319 Bipolar disorder, unspecified: Secondary | ICD-10-CM

## 2011-04-27 MED ORDER — AMITRIPTYLINE HCL 75 MG PO TABS: 75.0000 mg | ORAL_TABLET | Freq: Every day | ORAL | Status: DC

## 2011-04-27 MED ORDER — LISDEXAMFETAMINE DIMESYLATE 70 MG PO CAPS: 70.0000 mg | ORAL_CAPSULE | ORAL | Status: DC

## 2011-04-27 NOTE — Progress Notes (Signed)
[name] had his list which included:  Attention getting worse and feeling the Vyvanse come on and go off.  He reports some increased anxiety and irritable mood.  Botox is helping with the headaches.  He notes some confusion as well (not remembering one thought from one room to another).  Upon further questioning, he thinks feeling the Vyvanse in his system is a good thing.  He thinks it does help despite the sense that his attention is not as good.

## 2011-04-27 NOTE — Patient Instructions (Addendum)
Please follow up on 07/27/11 at 11:30. You think the Vyvanse continues to help.  Dr. Kathrynn Running wrote three months of this medicine for you. Dr. Kathrynn Running recommended you discuss a trial of Lyrica with Dr. Hulen Luster.  It would likely replace the gabapentin and may allow you to decrease your amitriptyline dose.  There is some possibility that the amitriptyline is increasing your irritability.  We don't know for sure.  Lyrica may also help some with your anxious feelings. It was good to see you today.  Please tell [name] we said hi.

## 2011-04-27 NOTE — Progress Notes (Signed)
Pt called stating per Dr. Darrick Penna he had increased his amitriptyline to 75 mg qhs and this has helped the burning in his feet.  New rx sent per pt's request.

## 2011-04-27 NOTE — Assessment & Plan Note (Signed)
Report of Irritability and anxiety do not seem appreciably different from past reports however it is possible with an increased dose of amitriptyline (taking 50-75 mg depending on the day) that it could push him in this way.  Discussed options.  See instructions for recommendations.  Reiterated idea that function is a good predictor of how he is doing overall and that shifts in his mood and attention are likely a part of his life regardless of medication changes.

## 2011-04-27 NOTE — Assessment & Plan Note (Addendum)
Continues to derive benefit.  Tic still present.  Wants to continue the medication.  Dr. Kathrynn Running refilled for next three months.  Needs prescription for Vyvanse on 07/27/11

## 2011-05-02 ENCOUNTER — Other Ambulatory Visit: Payer: Self-pay | Admitting: Family Medicine

## 2011-05-02 NOTE — Telephone Encounter (Signed)
Refill request

## 2011-05-22 ENCOUNTER — Other Ambulatory Visit: Payer: Self-pay | Admitting: Family Medicine

## 2011-05-22 NOTE — Telephone Encounter (Signed)
Refill request

## 2011-05-23 ENCOUNTER — Telehealth: Payer: Self-pay | Admitting: *Deleted

## 2011-05-23 NOTE — Telephone Encounter (Signed)
PA required for sumatriptan. Form placed in MD box. 

## 2011-05-24 ENCOUNTER — Other Ambulatory Visit: Payer: Self-pay | Admitting: Family Medicine

## 2011-05-24 NOTE — Telephone Encounter (Signed)
Refill request

## 2011-05-27 ENCOUNTER — Other Ambulatory Visit: Payer: Self-pay | Admitting: Family Medicine

## 2011-05-27 MED ORDER — SUMATRIPTAN SUCCINATE 50 MG PO TABS: 50.0000 mg | ORAL_TABLET | ORAL | Status: DC | PRN

## 2011-05-27 NOTE — Telephone Encounter (Signed)
Pt is only allowed 18tabs/month.  I have resent the script.

## 2011-05-30 ENCOUNTER — Telehealth: Payer: Self-pay | Admitting: Psychology

## 2011-05-30 DIAGNOSIS — F319 Bipolar disorder, unspecified: Secondary | ICD-10-CM

## 2011-05-30 NOTE — Telephone Encounter (Signed)
Called patient after discussion with PCP and Dr. Kathrynn Running regarding Lithium level.  Last one drawn by [facility] was 05/19/2010.  Patrick Elliott recently had blood work done at [facility].  Does not know if Lithium was drawn.  He said he would call to inquire and let me know.    If it hasn't been drawn there, will need it before his scheduled 07/27/2011 follow-up with [facility].  Will ask him to come in if that is the case.

## 2011-06-01 NOTE — Assessment & Plan Note (Signed)
Has been over a year since last Lithium level.  PCP brought it to my attention.  Discussed with Dr. Kathrynn Running.  Will get lab.

## 2011-06-01 NOTE — Telephone Encounter (Signed)
Patient called to [facility] did not draw a Lithium level.  Will put order in and ask patient to set up lab appointment.  Will provide feedback and then follow with him as scheduled in December.

## 2011-06-01 NOTE — Telephone Encounter (Signed)
Addended by: [name] E on: 06/01/2011 03:54 PM   Modules accepted: Orders

## 2011-06-03 ENCOUNTER — Other Ambulatory Visit: Payer: Self-pay | Admitting: Family Medicine

## 2011-06-03 NOTE — Telephone Encounter (Signed)
Refill request

## 2011-06-04 ENCOUNTER — Other Ambulatory Visit: Payer: Self-pay | Admitting: Family Medicine

## 2011-06-05 NOTE — Telephone Encounter (Signed)
Refill request

## 2011-06-14 ENCOUNTER — Other Ambulatory Visit: Payer: Self-pay | Admitting: Family Medicine

## 2011-06-15 ENCOUNTER — Other Ambulatory Visit: Payer: BC Managed Care – PPO

## 2011-06-15 ENCOUNTER — Ambulatory Visit (INDEPENDENT_AMBULATORY_CARE_PROVIDER_SITE_OTHER): Payer: BC Managed Care – PPO | Admitting: Family Medicine

## 2011-06-15 ENCOUNTER — Encounter: Payer: Self-pay | Admitting: Family Medicine

## 2011-06-15 VITALS — BP 135/70 | HR 75 | Ht 69.0 in | Wt 174.0 lb

## 2011-06-15 DIAGNOSIS — M62830 Muscle spasm of back: Secondary | ICD-10-CM | POA: Insufficient documentation

## 2011-06-15 DIAGNOSIS — G609 Hereditary and idiopathic neuropathy, unspecified: Secondary | ICD-10-CM

## 2011-06-15 DIAGNOSIS — R51 Headache: Secondary | ICD-10-CM

## 2011-06-15 DIAGNOSIS — Z23 Encounter for immunization: Secondary | ICD-10-CM

## 2011-06-15 DIAGNOSIS — M5380 Other specified dorsopathies, site unspecified: Secondary | ICD-10-CM

## 2011-06-15 DIAGNOSIS — E119 Type 2 diabetes mellitus without complications: Secondary | ICD-10-CM

## 2011-06-15 DIAGNOSIS — F319 Bipolar disorder, unspecified: Secondary | ICD-10-CM

## 2011-06-15 MED ORDER — PREGABALIN 100 MG PO CAPS: 100.0000 mg | ORAL_CAPSULE | Freq: Three times a day (TID) | ORAL | Status: DC

## 2011-06-15 MED ORDER — PREGABALIN 50 MG PO CAPS: ORAL_CAPSULE | ORAL | Status: DC

## 2011-06-15 MED ORDER — CYCLOBENZAPRINE HCL 5 MG PO TABS: 5.0000 mg | ORAL_TABLET | Freq: Three times a day (TID) | ORAL | Status: AC | PRN

## 2011-06-15 NOTE — Progress Notes (Signed)
Lithium level done today Kaytelyn Glore 

## 2011-06-15 NOTE — Assessment & Plan Note (Signed)
Neuropathic pain is worse. We'll switch to Lyrica and see if this helps. We'll start at 50 mg 3 times a day and increased to 100 mg 3 times a day

## 2011-06-15 NOTE — Telephone Encounter (Signed)
Refill request

## 2011-06-15 NOTE — Assessment & Plan Note (Signed)
Lab Results  Component Value Date   HGBA1C 6.3 06/15/2011   Diabetes well controlled continue current management.

## 2011-06-15 NOTE — Progress Notes (Signed)
  Subjective:    Patient ID: Patrick Elliott, male    DOB: 07-05-1961, 50 y.o.   MRN: [id number]  HPI DM-patient taking Januvia. He is taking a 2-5 units sliding scale Humalog q. a.c. He says most of his sugars are between 150 and 175. His a.m. blood sugars are between 140 and 160. His highest was 375. He states that he occasionally feels low and gets lower than 125. At that point he'll feel lightheaded and has the shakes. He checks his blood sugar 8-10 times per day.  Patient reports thirst but he does not urinate more than usual. He watches his diet and exercise. He's tried the [facility] but he reports being very tired with exercise.  Headache-patient is going to the [facility] for headaches. He states these have improved from all day to one to 4 times per day. He describes them as screaming headaches. He has nausea and vomiting with these headaches, and also flashing lights and spots. They're in the front of his head. He is been getting Botox injection series.  Back pain-2 weeks of lower left-sided back pain. This feels dull and achy. It's worse when he leans over he denies any changes in his urination or bowel movements. He denies fever. Denies trauma.  Patient reports continued numbness and tingling in his upper and lower extremities. He had nerve conduction studies done at the [facility]. He showed moderate to severe peripheral neuropathy. He takes gabapentin for this.   Review of Systems Complains of HA.  Denies N/V/D or CP    Objective:   Physical Exam  Vital signs reviewed General appearance - alert, well appearing, and in no distress and oriented to person, place, and time Heart - normal rate, regular rhythm, normal S1, S2, no murmurs, rubs, clicks or gallops Chest - clear to auscultation, no wheezes, rales or rhonchi, symmetric air entry, no tachypnea, retractions or cyanosis Abdomen - soft, nontender, nondistended, no masses or organomegaly Extremities - peripheral pulses  normal, no pedal edema, no clubbing or cyanosis       Assessment & Plan:

## 2011-06-15 NOTE — Assessment & Plan Note (Signed)
No red flags today. Likely muscle strain. We'll give Flexeril to try. Gave instructions on back care.

## 2011-06-15 NOTE — Assessment & Plan Note (Signed)
Continuing to work with the wellness center. No changes today.

## 2011-06-15 NOTE — Patient Instructions (Signed)
I am giving you instructions for your back pain. I have sent in some Flexeril. You can take this and see if it helps. You can take this up to 3 times a day. It may make you sleepy so be careful before you're drive a car.  Your diabetes is under good control.  Lab Results  Component Value Date   HGBA1C 6.3 06/15/2011   For your peripheral neuropathy-we can switch to Lyrica. This may help you more.  Back Pain, Adult Low back pain is very common. About 1 in 5 people have back pain. The cause of low back pain is rarely dangerous. The pain often gets better over time. About half of people with a sudden onset of back pain feel better in just 2 weeks. About 8 in 10 people feel better by 6 weeks.   CAUSES Some common causes of back pain include:  Strain of the muscles or ligaments supporting the spine.     Wear and tear (degeneration) of the spinal discs.     Arthritis.    Direct injury to the back.  DIAGNOSIS Most of the time, the direct cause of low back pain is not known. However, back pain can be treated effectively even when the exact cause of the pain is unknown. Answering your caregiver's questions about your overall health and symptoms is one of the most accurate ways to make sure the cause of your pain is not dangerous. If your caregiver needs more information, he or she may order lab work or imaging tests (X-rays or MRIs). However, even if imaging tests show changes in your back, this usually does not require surgery. HOME CARE INSTRUCTIONS For many people, back pain returns. Since low back pain is rarely dangerous, it is often a condition that people can learn to manage on their own.    Remain active. It is stressful on the back to sit or stand in one place. Do not sit, drive, or stand in one place for more than 30 minutes at a time. Take short walks on level surfaces as soon as pain allows. Try to increase the length of time you walk each day.     Do not stay in bed. Resting more  than 1 or 2 days can delay your recovery.     Do not avoid exercise or work. Your body is made to move. It is not dangerous to be active, even though your back may hurt. Your back will likely heal faster if you return to being active before your pain is gone.     Pay attention to your body when you  bend and lift. Many people have less discomfort when lifting if they bend their knees, keep the load close to their bodies, and avoid twisting. Often, the most comfortable positions are those that put less stress on your recovering back.     Find a comfortable position to sleep. Use a firm mattress and lie on your side with your knees slightly bent. If you lie on your back, put a pillow under your knees.     Only take over-the-counter or prescription medicines as directed by your caregiver. Over-the-counter medicines to reduce pain and inflammation are often the most helpful. Your caregiver may prescribe muscle relaxant drugs. These medicines help dull your pain so you can more quickly return to your normal activities and healthy exercise.     Put ice on the injured area.     Put ice in a plastic bag.  Place a towel between your skin and the bag.     Leave the ice on for 15 to 20 minutes, 3 to 4 times a day for the first 2 to 3 days. After that, ice and heat may be alternated to reduce pain and spasms.     Ask your caregiver about trying back exercises and gentle massage. This may be of some benefit.     Avoid feeling anxious or stressed. Stress increases muscle tension and can worsen back pain. It is important to recognize when you are anxious or stressed and learn ways to manage it. Exercise is a great option.  SEEK MEDICAL CARE IF:  You have pain that is not relieved with rest or medicine.     You have pain that does not improve in 1 week.     You have new symptoms.     You are generally not feeling well.  SEEK IMMEDIATE MEDICAL CARE IF:    You have pain that radiates from your back  into your legs.     You develop new bowel or bladder control problems.     You have unusual weakness or numbness in your arms or legs.     You develop nausea or vomiting.     You develop abdominal pain.     You feel faint.  Document Released: 08/08/2005 Document Revised: 04/20/2011 Document Reviewed: 12/27/2010 [facility] Patient Information 2012 [place], [place].

## 2011-06-16 ENCOUNTER — Other Ambulatory Visit: Payer: BC Managed Care – PPO

## 2011-06-21 ENCOUNTER — Telehealth: Payer: Self-pay | Admitting: Psychology

## 2011-06-21 NOTE — Telephone Encounter (Signed)
Lithium level is same as it was one year ago.  Called patient and provided results.  Follow-up as previously scheduled.

## 2011-06-27 ENCOUNTER — Other Ambulatory Visit: Payer: Self-pay | Admitting: Cardiovascular Disease

## 2011-06-27 NOTE — Telephone Encounter (Signed)
Fax Received. Refill Completed. Kermitt Harjo Chowoe (M.A)  

## 2011-06-28 ENCOUNTER — Other Ambulatory Visit: Payer: Self-pay | Admitting: Family Medicine

## 2011-06-28 ENCOUNTER — Other Ambulatory Visit: Payer: Self-pay | Admitting: *Deleted

## 2011-06-28 ENCOUNTER — Other Ambulatory Visit: Payer: Self-pay | Admitting: Sports Medicine

## 2011-06-28 NOTE — Telephone Encounter (Signed)
Refill request

## 2011-06-29 NOTE — Telephone Encounter (Signed)
Refill request

## 2011-06-30 ENCOUNTER — Other Ambulatory Visit: Payer: Self-pay | Admitting: Family Medicine

## 2011-06-30 ENCOUNTER — Telehealth: Payer: Self-pay | Admitting: *Deleted

## 2011-06-30 NOTE — Telephone Encounter (Signed)
Refill request

## 2011-06-30 NOTE — Telephone Encounter (Addendum)
PA required for sumatriptan. Pharmacy states it will not go thru . Called pharmacy to make sure they try the RX with quanity of #18. Did not go thru. I have called [employer] and Medicaid. Medicaid states he is approved for #40 per month but it has to be filed with primary insurance first. This is the problem.  [employer] states they have no PA approval on file. Will need to fill out PA form. Placed in MD box. Patient received # 8 tabs on 05/31/2011. Will forward to Dr. Gwendolyn Grant.

## 2011-07-02 ENCOUNTER — Other Ambulatory Visit: Payer: Self-pay | Admitting: Family Medicine

## 2011-07-02 NOTE — Telephone Encounter (Signed)
Will complete PA form when I"m in clinic on Monday

## 2011-07-04 NOTE — Telephone Encounter (Signed)
Refill request

## 2011-07-04 NOTE — Telephone Encounter (Signed)
MD completed form and placed in to be faxed  box

## 2011-07-06 ENCOUNTER — Other Ambulatory Visit: Payer: Self-pay | Admitting: Family Medicine

## 2011-07-07 NOTE — Telephone Encounter (Signed)
Refill request

## 2011-07-08 ENCOUNTER — Other Ambulatory Visit: Payer: Self-pay | Admitting: *Deleted

## 2011-07-08 ENCOUNTER — Other Ambulatory Visit: Payer: Self-pay | Admitting: Cardiovascular Disease

## 2011-07-08 NOTE — Telephone Encounter (Signed)
Opened in Error.

## 2011-07-08 NOTE — Telephone Encounter (Signed)
Pharmacy calling to check on status of bystolic. Please return call to discuss further.

## 2011-07-11 ENCOUNTER — Other Ambulatory Visit: Payer: Self-pay | Admitting: Family Medicine

## 2011-07-11 NOTE — Telephone Encounter (Signed)
Refill request

## 2011-07-15 ENCOUNTER — Other Ambulatory Visit: Payer: Self-pay | Admitting: Family Medicine

## 2011-07-17 NOTE — Telephone Encounter (Signed)
Refill request

## 2011-07-19 NOTE — Telephone Encounter (Signed)
Refill request

## 2011-07-20 ENCOUNTER — Ambulatory Visit: Payer: BC Managed Care – PPO | Admitting: Psychology

## 2011-07-20 ENCOUNTER — Other Ambulatory Visit: Payer: Self-pay | Admitting: Family Medicine

## 2011-07-21 ENCOUNTER — Telehealth: Payer: Self-pay | Admitting: *Deleted

## 2011-07-21 NOTE — Telephone Encounter (Signed)
Refill request

## 2011-07-21 NOTE — Telephone Encounter (Signed)
PA required for Lyrica. Form placed in MD box. 

## 2011-07-22 NOTE — Telephone Encounter (Signed)
Refill request

## 2011-07-24 ENCOUNTER — Other Ambulatory Visit: Payer: Self-pay | Admitting: Family Medicine

## 2011-07-24 NOTE — Telephone Encounter (Signed)
Refill request

## 2011-07-27 ENCOUNTER — Ambulatory Visit (INDEPENDENT_AMBULATORY_CARE_PROVIDER_SITE_OTHER): Payer: BC Managed Care – PPO | Admitting: Psychology

## 2011-07-27 ENCOUNTER — Ambulatory Visit (INDEPENDENT_AMBULATORY_CARE_PROVIDER_SITE_OTHER): Payer: BC Managed Care – PPO | Admitting: Family Medicine

## 2011-07-27 DIAGNOSIS — F988 Other specified behavioral and emotional disorders with onset usually occurring in childhood and adolescence: Secondary | ICD-10-CM

## 2011-07-27 DIAGNOSIS — J029 Acute pharyngitis, unspecified: Secondary | ICD-10-CM

## 2011-07-27 MED ORDER — LISDEXAMFETAMINE DIMESYLATE 70 MG PO CAPS: 70.0000 mg | ORAL_CAPSULE | ORAL | Status: DC

## 2011-07-27 NOTE — Assessment & Plan Note (Signed)
Dr. Kathrynn Running wrote a prescription for Vyvanse to hold him until we are able to see him back.  Follow-up scheduled for 08/24/11 at 10:30.

## 2011-07-27 NOTE — Progress Notes (Signed)
Did not meet with patient.  Was seen for a SDA for flu like symptoms at the time of his appointment with [place].

## 2011-07-27 NOTE — Progress Notes (Signed)
  Subjective:    Patient ID: Patrick Elliott, male    DOB: [birth year], 50 y.o.   MRN: [id number]  HPI Scratchy throat x5 days: Patient states that he doesn't really have pain in his throat, but that it feels scratchy and irritated.feels like his voice is raspy. Occasionally will cough up dark colored phlegm. Positive body aches. Positive occasional nausea. Positive occasional feeling of weakness all over. Only occasional cough. No sinus pressure. No runny nose. No fever. No vomiting. No changes in appetite. Good fluid intake. Positive sick contact with similar symptoms-girlfriend. Has been using saltwater gargles for symptoms.   Review of Systems As per above    Objective:   Physical Exam  Constitutional: He is oriented to person, place, and time. He appears well-developed and well-nourished.  HENT:  Right Ear: External ear normal.  Left Ear: External ear normal.  Mouth/Throat: No oropharyngeal exudate.       Mild throat erythema. TMs normal bilateral  Cardiovascular: Normal rate.   Pulmonary/Chest: Effort normal and breath sounds normal. No respiratory distress. He has no wheezes. He has no rales.  Abdominal: Soft. He exhibits no distension.  Neurological: He is alert and oriented to person, place, and time.  Skin: No rash noted.  Psychiatric: He has a normal mood and affect. His behavior is normal.          Assessment & Plan:  And

## 2011-07-27 NOTE — Assessment & Plan Note (Signed)
Most likely resolving pharyngitis. Physical exam reassuring. Discussed symptomatic treatment. Discussed red flags that would warrant return. Patient states understanding.

## 2011-08-06 ENCOUNTER — Other Ambulatory Visit: Payer: Self-pay | Admitting: Family Medicine

## 2011-08-08 NOTE — Telephone Encounter (Signed)
Refill request

## 2011-08-10 NOTE — Telephone Encounter (Signed)
2nd request

## 2011-08-12 ENCOUNTER — Other Ambulatory Visit: Payer: Self-pay | Admitting: Family Medicine

## 2011-08-13 ENCOUNTER — Other Ambulatory Visit: Payer: Self-pay | Admitting: Family Medicine

## 2011-08-17 NOTE — Telephone Encounter (Signed)
Refill request

## 2011-08-19 ENCOUNTER — Other Ambulatory Visit: Payer: Self-pay | Admitting: Family Medicine

## 2011-08-19 NOTE — Telephone Encounter (Signed)
Refill request

## 2011-08-22 ENCOUNTER — Other Ambulatory Visit: Payer: Self-pay | Admitting: Family Medicine

## 2011-08-22 NOTE — Telephone Encounter (Signed)
Refill request

## 2011-08-24 ENCOUNTER — Ambulatory Visit (INDEPENDENT_AMBULATORY_CARE_PROVIDER_SITE_OTHER): Payer: BC Managed Care – PPO | Admitting: Psychology

## 2011-08-24 DIAGNOSIS — F988 Other specified behavioral and emotional disorders with onset usually occurring in childhood and adolescence: Secondary | ICD-10-CM

## 2011-08-24 DIAGNOSIS — F319 Bipolar disorder, unspecified: Secondary | ICD-10-CM

## 2011-08-24 MED ORDER — LISDEXAMFETAMINE DIMESYLATE 70 MG PO CAPS: 70.0000 mg | ORAL_CAPSULE | ORAL | Status: DC

## 2011-08-24 MED ORDER — ZOLPIDEM TARTRATE 10 MG PO TABS: 10.0000 mg | ORAL_TABLET | Freq: Every evening | ORAL | Status: DC | PRN

## 2011-08-24 NOTE — Telephone Encounter (Signed)
Refill request

## 2011-08-24 NOTE — Progress Notes (Signed)
[name] presents for follow up.  His list includes:  Moods, headaches, sleeping, depression, nose bleeds and thoughts.  We focused on moods (including depression), sleep and thoughts.  He reports periods of around 2-6 hours a day of mild to moderate depressed mood about five days a week.  He has occasional suicidal ideation that he describes as "maybe a blip."  Denies intent or plan.  He manages by continuing to do what he is doing whether it is resting or painting.  He thinks he manages these episodes moderately well by "waiting it out."  He also reports feelings of joy and happiness.  He thinks the ratio between these two mood states is about 1:1.  He sleeps well on Ambien but can only get 15 per month through his insurance.  He struggles with timing of taking them and dose because of sleepiness in the morning.  Discussed.  Thoughts that are troublesome vary.  He described one related to his painting that could be looked at from a CBT perspective.  Revisited the idea of therapy.

## 2011-08-24 NOTE — Patient Instructions (Signed)
Please schedule a follow-up for two months:  March 6th at 11:30. It was good to see you today. Dr. Kathrynn Running wrote you three months worth of Vyvannse as well as two months worth of Ambien. We think a therapist would be a wonderful addition to your treatment.  We gave you two recommendations.

## 2011-08-24 NOTE — Assessment & Plan Note (Addendum)
Report of mood is good.  Affect is euthymic.  His report today is the most balanced report we have had.  He stated that if we have "peaked" in terms of medication management then he is okay with this.  Dr. Kathrynn Elliott has exhausted medication options at this point and given Patrick Elliott's report of function and description of mood management, there has been significant improvement (although not to the point of remission).  Dr. Kathrynn Elliott told Patrick Elliott he could get 30 Ambien at [facility] for $6.  He asked Patrick Elliott to experiment with taking 10 mg each night at the same time.  Having a routine sleep schedule is a challenge for Patrick Elliott so not sure he will follow through.  Regular sleep might further help his mood.  Will discuss next visit.  Dr. Kathrynn Elliott provided Patrick Elliott six months worth of Vyvannse - last one dated 01/22/12.  He also wrote for two months worth of Ambien.  Will follow in two months or prn.

## 2011-08-27 ENCOUNTER — Telehealth: Payer: Self-pay | Admitting: Oncology

## 2011-08-27 NOTE — Telephone Encounter (Signed)
l/m with 10/18/11 appt,per mosaiq   aom

## 2011-09-03 ENCOUNTER — Other Ambulatory Visit: Payer: Self-pay | Admitting: Family Medicine

## 2011-09-04 NOTE — Telephone Encounter (Signed)
Refill request

## 2011-09-06 ENCOUNTER — Other Ambulatory Visit: Payer: Self-pay | Admitting: Family Medicine

## 2011-09-06 ENCOUNTER — Telehealth: Payer: Self-pay | Admitting: Psychology

## 2011-09-06 NOTE — Telephone Encounter (Signed)
Refill request

## 2011-09-06 NOTE — Telephone Encounter (Signed)
[name] left a VM stating therapy referrals do not accept medicaid.  [employer] is primary but apparently medicaid picks up the rest.  I called back and left him a VM with the web site:  [email or web address].  It has a Chiropodist that allows for selecting medicaid providers.  Asked him to call me back with questions or concerns.

## 2011-09-08 ENCOUNTER — Other Ambulatory Visit: Payer: Self-pay | Admitting: Family Medicine

## 2011-09-08 NOTE — Telephone Encounter (Signed)
Refill request

## 2011-09-09 ENCOUNTER — Other Ambulatory Visit: Payer: Self-pay | Admitting: Family Medicine

## 2011-09-09 NOTE — Telephone Encounter (Signed)
Refill request

## 2011-09-12 NOTE — Telephone Encounter (Signed)
Refill request-Dr. Hulen Luster out of office

## 2011-09-12 NOTE — Telephone Encounter (Signed)
Refill request-Dr. Spiegel out of office 

## 2011-09-16 ENCOUNTER — Other Ambulatory Visit: Payer: Self-pay | Admitting: Family Medicine

## 2011-09-16 NOTE — Telephone Encounter (Signed)
Refill request

## 2011-09-18 ENCOUNTER — Other Ambulatory Visit: Payer: Self-pay | Admitting: Family Medicine

## 2011-09-19 ENCOUNTER — Other Ambulatory Visit: Payer: Self-pay | Admitting: Family Medicine

## 2011-09-19 NOTE — Telephone Encounter (Signed)
Refill request

## 2011-09-20 ENCOUNTER — Other Ambulatory Visit: Payer: Self-pay | Admitting: Family Medicine

## 2011-09-20 NOTE — Telephone Encounter (Signed)
Refill request

## 2011-09-21 ENCOUNTER — Ambulatory Visit (INDEPENDENT_AMBULATORY_CARE_PROVIDER_SITE_OTHER): Payer: BC Managed Care – PPO | Admitting: Sports Medicine

## 2011-09-21 VITALS — BP 120/70

## 2011-09-21 DIAGNOSIS — M19079 Primary osteoarthritis, unspecified ankle and foot: Secondary | ICD-10-CM

## 2011-09-21 DIAGNOSIS — G609 Hereditary and idiopathic neuropathy, unspecified: Secondary | ICD-10-CM

## 2011-09-21 MED ORDER — AMITRIPTYLINE HCL 25 MG PO TABS: 25.0000 mg | ORAL_TABLET | Freq: Three times a day (TID) | ORAL | Status: DC

## 2011-09-21 NOTE — Assessment & Plan Note (Signed)
This is key source of "Charcot foot"  I think we need to modify amitriptyline to increase dose Wean off lyrica if not helping  Treatment trial and reck in 4 to 6 wks

## 2011-09-21 NOTE — Progress Notes (Signed)
  Subjective:    Patient ID: Patrick Elliott, male    DOB: [birth year], 51 y.o.   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 Amitriptyline has been most helpful for helping with night pain and sleep. Stopped gralise and gabapentin- as they were not helping. Increased standing or walking-increases L foot pain.   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

## 2011-09-21 NOTE — Patient Instructions (Signed)
Week 1- amitriptyline 25 in the morning, 75 at night and lyrica 3 times per day  Week 2- amitriptyline 25 morning, 25 early afternoon, and 75 at night, lyrica 3 times per day  Week 3- amitriptyline 25 morning, 25 early afternoon, and 100 at night, lyrica 2 times per day  Week 4- amitriptyline 25 morning, 25 early afternoon, and 100 at night, lyirca 1 time per day  Week 5- amitriptyline 25 morning, 25 early afternoon, and 100 at night.  Stop lyrica  Please follow up in 6 weeks  Thank you for seeing [place] today!

## 2011-09-21 NOTE — Assessment & Plan Note (Signed)
Cont with correction of leg length difference He has felt inserts These improve gait and take pressure off foot

## 2011-09-29 ENCOUNTER — Other Ambulatory Visit: Payer: Self-pay | Admitting: Family Medicine

## 2011-09-29 NOTE — Telephone Encounter (Signed)
Refill request

## 2011-10-01 ENCOUNTER — Other Ambulatory Visit: Payer: Self-pay | Admitting: Family Medicine

## 2011-10-04 ENCOUNTER — Other Ambulatory Visit: Payer: Self-pay | Admitting: Family Medicine

## 2011-10-05 NOTE — Telephone Encounter (Signed)
Refill request

## 2011-10-11 ENCOUNTER — Other Ambulatory Visit: Payer: Self-pay | Admitting: Family Medicine

## 2011-10-11 DIAGNOSIS — G609 Hereditary and idiopathic neuropathy, unspecified: Secondary | ICD-10-CM

## 2011-10-11 MED ORDER — PREGABALIN 100 MG PO CAPS: 100.0000 mg | ORAL_CAPSULE | Freq: Three times a day (TID) | ORAL | Status: DC

## 2011-10-18 ENCOUNTER — Encounter: Payer: Self-pay | Admitting: Cardiovascular Disease

## 2011-10-18 ENCOUNTER — Ambulatory Visit (INDEPENDENT_AMBULATORY_CARE_PROVIDER_SITE_OTHER): Payer: BC Managed Care – PPO | Admitting: Cardiovascular Disease

## 2011-10-18 ENCOUNTER — Ambulatory Visit (HOSPITAL_BASED_OUTPATIENT_CLINIC_OR_DEPARTMENT_OTHER): Payer: BC Managed Care – PPO | Admitting: Oncology

## 2011-10-18 ENCOUNTER — Other Ambulatory Visit: Payer: BC Managed Care – PPO | Admitting: Lab

## 2011-10-18 ENCOUNTER — Telehealth: Payer: Self-pay | Admitting: Oncology

## 2011-10-18 VITALS — BP 130/76 | HR 70 | Ht 66.0 in | Wt 179.4 lb

## 2011-10-18 VITALS — BP 112/64 | HR 65 | Temp 97.8°F | Ht 69.0 in | Wt 179.6 lb

## 2011-10-18 DIAGNOSIS — D696 Thrombocytopenia, unspecified: Secondary | ICD-10-CM

## 2011-10-18 DIAGNOSIS — I10 Essential (primary) hypertension: Secondary | ICD-10-CM

## 2011-10-18 DIAGNOSIS — D72819 Decreased white blood cell count, unspecified: Secondary | ICD-10-CM

## 2011-10-18 LAB — CBC WITH DIFFERENTIAL/PLATELET
Basophils Absolute: 0 10*3/uL (ref 0.0–0.1)
EOS%: 4.2 % (ref 0.0–7.0)
HCT: 39.5 % (ref 38.4–49.9)
HGB: 13.1 g/dL (ref 13.0–17.1)
LYMPH%: 27.9 % (ref 14.0–49.0)
MCH: 28 pg (ref 27.2–33.4)
MCV: 84.5 fL (ref 79.3–98.0)
MONO%: 10.4 % (ref 0.0–14.0)
NEUT%: 56.8 % (ref 39.0–75.0)
Platelets: 78 10*3/uL — ABNORMAL LOW (ref 140–400)
lymph#: 0.9 10*3/uL (ref 0.9–3.3)

## 2011-10-18 NOTE — Progress Notes (Signed)
Hematology and Oncology Follow Up Visit  Patrick Elliott [id number] [birth year] 51 y.o. 10/18/2011 9:11 AM [name], MD, MDSpiegel, Fleet Contras, MD   Principal Diagnosis:  Thrombocytopenia.  Current therapy: Observation  Interim History: : Patrick Elliott returns for a follow up for thrombocytopenia in the setting of a history of cirrhosis of the liver. He reports no bleeding issues during the last 6 months. No  hemoptysis, hematemesis,hematochezia, melena,or hematuria. His plantar discoloration is present intermittently, but improved with the use of compression stockings. Today, he denies the presence of these ecchymoses.  His neuropathy has worsened since last visit, managed by his primary physician. Does not report any alteration in mental status.  Does not report any psychiatric issues, depression.  Does not report any fever, chills, sweats.  Does not report any cough, nausea, vomiting.  No abdominal pain.Does not report any headaches, blurry vision, double vision.     Medications:   Current Outpatient Prescriptions  Medication Sig Dispense Refill  . ACCU-CHEK AVIVA PLUS test strip USE AS DIRECTED  100 strip  11  . amitriptyline (ELAVIL) 25 MG tablet Take 1 tablet (25 mg total) by mouth 3 (three) times daily.  120 tablet  3  . amitriptyline (ELAVIL) 75 MG tablet Take 1 tablet (75 mg total) by mouth at bedtime.  30 tablet  6  . BYSTOLIC 5 MG tablet TAKE 1 TABLET BY MOUTH EVERY DAY  30 tablet  11  . FOCALIN XR 20 MG 24 hr capsule       . Glucagon, rDNA, (GLUCAGON EMERGENCY IJ) Use for sugars below 40       . HUMALOG 100 UNIT/ML injection INJECT 2-5 UNITS INTO THE SKIN 3 TIMES A DAY BEFORE MEALS  10 mL  3  . JANUVIA 100 MG tablet TAKE 1 TABLET BY MOUTH EVERY DAY SAME TIME EACH DAY  30 tablet  6  . lamoTRIgine (LAMICTAL) 100 MG tablet Per Dr[facility].       Marland Kitchen lisdexamfetamine (VYVANSE) 70 MG capsule Take 1 capsule (70 mg total) by mouth every morning.  30 capsule  0  .  lisinopril (PRINIVIL,ZESTRIL) 20 MG tablet Take 1 tablet (20 mg total) by mouth daily.  30 tablet  11  . lithium carbonate (ESKALITH) 450 MG CR tablet TAKE 1 TABLET BY MOUTH EVERY DAY WITH FOOD  30 tablet  3  . Multiple Vitamin (MULTIVITAMIN) capsule Take 1 capsule by mouth daily.        Marland Kitchen omeprazole (PRILOSEC) 20 MG capsule TAKE ONE CAPSULE BY MOUTH DAILY  30 capsule  11  . pregabalin (LYRICA) 100 MG capsule Take 1 capsule (100 mg total) by mouth 3 (three) times daily.  90 capsule  3  . simvastatin (ZOCOR) 20 MG tablet TAKE 1 TABLET BY MOUTH EVERY DAY  30 tablet  6  . SUMAtriptan (IMITREX) 50 MG tablet Take 1 tablet (50 mg total) by mouth every 2 (two) hours as needed for migraine.  18 tablet  6  . tadalafil (CIALIS) 20 MG tablet Take 5 mg by mouth as needed.        . triamcinolone (KENALOG) 0.1 % ointment APPLY TO AFFECTED AREA 3 TIMES A DAY  80 g  0  . zolpidem (AMBIEN) 10 MG tablet Take 1 tablet (10 mg total) by mouth at bedtime as needed.  30 tablet  1  . JANUVIA 100 MG tablet TAKE 1 TABLET BY MOUTH EVERY DAY SAME TIME EACH DAY  90 tablet  1  .  JANUVIA 100 MG tablet TAKE 1 TABLET BY MOUTH EVERY DAY SAME TIME EACH DAY  30 tablet  6  . DISCONTD: HUMALOG 100 UNIT/ML injection INJECT 2-5 UNITS INTO THE SKIN 3 TIMES A DAY BEFORE MEALS  10 mL  3  . DISCONTD: omeprazole (PRILOSEC) 20 MG capsule TAKE ONE CAPSULE BY MOUTH DAILY  30 capsule  1  . DISCONTD: simvastatin (ZOCOR) 40 MG tablet Take 40 mg by mouth daily.        Marland Kitchen DISCONTD: zolpidem (AMBIEN) 10 MG tablet Take 1 tablet (10 mg total) by mouth at bedtime as needed.  30 tablet  5    Allergies:  Allergies  Allergen Reactions  . Sulfamethoxazole     REACTION: unspecified    Past Medical History, Surgical history, Social history, and Family History were reviewed and updated.  Review of Systems:  Remaining ROS negative.  Physical Exam:  Blood pressure 112/64, pulse 65, temperature 97.8 F (36.6 C), temperature source Oral, height 5\' 9"   (1.753 m), weight 179 lb 9.6 oz (81.466 kg). General Head is normocephalic, atraumatic.  Pupils equal, round, reactive to light.  Oral mucosa moist and pink.  Neck:  Supple. No lymphadenopathy.  Heart:  Regular rate and rhythm.  S1, S2.  Lungs:  Clear to auscultation.  No rhonchi, wheeze.  No dullness to percussion.  Abdomen:  Soft, nontender.  No hepatosplenomegaly.  Extremities:  No clubbing, cyanosis or edema.  No petechial rash Neurologic:  Intact motor, sensory and deep tendon reflexes.   Lab Results:   Lab 10/18/11 0823  WBC 3.1*  HGB 13.1  HCT 39.5  PLT 78*  MCV 84.5  MCH 28.0  MCHC 33.2  RDW 14.7*  LYMPHSABS 0.9  MONOABS 0.3  EOSABS 0.1  BASOSABS 0.0  BANDABS --    CMP   No results found for this basename: NA:5,K:5,CL:5,CO2:5,GLUCOSE:5,BUN:5,CREATININE:5,GFRCGP,:5,CALCIUM:5,MG:5,AST:5,ALT:5,ALKPHOS:5,BILITOT:5 in the last 168 hours      Component Value Date/Time   BILITOT 0.9 04/19/2011 1035   BILITOT 0.9 04/19/2011 1035    Radiological Studies:  No results found.   Impression and Plan:  A 51 year old gentleman with the following issues: 1. Thrombocytopenia.  The differential diagnosis today was discussed with Patrick Elliott in detail.  Certainly medication effect can contribute to that, but undoubtedly his disease is related to his cirrhosis of the liver.  Liver cirrhosis subsequently causes a portal hypertension, splenomegaly and possible sequestration of the platelets and causes the relative thrombocytopenia.  Also a bone marrow suppression associated with alcohol and history of alcohol abuse can cause bone marrow suppression and subsequently certainly thrombocytopenia.   He has not really had any spontaneous bleeding.  He had not had any really hospitalization or illnesses at this point.  For the time being really no intervention is needed.  He does not really require any platelet transfusions, and continued observation and followup will be needed at this point, as he  might need maybe a platelet transfusion or platelet-inducing agent if he has have to have any serious operation such as neurosurgical procedures. Will continue to monitor periodically. He will return in 6 months for a follow up visit with CBC and CMET. 2. Leukopenia.  His white cell counts is 3.1, without any evidence of any abnormalities on peripheral smear.  Again, this could be related to his splenomegaly and his liver disease which certainly can contribute to that.  There is really no evidence to suggest a primary hematological disorder. 3. Diffuse discoloration on the dorsal aspect of his  feet. No evidence of any petechiae and no hematological intervention recommendation related to that at this point.     Spent more than half the time coordinating care.    Marcos Eke, MD 2/26/20139:11 AM

## 2011-10-18 NOTE — Assessment & Plan Note (Signed)
Patrick Elliott is doing very well. His blood pressure is well controlled. I've encouraged him to continue with a good diet and exercise program. He has not had any recent episodes of diastolic congestive heart failure. I'll see him again in one year. We'll continue to follow with his general medical Dr.

## 2011-10-18 NOTE — Progress Notes (Signed)
Patrick Elliott Date of Birth  01-Mar-1961 [facility]     [facility]  [address]    [address]   [address] [place], [place]  [address]    [place], [place]  [address] [phone]  Fax  [phone]  [phone]  Fax [phone]  Problem list: 1. Hypertension 2. Diastolic congestive heart failure 3. Diabetes mellitus 4. Anemia 5.  Cirrhosis of the liver 7. Thrombocytopenia   History of Present Illness:   Patrick Elliott presents with no new cardiac complaints.  He was recently seen by Hematology for thrombocytopenia.     Current Outpatient Prescriptions on File Prior to Visit  Medication Sig Dispense Refill  . ACCU-CHEK AVIVA PLUS test strip USE AS DIRECTED  100 strip  11  . amitriptyline (ELAVIL) 25 MG tablet Take 1 tablet (25 mg total) by mouth 3 (three) times daily.  120 tablet  3  . amitriptyline (ELAVIL) 75 MG tablet Take 1 tablet (75 mg total) by mouth at bedtime.  30 tablet  6  . BYSTOLIC 5 MG tablet TAKE 1 TABLET BY MOUTH EVERY DAY  30 tablet  11  . Glucagon, rDNA, (GLUCAGON EMERGENCY IJ) Use for sugars below 40       . HUMALOG 100 UNIT/ML injection INJECT 2-5 UNITS INTO THE SKIN 3 TIMES A DAY BEFORE MEALS  10 mL  3  . JANUVIA 100 MG tablet TAKE 1 TABLET BY MOUTH EVERY DAY SAME TIME EACH DAY  30 tablet  6  . lamoTRIgine (LAMICTAL) 100 MG tablet Per Dr. Kath[facility]nn [facility] in [facility].       Marland Kitchen lisdexamfetamine (VYVANSE) 70 MG capsule Take 1 capsule (70 mg total) by mouth every morning.  30 capsule  0  . lisinopril (PRINIVIL,ZESTRIL) 20 MG tablet Take 1 tablet (20 mg total) by mouth daily.  30 tablet  11  . lithium carbonate (ESKALITH) 450 MG CR tablet TAKE 1 TABLET BY MOUTH EVERY DAY WITH FOOD  30 tablet  3  . Multiple Vitamin (MULTIVITAMIN) capsule Take 1 capsule by mouth daily.        Marland Kitchen omeprazole (PRILOSEC) 20 MG capsule TAKE ONE CAPSULE BY MOUTH DAILY  30 capsule  11  . pregabalin (LYRICA) 100 MG capsule Take 1 capsule (100 mg total) by mouth 3  (three) times daily.  90 capsule  3  . simvastatin (ZOCOR) 20 MG tablet TAKE 1 TABLET BY MOUTH EVERY DAY  30 tablet  6  . SUMAtriptan (IMITREX) 50 MG tablet Take 1 tablet (50 mg total) by mouth every 2 (two) hours as needed for migraine.  18 tablet  6  . tadalafil (CIALIS) 20 MG tablet Take 5 mg by mouth as needed.        . triamcinolone (KENALOG) 0.1 % ointment APPLY TO AFFECTED AREA 3 TIMES A DAY  80 g  0  . zolpidem (AMBIEN) 10 MG tablet Take 1 tablet (10 mg total) by mouth at bedtime as needed.  30 tablet  1  . FOCALIN XR 20 MG 24 hr capsule       . DISCONTD: HUMALOG 100 UNIT/ML injection INJECT 2-5 UNITS INTO THE SKIN 3 TIMES A DAY BEFORE MEALS  10 mL  3  . DISCONTD: omeprazole (PRILOSEC) 20 MG capsule TAKE ONE CAPSULE BY MOUTH DAILY  30 capsule  1  . DISCONTD: simvastatin (ZOCOR) 40 MG tablet Take 40 mg by mouth daily.        Marland Kitchen DISCONTD: zolpidem (AMBIEN) 10 MG  tablet Take 1 tablet (10 mg total) by mouth at bedtime as needed.  30 tablet  5    Allergies  Allergen Reactions  . Sulfamethoxazole     REACTION: unspecified    Past Medical History  Diagnosis Date  . Hypertension   . Diabetes mellitus   . Anemia   . Cirrhosis   . CHF (congestive heart failure)     DIASTOLIC  . Tachycardia   . Dizziness   . Syncope and collapse     No past surgical history on file.  History  Smoking status  . Former Smoker  . Quit date: 04/22/2006  Smokeless tobacco  . Never Used    History  Alcohol Use No    Family History  Problem Relation Age of Onset  . Hypertension Father     Reviw of Systems:  Reviewed in the HPI.  All other systems are negative.  Physical Exam: Blood pressure 130/76, pulse 70, height 5\' 6"  (1.676 m), weight 179 lb 6.4 oz (81.375 kg). General: Well developed, well nourished, in no acute distress.  Head: Normocephalic, atraumatic, sclera non-icteric, mucus membranes are moist,   Neck: Supple. Carotids are 2 + without bruits. No JVD  Lungs: Clear  bilaterally to auscultation.  Heart: regular rate  With normal  S1 S2. No murmurs, gallops or rubs.  Abdomen: Soft, non-tender, non-distended with normal bowel sounds. No hepatomegaly. No rebound/guarding. No masses.  Msk:  Strength and tone are normal  Extremities: No clubbing or cyanosis. No edema.  Distal pedal pulses are 2+ and equal bilaterally.  Neuro: Alert and oriented X 3. Moves all extremities spontaneously.  Psych:  Responds to questions appropriately with a normal affect.  ECG: Normal sinus rhythm with first degree AV block.  Assessment / Plan:

## 2011-10-18 NOTE — Telephone Encounter (Signed)
Appts made for 03/23/12 and pt placed in phone.      aom

## 2011-10-18 NOTE — Patient Instructions (Signed)
Your physician wants you to follow-up in: 1 year. You will receive a reminder letter in the mail two months in advance. If you don't receive a letter, please call our office to schedule the follow-up appointment.  

## 2011-10-26 ENCOUNTER — Encounter: Payer: Self-pay | Admitting: Sports Medicine

## 2011-10-26 ENCOUNTER — Ambulatory Visit (INDEPENDENT_AMBULATORY_CARE_PROVIDER_SITE_OTHER): Payer: BC Managed Care – PPO | Admitting: Psychology

## 2011-10-26 ENCOUNTER — Ambulatory Visit (INDEPENDENT_AMBULATORY_CARE_PROVIDER_SITE_OTHER): Payer: BC Managed Care – PPO | Admitting: Sports Medicine

## 2011-10-26 VITALS — BP 130/80

## 2011-10-26 DIAGNOSIS — G609 Hereditary and idiopathic neuropathy, unspecified: Secondary | ICD-10-CM

## 2011-10-26 DIAGNOSIS — R040 Epistaxis: Secondary | ICD-10-CM | POA: Insufficient documentation

## 2011-10-26 DIAGNOSIS — F319 Bipolar disorder, unspecified: Secondary | ICD-10-CM

## 2011-10-26 DIAGNOSIS — M19079 Primary osteoarthritis, unspecified ankle and foot: Secondary | ICD-10-CM

## 2011-10-26 MED ORDER — ZOLPIDEM TARTRATE 10 MG PO TABS: 10.0000 mg | ORAL_TABLET | Freq: Every evening | ORAL | Status: DC | PRN

## 2011-10-26 NOTE — Assessment & Plan Note (Signed)
Report of mood is euthymic today.  Affect is consistent although he is a little nervous about his nose bleeds (although he appears less anxious overall).  Thoughts are clear and goal directed.  His list is short today which the treatment team takes as a good sign.  We have maximized medical therapy.  He has pursued therapy and said there are no resources available to him in [place], where he lives.  Options in [place] are not possible secondary to transportation.  He said he will continue to have this on his list to pursue.    See patient instructions for further plan.

## 2011-10-26 NOTE — Assessment & Plan Note (Signed)
Cause of Charcot foot. He is doing better on higher dose of Amitriptyline. To continue dose as outlined in HPI. He is now off Lyrica.

## 2011-10-26 NOTE — Assessment & Plan Note (Signed)
Continue current inserts. Recommended to continue using better shoes such as [employer] also gave him names of other good shoes such as [employer] brand. To continue with felt inserts which he feels helps with leg correction as well.

## 2011-10-26 NOTE — Progress Notes (Signed)
Patrick Elliott presents for follow-up.  His list is shorter today and includes nose bleeds, less frequent headaches and about the same amount of mood issues (his last report was that he was able to manage them).

## 2011-10-26 NOTE — Progress Notes (Signed)
Patient ID: Patrick Elliott, male   DOB: Jun 05, 1961, 51 y.o.   MRN: [id number]  Subjective:    Patient ID: Patrick Elliott, male    DOB: 11/13/1960, 51 y.o.   MRN: [id number]  HPI 51 yo M with PMH significant for peripheral neuropathy, midfoot DJD of left foot, and Charcot joint who presents today with FU for the same.   Has increased aimtriptyline as per previous instructions (25 AM, 25 lunch, 100 mg QHS) and really feels this has improved his neuropathy.  Has weaned off Lyrica.   Most helpful for night pain and sleep.   Continues to have pain with weight-bearing.  Orthotics and better shoes (he is wearing Danskos) has helped somewhat with this .    Also of note, 1 day history of spontaneous nose bleed after blowing his nose.  Began last night, took about 3 hours to stop bleeding.  Recurred this AM with only few minutes of bleeding before spontaneously stopping. Last platelet count was 1 week ago, they were 78 at that time.  Followed by Hematology.  No lightheaded symptoms, chest pain, shortness of breath.  BP good today.  No other episodes bleeding, no gum bleeding when he brushes his teeth.        Review of Systems Paresthesias and numbness BL feet as usual neuropathic pain.  Otherwise ROS negative except as above.      Objective:   Physical Exam Gen:  Alert, cooperative patient who appears stated age in no acute distress.  Vital signs reviewed. Lt foot exam: Mid foot degenerative change.  Fused spurring of lat mid foot b/t 5th MTP and cuboid Good dorsi and plantar flexion, eversion and inversion good and without pain.   Walks with limp when not wearing shoes.   Sensation decreased Left foot, somewhat decreased Right foot as well.  HEENT:  /AT.  EOMI, PERRL.  Clot noted septum of Left nostril.  Unable to visualize any bleeding, erythematous area, or clot Right nostril.  MMM, tonsils non-erythematous, non-edematous.  External ears WNL, Bilateral TM's normal without retraction, redness  or bulging.        Assessment & Plan:

## 2011-10-26 NOTE — Assessment & Plan Note (Signed)
Likely related to thrombocytopenia, secondary to cirrhosis.   Spontaneously resolved.  He has nose packing he has purchased with him today.   Reviewed precautions, including packing of nose, and signs/symptoms that would trigger him to seek care at clinic or ED. Patient expressed understanding.

## 2011-10-26 NOTE — Patient Instructions (Signed)
Please schedule a follow-up for MDC on June 5th at 11:30.  You will be due for a refill on your Vyvanse and Ambien at that time. Sorry to hear about your nose bleeds.  Hope this is temporary. Glad to hear your list of mood issues was shorter this time.  I hope that means you are managing well.   Keep looking for a therapist. Call [phone].

## 2011-11-08 ENCOUNTER — Other Ambulatory Visit: Payer: Self-pay | Admitting: Sports Medicine

## 2011-11-17 ENCOUNTER — Ambulatory Visit (INDEPENDENT_AMBULATORY_CARE_PROVIDER_SITE_OTHER): Payer: BC Managed Care – PPO | Admitting: Family Medicine

## 2011-11-17 ENCOUNTER — Encounter: Payer: Self-pay | Admitting: Family Medicine

## 2011-11-17 VITALS — BP 110/62 | HR 78 | Ht 68.0 in | Wt 182.0 lb

## 2011-11-17 DIAGNOSIS — E119 Type 2 diabetes mellitus without complications: Secondary | ICD-10-CM

## 2011-11-17 DIAGNOSIS — I10 Essential (primary) hypertension: Secondary | ICD-10-CM

## 2011-11-17 DIAGNOSIS — F319 Bipolar disorder, unspecified: Secondary | ICD-10-CM

## 2011-11-17 DIAGNOSIS — E785 Hyperlipidemia, unspecified: Secondary | ICD-10-CM

## 2011-11-17 LAB — POCT GLYCOSYLATED HEMOGLOBIN (HGB A1C): Hemoglobin A1C: 6.8

## 2011-11-17 MED ORDER — LITHIUM CARBONATE ER 450 MG PO TBCR: 450.0000 mg | EXTENDED_RELEASE_TABLET | Freq: Every day | ORAL | Status: DC

## 2011-11-17 NOTE — Progress Notes (Signed)
  Subjective:    Patient ID: Patrick Elliott, male    DOB: 11-29-1960, 51 y.o.   MRN: [id number]  HPI HYPERTENSION Disease Monitoring Blood pressure range-not checking at home. Chest pain- none      Dyspnea- none Medications Compliance- taking as directed Lightheadedness- none   Edema- none   DIABETES Disease Monitoring Blood Sugar ranges-1:30 to 150.  Polyuria- none New Visual problems-some changes in vision. He has an appointment scheduled with his doctor in [place]. Medications Compliance- takes his Humalog and Januvia. He seems to have an eyeball method of counting carbs. He will occasionally get low after meals Hypoglycemic symptoms- He is having some lows after he eats.   HYPERLIPIDEMIA Disease Monitoring See symptoms for Hypertension Medications Compliance- taking simvastatin RUQ pain- none Muscle aches- none   Bipolar- Patient is followed by Dr. Kathrynn Running. He needs a refill of his lithium today. He feels like this medicine does well for him. He denies any increased urination.  ROS See HPI above   PMH Smoking Status noted     Review of Systems See above    Objective:   Physical Exam  Vital signs reviewed General appearance - alert, well appearing, and in no distress Heart - normal rate, regular rhythm, normal S1, S2, no murmurs, rubs, clicks or gallops Chest - clear to auscultation, no wheezes, rales or rhonchi, symmetric air entry, no tachypnea, retractions or cyanosis Foot exam-exam performed today. Normal skin. No evidence of callus or ulceration. Sensation is limited to midfoot. He is unable to feel microfilament on the forefoot or heel.      Assessment & Plan:

## 2011-11-17 NOTE — Assessment & Plan Note (Signed)
Well-controlled.  Continue current regimen. 

## 2011-11-17 NOTE — Assessment & Plan Note (Signed)
A1c at goal. Patient is to continue his current management but see Dr. Raymondo Band for evaluation of his low blood sugars. He is to bring all of his meter readings.

## 2011-11-17 NOTE — Assessment & Plan Note (Signed)
We'll check today. Continue simvastatin.

## 2011-11-17 NOTE — Assessment & Plan Note (Signed)
Stable on current regimen. Refilled lithium today. Check labs for lithium monitoring. Recheck labs 6 months.

## 2011-11-17 NOTE — Patient Instructions (Signed)
Thank you for coming in today Please make an appointment for pharmacy clinic and bring all of your blood sugar numbers I am refilling your lithium today I will call you if your labs have any issues

## 2011-11-18 LAB — COMPREHENSIVE METABOLIC PANEL
ALT: 17 U/L (ref 0–53)
AST: 15 U/L (ref 0–37)
Alkaline Phosphatase: 46 U/L (ref 39–117)
BUN: 17 mg/dL (ref 6–23)
Chloride: 105 mEq/L (ref 96–112)
Creat: 0.99 mg/dL (ref 0.50–1.35)
Total Bilirubin: 0.6 mg/dL (ref 0.3–1.2)

## 2011-11-18 LAB — CBC WITH DIFFERENTIAL/PLATELET
Basophils Absolute: 0 10*3/uL (ref 0.0–0.1)
Basophils Relative: 0 % (ref 0–1)
Eosinophils Absolute: 0.1 10*3/uL (ref 0.0–0.7)
Eosinophils Relative: 3 % (ref 0–5)
Lymphocytes Relative: 31 % (ref 12–46)
MCHC: 31.9 g/dL (ref 30.0–36.0)
MCV: 85.8 fL (ref 78.0–100.0)
Platelets: 74 10*3/uL — ABNORMAL LOW (ref 150–400)
RDW: 14.5 % (ref 11.5–15.5)
WBC: 2.5 10*3/uL — ABNORMAL LOW (ref 4.0–10.5)

## 2011-11-18 LAB — LIPID PANEL
HDL: 43 mg/dL (ref >39)
LDL Cholesterol: 75 mg/dL (ref 0–99)
Total CHOL/HDL Ratio: 3 Ratio
VLDL: 13 mg/dL (ref 0–40)

## 2011-11-22 ENCOUNTER — Other Ambulatory Visit: Payer: Self-pay | Admitting: Family Medicine

## 2011-11-23 ENCOUNTER — Other Ambulatory Visit: Payer: Self-pay | Admitting: Family Medicine

## 2011-11-28 ENCOUNTER — Other Ambulatory Visit: Payer: Self-pay | Admitting: Family Medicine

## 2011-11-29 ENCOUNTER — Other Ambulatory Visit: Payer: Self-pay | Admitting: Family Medicine

## 2011-11-29 ENCOUNTER — Ambulatory Visit: Payer: BC Managed Care – PPO | Admitting: Family Medicine

## 2011-11-30 NOTE — Progress Notes (Signed)
Bodyhelix ankle sleeve mailed to pt per his request.

## 2011-12-12 ENCOUNTER — Encounter: Payer: Self-pay | Admitting: Family Medicine

## 2011-12-16 ENCOUNTER — Other Ambulatory Visit: Payer: Self-pay | Admitting: Sports Medicine

## 2011-12-30 NOTE — Telephone Encounter (Signed)
Error

## 2011-12-30 NOTE — Telephone Encounter (Signed)
This encounter was created in error - please disregard.

## 2012-01-02 ENCOUNTER — Telehealth: Payer: Self-pay | Admitting: *Deleted

## 2012-01-02 NOTE — Telephone Encounter (Signed)
PA required forJanuvia. Form placed in Dr. Harlan Blas box.

## 2012-01-03 ENCOUNTER — Other Ambulatory Visit: Payer: BC Managed Care – PPO

## 2012-01-03 ENCOUNTER — Ambulatory Visit (INDEPENDENT_AMBULATORY_CARE_PROVIDER_SITE_OTHER): Payer: BC Managed Care – PPO | Admitting: Pharmacist

## 2012-01-03 ENCOUNTER — Telehealth: Payer: Self-pay | Admitting: *Deleted

## 2012-01-03 ENCOUNTER — Other Ambulatory Visit: Payer: Self-pay

## 2012-01-03 VITALS — BP 166/80 | HR 76 | Ht 70.0 in | Wt 180.2 lb

## 2012-01-03 DIAGNOSIS — E1149 Type 2 diabetes mellitus with other diabetic neurological complication: Secondary | ICD-10-CM

## 2012-01-03 DIAGNOSIS — E1140 Type 2 diabetes mellitus with diabetic neuropathy, unspecified: Secondary | ICD-10-CM

## 2012-01-03 DIAGNOSIS — E1142 Type 2 diabetes mellitus with diabetic polyneuropathy: Secondary | ICD-10-CM

## 2012-01-03 MED ORDER — SIMVASTATIN 20 MG PO TABS: 20.0000 mg | ORAL_TABLET | Freq: Every day | ORAL | Status: DC

## 2012-01-03 MED ORDER — EXENATIDE 5 MCG/0.02ML ~~LOC~~ SOPN: 5.0000 ug | PEN_INJECTOR | Freq: Two times a day (BID) | SUBCUTANEOUS | Status: DC

## 2012-01-03 MED ORDER — EXENATIDE 10 MCG/0.04ML ~~LOC~~ SOPN: 10.0000 ug | PEN_INJECTOR | Freq: Two times a day (BID) | SUBCUTANEOUS | Status: DC

## 2012-01-03 NOTE — Telephone Encounter (Signed)
..   Requested Prescriptions   Signed Prescriptions Disp Refills  . simvastatin (ZOCOR) 20 MG tablet 30 tablet 6    Sig: Take 1 tablet (20 mg total) by mouth daily.    Authorizing Provider: Vesta Mixer    Ordering User: Christella Hartigan, Jermya Dowding Judie Petit

## 2012-01-03 NOTE — Patient Instructions (Addendum)
Stop Januvia Initiate Byetta 5 mcg twice daily , inject immediately prior before meals for the next two pens Check blood sugar about three times daily on alternate meal times about two hours after meals  No change in the humalog dose  Schedule next visit end of June with Drs Hulen Luster and Raymondo Band

## 2012-01-03 NOTE — Telephone Encounter (Signed)
PA required for Byetta  and 10 mcg. Forms given to Dr. Hulen Luster.

## 2012-01-03 NOTE — Progress Notes (Signed)
  Subjective:    Patient ID: Patrick Elliott, male    DOB: 08-Oct-1960, 51 y.o.   MRN: [id number]  HPI Patient arrives alone, in good spirits. He brought his blood glucose monitoring log. Blood sugar readings mostly in the 130s-170s. Reports mild to moderate hypoglycemic events, denies use of glucagon recently. Reports feeling hungry regularly, uses humalog based on his diet and sometimes does not use in the morning.   Notes that Januvia was helpful in eliminating excess hypoglycemic episodes after initiation.   Review of Systems     Objective:   Physical Exam  Filed Vitals:   01/03/12 1423  BP: 166/80  Pulse: 76     Filed Weights   01/03/12 1423  Weight: 180 lb 3.2 oz (81.738 kg)      Assessment & Plan:   Diabetes of several yrs duration currently under good control of blood glucose based on  . Lab Results  Component Value Date   HGBA1C 6.8 11/17/2011  however patient continues to have erratic readings in the 300s and below 100s. Control is suboptimal due to erratic meal intake and self adjusted rapid acting insulin dosing. Manages hypoglycemic events appropriately, denies recent use of Glucagon.  Able to verbalize appropriate hypoglycemia management plan. Patient will continue to use Humalog 2-5 units before each meal until fasting CBGs reach goal or next visit. Switch from Januvia to Byetta and reassess potential improvement from GLP agonist.  Byetta 5 mcg twice daily to inject immediately before meals to minimize GI side effects reviewed purpose, proper use and potential adverse effects including pancreatitis risk. Written patient instructions provided.  Follow up in  Pharmacist Clinic Visit in a month.   Total time in face to face counseling 45 minutes.  Patient seen with Melynda Ripple, PharmD. Candidate and Janace Litten, PharmD, Pharmacy Resident. Marland Kitchen

## 2012-01-03 NOTE — Assessment & Plan Note (Signed)
Diabetes of several yrs duration currently under good control of blood glucose based on  . Lab Results  Component Value Date   HGBA1C 6.8 11/17/2011  however patient continues to have erratic readings in the 300s and below 100s. Control is suboptimal due to erratic meal intake and self adjusted rapid acting insulin dosing. Manages hypoglycemic events appropriately, denies recent use of Glucagon.  Able to verbalize appropriate hypoglycemia management plan. Patient will continue to use Humalog 2-5 units before each meal until fasting CBGs reach goal or next visit. Switch from Januvia to Byetta and reassess potential improvement from GLP agonist.  Byetta 5 mcg twice daily to inject immediately before meals to minimize GI side effects reviewed purpose, proper use and potential adverse effects including pancreatitis risk. Written patient instructions provided.  Follow up in  Pharmacist Clinic Visit in a month.   Total time in face to face counseling 45 minutes.  Patient seen with Melynda Ripple, PharmD. Candidate and Janace Litten, PharmD, Pharmacy Resident.

## 2012-01-04 ENCOUNTER — Other Ambulatory Visit: Payer: Self-pay | Admitting: *Deleted

## 2012-01-04 NOTE — Telephone Encounter (Signed)
Contacted  Dr. Hulen Luster and she has PA form and will get it sent in tomorrow.  Patient notified.

## 2012-01-04 NOTE — Telephone Encounter (Signed)
Patient is calling to check on the Prior Authorization.

## 2012-01-04 NOTE — Telephone Encounter (Signed)
Opened in Error.

## 2012-01-04 NOTE — Progress Notes (Signed)
Patient ID: Patrick Elliott, male   DOB: 02/29/1961, 51 y.o.   MRN: [id number] Reviewed and agree with Dr. Koval's management. 

## 2012-01-06 NOTE — Telephone Encounter (Signed)
PA for Byetta sent  in by Dr. Hulen Luster.  Patient was switched to this medication

## 2012-01-09 NOTE — Telephone Encounter (Signed)
Received note back from  medicaid that patient must have tried and failed metformin  before Byetta is approved.

## 2012-01-10 ENCOUNTER — Telehealth: Payer: Self-pay | Admitting: *Deleted

## 2012-01-10 NOTE — Telephone Encounter (Signed)
Dr. Ayesha Mohair sent PA back to [facility] stating why patient cannot be on metformin. Faxed

## 2012-01-10 NOTE — Telephone Encounter (Signed)
PA received for Januvia . Form placed in MD box.

## 2012-01-11 NOTE — Telephone Encounter (Signed)
Patrick Elliott has been approved form medicaid . Pharmacy notified.

## 2012-01-12 ENCOUNTER — Other Ambulatory Visit: Payer: Self-pay | Admitting: Family Medicine

## 2012-01-12 ENCOUNTER — Other Ambulatory Visit: Payer: Self-pay | Admitting: *Deleted

## 2012-01-12 ENCOUNTER — Telehealth: Payer: Self-pay | Admitting: Pharmacist

## 2012-01-12 MED ORDER — LISINOPRIL 20 MG PO TABS: 20.0000 mg | ORAL_TABLET | Freq: Every day | ORAL | Status: DC

## 2012-01-12 MED ORDER — SIMVASTATIN 20 MG PO TABS: 20.0000 mg | ORAL_TABLET | Freq: Every day | ORAL | Status: DC

## 2012-01-12 NOTE — Telephone Encounter (Signed)
Follow up with patient notifying patient that Byetta was approved.   Patient states he just checked online and noticed the same thing.  He anticipates picking Byetta up later today and starting.

## 2012-01-13 ENCOUNTER — Telehealth: Payer: Self-pay | Admitting: Family Medicine

## 2012-01-13 DIAGNOSIS — E119 Type 2 diabetes mellitus without complications: Secondary | ICD-10-CM

## 2012-01-13 NOTE — Telephone Encounter (Signed)
Since Byetta has been approved  Patient will no longer take Januvia. Pharmacy notified.

## 2012-01-13 NOTE — Telephone Encounter (Signed)
Did not get needles for the Byetta insulin  [facility] [address]

## 2012-01-13 NOTE — Telephone Encounter (Signed)
Will forward to MD.  

## 2012-01-15 ENCOUNTER — Other Ambulatory Visit: Payer: Self-pay | Admitting: Family Medicine

## 2012-01-17 ENCOUNTER — Other Ambulatory Visit: Payer: Self-pay

## 2012-01-17 MED ORDER — LISINOPRIL 20 MG PO TABS: 20.0000 mg | ORAL_TABLET | Freq: Every day | ORAL | Status: DC

## 2012-01-19 ENCOUNTER — Other Ambulatory Visit: Payer: Self-pay | Admitting: Cardiovascular Disease

## 2012-01-19 MED ORDER — SIMVASTATIN 20 MG PO TABS: 20.0000 mg | ORAL_TABLET | Freq: Every day | ORAL | Status: DC

## 2012-01-20 ENCOUNTER — Other Ambulatory Visit: Payer: Self-pay | Admitting: *Deleted

## 2012-01-20 NOTE — Telephone Encounter (Signed)
Opened in Error.

## 2012-01-23 MED ORDER — PEN NEEDLES 31G X 6 MM MISC: Status: DC

## 2012-01-23 NOTE — Telephone Encounter (Signed)
Patient needs needles that attach to the pens. I called pharmacy and gave order. #100 and 2 refills

## 2012-01-23 NOTE — Telephone Encounter (Signed)
It looks like Dr. Raymondo Band prescribed byetta pens.  Is that not what the patient got?

## 2012-01-25 ENCOUNTER — Ambulatory Visit (INDEPENDENT_AMBULATORY_CARE_PROVIDER_SITE_OTHER): Payer: BC Managed Care – PPO | Admitting: Psychology

## 2012-01-25 DIAGNOSIS — F319 Bipolar disorder, unspecified: Secondary | ICD-10-CM

## 2012-01-25 DIAGNOSIS — F988 Other specified behavioral and emotional disorders with onset usually occurring in childhood and adolescence: Secondary | ICD-10-CM

## 2012-01-25 MED ORDER — LISDEXAMFETAMINE DIMESYLATE 70 MG PO CAPS: 70.0000 mg | ORAL_CAPSULE | ORAL | Status: DC

## 2012-01-25 NOTE — Assessment & Plan Note (Signed)
Report of mood is similar and maybe slightly better than several previous reports.  Hard to get a gauge of mood stability or function as it is a moving target with Patrick Elliott.  The fact that he used the phrase "depression comes and goes and doesn't seem to be way up or way down" is a positive sign for him.  Dr. Kathrynn Running recommended continued treatment as is with further exploration of the DM issues.  See patient instructions for further plan.

## 2012-01-25 NOTE — Assessment & Plan Note (Signed)
Dr. Kathrynn Running thought that perhaps he was not getting full dose of Vyvanse secondary to liver issues.  Would consider increasing dose by 20 mg to see if it has a positive effect on staying on task.  Dr. Kathrynn Running thought best to wait until diabetes issues are settled.

## 2012-01-25 NOTE — Progress Notes (Signed)
Patrick Elliott presents for follow-up.  He has a list with multiple items including things like nose bleeds and starting a new DM medicine (Byetta).  Relevant to [facility], his list includes continued difficulty with staying on task, depression that comes and goes but doesn't seem to be way up or way down like previous reports, tiring easily so that he has to prioritize his activities to ensure he gets done what needs to get done and drifting and / or rapid thoughts.  Dr. Kathrynn Running noticed weight gain.  Patrick Elliott reports 8-10 pounds last month.  Was 2 pounds heavier in March of this year.

## 2012-01-25 NOTE — Patient Instructions (Signed)
Please schedule a follow-up for:  September 4th at 11:00. Good to see you today Patrick Elliott.  Tell [name] we said hi. Dr. Kathrynn Running did not recommend any changes today until you figure out what you are going to do with the Byetta vs. Januvia.  Dr. Kathrynn Running thought that, based on your report, having to schedule meal times might be a little tricky for you.   Three prescriptions were provided for your Vyvanse.  Next refill would be due for 10/2.

## 2012-01-30 ENCOUNTER — Other Ambulatory Visit: Payer: Self-pay | Admitting: Family Medicine

## 2012-01-30 ENCOUNTER — Ambulatory Visit (INDEPENDENT_AMBULATORY_CARE_PROVIDER_SITE_OTHER): Payer: BC Managed Care – PPO | Admitting: Family Medicine

## 2012-01-30 ENCOUNTER — Encounter: Payer: Self-pay | Admitting: Family Medicine

## 2012-01-30 VITALS — BP 117/70 | HR 77 | Ht 70.0 in | Wt 180.0 lb

## 2012-01-30 DIAGNOSIS — M25569 Pain in unspecified knee: Secondary | ICD-10-CM

## 2012-01-31 ENCOUNTER — Other Ambulatory Visit: Payer: Self-pay | Admitting: *Deleted

## 2012-01-31 ENCOUNTER — Encounter: Payer: Self-pay | Admitting: Family Medicine

## 2012-01-31 NOTE — Progress Notes (Signed)
  Subjective:    Patient ID: Patrick Elliott, male    DOB: 14-Dec-1960, 51 y.o.   MRN: [id number]  HPI  Knee pain, hx of ACL rupture wit  Repair 20 y ago. Over last few weeks he has noted pain when he turns over in bed. Pain is sharp and medial. Alsosome pain with certain motions of extension of knee. No locking, no giving way. No swelling or erythema of joinnt. Mostly wants to make sure he is not further injuring his knee.  Review of Systems See hpi    Objective:   Physical Exam  Vital signs reviewed. GENERAL: Well developed, well nourished, no acute distress KNEE: well healed midline incision. Full extension and flexion. TTP medial joint line area. No effusion.  ULTRASOUND:  Patellar and quadricep tendons intact. No effusion. Medial meniscus is slightly asymmetric but no definite tear. Lateral meniscus appears normal.      Assessment & Plan:  Knee pain---hx of ACL tear and repair (he thinks it was bone/patellar tendon repair but not sure). Ithink this is some mild synovial inflammation. We discussed options and he really does not want  To do anything for it---just wanted reassurance he was not further injuring it. rtc prn

## 2012-01-31 NOTE — Telephone Encounter (Signed)
Opened in Error.

## 2012-02-02 ENCOUNTER — Other Ambulatory Visit: Payer: Self-pay | Admitting: *Deleted

## 2012-02-02 NOTE — Telephone Encounter (Signed)
We have received two additional PA requests for Januvia. Called pharmacy again and ask them to cancel RX. Verified with Dr. Hulen Luster.   Medication was changed.

## 2012-02-02 NOTE — Telephone Encounter (Signed)
Opened in Error.

## 2012-02-03 ENCOUNTER — Ambulatory Visit (INDEPENDENT_AMBULATORY_CARE_PROVIDER_SITE_OTHER): Payer: BC Managed Care – PPO | Admitting: Pharmacist

## 2012-02-03 ENCOUNTER — Encounter: Payer: Self-pay | Admitting: Pharmacist

## 2012-02-03 VITALS — BP 124/76 | HR 69 | Ht 69.9 in | Wt 180.5 lb

## 2012-02-03 DIAGNOSIS — E1149 Type 2 diabetes mellitus with other diabetic neurological complication: Secondary | ICD-10-CM

## 2012-02-03 DIAGNOSIS — G609 Hereditary and idiopathic neuropathy, unspecified: Secondary | ICD-10-CM

## 2012-02-03 DIAGNOSIS — E1142 Type 2 diabetes mellitus with diabetic polyneuropathy: Secondary | ICD-10-CM

## 2012-02-03 DIAGNOSIS — E1140 Type 2 diabetes mellitus with diabetic neuropathy, unspecified: Secondary | ICD-10-CM

## 2012-02-03 DIAGNOSIS — R05 Cough: Secondary | ICD-10-CM

## 2012-02-03 MED ORDER — NORTRIPTYLINE HCL 25 MG PO CAPS: ORAL_CAPSULE | ORAL | Status: DC

## 2012-02-03 MED ORDER — BENZONATATE 100 MG PO CAPS: 100.0000 mg | ORAL_CAPSULE | Freq: Three times a day (TID) | ORAL | Status: AC | PRN

## 2012-02-03 NOTE — Progress Notes (Signed)
Patient ID: Patrick Elliott, male   DOB: 02/14/1961, 51 y.o.   MRN: [id number] Reviewed and agree with Dr. Macky Lower management.

## 2012-02-03 NOTE — Assessment & Plan Note (Signed)
51yoM with improved DM control based on patient's blood sugar log, less erratic blood sugars, and less shakiness with normal blood sugars. Will increase Byetta to 10 mcg twice daily as tolerated. Will change amitriptyline to nortriptyline to see if patient experiences decreased sedation and improved neuropathy pain. Return for follow-up at the end of July. Total time in face- to face counseling = 25 minutes. Patient seen with Benjaman Pott, PharmD Resident

## 2012-02-03 NOTE — Progress Notes (Signed)
  Subjective:    Patient ID: Patrick Elliott, male    DOB: Dec 15, 1960, 51 y.o.   MRN: [id number]  HPI  51yoM presents for DM follow-up. Now off Januvia and is using Byetta 5 mcg injections. Noted decreased appetite. Reports not having any shakiness now when BG <130's and feels being less eratic with blood sugars. Had 1 low that he thought may need glucagon but did not end up using it. Blood sugas mostly in 110's. Diet generally consists mostly of yogurt, granola bars, fish, some veggies and fruits, diet coke, coconut water, tofu, ice cream. Has only been needing 2-4 units of insulin.  Complains of cough and pain with cough. Dr. Hulen Luster evaluated patient and recommends tessalon pearles. Patient also complains of worsening neuropathy and severe burning sensation at bedtime. Already taking high doses of pregabalin and amitriptyline with complaints of "morning hangover."   Review of Systems     Objective:   Physical Exam  Filed Vitals:   02/03/12 1118  BP: 124/76  Pulse: 69       Assessment & Plan:   51yoM with improved DM control based on patient's blood sugar log, less erratic blood sugars, and less shakiness with normal blood sugars. Will increase Byetta to 10 mcg twice daily as tolerated. Will change amitriptyline to nortriptyline to see if patient experiences decreased sedation and improved neuropathy pain. Return for follow-up at the end of July. Total time in face- to face counseling = 25 minutes. Patient seen with Benjaman Pott, PharmD Resident

## 2012-02-03 NOTE — Assessment & Plan Note (Signed)
Likely viral cough. Gave Tessalon Perles today. Advised to see back in 10 days if no better.

## 2012-02-03 NOTE — Progress Notes (Signed)
  Subjective:    Patient ID: Patrick Elliott, male    DOB: [birth year], 51 y.o.   MRN: [id number]  HPI  Saw patient during Dr. Macky Lower visit for cough. No fevers. Has a tickle and some pain in his throat. No shortness of breath. No sick contacts.  Review of Systems No abdominal pain, nausea, vomiting, diarrhea    Objective:   Physical Exam Vital signs reviewed General appearance - alert, well appearing, and in no distress Heart - normal rate, regular rhythm, normal S1, S2, no murmurs, rubs, clicks or gallops Chest - clear to auscultation, no wheezes, rales or rhonchi, symmetric air entry, no tachypnea, retractions or cyanosis Ears - bilateral TM's and external ear canals normal, right ear normal, left ear normal Nose - normal and patent, no erythema, discharge or polyps Throat-diffuse redness without drainage or tonsillar enlargement No cervical lymphadenopathy, no thyroid enlargement       Assessment & Plan:

## 2012-02-03 NOTE — Patient Instructions (Addendum)
  1) Increase your Byetta to 10 mcg twice daily (around meals). If you experience issues, call the clinic. 2) [facility]. 3) You can continue to use Mucinex to help loosen up mucus in your lungs. 4) We are changing your amitriptyline to NORTRIPTYLINE. [facility]  Follow-up with Dr. Raymondo Band in 4-6 weeks. Call us before then if you have any issues.

## 2012-02-07 ENCOUNTER — Other Ambulatory Visit: Payer: Self-pay | Admitting: *Deleted

## 2012-02-07 NOTE — Telephone Encounter (Signed)
Opened in Error.

## 2012-02-08 ENCOUNTER — Other Ambulatory Visit: Payer: Self-pay | Admitting: Sports Medicine

## 2012-02-09 ENCOUNTER — Other Ambulatory Visit: Payer: Self-pay | Admitting: Cardiovascular Disease

## 2012-02-09 MED ORDER — LISINOPRIL 20 MG PO TABS: 20.0000 mg | ORAL_TABLET | Freq: Every day | ORAL | Status: DC

## 2012-02-15 ENCOUNTER — Other Ambulatory Visit: Payer: Self-pay | Admitting: Family Medicine

## 2012-02-17 ENCOUNTER — Ambulatory Visit: Payer: BC Managed Care – PPO | Admitting: Pharmacist

## 2012-02-21 ENCOUNTER — Other Ambulatory Visit: Payer: Self-pay | Admitting: Family Medicine

## 2012-02-24 ENCOUNTER — Other Ambulatory Visit: Payer: Self-pay | Admitting: *Deleted

## 2012-02-24 MED ORDER — LISINOPRIL 20 MG PO TABS: 20.0000 mg | ORAL_TABLET | Freq: Every day | ORAL | Status: DC

## 2012-02-24 NOTE — Telephone Encounter (Signed)
Refilled lisinopril

## 2012-02-27 ENCOUNTER — Telehealth: Payer: Self-pay | Admitting: Cardiovascular Disease

## 2012-02-27 NOTE — Telephone Encounter (Signed)
Error

## 2012-03-01 ENCOUNTER — Other Ambulatory Visit: Payer: Self-pay | Admitting: Family Medicine

## 2012-03-02 ENCOUNTER — Other Ambulatory Visit: Payer: Self-pay | Admitting: Family Medicine

## 2012-03-02 ENCOUNTER — Other Ambulatory Visit: Payer: Self-pay | Admitting: *Deleted

## 2012-03-02 MED ORDER — LISINOPRIL 20 MG PO TABS: 20.0000 mg | ORAL_TABLET | Freq: Every day | ORAL | Status: DC

## 2012-03-04 ENCOUNTER — Other Ambulatory Visit: Payer: Self-pay | Admitting: Family Medicine

## 2012-03-05 ENCOUNTER — Other Ambulatory Visit: Payer: Self-pay | Admitting: Family Medicine

## 2012-03-06 ENCOUNTER — Other Ambulatory Visit: Payer: Self-pay | Admitting: *Deleted

## 2012-03-06 NOTE — Telephone Encounter (Signed)
Opened in Error.

## 2012-03-08 ENCOUNTER — Other Ambulatory Visit: Payer: Self-pay | Admitting: Family Medicine

## 2012-03-12 ENCOUNTER — Other Ambulatory Visit: Payer: Self-pay | Admitting: Family Medicine

## 2012-03-13 ENCOUNTER — Other Ambulatory Visit: Payer: Self-pay | Admitting: Family Medicine

## 2012-03-15 ENCOUNTER — Telehealth: Payer: Self-pay | Admitting: Oncology

## 2012-03-15 NOTE — Telephone Encounter (Signed)
Pt called and r/s August appt to 9/4.

## 2012-03-20 ENCOUNTER — Ambulatory Visit: Payer: BC Managed Care – PPO | Admitting: Pharmacist

## 2012-03-23 ENCOUNTER — Ambulatory Visit: Payer: BC Managed Care – PPO | Admitting: Oncology

## 2012-03-23 ENCOUNTER — Other Ambulatory Visit: Payer: BC Managed Care – PPO

## 2012-03-28 ENCOUNTER — Other Ambulatory Visit: Payer: Self-pay | Admitting: Family Medicine

## 2012-03-29 ENCOUNTER — Other Ambulatory Visit: Payer: Self-pay | Admitting: Family Medicine

## 2012-03-29 ENCOUNTER — Encounter: Payer: Self-pay | Admitting: Pharmacist

## 2012-03-29 ENCOUNTER — Ambulatory Visit (INDEPENDENT_AMBULATORY_CARE_PROVIDER_SITE_OTHER): Payer: BC Managed Care – PPO | Admitting: Pharmacist

## 2012-03-29 VITALS — BP 163/84 | HR 104 | Ht 69.0 in | Wt 185.0 lb

## 2012-03-29 DIAGNOSIS — E1142 Type 2 diabetes mellitus with diabetic polyneuropathy: Secondary | ICD-10-CM

## 2012-03-29 DIAGNOSIS — G609 Hereditary and idiopathic neuropathy, unspecified: Secondary | ICD-10-CM

## 2012-03-29 DIAGNOSIS — E1149 Type 2 diabetes mellitus with other diabetic neurological complication: Secondary | ICD-10-CM

## 2012-03-29 DIAGNOSIS — E1140 Type 2 diabetes mellitus with diabetic neuropathy, unspecified: Secondary | ICD-10-CM

## 2012-03-29 MED ORDER — PREGABALIN 100 MG PO CAPS: 100.0000 mg | ORAL_CAPSULE | Freq: Three times a day (TID) | ORAL | Status: DC

## 2012-03-29 NOTE — Assessment & Plan Note (Signed)
  Neuropathy remains uncontrolled per patient report.  Patient has tried multiple agents without success.   Options for neuropathy management at f/u for consideration include lidoderm patch.

## 2012-03-29 NOTE — Progress Notes (Signed)
  Subjective:    Patient ID: Patrick Elliott, male    DOB: 1961/05/11, 51 y.o.   MRN: [id number]  HPI Pt presents for follow up with his DM management.  Appears well.  Has been taking 10 mcg exenatide BID for a few weeks now.  Denies any nausea, but states his appetite has decreased.  Is able to self-adjust his mealtime humalog dose based on his meal content; typically uses 3-5 units, but is able to eat some healthier meals entirely without mealtime insulin.  Erratic mealtimes; usually twice daily at 2PM and 7PM.  CBG readings in the mid-100s.   Review of Systems Complains of episodic burning in both big toes. Numbness also an issue.    Objective:   Physical Exam        Assessment & Plan:   Diabetes of several yrs duration currently under good control of blood glucose based on  . Lab Results  Component Value Date   HGBA1C 6.8 11/17/2011     ,home fasting CBG readings of 115-150.  Still has difficulty managing lows, requiring glucagon.  Due to erratic mealtimes and trouble remembering Byetta doses, will switch to Victoza (0.6 mg SQ Qday for 3-4 days, then 1.2 mg SQ Qday) once finished with Byetta.  Pt instructed to keep mealtime insulin in the 2-3 unit range.    Neuropathy remains uncontrolled per patient report.  Patient has tried multiple agents without success.   Options for neuropathy management at f/u for consideration include lidoderm patch.  Written patient instructions provided.  Follow up in  Pharmacist Clinic Visit in one month.   Total time in face to face counseling 45 minutes.  Patient seen with Bernadene Person, PharmD Candidate and Drue Stager, PharmD, Pharmacy Resident.

## 2012-03-29 NOTE — Patient Instructions (Addendum)
It was good to see you today.  Continue with mealtime insulin; aim for doses in the 2-3 unit range.  You can take your Byetta 30-60 min before meals.  Keep track of your low times in your log.  Start using your Victoza pen instead of the Byetta pen.  Start with 0.6 mg once a day.  After 3-4 days start using the 1.2 mg dose.  We will follow up at your next visit to see how it is working for you.

## 2012-03-29 NOTE — Assessment & Plan Note (Addendum)
  Diabetes of several yrs duration currently under good control of blood glucose based on  . Lab Results  Component Value Date   HGBA1C 6.8 11/17/2011     ,home fasting CBG readings of 115-150.  Still has difficulty managing lows, requiring glucagon.  Due to erratic mealtimes and trouble remembering Byetta doses, will switch to Victoza (0.6 mg SQ Qday for 3-4 days, then 1.2 mg SQ Qday) once finished with Byetta.  Pt instructed to keep mealtime insulin in the 2-3 unit range.    Neuropathy remains uncontrolled per patient report.  Patient has tried multiple agents without success.   Options for neuropathy management at f/u for consideration include lidoderm patch.  Written patient instructions provided.  Follow up in  Pharmacist Clinic Visit in one month.   Total time in face to face counseling 45 minutes.  Patient seen with Bernadene Person, PharmD Candidate and Drue Stager, PharmD, Pharmacy Resident.

## 2012-03-30 ENCOUNTER — Other Ambulatory Visit: Payer: Self-pay | Admitting: Family Medicine

## 2012-03-30 NOTE — Progress Notes (Signed)
Patient ID: Patrick Elliott, male   DOB: 12-13-1960, 51 y.o.   MRN: [id number] Reviewed and agree with Dr. Macky Lower documentation and management.

## 2012-04-09 ENCOUNTER — Other Ambulatory Visit: Payer: Self-pay | Admitting: Family Medicine

## 2012-04-09 DIAGNOSIS — G609 Hereditary and idiopathic neuropathy, unspecified: Secondary | ICD-10-CM

## 2012-04-09 MED ORDER — PREGABALIN 100 MG PO CAPS: 100.0000 mg | ORAL_CAPSULE | Freq: Three times a day (TID) | ORAL | Status: DC

## 2012-04-12 ENCOUNTER — Other Ambulatory Visit: Payer: Self-pay | Admitting: Family Medicine

## 2012-04-13 ENCOUNTER — Other Ambulatory Visit: Payer: Self-pay | Admitting: Family Medicine

## 2012-04-19 ENCOUNTER — Telehealth: Payer: Self-pay | Admitting: Family Medicine

## 2012-04-19 NOTE — Telephone Encounter (Signed)
Patient is calling to check on the Prior Auth for the Victoza.

## 2012-04-20 ENCOUNTER — Other Ambulatory Visit: Payer: Self-pay | Admitting: Family Medicine

## 2012-04-24 MED ORDER — LIRAGLUTIDE 18 MG/3ML ~~LOC~~ SOLN: 0.6000 mg | Freq: Every day | SUBCUTANEOUS | Status: DC

## 2012-04-24 NOTE — Telephone Encounter (Signed)
Prior auth in progress New Rx for Victoza called in to pharmacy.

## 2012-04-25 ENCOUNTER — Other Ambulatory Visit: Payer: Self-pay | Admitting: Family Medicine

## 2012-04-25 ENCOUNTER — Ambulatory Visit (HOSPITAL_BASED_OUTPATIENT_CLINIC_OR_DEPARTMENT_OTHER): Payer: BC Managed Care – PPO | Admitting: Oncology

## 2012-04-25 ENCOUNTER — Other Ambulatory Visit (HOSPITAL_BASED_OUTPATIENT_CLINIC_OR_DEPARTMENT_OTHER): Payer: BC Managed Care – PPO | Admitting: Lab

## 2012-04-25 ENCOUNTER — Ambulatory Visit (INDEPENDENT_AMBULATORY_CARE_PROVIDER_SITE_OTHER): Payer: BC Managed Care – PPO | Admitting: Psychology

## 2012-04-25 VITALS — BP 113/62 | HR 72 | Temp 97.4°F | Resp 20 | Ht 69.0 in | Wt 179.5 lb

## 2012-04-25 DIAGNOSIS — D72819 Decreased white blood cell count, unspecified: Secondary | ICD-10-CM

## 2012-04-25 DIAGNOSIS — D696 Thrombocytopenia, unspecified: Secondary | ICD-10-CM

## 2012-04-25 DIAGNOSIS — F988 Other specified behavioral and emotional disorders with onset usually occurring in childhood and adolescence: Secondary | ICD-10-CM

## 2012-04-25 DIAGNOSIS — F319 Bipolar disorder, unspecified: Secondary | ICD-10-CM

## 2012-04-25 DIAGNOSIS — K7460 Unspecified cirrhosis of liver: Secondary | ICD-10-CM

## 2012-04-25 LAB — CBC WITH DIFFERENTIAL/PLATELET
Basophils Absolute: 0 10*3/uL (ref 0.0–0.1)
HCT: 37.9 % — ABNORMAL LOW (ref 38.4–49.9)
HGB: 12.8 g/dL — ABNORMAL LOW (ref 13.0–17.1)
MONO#: 0.3 10*3/uL (ref 0.1–0.9)
NEUT%: 61.3 % (ref 39.0–75.0)
WBC: 2.9 10*3/uL — ABNORMAL LOW (ref 4.0–10.3)
lymph#: 0.8 10*3/uL — ABNORMAL LOW (ref 0.9–3.3)

## 2012-04-25 LAB — COMPREHENSIVE METABOLIC PANEL (CC13)
ALT: 25 U/L (ref 0–55)
CO2: 21 mEq/L — ABNORMAL LOW (ref 22–29)
Chloride: 105 mEq/L (ref 98–107)
Sodium: 134 mEq/L — ABNORMAL LOW (ref 136–145)
Total Bilirubin: 0.7 mg/dL (ref 0.20–1.20)
Total Protein: 6.4 g/dL (ref 6.4–8.3)

## 2012-04-25 MED ORDER — LISDEXAMFETAMINE DIMESYLATE 70 MG PO CAPS: 70.0000 mg | ORAL_CAPSULE | ORAL | Status: DC

## 2012-04-25 MED ORDER — ZOLPIDEM TARTRATE 10 MG PO TABS: 10.0000 mg | ORAL_TABLET | Freq: Every evening | ORAL | Status: DC | PRN

## 2012-04-25 NOTE — Assessment & Plan Note (Signed)
Mood remains stable on current medications.  No new complaints.  Function remains stable as well.  No change in treatment recommended.  Patient is in agreement.  Rewrote Ambien.  Six months worth.

## 2012-04-25 NOTE — Telephone Encounter (Signed)
Received PA for Victoza. Form placed in MD box .

## 2012-04-25 NOTE — Progress Notes (Signed)
Hematology and Oncology Follow Up Visit  Patrick Elliott [id number] [birth year] 51 y.o. 04/25/2012 1:43 PM Patrick Elliott, MDHairford, Ricki Miller, MD   Principal Diagnosis:  51 year old with thrombocytopenia likely due to liver disease and cirrhosis of the liver.   Current therapy: Observation  Interim History: : Patrick Elliott returns for a follow up for thrombocytopenia in the setting of a history of cirrhosis of the liver and thrombocytopenia. He is here for a follow up visit. He reports no bleeding issues during the last 6 months. No  hemoptysis, hematemesis,hematochezia, melena,or hematuria. Today, he denies the presenc ecchymoses. He does not report any alteration in mental status.  Does not report any fever, chills, sweats.  Does not report any cough, nausea, vomiting.  No abdominal pain.Does not report any headaches, blurry vision, double vision.     Medications:   Current Outpatient Prescriptions  Medication Sig Dispense Refill  . BD PEN NEEDLE NANO U/F 32G X 4 MM MISC USE AS DIRECTED TWICE A DAY WITH BYETTA  100 each  11  . BYSTOLIC 5 MG tablet TAKE 1 TABLET BY MOUTH EVERY DAY  30 tablet  11  . Glucagon, rDNA, (GLUCAGON EMERGENCY IJ) Use for sugars below 40       . HUMALOG 100 UNIT/ML injection INJECT 2-5 UNITS INTO THE SKIN 3 TIMES A DAY BEFORE MEALS  10 mL  3  . Insulin Pen Needle (PEN NEEDLES) 31G X 6 MM MISC Please provide pen needles for Byetta pen .  100 each  2  . lamoTRIgine (LAMICTAL) 25 MG tablet TAKE 2 TABLETS BY MOUTH TWICE A DAY  120 tablet  0  . Lancets (ACCU-CHEK MULTICLIX) lancets USE AS DIRECTED  102 each  3  . Lancets (ACCU-CHEK MULTICLIX) lancets USE AS DIRECTED  102 each  3  . Liraglutide (VICTOZA) 18 MG/3ML SOLN Inject 0.1 mLs (0.6 mg total) into the skin daily.  6 mL  5  . lisdexamfetamine (VYVANSE) 70 MG capsule Take 1 capsule (70 mg total) by mouth every morning.  30 capsule  0  . lisdexamfetamine (VYVANSE) 70 MG capsule Take 1 capsule (70 mg total) by mouth every  morning.  30 capsule  0  . lisdexamfetamine (VYVANSE) 70 MG capsule Take 1 capsule (70 mg total) by mouth every morning.  30 capsule  0  . lisdexamfetamine (VYVANSE) 70 MG capsule Take 1 capsule (70 mg total) by mouth every morning.  30 capsule  0  . lisdexamfetamine (VYVANSE) 70 MG capsule Take 1 capsule (70 mg total) by mouth every morning. Three prescriptions written for 7/2, 8/2 and 9/2.  Next refill scheduled for 10/2.  Per Dr. Kathrynn Running in [facility].  30 capsule  0  . lisdexamfetamine (VYVANSE) 70 MG capsule Take 1 capsule (70 mg total) by mouth every morning.  30 capsule  0  . lisinopril (PRINIVIL,ZESTRIL) 20 MG tablet Take 1 tablet (20 mg total) by mouth daily.  30 tablet  6  . lithium carbonate (ESKALITH) 450 MG CR tablet Take 1 tablet (450 mg total) by mouth daily.  30 tablet  6  . Multiple Vitamin (MULTIVITAMIN) capsule Take 1 capsule by mouth daily.        . nortriptyline (PAMELOR) 25 MG capsule TAKE ONE CAPSULE BY MOUTH IN THE MORNING AND 1 CAPSULE AT LUNCH TIME AND 2 CAPS AT BEDTIME  120 capsule  2  . omeprazole (PRILOSEC) 20 MG capsule TAKE ONE CAPSULE BY MOUTH DAILY  30 capsule  11  . pregabalin (  LYRICA) 100 MG capsule Take 1 capsule (100 mg total) by mouth 3 (three) times daily.  90 capsule  3  . simvastatin (ZOCOR) 20 MG tablet Take 1 tablet (20 mg total) by mouth daily.  30 tablet  6  . SUMAtriptan (IMITREX) 50 MG tablet Take 1 tablet (50 mg total) by mouth every 2 (two) hours as needed for migraine.  18 tablet  6  . tadalafil (CIALIS) 20 MG tablet Take 5 mg by mouth as needed.        . triamcinolone ointment (KENALOG) 0.1 %       . zolpidem (AMBIEN) 10 MG tablet Take 1 tablet (10 mg total) by mouth at bedtime as needed.  30 tablet  5  . ACCU-CHEK AVIVA PLUS test strip USE AS DIRECTED  100 strip  11  . DISCONTD: lisdexamfetamine (VYVANSE) 70 MG capsule Take 1 capsule (70 mg total) by mouth every morning. Three prescriptions written for 7/2, 8/2 and 9/2.  Next refill scheduled for 10/2.   Per Dr. Kathrynn Running in [facility].  30 capsule  0  . DISCONTD: nortriptyline (PAMELOR) 25 MG capsule Take 1 capsule in the morning, 1 at lunchtime, and 2 capsules at bedtime. This will replace amitriptyline.  120 capsule  2  . DISCONTD: zolpidem (AMBIEN) 10 MG tablet Take 1 tablet (10 mg total) by mouth at bedtime as needed.  30 tablet  3    Allergies:  Allergies  Allergen Reactions  . Sulfamethoxazole Rash    Past Medical History, Surgical history, Social history, and Family History were reviewed and updated.  Review of Systems:  Remaining ROS negative.  Physical Exam:  Blood pressure 113/62, pulse 72, temperature 97.4 F (36.3 C), temperature source Oral, resp. rate 20, height 5\' 9"  (1.753 m), weight 179 lb 8 oz (81.421 kg). General Head is normocephalic, atraumatic.  Pupils equal, round, reactive to light.  Oral mucosa moist and pink.  Neck:  Supple. No lymphadenopathy.  Heart:  Regular rate and rhythm.  S1, S2.  Lungs:  Clear to auscultation.  No rhonchi, wheeze.  No dullness to percussion.  Abdomen:  Soft, nontender.  No hepatosplenomegaly.  Extremities:  No clubbing, cyanosis or edema.  No petechial rash Neurologic:  Intact motor, sensory and deep tendon reflexes.   Lab Results:   Lab 04/25/12 1317  WBC 2.9*  HGB 12.8*  HCT 37.9*  PLT 65*  MCV 80.6  MCH 27.2  MCHC 33.8  RDW 14.2  LYMPHSABS 0.8*  MONOABS 0.3  EOSABS 0.1  BASOSABS 0.0  BANDABS --    Impression and Plan:  A 51 year old gentleman with the following issues: 1. Thrombocytopenia.  The differential diagnosis today was discussed with Patrick Elliott in detail but undoubtedly his disease is related to his cirrhosis of the liver.  Liver cirrhosis subsequently causes a portal hypertension, splenomegaly and possible sequestration of the platelets and causes the relative thrombocytopenia.  Also a bone marrow suppression associated with alcohol and history of alcohol abuse can cause bone marrow suppression and subsequently  certainly thrombocytopenia.   He has not really had any spontaneous bleeding.  He had not had any really hospitalization or illnesses at this point.  For the time being really no intervention is needed.  He does not really require any platelet transfusions, and continued observation. He is to follow up as needed and I will be happy to see him at any point felt needed. 2. Leukopenia.  without any evidence of any abnormalities on peripheral smear.  Again,  this could be related to his splenomegaly and his liver disease which certainly can contribute to that.  There is really no evidence to suggest a primary hematological disorder.   Eli Hose, MD 9/4/20131:43 PM

## 2012-04-25 NOTE — Assessment & Plan Note (Signed)
Stable on Vyvanse.  Dr. Kathrynn Running thinks that whatever focus issues remain Patrick Elliott will either need to use behavioral strategies or tolerate.  Patrick Elliott is not interested in behavioral strategies.  He agrees that tolerating them is the remaining option.  Refilled six months worth of Vyvanse.  Will need refills in March, 2014.

## 2012-04-25 NOTE — Progress Notes (Signed)
Patrick Elliott presents for follow-up.  His amitriptyline was changed to nortriptyline and his DM medication is getting changed as well.  He is weathering this well.  He has a list but his biggest issue remains focus.  He says he can easily get distracted and ends up finishing nothing.  This is not different (better or worse) than it has been.  No change in mood or function.  Headaches continue to effect him occasionally.

## 2012-04-26 ENCOUNTER — Telehealth: Payer: Self-pay | Admitting: *Deleted

## 2012-04-26 NOTE — Telephone Encounter (Signed)
PA required for Victoza. Dr. Mikel Cella completed form and was submitted to Medicaid.  Approval was received. Pharmacy notified.

## 2012-04-30 ENCOUNTER — Ambulatory Visit (INDEPENDENT_AMBULATORY_CARE_PROVIDER_SITE_OTHER): Payer: BC Managed Care – PPO | Admitting: Family Medicine

## 2012-04-30 ENCOUNTER — Encounter: Payer: Self-pay | Admitting: Family Medicine

## 2012-04-30 VITALS — BP 132/68 | HR 74 | Temp 98.9°F | Ht 67.6 in | Wt 182.0 lb

## 2012-04-30 DIAGNOSIS — E1142 Type 2 diabetes mellitus with diabetic polyneuropathy: Secondary | ICD-10-CM

## 2012-04-30 DIAGNOSIS — G609 Hereditary and idiopathic neuropathy, unspecified: Secondary | ICD-10-CM

## 2012-04-30 DIAGNOSIS — E1149 Type 2 diabetes mellitus with other diabetic neurological complication: Secondary | ICD-10-CM

## 2012-04-30 DIAGNOSIS — Z23 Encounter for immunization: Secondary | ICD-10-CM

## 2012-04-30 DIAGNOSIS — E1140 Type 2 diabetes mellitus with diabetic neuropathy, unspecified: Secondary | ICD-10-CM

## 2012-04-30 NOTE — Patient Instructions (Addendum)
It was so nice to meet you today. Thanks for coming in today.  I am not making any medication changes today. Please keep your appointments with Dr. Raymondo Band and Dr. Pascal Lux. I will see you back in about 3 months for an A1C check as well as other lab work.  Please let me know if you need anything else!  Take care! Denissa Cozart M. Tiyon Sanor, M.D.

## 2012-04-30 NOTE — Assessment & Plan Note (Signed)
Discussed with patient that his neuropathy is a progressive disease. He does feel like the Nortriptyline is working for him. Continue this for now as well as Lyrica. Continuing to have good blood sugar control is also helpful. Patient will need to continue to monitor his feet. Other options for further testing would be B12 and Folate, but for now, we will continue current regimen.

## 2012-04-30 NOTE — Assessment & Plan Note (Signed)
Finishing up Byetta and switching to Victoza soon. Patient will continue monitoring frequent CBG. Control lows with glucagon, although he has not required that recently. He will follow up with Dr. Raymondo Band after starting the Victoza. His A1C was last done 5 months ago, therefore we will need to recheck at his next visit. He got his flu shot today.

## 2012-04-30 NOTE — Progress Notes (Signed)
Subjective:     Patient ID: Patrick Elliott, male   DOB: February 15, 1961, 51 y.o.   MRN: [id number]  HPI Pt is a 51 yo M presenting to clinic for a follow-up visit today. He has no concerns today. He is currently being followed by Dr. Raymondo Band for his DM, and Drs. Kathrynn Running and [place] for his mood disorders. He would like to talk about his neuropathy today.  1. Neuropathy- Progressively worse. Mostly in the tips of his fingers and feet. Changed from Amitriptyline to Nortriptyline recently which he thinks it better. Lyrica also helps. Worse in morning. Checks feet daily, not noticed any sores or ulcers. He did drop a canned drink on his foot a few days ago which he did not feel, but he has not noticed any bruising, swelling or redness of that area.   2. DM- Managed by Dr. Raymondo Band. Currently he is finishing Byetta and switching to Victoza soon. Checks his blood sugar 8 times per day. Ranges from 80-175. Continues to follow up with Dr. Raymondo Band. Byetta made him feel low, even if he was not. He does have a glucagon kit with him in case he goes too low. No tunnel vision, seeing spots, fatigue or diaphoresis. Last A1C 6.8, will need recheck soon.  3. Health maintenance- Would like flu shot today.  History reviewed: Nonsmoker (former smoker, quit 5 years ago)  Review of Systems See HPI above    Objective:   Physical Exam General: Awake, alert. NAD Heart: RRR Lungs: CTAB Abd: Soft, nontender Ext: scab on left shin. Extremities are warm with 2+ pulses. Decreased sensation of soles of feet and toes, but feels pressure. No lesions or calluses  Neuro: Grossly intact.    Assessment:     51 yo M with DM,  Bipolar, HTN presenting for follow up appointment.    Plan:     See Problem List

## 2012-05-07 ENCOUNTER — Other Ambulatory Visit: Payer: Self-pay | Admitting: Family Medicine

## 2012-05-08 ENCOUNTER — Other Ambulatory Visit: Payer: Self-pay | Admitting: Family Medicine

## 2012-05-15 ENCOUNTER — Other Ambulatory Visit: Payer: Self-pay | Admitting: Family Medicine

## 2012-05-16 ENCOUNTER — Other Ambulatory Visit: Payer: Self-pay | Admitting: Family Medicine

## 2012-05-18 ENCOUNTER — Other Ambulatory Visit: Payer: Self-pay | Admitting: Family Medicine

## 2012-05-21 ENCOUNTER — Other Ambulatory Visit: Payer: Self-pay | Admitting: Family Medicine

## 2012-05-21 ENCOUNTER — Other Ambulatory Visit: Payer: Self-pay | Admitting: Sports Medicine

## 2012-05-23 ENCOUNTER — Other Ambulatory Visit: Payer: Self-pay | Admitting: Family Medicine

## 2012-05-23 ENCOUNTER — Other Ambulatory Visit: Payer: Self-pay | Admitting: Sports Medicine

## 2012-05-25 ENCOUNTER — Other Ambulatory Visit: Payer: Self-pay | Admitting: *Deleted

## 2012-05-25 ENCOUNTER — Other Ambulatory Visit: Payer: Self-pay | Admitting: Family Medicine

## 2012-05-25 ENCOUNTER — Other Ambulatory Visit: Payer: Self-pay | Admitting: Sports Medicine

## 2012-05-25 MED ORDER — NEBIVOLOL HCL 5 MG PO TABS: 5.0000 mg | ORAL_TABLET | Freq: Every day | ORAL | Status: DC

## 2012-05-25 NOTE — Telephone Encounter (Signed)
Fax Received. Refill Completed. Natayla Cadenhead Chowoe (R.M.A)   

## 2012-05-28 ENCOUNTER — Other Ambulatory Visit: Payer: Self-pay | Admitting: Sports Medicine

## 2012-05-28 ENCOUNTER — Other Ambulatory Visit: Payer: Self-pay | Admitting: *Deleted

## 2012-05-28 ENCOUNTER — Other Ambulatory Visit: Payer: Self-pay | Admitting: Family Medicine

## 2012-05-28 NOTE — Telephone Encounter (Signed)
Opened in Error.

## 2012-05-30 ENCOUNTER — Other Ambulatory Visit: Payer: Self-pay | Admitting: *Deleted

## 2012-05-30 MED ORDER — CARVEDILOL 3.125 MG PO TABS: 3.1250 mg | ORAL_TABLET | Freq: Two times a day (BID) | ORAL | Status: DC

## 2012-05-30 NOTE — Telephone Encounter (Signed)
Insurance needs failure or reason to cover bystolic, no prior use of beta blocker, Dr Elease Hashimoto changed treatment plan. Pt was informed and will get new medication, taught pt to take one tablet twice a day 12 hours apart. Pt agreed to plan. Told pt he needs f./u prior to refill but declined to set app at this time, I put note on script that he needs ov prior to refill, pt is aware.

## 2012-05-31 ENCOUNTER — Other Ambulatory Visit: Payer: Self-pay | Admitting: Sports Medicine

## 2012-05-31 ENCOUNTER — Other Ambulatory Visit: Payer: Self-pay | Admitting: *Deleted

## 2012-05-31 ENCOUNTER — Other Ambulatory Visit: Payer: Self-pay | Admitting: Family Medicine

## 2012-05-31 MED ORDER — NEBIVOLOL HCL 5 MG PO TABS: 5.0000 mg | ORAL_TABLET | Freq: Every day | ORAL | Status: DC

## 2012-05-31 NOTE — Telephone Encounter (Signed)
Fax Received. Refill Completed. Patrick Elliott (R.M.A). Pt states he is still taking his Bystolic.

## 2012-06-01 ENCOUNTER — Other Ambulatory Visit: Payer: Self-pay | Admitting: *Deleted

## 2012-06-01 NOTE — Telephone Encounter (Signed)
Fax Received. Refill Completed. Patrick Elliott (R.M.A)   

## 2012-06-02 ENCOUNTER — Other Ambulatory Visit: Payer: Self-pay | Admitting: Sports Medicine

## 2012-06-02 ENCOUNTER — Other Ambulatory Visit: Payer: Self-pay | Admitting: Family Medicine

## 2012-06-05 ENCOUNTER — Other Ambulatory Visit: Payer: Self-pay | Admitting: Family Medicine

## 2012-06-06 ENCOUNTER — Other Ambulatory Visit: Payer: Self-pay | Admitting: *Deleted

## 2012-06-06 NOTE — Telephone Encounter (Signed)
Opened in Error.

## 2012-06-07 ENCOUNTER — Other Ambulatory Visit: Payer: Self-pay | Admitting: Family Medicine

## 2012-06-07 ENCOUNTER — Other Ambulatory Visit: Payer: Self-pay | Admitting: Sports Medicine

## 2012-06-07 ENCOUNTER — Other Ambulatory Visit: Payer: Self-pay | Admitting: *Deleted

## 2012-06-07 NOTE — Telephone Encounter (Signed)
Opened in Error.

## 2012-06-08 ENCOUNTER — Other Ambulatory Visit: Payer: Self-pay | Admitting: Family Medicine

## 2012-06-08 ENCOUNTER — Other Ambulatory Visit: Payer: Self-pay | Admitting: Sports Medicine

## 2012-06-11 ENCOUNTER — Other Ambulatory Visit: Payer: Self-pay | Admitting: Family Medicine

## 2012-06-11 ENCOUNTER — Other Ambulatory Visit: Payer: Self-pay | Admitting: Sports Medicine

## 2012-06-12 ENCOUNTER — Other Ambulatory Visit: Payer: Self-pay | Admitting: *Deleted

## 2012-06-12 MED ORDER — NEBIVOLOL HCL 5 MG PO TABS: 5.0000 mg | ORAL_TABLET | Freq: Every day | ORAL | Status: DC

## 2012-06-14 ENCOUNTER — Other Ambulatory Visit: Payer: Self-pay | Admitting: *Deleted

## 2012-06-14 NOTE — Telephone Encounter (Signed)
Opened in Error.

## 2012-06-17 ENCOUNTER — Other Ambulatory Visit: Payer: Self-pay | Admitting: Family Medicine

## 2012-06-17 ENCOUNTER — Other Ambulatory Visit: Payer: Self-pay | Admitting: Sports Medicine

## 2012-06-19 ENCOUNTER — Other Ambulatory Visit: Payer: Self-pay | Admitting: *Deleted

## 2012-06-19 ENCOUNTER — Ambulatory Visit (INDEPENDENT_AMBULATORY_CARE_PROVIDER_SITE_OTHER): Payer: BC Managed Care – PPO | Admitting: Pharmacist

## 2012-06-19 ENCOUNTER — Encounter: Payer: Self-pay | Admitting: Pharmacist

## 2012-06-19 VITALS — BP 145/80 | HR 85 | Ht 68.4 in | Wt 186.0 lb

## 2012-06-19 DIAGNOSIS — E1142 Type 2 diabetes mellitus with diabetic polyneuropathy: Secondary | ICD-10-CM

## 2012-06-19 DIAGNOSIS — E1149 Type 2 diabetes mellitus with other diabetic neurological complication: Secondary | ICD-10-CM

## 2012-06-19 DIAGNOSIS — E1140 Type 2 diabetes mellitus with diabetic neuropathy, unspecified: Secondary | ICD-10-CM

## 2012-06-19 DIAGNOSIS — I10 Essential (primary) hypertension: Secondary | ICD-10-CM

## 2012-06-19 MED ORDER — CARVEDILOL 3.125 MG PO TABS: 3.1250 mg | ORAL_TABLET | Freq: Two times a day (BID) | ORAL | Status: DC

## 2012-06-19 NOTE — Assessment & Plan Note (Signed)
Patient with history of pancreatic failure, and near death ICU stay now with diabetes currently under excellent control of blood glucose based on   Lab Results Component Value Date  HGBA1C 6.8 11/17/2011   ,home fasting CBG readings of around 100 on low doses of sliding scale Humalog and Victoza. Less hypoglycemic events noted (a couple episodes/month likely due to becoming busy and not eating).  Able to verbalize appropriate hypoglycemia management plan. Continued current plan with Humalog and Victoza.Next A1c and follow up visit with PCP in 6 weeks.  Total time in face to face counseling 45 minutes.  Patient seen with Tiney Rouge, PharmD Candidate and Lillia Pauls, PharmD, Pharmacy Resident.

## 2012-06-19 NOTE — Progress Notes (Signed)
Patient ID: Patrick Elliott, male   DOB: 06/02/1961, 51 y.o.   MRN: [id number] Reviewed and agree with Dr. Koval's documentation and management. 

## 2012-06-19 NOTE — Progress Notes (Signed)
  Subjective:    Patient ID: Patrick Elliott, male    DOB: 1960/09/14, 51 y.o.   MRN: [id number]  HPI Patient arrives today in fair spirits. He states that he is tolerating Victoza at the 1.2 mg dose. He is still using insulin, but is using less. He notes that he may go some weeks with no insulin. His highest dose in the past week was 5 units. Patient states he had some low blood glucoses on days without insulin which may be due to not eating. He notes he has low values a couple of times a month.  Patient states that his neuropathy/pain is 6/10. He is still experiencing numbness in his hands and feet. Patient was recently changed from amitriptyline to nortriptyline because of complaints that amitriptyline had a hang over effect. Patient states that nortriptyline has less of this effect.  Review of Systems     Objective:   Physical Exam        Assessment & Plan:   Patient with history of pancreatic failure, and near death ICU stay now with diabetes currently under excellent control of blood glucose based on   Lab Results  Component Value Date   HGBA1C 6.8 11/17/2011    ,home fasting CBG readings of around 100 on low doses of sliding scale Humalog and Victoza. Less hypoglycemic events noted (a couple episodes/month likely due to becoming busy and not eating).  Able to verbalize appropriate hypoglycemia management plan. Continued current plan with Humalog and Victoza. Patient notified that insurance is no longer going to cover BP medication, Bystolic. Electronic prescription for carvedilol 3.125 mg bid sent to [facility]. Written patient instructions provided.   Next A1c and follow up visit with PCP in 6 weeks.  Total time in face to face counseling 45 minutes.  Patient seen with Tiney Rouge, PharmD Candidate and Lillia Pauls, PharmD, Pharmacy Resident.

## 2012-06-19 NOTE — Assessment & Plan Note (Signed)
Patient notified that insurance is no longer going to cover BP medication, Bystolic. Electronic prescription for carvedilol 3.125 mg bid sent to [facility]. Written patient instructions provided. Reevaluate BP and consider further dose adjustment at next visit.  Follow up visit with PCP in 6 weeks.  Total time in face to face counseling 45 minutes.  Patient seen with Tiney Rouge, PharmD Candidate and Lillia Pauls, PharmD, Pharmacy Resident.

## 2012-06-19 NOTE — Telephone Encounter (Signed)
Opened in Error.

## 2012-06-19 NOTE — Patient Instructions (Addendum)
Thank you for coming in for your visit today.  Continue your current plan with your Humalog and Victoza.  For management of your blood pressure, stop taking your Bystolic and begin taking carvedilol 3.125 mg twice daily.  Follow up with Dr. Mikel Cella in 6 weeks.

## 2012-06-28 ENCOUNTER — Other Ambulatory Visit: Payer: Self-pay | Admitting: Sports Medicine

## 2012-07-01 ENCOUNTER — Other Ambulatory Visit: Payer: Self-pay | Admitting: Family Medicine

## 2012-07-03 ENCOUNTER — Other Ambulatory Visit: Payer: Self-pay | Admitting: *Deleted

## 2012-07-03 NOTE — Telephone Encounter (Signed)
06-19-2012 Patient PCP took him off his Bystolic. Pharmacy is aware. Called patient and lm to inform him we are unable to fill his Bystolic due to this recent change. Call Back number provided.

## 2012-07-06 ENCOUNTER — Other Ambulatory Visit: Payer: Self-pay | Admitting: Sports Medicine

## 2012-07-11 ENCOUNTER — Other Ambulatory Visit: Payer: Self-pay | Admitting: Family Medicine

## 2012-07-12 ENCOUNTER — Other Ambulatory Visit: Payer: Self-pay | Admitting: Sports Medicine

## 2012-07-12 ENCOUNTER — Other Ambulatory Visit: Payer: Self-pay | Admitting: Family Medicine

## 2012-07-14 ENCOUNTER — Other Ambulatory Visit: Payer: Self-pay | Admitting: Sports Medicine

## 2012-07-14 ENCOUNTER — Other Ambulatory Visit: Payer: Self-pay | Admitting: Family Medicine

## 2012-07-22 ENCOUNTER — Other Ambulatory Visit: Payer: Self-pay | Admitting: Family Medicine

## 2012-07-22 ENCOUNTER — Other Ambulatory Visit: Payer: Self-pay | Admitting: Sports Medicine

## 2012-07-24 ENCOUNTER — Other Ambulatory Visit: Payer: Self-pay | Admitting: *Deleted

## 2012-07-24 ENCOUNTER — Other Ambulatory Visit: Payer: Self-pay | Admitting: Family Medicine

## 2012-07-24 ENCOUNTER — Other Ambulatory Visit: Payer: Self-pay | Admitting: Sports Medicine

## 2012-07-24 MED ORDER — NEBIVOLOL HCL 5 MG PO TABS: 5.0000 mg | ORAL_TABLET | Freq: Every day | ORAL | Status: DC

## 2012-07-24 NOTE — Telephone Encounter (Signed)
Pt needs appointment then refill can be made Fax Received. Refill Completed. Steele Ledonne Chowoe (R.M.A)   

## 2012-07-29 ENCOUNTER — Other Ambulatory Visit: Payer: Self-pay | Admitting: Family Medicine

## 2012-08-01 ENCOUNTER — Other Ambulatory Visit: Payer: Self-pay | Admitting: Family Medicine

## 2012-08-01 DIAGNOSIS — G609 Hereditary and idiopathic neuropathy, unspecified: Secondary | ICD-10-CM

## 2012-08-01 MED ORDER — PREGABALIN 100 MG PO CAPS: 100.0000 mg | ORAL_CAPSULE | Freq: Three times a day (TID) | ORAL | Status: DC

## 2012-08-02 ENCOUNTER — Other Ambulatory Visit: Payer: Self-pay | Admitting: *Deleted

## 2012-08-06 ENCOUNTER — Other Ambulatory Visit: Payer: Self-pay | Admitting: Sports Medicine

## 2012-08-06 ENCOUNTER — Other Ambulatory Visit: Payer: Self-pay | Admitting: Family Medicine

## 2012-08-10 ENCOUNTER — Encounter: Payer: Self-pay | Admitting: Family Medicine

## 2012-08-10 ENCOUNTER — Ambulatory Visit (INDEPENDENT_AMBULATORY_CARE_PROVIDER_SITE_OTHER): Payer: BC Managed Care – PPO | Admitting: Family Medicine

## 2012-08-10 VITALS — BP 148/84 | HR 80 | Wt 186.0 lb

## 2012-08-10 DIAGNOSIS — E1140 Type 2 diabetes mellitus with diabetic neuropathy, unspecified: Secondary | ICD-10-CM

## 2012-08-10 DIAGNOSIS — I10 Essential (primary) hypertension: Secondary | ICD-10-CM

## 2012-08-10 DIAGNOSIS — E1149 Type 2 diabetes mellitus with other diabetic neurological complication: Secondary | ICD-10-CM

## 2012-08-10 DIAGNOSIS — E1142 Type 2 diabetes mellitus with diabetic polyneuropathy: Secondary | ICD-10-CM

## 2012-08-10 DIAGNOSIS — E785 Hyperlipidemia, unspecified: Secondary | ICD-10-CM

## 2012-08-10 LAB — POCT GLYCOSYLATED HEMOGLOBIN (HGB A1C): Hemoglobin A1C: 6.2

## 2012-08-10 MED ORDER — CARVEDILOL 6.25 MG PO TABS: 6.2500 mg | ORAL_TABLET | Freq: Two times a day (BID) | ORAL | Status: DC

## 2012-08-10 NOTE — Assessment & Plan Note (Signed)
A1C at goal on Victoza and humolog, continue at current dose, f/u in 3 months.

## 2012-08-10 NOTE — Patient Instructions (Signed)
Your A1C today is 6.2, please continue your diabetes medications at the current dose.  Your blood pressure today was BP: 148/84 mmHg.  Remember your goal blood pressure is about 130/80.  Please be sure to take your medication every day.  I am increasing your carvedilol to 6.25 mg twice a day for better control, please check your blood pressures a few times a week to see how it is doing.  Please keep your appointment with Dr. Raymondo Band, and follow up with Dr. Mikel Cella in about 3 months.

## 2012-08-10 NOTE — Assessment & Plan Note (Signed)
Last lipid profile it was well controlled, continue diet changes, plan on lipids in 3 months.

## 2012-08-10 NOTE — Progress Notes (Signed)
  Subjective:    Patient ID: Patrick Elliott, male    DOB: 06-16-1961, 51 y.o.   MRN: [id number]  HPI  DM: Patient is taking victoza and humolog SSI as needed.  Patient is checking blood sugars.  Fasting sugars range from 90 to 120.  No hyper or hypoglycemic episodes, no polyuria or polydypisa  HTN: Taking Coreg and Lisinopril without difficulty.  Denies chest pain, dizziness, palpitations, LE edema.  Patient is not check blood pressures.  He did used to take bystolic but that was changed for insurance reasons to coreg.   HLD: Patient is taking zocor without difficulty.  Last lipid profile was March 2013.  Patient is is making lifestyle modifications, trying to watch diet.   I have reviewed the patient's medical history in detail and updated the computerized patient record.  Review of Systems    Pertinent items in HPI Objective:   Physical Exam BP 148/84  Pulse 80  Wt 186 lb (84.369 kg) General appearance: alert, cooperative and no distress Lungs: clear to auscultation bilaterally Heart: regular rate and rhythm, S1, S2 normal, no murmur, click, rub or gallop Extremities: extremities normal, atraumatic, no cyanosis or edema Pulses: 2+ and symmetric       Assessment & Plan:

## 2012-08-10 NOTE — Assessment & Plan Note (Signed)
Not at goal, will increase Coreg, patient agrees to check BP's to see how it runs at home.  F/U in 3 months.

## 2012-08-20 ENCOUNTER — Other Ambulatory Visit: Payer: Self-pay | Admitting: Family Medicine

## 2012-08-24 ENCOUNTER — Ambulatory Visit: Payer: BC Managed Care – PPO | Admitting: Family Medicine

## 2012-08-29 ENCOUNTER — Other Ambulatory Visit: Payer: Self-pay | Admitting: Family Medicine

## 2012-09-03 ENCOUNTER — Other Ambulatory Visit: Payer: Self-pay | Admitting: *Deleted

## 2012-09-03 ENCOUNTER — Other Ambulatory Visit: Payer: Self-pay | Admitting: Family Medicine

## 2012-09-03 ENCOUNTER — Other Ambulatory Visit: Payer: Self-pay | Admitting: Sports Medicine

## 2012-09-03 DIAGNOSIS — I10 Essential (primary) hypertension: Secondary | ICD-10-CM

## 2012-09-03 MED ORDER — CARVEDILOL 6.25 MG PO TABS: 6.2500 mg | ORAL_TABLET | Freq: Two times a day (BID) | ORAL | Status: DC

## 2012-09-03 NOTE — Telephone Encounter (Signed)
Fax Received. Refill Completed. Patrick Elliott (R.M.A)   

## 2012-09-04 ENCOUNTER — Other Ambulatory Visit: Payer: Self-pay | Admitting: Family Medicine

## 2012-09-07 ENCOUNTER — Telehealth: Payer: Self-pay | Admitting: Family Medicine

## 2012-09-07 ENCOUNTER — Other Ambulatory Visit: Payer: Self-pay | Admitting: Family Medicine

## 2012-09-08 ENCOUNTER — Other Ambulatory Visit: Payer: Self-pay | Admitting: Sports Medicine

## 2012-09-10 MED ORDER — GLUCOSE BLOOD VI STRP: ORAL_STRIP | Status: DC

## 2012-09-10 NOTE — Addendum Note (Signed)
Addended by: Damita Lack on: 09/10/2012 11:20 AM   Modules accepted: Orders

## 2012-09-11 ENCOUNTER — Other Ambulatory Visit: Payer: Self-pay | Admitting: *Deleted

## 2012-09-11 NOTE — Telephone Encounter (Signed)
Opened in Error.

## 2012-09-14 ENCOUNTER — Other Ambulatory Visit: Payer: Self-pay | Admitting: Family Medicine

## 2012-09-17 ENCOUNTER — Other Ambulatory Visit: Payer: Self-pay | Admitting: Family Medicine

## 2012-09-18 ENCOUNTER — Ambulatory Visit: Payer: BC Managed Care – PPO | Admitting: Pharmacist

## 2012-09-25 ENCOUNTER — Other Ambulatory Visit: Payer: Self-pay | Admitting: Family Medicine

## 2012-09-26 ENCOUNTER — Other Ambulatory Visit: Payer: Self-pay | Admitting: *Deleted

## 2012-09-26 NOTE — Telephone Encounter (Signed)
Opened in Error.

## 2012-09-30 ENCOUNTER — Other Ambulatory Visit: Payer: Self-pay | Admitting: Family Medicine

## 2012-10-06 ENCOUNTER — Other Ambulatory Visit: Payer: Self-pay | Admitting: Family Medicine

## 2012-10-07 ENCOUNTER — Other Ambulatory Visit: Payer: Self-pay | Admitting: Sports Medicine

## 2012-10-09 ENCOUNTER — Other Ambulatory Visit: Payer: Self-pay | Admitting: Family Medicine

## 2012-10-11 ENCOUNTER — Other Ambulatory Visit: Payer: Self-pay | Admitting: Family Medicine

## 2012-10-12 ENCOUNTER — Other Ambulatory Visit: Payer: Self-pay | Admitting: Family Medicine

## 2012-10-12 NOTE — Telephone Encounter (Signed)
Patient calling because he is out of his med and needs refill of Victoza (liraglutide).   Patient states pharmacy has requested refills, but has not heard back from our office.  Refill request today from pharmacy for Lamictal, nortriptyline, and lithium.  No request for Victoza.   Will add Victoza to refill requests and route to Dr. Mikel Cella.  Gaylene Brooks, RN

## 2012-10-14 ENCOUNTER — Other Ambulatory Visit: Payer: Self-pay | Admitting: Family Medicine

## 2012-10-15 ENCOUNTER — Other Ambulatory Visit: Payer: Self-pay | Admitting: Family Medicine

## 2012-10-15 ENCOUNTER — Telehealth: Payer: Self-pay | Admitting: Family Medicine

## 2012-10-15 NOTE — Telephone Encounter (Signed)
Left message for Mr. Berberian to check with his pharmacy because the Victoza refill has been sent to his pharmacy.  Ileana Ladd

## 2012-10-15 NOTE — Telephone Encounter (Signed)
Pt states that the pharmacy has sent request for Victoza since last week and he is now out.  Needs to know what to do ,.

## 2012-10-16 ENCOUNTER — Other Ambulatory Visit: Payer: Self-pay | Admitting: Family Medicine

## 2012-10-17 ENCOUNTER — Other Ambulatory Visit: Payer: Self-pay | Admitting: Family Medicine

## 2012-10-17 DIAGNOSIS — F988 Other specified behavioral and emotional disorders with onset usually occurring in childhood and adolescence: Secondary | ICD-10-CM

## 2012-10-17 DIAGNOSIS — F319 Bipolar disorder, unspecified: Secondary | ICD-10-CM

## 2012-10-17 MED ORDER — ZOLPIDEM TARTRATE 10 MG PO TABS: 10.0000 mg | ORAL_TABLET | Freq: Every evening | ORAL | Status: DC | PRN

## 2012-10-17 MED ORDER — LISDEXAMFETAMINE DIMESYLATE 70 MG PO CAPS: 70.0000 mg | ORAL_CAPSULE | ORAL | Status: DC

## 2012-10-17 NOTE — Progress Notes (Signed)
Pt has requested refills on Ambien and Vyvance until he can see Dr. Kathrynn Running in [facility] in a few weeks. One month supply printed and available for patient to pick up. He should get additional refills from Dr. Kathrynn Running.  Amber M. [place], M.D.

## 2012-10-18 ENCOUNTER — Encounter: Payer: Self-pay | Admitting: Cardiovascular Disease

## 2012-10-18 ENCOUNTER — Ambulatory Visit (INDEPENDENT_AMBULATORY_CARE_PROVIDER_SITE_OTHER): Payer: BC Managed Care – PPO | Admitting: Cardiovascular Disease

## 2012-10-18 ENCOUNTER — Ambulatory Visit (INDEPENDENT_AMBULATORY_CARE_PROVIDER_SITE_OTHER): Payer: BC Managed Care – PPO | Admitting: Pharmacist

## 2012-10-18 ENCOUNTER — Encounter: Payer: Self-pay | Admitting: Pharmacist

## 2012-10-18 VITALS — BP 138/78 | HR 87 | Ht 69.5 in | Wt 186.5 lb

## 2012-10-18 VITALS — BP 134/86 | HR 75 | Ht 69.5 in | Wt 187.1 lb

## 2012-10-18 DIAGNOSIS — E1140 Type 2 diabetes mellitus with diabetic neuropathy, unspecified: Secondary | ICD-10-CM

## 2012-10-18 NOTE — Progress Notes (Signed)
  Subjective:    Patient ID: Patrick Elliott, male    DOB: 12-28-1960, 52 y.o.   MRN: [id number]  HPI Patient arrives in NO distress.   He complained about the "heat" in the waiting room.     He appears relaxed with interaction of health care team.   He denies ANY episodes of hypoglycemia in recent memory.  His lowest reported CBG was 85-90.   He does NOT recall when his last use of glucagon rescue was administered.  Upon review of his possessions he determined that his glucagon pen expired in 02/2010.   He will continue to carry this pen until he picks up another glucagon rescue pen.   (It is noteworthy that he previously used glucagon ~ 1 time per week several years ago).   He denies any adverse effects of victoza and believes this tx has allowed him to have less variable control.   He reports using only 2-5 units TOTAL insulin units in the last week.  Home CBGs have consistently been 100-180 with majority of readings < 150.     Review of Systems     Objective:   Physical Exam      Assessment & Plan:   Diabetes of 7+ yrs duration currently under excellent control of blood glucose based on   Lab Results  Component Value Date   HGBA1C 6.2 08/10/2012    ,home fasting CBG readings of 100-180 with majority of readings < 150 and minimal hypoglycemic readings.  Control has been optimized with use of GLP-1 (liraglutide)- Vicotoza) and minimal use of rapid acting insulin.  Denies hypoglycemic events.  Able to verbalize appropriate hypoglycemia management plan (provided new Rx for glucagon rescue).   No change to use of insulin at this time.  Advised patient fewer monitoring measurements throughout the day would be appropriate given minimal low readings.  He was apprehensive about a decrease in monitoring BUT stated he would consider less monitoring.  Written patient instructions provided.  Follow up in  Pharmacist Clinic Visit PRN worsening control.  I do NOT feel continuity visits are necessary  with me unless his readings exceed 180 routinely.  I do not anticipate hypoglycemia with current regimen on any routine bases.   Total time in face to face counseling 20 minutes.  Patient seen with Drue Stager PharmD.

## 2012-10-18 NOTE — Assessment & Plan Note (Addendum)
Patrick Elliott seems to be doing well. He seems to be well compensated. He's not had any episodes of chest pain or shortness breath.  We'll continue the same medications.  I will see him again in one year for followup visit. He seems to go to the doctor quite a bit and will we will try to minimize his visits as much as possible.

## 2012-10-18 NOTE — Progress Notes (Signed)
Evelene Croon Date of Birth  04/16/1961 [facility]     [place] [facility]  [address]    [address]   [address] [place], [place]  [address]    [place], [place]  [address] [phone]  Fax  [phone]  [phone]  Fax [phone]  Problem list: 1. Hypertension 2. Diastolic congestive heart failure 3. Diabetes mellitus 4. Anemia 5.  Cirrhosis of the liver 7. Thrombocytopenia   History of Present Illness:   Shafiq presents with no new cardiac complaints.  He was recently seen by Hematology for thrombocytopenia.   He describes some symptoms of orthostatic hypotension ( tunnel vision when getting up from a bent over position.   Current Outpatient Prescriptions on File Prior to Visit  Medication Sig Dispense Refill  . BD PEN NEEDLE NANO U/F 32G X 4 MM MISC USE AS DIRECTED TWICE A DAY WITH BYETTA  100 each  2  . carvedilol (COREG) 6.25 MG tablet Take 1 tablet (6.25 mg total) by mouth 2 (two) times daily with a meal.  60 tablet  3  . Glucagon, rDNA, (GLUCAGON EMERGENCY IJ) Use for sugars below 40       . glucose blood (ACCU-CHEK AVIVA PLUS) test strip Check blood sugars three times a day with meals  100 each  11  . HUMALOG 100 UNIT/ML injection INJECT 2-5 UNITS INTO THE SKIN 3 TIMES A DAY BEFORE MEALS  10 mL  3  . Insulin Pen Needle (PEN NEEDLES) 31G X 6 MM MISC Please provide pen needles for Byetta pen .  100 each  2  . lamoTRIgine (LAMICTAL) 25 MG tablet TAKE 2 TABLETS BY MOUTH TWICE A DAY  120 tablet  0  . Lancets (ACCU-CHEK MULTICLIX) lancets USE AS DIRECTED  102 each  3  . lisdexamfetamine (VYVANSE) 70 MG capsule Take 1 capsule (70 mg total) by mouth every morning.  30 capsule  0  . lisinopril (PRINIVIL,ZESTRIL) 20 MG tablet Take 1 tablet (20 mg total) by mouth daily.  30 tablet  6  . lithium carbonate (ESKALITH) 450 MG CR tablet TAKE 1 TABLET BY MOUTH EVERY DAY WITH FOOD  30 tablet  3  . Multiple Vitamin (MULTIVITAMIN) capsule Take 1 capsule by mouth  daily.        . nortriptyline (PAMELOR) 25 MG capsule TAKE 1 CAPSULE EVERY MORNING & 1 CAPSULE AT LUNCH AND 2 CAPSULE EVERY EVENING AT BEDTIME  120 capsule  2  . omeprazole (PRILOSEC) 20 MG capsule TAKE ONE CAPSULE BY MOUTH DAILY  30 capsule  11  . pregabalin (LYRICA) 100 MG capsule Take 1 capsule (100 mg total) by mouth 3 (three) times daily.  90 capsule  11  . simvastatin (ZOCOR) 20 MG tablet Take 1 tablet (20 mg total) by mouth daily.  30 tablet  6  . SUMAtriptan (IMITREX) 50 MG tablet Take 1 tablet (50 mg total) by mouth every 2 (two) hours as needed for migraine.  18 tablet  6  . tadalafil (CIALIS) 20 MG tablet Take 5 mg by mouth as needed.        . triamcinolone ointment (KENALOG) 0.1 %       . VICTOZA 18 MG/3ML SOLN injection INJECT 0.6 MG DAILY FOR 7 DAYS THEN INCREASE TO 1.2 MGS DAILY AS DIRECTED  18 mL  5  . zolpidem (AMBIEN) 10 MG tablet Take 1 tablet (10 mg total) by mouth at bedtime as needed.  30 tablet  0  No current facility-administered medications on file prior to visit.    Allergies  Allergen Reactions  . Sulfamethoxazole Rash    Past Medical History  Diagnosis Date  . Hypertension   . Diabetes mellitus   . Anemia   . Cirrhosis   . CHF (congestive heart failure)     DIASTOLIC  . Tachycardia   . Dizziness   . Syncope and collapse     Past Surgical History  Procedure Laterality Date  . Knee surgery      History  Smoking status  . Former Smoker  . Quit date: 04/22/2006  Smokeless tobacco  . Never Used    History  Alcohol Use No    Family History  Problem Relation Age of Onset  . Hypertension Father     Reviw of Systems:  Reviewed in the HPI.  All other systems are negative.  Physical Exam: Blood pressure 134/86, pulse 75, height 5' 9.5" (1.765 m), weight 187 lb 1.9 oz (84.877 kg). General: Well developed, well nourished, in no acute distress.  Head: Normocephalic, atraumatic, sclera non-icteric, mucus membranes are moist,   Neck: Supple.  Carotids are 2 + without bruits. No JVD  Lungs: Clear bilaterally to auscultation.  Heart: regular rate  With normal  S1 S2. No murmurs, gallops or rubs.  Abdomen: Soft, non-tender, non-distended with normal bowel sounds. No hepatomegaly. No rebound/guarding. No masses.  Msk:  Strength and tone are normal  Extremities: No clubbing or cyanosis. No edema.  Distal pedal pulses are 2+ and equal bilaterally.  Neuro: Alert and oriented X 3. Moves all extremities spontaneously.  Psych:  Responds to questions appropriately with a normal affect.  ECG: 10/18/12:  Normal sinus rhythm  At 75 with first degree AV block.  Assessment / Plan:

## 2012-10-18 NOTE — Patient Instructions (Addendum)
It was nice seeing you today  You are doing well and can continue all your medications If you would like to decrease how often you check your blood sugar to as low as 2 times a day you can

## 2012-10-18 NOTE — Patient Instructions (Addendum)
Your physician wants you to follow-up in: 1 year  You will receive a reminder letter in the mail two months in advance. If you don't receive a letter, please call our office to schedule the follow-up appointment.  Your physician recommends that you continue on your current medications as directed. Please refer to the Current Medication list given to you today.  

## 2012-10-19 ENCOUNTER — Other Ambulatory Visit: Payer: Self-pay | Admitting: *Deleted

## 2012-10-19 ENCOUNTER — Encounter: Payer: Self-pay | Admitting: Pharmacist

## 2012-10-19 MED ORDER — CARVEDILOL 6.25 MG PO TABS: 6.2500 mg | ORAL_TABLET | Freq: Two times a day (BID) | ORAL | Status: DC

## 2012-10-19 MED ORDER — GLUCAGON (RDNA) 1 MG IJ KIT: 1.0000 mg | PACK | Freq: Once | INTRAMUSCULAR | Status: DC | PRN

## 2012-10-19 NOTE — Assessment & Plan Note (Signed)
Diabetes of 7+ yrs duration currently under excellent control of blood glucose based on   Lab Results  Component Value Date   HGBA1C 6.2 08/10/2012    ,home fasting CBG readings of 100-180 with majority of readings < 150 and minimal hypoglycemic readings.  Control has been optimized with use of GLP-1 (liraglutide)- Vicotoza) and minimal use of rapid acting insulin.  Denies hypoglycemic events.  Able to verbalize appropriate hypoglycemia management plan (provided new Rx for glucagon rescue).   No change to use of insulin at this time.  Advised patient fewer monitoring measurements throughout the day would be appropriate given minimal low readings.  He was apprehensive about a decrease in monitoring BUT stated he would consider less monitoring.  Written patient instructions provided.  Follow up in  Pharmacist Clinic Visit PRN worsening control.  I do NOT feel continuity visits are necessary with me unless his readings exceed 180 routinely.  I do not anticipate hypoglycemia with current regimen on any routine bases.   Total time in face to face counseling 20 minutes.  Patient seen with Drue Stager PharmD.

## 2012-10-19 NOTE — Progress Notes (Signed)
Patient ID: Patrick Elliott, male   DOB: 01-20-1961, 52 y.o.   MRN: [id number] Reviewed: Agree with Dr. Macky Lower documentation and management.

## 2012-10-23 ENCOUNTER — Other Ambulatory Visit: Payer: Self-pay | Admitting: Family Medicine

## 2012-10-23 ENCOUNTER — Other Ambulatory Visit: Payer: Self-pay | Admitting: Sports Medicine

## 2012-10-24 ENCOUNTER — Telehealth: Payer: Self-pay | Admitting: Psychology

## 2012-10-24 NOTE — Telephone Encounter (Signed)
Patrick Elliott called to schedule a follow-up [facility] appointment.  First available isn't until 4/2.  Scheduled for then.  Needs refills on Vyvanse 70 mg and Ambien 10 mg to cover him until that appointment.  Dr. Kathrynn Running wrote the scripts 10/24/12.  Six months worth of Ambien and two months of Vyvanse.

## 2012-10-25 ENCOUNTER — Encounter: Payer: Self-pay | Admitting: Psychology

## 2012-10-26 ENCOUNTER — Encounter: Payer: Self-pay | Admitting: Family Medicine

## 2012-10-26 ENCOUNTER — Encounter: Payer: BC Managed Care – PPO | Admitting: Family Medicine

## 2012-10-29 ENCOUNTER — Other Ambulatory Visit: Payer: Self-pay | Admitting: *Deleted

## 2012-10-29 DIAGNOSIS — I10 Essential (primary) hypertension: Secondary | ICD-10-CM

## 2012-10-29 MED ORDER — CARVEDILOL 6.25 MG PO TABS: 6.2500 mg | ORAL_TABLET | Freq: Two times a day (BID) | ORAL | Status: DC

## 2012-10-29 NOTE — Telephone Encounter (Signed)
Fax Received. Refill Completed. Octavia Chowoe (R.M.A)   

## 2012-11-01 ENCOUNTER — Other Ambulatory Visit: Payer: Self-pay | Admitting: Family Medicine

## 2012-11-02 ENCOUNTER — Encounter: Payer: Self-pay | Admitting: Cardiovascular Disease

## 2012-11-07 ENCOUNTER — Other Ambulatory Visit: Payer: Self-pay | Admitting: *Deleted

## 2012-11-07 MED ORDER — LISINOPRIL 20 MG PO TABS: 20.0000 mg | ORAL_TABLET | Freq: Every day | ORAL | Status: DC

## 2012-11-07 NOTE — Telephone Encounter (Signed)
Fax Received. Refill Completed. Ranae Casebier Chowoe (R.M.A)   

## 2012-11-10 ENCOUNTER — Other Ambulatory Visit: Payer: Self-pay | Admitting: Family Medicine

## 2012-11-10 ENCOUNTER — Other Ambulatory Visit: Payer: Self-pay | Admitting: Sports Medicine

## 2012-11-13 ENCOUNTER — Encounter: Payer: Self-pay | Admitting: Psychology

## 2012-11-14 ENCOUNTER — Other Ambulatory Visit: Payer: Self-pay | Admitting: Family Medicine

## 2012-11-15 ENCOUNTER — Encounter: Payer: Self-pay | Admitting: Family Medicine

## 2012-11-15 ENCOUNTER — Ambulatory Visit (INDEPENDENT_AMBULATORY_CARE_PROVIDER_SITE_OTHER): Payer: BC Managed Care – PPO | Admitting: Family Medicine

## 2012-11-15 ENCOUNTER — Other Ambulatory Visit: Payer: Self-pay | Admitting: *Deleted

## 2012-11-15 VITALS — BP 122/72 | HR 77 | Temp 98.2°F | Ht 69.5 in | Wt 182.0 lb

## 2012-11-15 DIAGNOSIS — E1142 Type 2 diabetes mellitus with diabetic polyneuropathy: Secondary | ICD-10-CM

## 2012-11-15 DIAGNOSIS — R259 Unspecified abnormal involuntary movements: Secondary | ICD-10-CM

## 2012-11-15 DIAGNOSIS — E1149 Type 2 diabetes mellitus with other diabetic neurological complication: Secondary | ICD-10-CM

## 2012-11-15 DIAGNOSIS — R253 Fasciculation: Secondary | ICD-10-CM

## 2012-11-15 DIAGNOSIS — G609 Hereditary and idiopathic neuropathy, unspecified: Secondary | ICD-10-CM

## 2012-11-15 DIAGNOSIS — E785 Hyperlipidemia, unspecified: Secondary | ICD-10-CM

## 2012-11-15 DIAGNOSIS — I10 Essential (primary) hypertension: Secondary | ICD-10-CM

## 2012-11-15 DIAGNOSIS — E1140 Type 2 diabetes mellitus with diabetic neuropathy, unspecified: Secondary | ICD-10-CM

## 2012-11-15 LAB — CBC
MCHC: 35.3 g/dL (ref 30.0–36.0)
RDW: 14.4 % (ref 11.5–15.5)

## 2012-11-15 LAB — POCT GLYCOSYLATED HEMOGLOBIN (HGB A1C): Hemoglobin A1C: 5.9

## 2012-11-15 MED ORDER — GLUCOSE BLOOD VI STRP: ORAL_STRIP | Status: DC

## 2012-11-15 MED ORDER — INSULIN PEN NEEDLE 32G X 4 MM MISC: Status: DC

## 2012-11-15 MED ORDER — NORTRIPTYLINE HCL 25 MG PO CAPS: ORAL_CAPSULE | ORAL | Status: DC

## 2012-11-15 MED ORDER — CARVEDILOL 6.25 MG PO TABS: 6.2500 mg | ORAL_TABLET | Freq: Two times a day (BID) | ORAL | Status: DC

## 2012-11-15 MED ORDER — LISINOPRIL 20 MG PO TABS: 20.0000 mg | ORAL_TABLET | Freq: Every day | ORAL | Status: DC

## 2012-11-15 MED ORDER — LIRAGLUTIDE 18 MG/3ML ~~LOC~~ SOLN: SUBCUTANEOUS | Status: DC

## 2012-11-15 MED ORDER — ACCU-CHEK MULTICLIX LANCETS MISC: Status: DC

## 2012-11-15 MED ORDER — SIMVASTATIN 20 MG PO TABS: 20.0000 mg | ORAL_TABLET | Freq: Every day | ORAL | Status: DC

## 2012-11-15 MED ORDER — LITHIUM CARBONATE ER 450 MG PO TBCR: EXTENDED_RELEASE_TABLET | ORAL | Status: DC

## 2012-11-15 MED ORDER — LAMOTRIGINE 25 MG PO TABS: ORAL_TABLET | ORAL | Status: DC

## 2012-11-15 MED ORDER — OMEPRAZOLE 20 MG PO CPDR: DELAYED_RELEASE_CAPSULE | ORAL | Status: DC

## 2012-11-15 MED ORDER — AMITRIPTYLINE HCL 25 MG PO TABS: 25.0000 mg | ORAL_TABLET | Freq: Three times a day (TID) | ORAL | Status: DC

## 2012-11-15 NOTE — Assessment & Plan Note (Signed)
Continue lyrica. Patient states his neuropathy is bad, but not worsening. Also, will check B12 level

## 2012-11-15 NOTE — Progress Notes (Signed)
Patient ID: Patrick Elliott, male   DOB: May 01, 1961, 52 y.o.   MRN: [id number]  [facility] Elon Eoff M. Ezme Duch, MD Phone: [phone]   Subjective: HPI: Patient is a 52 y.o. male presenting to clinic today for follow up appointment. Concerns today include medication refills.   1. Diabetes: Lost lancet holder, needs new one High at home: 180 Low at home: 80 Taking medications: Yes Side effects: Some symptomatic lows but checks blood sugar and it is around 100. Feels better around 110. ROS: denies fever, chills, dizziness, LOC, polyuria, polydipsia, numbness or tingling in extremities or chest pain. Neuropathy is getting worse. Now dropping stuff. Last B12 level was in 2011.  2. Medication refills: Needs refills on all medications.   3. Muscle jerks: Increased twitching after starting Vyvanse but now having intermittent jerks of upper arms and legs. Unsure of cause. No new medications. Overall feels fatigued with less energy.  History Reviewed: Non-smoker. Health Maintenance: UTD except colonoscopy, which I failed to mention today  ROS: Please see HPI above.  Objective: Office vital signs reviewed. BP 122/72  Pulse 77  Temp(Src) 98.2 F (36.8 C) (Oral)  Ht 5' 9.5" (1.765 m)  Wt 182 lb (82.555 kg)  BMI 26.5 kg/m2  Physical Examination:  General: Awake, alert. NAD. Lying down on exam table for majority of encounter HEENT: Atraumatic, normocephalic. Wears glasses. Neck: No masses palpated. No LAD Pulm: CTAB, no wheezes Cardio: RRR, no murmurs appreciated. Good peripheral pulses Abdomen:+BS, soft, nontender, nondistended Extremities: No edema.  Neuro: Grossly intact  Assessment: 52 y.o. male Follow up appointment  Plan: See Problem List and After Visit Summary

## 2012-11-15 NOTE — Assessment & Plan Note (Signed)
Stable on Zocor. Will recheck lipid panel today and make appropriate changes.

## 2012-11-15 NOTE — Assessment & Plan Note (Signed)
BP at goal. Continue current medications. Refills sent to the pharmacy.

## 2012-11-15 NOTE — Assessment & Plan Note (Signed)
I do not have a good explanation for his muscle twitching and jerks. He is on many medications that could cause this, but hard to point to just one. Does not seem to interfere with his daily life at this time. Will check Vit D levels today as well.

## 2012-11-15 NOTE — Assessment & Plan Note (Signed)
A1C <6.0 today. Doing well with the Victoza. He should decrease Humalog as much as he can. He prefers to check his sugar 6 times per day for better control. I have informed him this isn't recommended and he states he "can't live" if he doesn't check it often. Change Rx to indicate 6x/day. Also, given new meter since he has lost his lancet pen. Overall, satisfied with his improvements. Neuropathy still a problem for him, but unchanged. Will check B12 levels today along with other labs.

## 2012-11-15 NOTE — Patient Instructions (Addendum)
It was good to see you!  I will call the pharmacy to make sure we have all of your medications situated. I am not changing anything right now.  Please stop by the lab to do your blood work today.  Your A1C was less than 6 today so you can cut back on your humalog if you want to.  I will see you back in 6 months, or sooner if needed.  Shamona Wirtz M. Elane Peabody, M.D.

## 2012-11-16 LAB — COMPREHENSIVE METABOLIC PANEL
Albumin: 4.5 g/dL (ref 3.5–5.2)
Alkaline Phosphatase: 34 U/L — ABNORMAL LOW (ref 39–117)
BUN: 14 mg/dL (ref 6–23)
CO2: 27 mEq/L (ref 19–32)
Calcium: 9.6 mg/dL (ref 8.4–10.5)
Chloride: 105 mEq/L (ref 96–112)
Glucose, Bld: 93 mg/dL (ref 70–99)
Potassium: 4.2 mEq/L (ref 3.5–5.3)
Sodium: 138 mEq/L (ref 135–145)
Total Protein: 6.5 g/dL (ref 6.0–8.3)

## 2012-11-16 LAB — LIPID PANEL
Cholesterol: 102 mg/dL (ref 0–200)
HDL: 35 mg/dL — ABNORMAL LOW (ref >39)
Triglycerides: 50 mg/dL (ref <150)

## 2012-11-21 ENCOUNTER — Ambulatory Visit (INDEPENDENT_AMBULATORY_CARE_PROVIDER_SITE_OTHER): Payer: BC Managed Care – PPO | Admitting: Psychology

## 2012-11-21 DIAGNOSIS — F988 Other specified behavioral and emotional disorders with onset usually occurring in childhood and adolescence: Secondary | ICD-10-CM

## 2012-11-21 DIAGNOSIS — F319 Bipolar disorder, unspecified: Secondary | ICD-10-CM

## 2012-11-21 NOTE — Progress Notes (Signed)
Patrick Elliott presents for follow-up.  Last seen in September, 2013.  His agenda includes "bad spasms" with shaking, continued difficulty with attention ("can't hold a thought in my head"), losing feelings in his fingertips and "still moody."  Continues with artwork.

## 2012-11-21 NOTE — Assessment & Plan Note (Signed)
He continues to struggle with attention and concentration.  Vyvanse helps but not completely.  After [name] left, I wonder whether we have explored the idea of neuropsychological testing to give Patrick Elliott a sense of his attention / memory from a more standardized perspective.  Will discuss next visit.  Dr. Kathrynn Running wondered whether too much nortriptyline could be playing a role.  See assessment under Bipolar Affective Disorder for further recommendations.    Five months of prescriptions of Vyvanse were written.  Will be due for refills starting 05/23/2013.

## 2012-11-21 NOTE — Patient Instructions (Addendum)
Thanks for coming in today Patrick Elliott.  Please schedule a follow-up for 5/21 at 11:30. Dr. Kathrynn Running reviewed your medication list and thought that making a change in nortriptyline might help you with your attention and possible mood.  You might be getting too much medicine.  He recommended you decrease by one pill daily.  Your total does would then be 75 mg.  Call me if you have questions or concerns.

## 2012-11-21 NOTE — Assessment & Plan Note (Addendum)
Report of symptoms are consistent with earlier reports.  He looks well.  Function remains stable.  Treatment team previously determined that we exhausted our different medication options.  Dr. Kathrynn Running reviewed the med list and wondered if perhaps the nortriptyline might be having a negative effect on attention and mood.  He recommended Patrick Elliott get a level as well as decrease to 75 mg a day from 100 mg.  We will need to determiner whether this increases his pain from peripheral neuropathy in addition to whether it has any positive effects on his reported symptoms.  See patient instructions for further plan.

## 2012-11-24 LAB — NORTRIPTYLINE LEVEL: Nortriptyline Lvl: 158 mcg/L — ABNORMAL HIGH (ref 50–150)

## 2012-11-28 ENCOUNTER — Telehealth: Payer: Self-pay | Admitting: *Deleted

## 2012-11-28 ENCOUNTER — Other Ambulatory Visit: Payer: Self-pay | Admitting: Family Medicine

## 2012-11-28 ENCOUNTER — Other Ambulatory Visit: Payer: Self-pay | Admitting: Sports Medicine

## 2012-11-28 MED ORDER — LISINOPRIL 20 MG PO TABS: 20.0000 mg | ORAL_TABLET | Freq: Every day | ORAL | Status: DC

## 2012-12-02 ENCOUNTER — Other Ambulatory Visit: Payer: Self-pay | Admitting: Family Medicine

## 2012-12-03 ENCOUNTER — Other Ambulatory Visit: Payer: Self-pay | Admitting: Family Medicine

## 2012-12-03 ENCOUNTER — Other Ambulatory Visit: Payer: Self-pay | Admitting: *Deleted

## 2012-12-03 DIAGNOSIS — G609 Hereditary and idiopathic neuropathy, unspecified: Secondary | ICD-10-CM

## 2012-12-03 MED ORDER — PREGABALIN 100 MG PO CAPS: 100.0000 mg | ORAL_CAPSULE | Freq: Three times a day (TID) | ORAL | Status: DC

## 2012-12-03 NOTE — Telephone Encounter (Signed)
Called to [facility].  [employer]. Ireene Ballowe, M.D. 12/03/2012 4:58 PM

## 2012-12-08 ENCOUNTER — Other Ambulatory Visit: Payer: Self-pay | Admitting: Family Medicine

## 2012-12-11 ENCOUNTER — Other Ambulatory Visit: Payer: Self-pay | Admitting: *Deleted

## 2012-12-11 MED ORDER — LISINOPRIL 20 MG PO TABS: 20.0000 mg | ORAL_TABLET | Freq: Every day | ORAL | Status: DC

## 2012-12-11 MED ORDER — SIMVASTATIN 20 MG PO TABS: 20.0000 mg | ORAL_TABLET | Freq: Every day | ORAL | Status: DC

## 2012-12-16 ENCOUNTER — Other Ambulatory Visit: Payer: Self-pay | Admitting: Family Medicine

## 2012-12-28 ENCOUNTER — Other Ambulatory Visit: Payer: Self-pay | Admitting: Family Medicine

## 2012-12-28 ENCOUNTER — Other Ambulatory Visit: Payer: Self-pay | Admitting: Sports Medicine

## 2013-01-04 ENCOUNTER — Other Ambulatory Visit: Payer: Self-pay | Admitting: Family Medicine

## 2013-01-09 ENCOUNTER — Ambulatory Visit (INDEPENDENT_AMBULATORY_CARE_PROVIDER_SITE_OTHER): Payer: BC Managed Care – PPO | Admitting: Psychology

## 2013-01-09 DIAGNOSIS — F988 Other specified behavioral and emotional disorders with onset usually occurring in childhood and adolescence: Secondary | ICD-10-CM

## 2013-01-09 DIAGNOSIS — F319 Bipolar disorder, unspecified: Secondary | ICD-10-CM

## 2013-01-09 NOTE — Patient Instructions (Addendum)
Please schedule a follow-up for:  July 2nd at 11:30. Eula Flax is a Web designer.  He might be able to do testing with you to determine strengths and weaknesses.  His number is:  [phone].  I do not know if insurance will cover it.  I would have a good idea of what you hope to accomplish if you get this testing.

## 2013-01-09 NOTE — Progress Notes (Signed)
Patrick Elliott presents for follow-up.  Last appointment Dr. Kathrynn Running recommended he get his nortriptyline level drawn and to decrease his dose to 75 mg because it might be having a negative impact on his mood.  The level was a little high at 158 and Patrick Elliott did decrease.  His pain did not increase when he decreased the dose.  Mood and attention issues remain the same.  Discussed possible referral to neuropsychologist who might be able to pinpoint specific strengths and weaknesses and might help adjust Patrick Elliott's expectations.

## 2013-01-09 NOTE — Assessment & Plan Note (Signed)
Continues with difficulty with attention.  Discussed referral to neuropsych and gave him information for [employer].  See patient instructions for further plan.   Refilled three more months of Vyvanse.

## 2013-01-09 NOTE — Assessment & Plan Note (Signed)
Stable with not great control but we have exhausted our pharmacologic strategies and are looking to maintain as much function as possible.  He did not need refills on his medications today.  Report of mood was euthymic.  Affect was consistent.  He smile appropriately.    Follow up on July 2nd or sooner as needed.

## 2013-01-15 ENCOUNTER — Other Ambulatory Visit: Payer: Self-pay | Admitting: Family Medicine

## 2013-01-19 ENCOUNTER — Other Ambulatory Visit: Payer: Self-pay | Admitting: Family Medicine

## 2013-02-03 ENCOUNTER — Other Ambulatory Visit: Payer: Self-pay | Admitting: Family Medicine

## 2013-02-09 ENCOUNTER — Other Ambulatory Visit: Payer: Self-pay | Admitting: Family Medicine

## 2013-02-11 ENCOUNTER — Other Ambulatory Visit: Payer: Self-pay | Admitting: *Deleted

## 2013-02-11 MED ORDER — ACCU-CHEK FASTCLIX LANCETS MISC: 1.0000 | Freq: Three times a day (TID) | Status: DC

## 2013-02-11 NOTE — Telephone Encounter (Signed)
Prior authorization from [facility] received regarding victoza. Byetta is preferred ( pg 22/42 of Medicaid drug book). PAF placed in your box  - please see p 22 in drug book for further requirements.

## 2013-02-13 ENCOUNTER — Telehealth: Payer: Self-pay | Admitting: *Deleted

## 2013-02-13 ENCOUNTER — Other Ambulatory Visit: Payer: Self-pay | Admitting: Sports Medicine

## 2013-02-13 ENCOUNTER — Other Ambulatory Visit: Payer: Self-pay | Admitting: Family Medicine

## 2013-02-13 ENCOUNTER — Other Ambulatory Visit: Payer: Self-pay | Admitting: *Deleted

## 2013-02-13 NOTE — Telephone Encounter (Signed)
Received prior authorization form from Dr. Mikel Cella - upon trying to enter with completed info - pt must have tried Byetta before Victoza will be considered. Has he been on this? Wyatt Haste, RN-BSN

## 2013-02-14 MED ORDER — EXENATIDE 5 MCG/0.02ML ~~LOC~~ SOPN: 5.0000 ug | PEN_INJECTOR | Freq: Two times a day (BID) | SUBCUTANEOUS | Status: DC

## 2013-02-14 NOTE — Telephone Encounter (Signed)
I have changed Victoza to Byetta since we are unable to have Victoza approved. I attempted to get in touch with Patrick Elliott but was unable to speak with him.   Could you please call him to tell him to STOP the Victoza and start Byetta twice daily within 30 minutes of starting a meal. Please apologize to him that we have had to change his medication, but it was due to insurance. If he has any low blood sugar, he should take his glucose.  Please ask him to follow up with me or Dr. Raymondo Band within a few weeks of starting the Byetta.  Thank you, Patrick Elliott, M.D.

## 2013-02-14 NOTE — Telephone Encounter (Signed)
Pt called and statedthat he had been on Byetta "a few months ago" - after going through med hx found that he was on it in aug 2013 - told pt that we would try to process again  Informed Dr. Mikel Cella of pt trial with Byetta - I will enter PA again on [place] [employer].  PA entered on [place] [employer] - will have to wait on approval - Confirmation #[id number] w  Wyatt Haste, RN-BSN

## 2013-02-15 NOTE — Telephone Encounter (Signed)
Prior approval complete - good through 02/14/14 - Confirmation #[id number] w - [facility] called and prescription did go through while on the phone. Wyatt Haste, RN-BSN

## 2013-02-20 ENCOUNTER — Ambulatory Visit (INDEPENDENT_AMBULATORY_CARE_PROVIDER_SITE_OTHER): Payer: BC Managed Care – PPO | Admitting: Psychology

## 2013-02-20 DIAGNOSIS — F319 Bipolar disorder, unspecified: Secondary | ICD-10-CM

## 2013-02-20 NOTE — Patient Instructions (Addendum)
Please schedule a follow-up for August 20th at 11:30. Dr. Kathrynn Running recommended that you decrease your nortriptyline to 50 mg which is two pills a day.  You can take these at the same time or split it. It was great to see you both today.  Good luck with the continued changes.

## 2013-02-20 NOTE — Assessment & Plan Note (Signed)
Patrick Elliott does not have a list today.  States nothing has changed.  Per usual, difficult to gauge mood and function.  Overall sounds very stable.  When Patrick Elliott was having surgeries or traveling, Patrick Elliott was able to cover multiple demands successfully.  Dr. Kathrynn Running recommended decreasing the nortriptyline further to see if we can get more benefit with mood without compromising pain control.  Discussed a systematic way of tracking this as Patrick Elliott tends to have difficulty assessing / reporting symptoms in a reliable way.    Recommended therapy to address some of the issues Patrick Elliott brought up today.  Hard to gauge Patrick Elliott's interest in this.

## 2013-02-20 NOTE — Progress Notes (Signed)
[name] presents for follow-up and [name] is with him today.

## 2013-02-27 ENCOUNTER — Telehealth: Payer: Self-pay | Admitting: Psychology

## 2013-02-27 NOTE — Telephone Encounter (Signed)
Called Patrick Elliott to discuss a switch in his [facility] appointment.  Originally scheduled for follow-up August 20th at 11:30.  That clinic is canceled.  Scheduled him instead for 8/13 at 11:30.  He agreed.  He mentioned that since decreasing the Nortriptyline to 50 mg, he has been having more neuropathy - especially at night.  Discussed with Dr. Kathrynn Running who recommended going back up to 75 mg.  Let Patrick Elliott know.

## 2013-02-28 ENCOUNTER — Encounter: Payer: Self-pay | Admitting: Psychology

## 2013-02-28 ENCOUNTER — Other Ambulatory Visit: Payer: Self-pay

## 2013-03-13 ENCOUNTER — Other Ambulatory Visit: Payer: Self-pay | Admitting: Family Medicine

## 2013-03-22 ENCOUNTER — Other Ambulatory Visit: Payer: Self-pay | Admitting: Family Medicine

## 2013-03-30 ENCOUNTER — Other Ambulatory Visit: Payer: Self-pay | Admitting: Family Medicine

## 2013-04-03 ENCOUNTER — Ambulatory Visit: Payer: BC Managed Care – PPO | Admitting: Psychology

## 2013-04-08 ENCOUNTER — Other Ambulatory Visit: Payer: Self-pay | Admitting: Family Medicine

## 2013-04-09 ENCOUNTER — Other Ambulatory Visit: Payer: Self-pay | Admitting: Family Medicine

## 2013-04-10 ENCOUNTER — Ambulatory Visit: Payer: BC Managed Care – PPO | Admitting: Psychology

## 2013-04-16 ENCOUNTER — Telehealth: Payer: Self-pay | Admitting: Psychology

## 2013-04-16 NOTE — Telephone Encounter (Signed)
According to front office, Patrick Elliott showed up for an [facility] appointment this past week.  Appointment was changed and he acknowledged the change in a phone call dated 7/9.  Called today to follow-up to see if I could get him rescheduled.  Left a VM.

## 2013-04-17 NOTE — Telephone Encounter (Signed)
Patrick Elliott left a VM to reschedule his appointment.  Called back and left a VM.

## 2013-04-22 ENCOUNTER — Other Ambulatory Visit: Payer: Self-pay | Admitting: Family Medicine

## 2013-04-22 ENCOUNTER — Other Ambulatory Visit: Payer: Self-pay | Admitting: Sports Medicine

## 2013-05-22 ENCOUNTER — Ambulatory Visit (INDEPENDENT_AMBULATORY_CARE_PROVIDER_SITE_OTHER): Payer: BC Managed Care – PPO | Admitting: Psychology

## 2013-05-22 DIAGNOSIS — F988 Other specified behavioral and emotional disorders with onset usually occurring in childhood and adolescence: Secondary | ICD-10-CM

## 2013-05-22 DIAGNOSIS — F319 Bipolar disorder, unspecified: Secondary | ICD-10-CM

## 2013-05-22 NOTE — Patient Instructions (Addendum)
Please schedule a follow-up for:  December 3rd at 11:00. Dr. Kathrynn Running talked about benign positional vertigo as the reason you are likely dizzy when you bend over and stand up.  You can read about it and possible exercises to help alleviate it.  If it is still bothersome, please bring it up with your regular doctor.   Dr. Kathrynn Running recommended that you keep track of the "jerking" you described to see if you can establish a pattern - either with medication, time of day, fatigue, stress etc..Marland KitchenThat might help [place] figure out what is going on.  Hopefully it is a temporary thing and will go away on its own. Dr. Kathrynn Running gave you six months of Vyvanse and six months of Ambien. You may want to explore light therapy this fall to see if it might help with your mood.  NatureBright SunTouch Plus Light and Ion Therapy Lamp is on Amazon for around $70 and has good reviews.  It is low risk of harm and the potential to be helpful.

## 2013-05-22 NOTE — Assessment & Plan Note (Addendum)
Mood remains stable for Patrick Elliott which isn't to say he is not without complaints of depressed mood, fatigue, lack of energy, focus and motivation.  No HI / SI.  We have exhausted our pharmacologic options and [name] is fine with this.  He works to manage and tolerate his mood issues.    He thinks there might be a bit of a seasonal component to his mood issues.  Discussed possibly adding light therapy.    See patient instructions for further plan.    25 minutes of face to face contact.

## 2013-05-22 NOTE — Assessment & Plan Note (Signed)
Dr. Kathrynn Running refilled Vyvanse 70 mg #30 with five refills.  Continues with tic but thinks benefit of focus outweighs this.

## 2013-05-22 NOTE — Progress Notes (Signed)
Patrick Elliott presents for follow-up.  His list includes dizziness when bending over and then standing up, jerking - mostly on his left hand side in his arm and hand, physical fatigue and needs prescription refills.  Discussed mood and function.

## 2013-05-29 ENCOUNTER — Encounter: Payer: Self-pay | Admitting: Family Medicine

## 2013-05-29 ENCOUNTER — Ambulatory Visit (INDEPENDENT_AMBULATORY_CARE_PROVIDER_SITE_OTHER): Payer: BC Managed Care – PPO | Admitting: Family Medicine

## 2013-05-29 VITALS — BP 120/80 | HR 76 | Temp 98.7°F | Ht 69.5 in | Wt 186.0 lb

## 2013-05-29 DIAGNOSIS — I872 Venous insufficiency (chronic) (peripheral): Secondary | ICD-10-CM

## 2013-05-29 DIAGNOSIS — F319 Bipolar disorder, unspecified: Secondary | ICD-10-CM

## 2013-05-29 DIAGNOSIS — R5381 Other malaise: Secondary | ICD-10-CM

## 2013-05-29 DIAGNOSIS — M546 Pain in thoracic spine: Secondary | ICD-10-CM

## 2013-05-29 DIAGNOSIS — E1149 Type 2 diabetes mellitus with other diabetic neurological complication: Secondary | ICD-10-CM

## 2013-05-29 DIAGNOSIS — E1140 Type 2 diabetes mellitus with diabetic neuropathy, unspecified: Secondary | ICD-10-CM

## 2013-05-29 DIAGNOSIS — E1142 Type 2 diabetes mellitus with diabetic polyneuropathy: Secondary | ICD-10-CM

## 2013-05-29 DIAGNOSIS — Z23 Encounter for immunization: Secondary | ICD-10-CM

## 2013-05-29 LAB — BASIC METABOLIC PANEL
BUN: 12 mg/dL (ref 6–23)
CO2: 27 mEq/L (ref 19–32)
Chloride: 101 mEq/L (ref 96–112)
Potassium: 4.7 mEq/L (ref 3.5–5.3)

## 2013-05-29 LAB — CBC
Hemoglobin: 14.2 g/dL (ref 13.0–17.0)
MCH: 30.1 pg (ref 26.0–34.0)
MCHC: 35 g/dL (ref 30.0–36.0)
MCV: 86.2 fL (ref 78.0–100.0)
RBC: 4.71 MIL/uL (ref 4.22–5.81)

## 2013-05-29 LAB — POCT GLYCOSYLATED HEMOGLOBIN (HGB A1C): Hemoglobin A1C: 6

## 2013-05-29 MED ORDER — METHOCARBAMOL 500 MG PO TABS: 500.0000 mg | ORAL_TABLET | Freq: Three times a day (TID) | ORAL | Status: DC

## 2013-05-29 NOTE — Patient Instructions (Signed)
Your diabetes looks perfect today!  For your muscle pain, try the Robaxin as needed. Use heat or ice to area.   Lets check your labs today, I will call you with those results.  I will see you back in a few months as needed.  Nelsie Domino M. Babygirl Trager, M.D.
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
## 2013-05-30 DIAGNOSIS — I872 Venous insufficiency (chronic) (peripheral): Secondary | ICD-10-CM | POA: Insufficient documentation

## 2013-05-30 DIAGNOSIS — M546 Pain in thoracic spine: Secondary | ICD-10-CM | POA: Insufficient documentation

## 2013-05-30 LAB — VITAMIN D 25 HYDROXY (VIT D DEFICIENCY, FRACTURES): Vit D, 25-Hydroxy: 34 ng/mL (ref 30–89)

## 2013-05-30 LAB — TSH: TSH: 3.398 u[IU]/mL (ref 0.350–4.500)

## 2013-05-30 LAB — VITAMIN B12: Vitamin B-12: 597 pg/mL (ref 211–911)

## 2013-05-30 NOTE — Assessment & Plan Note (Signed)
Follow up in Mood clinic as previously scheduled. Patient agrees to feeling down today, but states he is fine. Encouraged him to seek help if he needs it and he agrees.

## 2013-05-30 NOTE — Assessment & Plan Note (Signed)
"  Rash" on lower extremities appears to be from chronic venous insufficieny. No red flags (no warmth, tenderness, itching, pain, open lesions.) Will continue to monitor. Continue compression hose and elevation of legs.

## 2013-06-15 ENCOUNTER — Other Ambulatory Visit: Payer: Self-pay | Admitting: Family Medicine

## 2013-06-20 ENCOUNTER — Other Ambulatory Visit: Payer: Self-pay | Admitting: Family Medicine

## 2013-06-27 ENCOUNTER — Other Ambulatory Visit: Payer: Self-pay

## 2013-06-30 ENCOUNTER — Other Ambulatory Visit: Payer: Self-pay | Admitting: Family Medicine

## 2013-07-01 ENCOUNTER — Telehealth: Payer: Self-pay | Admitting: Family Medicine

## 2013-07-01 DIAGNOSIS — G609 Hereditary and idiopathic neuropathy, unspecified: Secondary | ICD-10-CM

## 2013-07-01 MED ORDER — PREGABALIN 100 MG PO CAPS: 100.0000 mg | ORAL_CAPSULE | Freq: Three times a day (TID) | ORAL | Status: DC

## 2013-07-01 NOTE — Telephone Encounter (Signed)
Pt called and would like a refill on Lyrica sent to his pharmacy. JW

## 2013-07-01 NOTE — Telephone Encounter (Signed)
Refill called in #90 with 5 refills.  Amber M. Hairford, M.D.

## 2013-07-03 ENCOUNTER — Other Ambulatory Visit: Payer: Self-pay | Admitting: Family Medicine

## 2013-07-24 ENCOUNTER — Ambulatory Visit (INDEPENDENT_AMBULATORY_CARE_PROVIDER_SITE_OTHER): Payer: BC Managed Care – PPO | Admitting: Psychology

## 2013-07-24 DIAGNOSIS — F988 Other specified behavioral and emotional disorders with onset usually occurring in childhood and adolescence: Secondary | ICD-10-CM

## 2013-07-24 DIAGNOSIS — F319 Bipolar disorder, unspecified: Secondary | ICD-10-CM

## 2013-07-24 NOTE — Assessment & Plan Note (Signed)
Report of mood seems consistent with where he has been before and treatment team does not expect much improvement beyond.  Patrick Elliott noted that he had a period of better mood and function over the summer.  Discussed seasonal elements and Patrick Elliott agrees that he has noticed this in the past.  We had recommended light therapy in the past and he looked into it briefly.  Discussed again today and he may try it.  See patient instructions for further plan.    He was fine following up in March of next year and will call earlier if a need arises.    30 minutes of face to face counseling with the patient.

## 2013-07-24 NOTE — Patient Instructions (Signed)
Please schedule a follow-up for:  March 4th at 11:30 We recommended light therapy to potentially help you get back some better mood management.  10,000 lux full spectrum UV protected.  30 minutes twice a day to start.  Can do up to 2 hours per day.   Happy holidays to both you and [name].  Fingers crossed for a sell on that house!

## 2013-07-24 NOTE — Assessment & Plan Note (Signed)
Tick is better.  No problems noted.  Subjective improvement in focus.  Scripts were written last time and due for refills next year.

## 2013-07-24 NOTE — Progress Notes (Signed)
Patrick Elliott presents for follow-up and is accompanied by [name].  He reports his vertigo and "jerking" are both better.  The latter got worse - more violent - for a short period of time and then went away all together.  In terms of mood, he reports he is depressed for longer periods of times - 2-4 days followed by a period of about a week of "normal" mood.  He adds, "whatever normal means."    Vyvanse helps with mood and focus.

## 2013-08-13 ENCOUNTER — Other Ambulatory Visit: Payer: Self-pay | Admitting: Family Medicine

## 2013-09-06 ENCOUNTER — Other Ambulatory Visit: Payer: Self-pay | Admitting: Family Medicine

## 2013-09-06 ENCOUNTER — Telehealth: Payer: Self-pay | Admitting: Family Medicine

## 2013-09-06 MED ORDER — ACCU-CHEK MULTICLIX LANCETS MISC: Status: DC

## 2013-09-06 NOTE — Telephone Encounter (Signed)
Received refill request for Lyrica. Spoke with pharmacy because 5 refills were listed in our system in November. They stated unclear why rx was sent. They will inform patient it is ready for pickup.

## 2013-09-25 ENCOUNTER — Other Ambulatory Visit: Payer: Self-pay | Admitting: Family Medicine

## 2013-09-26 ENCOUNTER — Other Ambulatory Visit: Payer: Self-pay | Admitting: Family Medicine

## 2013-09-26 MED ORDER — GLUCOSE BLOOD VI STRP: ORAL_STRIP | Status: DC

## 2013-10-10 ENCOUNTER — Other Ambulatory Visit: Payer: Self-pay | Admitting: Family Medicine

## 2013-10-12 ENCOUNTER — Other Ambulatory Visit: Payer: Self-pay | Admitting: Family Medicine

## 2013-10-20 ENCOUNTER — Other Ambulatory Visit: Payer: Self-pay | Admitting: Family Medicine

## 2013-10-22 ENCOUNTER — Other Ambulatory Visit: Payer: Self-pay | Admitting: Family Medicine

## 2013-10-23 ENCOUNTER — Ambulatory Visit (INDEPENDENT_AMBULATORY_CARE_PROVIDER_SITE_OTHER): Payer: BC Managed Care – PPO | Admitting: Psychology

## 2013-10-23 DIAGNOSIS — F988 Other specified behavioral and emotional disorders with onset usually occurring in childhood and adolescence: Secondary | ICD-10-CM

## 2013-10-23 DIAGNOSIS — F319 Bipolar disorder, unspecified: Secondary | ICD-10-CM

## 2013-10-23 NOTE — Patient Instructions (Signed)
Please schedule a follow-up for:  May 20th at 11:30. Dr. Tammi Klippel recommended keeping your medications the same for now.  You may still consider the light therapy to help your mood. Good luck with wedding planning!

## 2013-10-23 NOTE — Assessment & Plan Note (Addendum)
As well managed as it has been.  His report of mood and function is likely the most positive I have heard.  Continue current medications (Lithium, Lamictal, Ambien for sleep).  See back in three months.  Discussed decreasing nortriptyline to attenuate dry mouth.  Reviewed chart.  When he decrased to 50 mg, he started getting bothersome neuropathy at night - hence the 75 mg dose.    Dr. Tammi Klippel does not know what to make of the jerking.  Happens sporadically.  Will continue to monitor.    See patient instructions for further plan.

## 2013-10-23 NOTE — Assessment & Plan Note (Signed)
Adequately managed to meet functional goals.  Dr. Tammi Klippel refilled medicine for six months.

## 2013-10-23 NOTE — Progress Notes (Signed)
Patrick Elliott presents for follow-up.  Last seen in early December.  He reports he has some continued intermittent jerking that makes him drop things.  This is not new but perhaps a bit more pronounced than usual.    He reports some depression that lasts 10 minutes to 10 hours.  Overall, he thinks his mood has been reasonable.  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  Dentist asked Patrick Elliott to ask about increased incidence of cavities secondary to medications that have dry mouth as a side effect.

## 2013-10-31 ENCOUNTER — Other Ambulatory Visit: Payer: Self-pay | Admitting: Family Medicine

## 2013-11-06 ENCOUNTER — Other Ambulatory Visit: Payer: Self-pay | Admitting: Family Medicine

## 2013-11-18 ENCOUNTER — Other Ambulatory Visit: Payer: Self-pay | Admitting: Family Medicine

## 2013-11-25 ENCOUNTER — Telehealth: Payer: Self-pay | Admitting: *Deleted

## 2013-11-25 MED ORDER — ACCU-CHEK MULTICLIX LANCETS MISC: Status: DC

## 2013-11-25 NOTE — Telephone Encounter (Signed)
[facility] from [facility] regarding pt's lancets.  Lancets need to be changed to the multiclix lancet since pt has the Accuchek Fastclix device.  Please call [phone] or fax new Rx to [phone].  Derl Barrow, RN

## 2013-11-25 NOTE — Telephone Encounter (Signed)
Spoke with pharmacist. Rx changed. Will change in our system as well.  Ondra Deboard M. Mikinzie Maciejewski, M.D. 11/25/2013 2:34 PM

## 2013-11-27 ENCOUNTER — Other Ambulatory Visit: Payer: Self-pay | Admitting: Family Medicine

## 2013-12-03 ENCOUNTER — Other Ambulatory Visit: Payer: Self-pay | Admitting: Family Medicine

## 2013-12-04 ENCOUNTER — Other Ambulatory Visit: Payer: Self-pay | Admitting: Family Medicine

## 2013-12-04 NOTE — Telephone Encounter (Signed)
I have refilled these medications recently.  Also, there are 2 types of test strips.  Can you please call the pharmacy to CONFIRM if he needs them? He has a tendency to request the same medication multiple times because of different Rx numbers.  Thanks, [employer] Hairford, M.D.

## 2013-12-04 NOTE — Telephone Encounter (Signed)
No refills are needed at this time.  Derl Barrow, RN

## 2013-12-26 ENCOUNTER — Other Ambulatory Visit: Payer: Self-pay | Admitting: Cardiovascular Disease

## 2013-12-26 ENCOUNTER — Other Ambulatory Visit: Payer: Self-pay | Admitting: Family Medicine

## 2013-12-27 ENCOUNTER — Other Ambulatory Visit: Payer: Self-pay | Admitting: *Deleted

## 2014-01-02 ENCOUNTER — Telehealth: Payer: Self-pay | Admitting: Family Medicine

## 2014-01-02 DIAGNOSIS — G609 Hereditary and idiopathic neuropathy, unspecified: Secondary | ICD-10-CM

## 2014-01-02 NOTE — Telephone Encounter (Signed)
Will forward to Tamika to see if she has gotten a prior auth form pharmacy on this.

## 2014-01-02 NOTE — Telephone Encounter (Signed)
I have not seen this form. Will send to [facility].  Thanks, [employer]. Chanee Henrickson, M.D.

## 2014-01-02 NOTE — Telephone Encounter (Signed)
Pt called because his pharmacy is asking for a prior authorization on his Lyrica and wanted to know if this is done yet.jw

## 2014-01-03 ENCOUNTER — Encounter: Payer: Self-pay | Admitting: Cardiovascular Disease

## 2014-01-03 ENCOUNTER — Other Ambulatory Visit: Payer: Self-pay | Admitting: Family Medicine

## 2014-01-03 MED ORDER — PREGABALIN 100 MG PO CAPS: 100.0000 mg | ORAL_CAPSULE | Freq: Three times a day (TID) | ORAL | Status: DC

## 2014-01-03 NOTE — Telephone Encounter (Signed)
New Rx sent.  Thanks. Yaslyn Cumby M. Kayse Puccini, M.D.

## 2014-01-03 NOTE — Telephone Encounter (Signed)
[facility], [facility] was expired.  A new Rx needs to be sent in.  Derl Barrow, RN

## 2014-01-06 ENCOUNTER — Encounter: Payer: Self-pay | Admitting: *Deleted

## 2014-01-06 NOTE — Telephone Encounter (Signed)
I do not think Mr. Vanderveer needs all of these Rx. I have recently refilled his strips for a year, and there are multiple requests for test strips on here.  Can we [facility] what is truly needed and I will send those in. If they just want to fax it to me, that will be fine.  Thank you, Ona Roehrs M. Adria Costley, M.D.

## 2014-01-06 NOTE — Progress Notes (Signed)
[facility] for Humalog 100 Units/ ML vial.  PA form placed in provider for review.  Derl Barrow, RN

## 2014-01-06 NOTE — Telephone Encounter (Signed)
[facility] and they need all refills on the list other than the methocarbamol.  Derl Barrow, RN

## 2014-01-07 ENCOUNTER — Telehealth: Payer: Self-pay | Admitting: *Deleted

## 2014-01-07 NOTE — Telephone Encounter (Signed)
[facility] stating it needed a prior authorization and refill on Lyrica.  Called [employer] and the representative stated that a prior authorization is not required.  The pharmacy ran a claim but reversed it.  The [employer] representative stated pt has a deducible before insurance will cover.  [facility] and explained to them what information the nurse received.  The pharmacist stated that they did not receive any refills for the Lyrica.  Reviewing pt's record,  Per Dr. Sheral Apley, Lyrica 100 mg 1 capsule by mouth three times daily #90, 5 refills was phone in 01/03/2014.  Lyrica was read back to pharmacist at listed on medication list.  Derl Barrow, RN

## 2014-01-08 ENCOUNTER — Ambulatory Visit (INDEPENDENT_AMBULATORY_CARE_PROVIDER_SITE_OTHER): Payer: BC Managed Care – PPO | Admitting: Psychology

## 2014-01-08 DIAGNOSIS — F319 Bipolar disorder, unspecified: Secondary | ICD-10-CM

## 2014-01-08 NOTE — Assessment & Plan Note (Addendum)
Report of mood is consistent with previous reports of varying moods that shift rapidly and do not last for days.  Patrick Elliott reports periods of activity that go until 4:00 in the morning.  This sounds like part shifts in energy and part preference for a shift in the sleep / wake cycle more toward nocturnal activity.  It seemed clear to the treatment team that psychosocial issues were likely the biggest thing in the room.  No changes in medication recommended.  Encouraged Patrick Elliott to think about her needs and how well the relationship is meeting them.  She is walking on egg shells and [name] is avoiding in his own way.    See patient instructions for further plan.    50 minutes of face to face counseling was provided today.

## 2014-01-08 NOTE — Progress Notes (Signed)
[name] presents with his significant other, [name].  He reports the "jerking" he experienced grew really violent before tapering off the first couple of days in April.  It has not returned.    He reports his depression comes and goes.    He says his sugars have been a little more variable despite not changing his diet.  [name] weighed in on [name]'s mood.  She shared that they are both struggling living in a very small space with very big differences in what they need in terms of chaos, clutter, quiet.  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  [name] attempts to be supportive but she feels very alone in her decision making and there are few points of connection.

## 2014-01-08 NOTE — Progress Notes (Signed)
New prescription for Novolog 100 Units/ML INJECT 2-5 UNITS INTO THE SKIN 3 TIMES A DAY BEFORE MEALS, dispense 10 ML, refills PRN per Dr. Darnelle Going [facility].  Pt is also aware of medication change.   See note below.  Derl Barrow, RN

## 2014-01-08 NOTE — Progress Notes (Signed)
I do not see that Patrick Elliott has any reason why he can't use the preferred brand. Can we please call Patrick Elliott and see if Novolog is ok with him since that is what the insurance prefers? If so, we can send in a new prescription for Novolog with the same directions as his Humalog.  Thanks, [employer] Hairford, M.D.

## 2014-01-08 NOTE — Patient Instructions (Signed)
Please schedule a follow-up for July 15th at 11:30.   It sounds like individual and couple's counseling is the next best step to helping sort out some of the stress you are experiencing.

## 2014-01-13 ENCOUNTER — Other Ambulatory Visit: Payer: Self-pay | Admitting: Family Medicine

## 2014-01-14 ENCOUNTER — Other Ambulatory Visit: Payer: Self-pay | Admitting: Family Medicine

## 2014-01-16 ENCOUNTER — Ambulatory Visit (INDEPENDENT_AMBULATORY_CARE_PROVIDER_SITE_OTHER): Payer: BC Managed Care – PPO | Admitting: Cardiovascular Disease

## 2014-01-16 ENCOUNTER — Other Ambulatory Visit: Payer: Self-pay | Admitting: Family Medicine

## 2014-01-16 ENCOUNTER — Encounter: Payer: Self-pay | Admitting: Cardiovascular Disease

## 2014-01-16 VITALS — BP 119/78 | HR 80 | Ht 69.5 in | Wt 182.6 lb

## 2014-01-16 DIAGNOSIS — I10 Essential (primary) hypertension: Secondary | ICD-10-CM

## 2014-01-16 DIAGNOSIS — E785 Hyperlipidemia, unspecified: Secondary | ICD-10-CM

## 2014-01-16 DIAGNOSIS — I509 Heart failure, unspecified: Secondary | ICD-10-CM

## 2014-01-16 DIAGNOSIS — I5032 Chronic diastolic (congestive) heart failure: Secondary | ICD-10-CM

## 2014-01-16 LAB — BASIC METABOLIC PANEL
BUN: 18 mg/dL (ref 6–23)
CHLORIDE: 105 meq/L (ref 96–112)
CO2: 27 mEq/L (ref 19–32)
Calcium: 9.5 mg/dL (ref 8.4–10.5)
Creatinine, Ser: 1 mg/dL (ref 0.4–1.5)
GFR: 86.72 mL/min (ref >60.00)
Glucose, Bld: 163 mg/dL — ABNORMAL HIGH (ref 70–99)
Potassium: 4.4 mEq/L (ref 3.5–5.1)
SODIUM: 136 meq/L (ref 135–145)

## 2014-01-16 NOTE — Progress Notes (Signed)
Patrick Elliott Date of Birth  Jun 28, 1961 [place] [address]    [place], [place]  [address]    [place], [place]  [phone]  Fax  [phone]  [phone]  Fax [phone]  Problem list: 1. Hypertension 2. Diastolic congestive heart failure 3. Diabetes mellitus 4. Anemia 5.  Cirrhosis of the liver 7. Thrombocytopenia   History of Present Illness:   Patrick Elliott presents with no new cardiac complaints.  He was recently seen by Hematology for thrombocytopenia.   He describes some symptoms of orthostatic hypotension ( tunnel vision when getting up from a bent over position.  Jan 16, 2014:  Patrick Elliott is doing ok.  He has some symptoms of orthostatic hypotension.     Current Outpatient Prescriptions on File Prior to Visit  Medication Sig Dispense Refill  . ACCU-CHEK AVIVA PLUS test strip TEST 3 TIMES A DAY WITH MEAL PLUS UPTO 3 ADDITIONAL TIMES A DAY  250 each  prn  . ACCU-CHEK FASTCLIX LANCETS MISC USE AS DIRECTED  204 each  prn  . BD PEN NEEDLE NANO U/F 32G X 4 MM MISC USE AS DIRECTED TWICE A DAY WITH BYETTA  100 each  2  . carvedilol (COREG) 6.25 MG tablet TAKE 1 TABLET BY MOUTH TWICE A DAY WITH A MEAL  60 tablet  prn  . GLUCAGON EMERGENCY 1 MG injection INJECT PEN (1 MG) INTO THE MUSCLEONCE AS NEEDED  1 kit  2  . HUMALOG 100 UNIT/ML injection INJECT 2-5 UNITS INTO THE SKIN 3 TIMES A DAY BEFORE MEALS  10 mL  prn  . Insulin Pen Needle (BD PEN NEEDLE NANO U/F) 32G X 4 MM MISC USE AS DIRECTED TWICE A DAY WITH BYETTA  100 each  11  . lamoTRIgine (LAMICTAL) 25 MG tablet TAKE 2 TABLETS BY MOUTH TWICE A DAY  120 tablet  prn  . Lancets (ACCU-CHEK MULTICLIX) lancets USE AS DIRECTED  250 each  prn  . Liraglutide (VICTOZA) 18 MG/3ML SOPN Inject 1.21ms daily as directed  18 pen  prn  . lisdexamfetamine (VYVANSE) 70 MG capsule Take 70 mg by mouth every morning. Dr. MTammi Klippelwrote script for Vyvanse #30 with one  dated 10/24/12 and one dated 11/24/12.      .Marland Kitchenlisinopril (PRINIVIL,ZESTRIL) 20 MG tablet Take 1 tablet (20 mg total) by mouth daily.  30 tablet  6  . lithium carbonate (ESKALITH) 450 MG CR tablet TAKE 1 TABLET BY MOUTH ONCE A DAY WITH FOOD  30 tablet  3  . methocarbamol (ROBAXIN) 500 MG tablet TAKE 1 TABLET THREE TIMES A DAY  30 tablet  5  . Multiple Vitamin (MULTIVITAMIN) capsule Take 1 capsule by mouth daily.        . nortriptyline (PAMELOR) 25 MG capsule TAKE 1 CAPSULE EVERY MORNING & 1 CAPSULE AT LUNCH AND 2 CAPSULE EVERY EVENING AT BEDTIME  120 capsule  prn  . omeprazole (PRILOSEC) 20 MG capsule TAKE ONE CAPSULE BY MOUTH ONCE A DAY  30 capsule  1  . pregabalin (LYRICA) 100 MG capsule Take 1 capsule (100 mg total) by mouth 3 (three) times daily.  90 capsule  5  . simvastatin (ZOCOR) 20 MG tablet TAKE 1 TABLET BY MOUTH ONCE A DAY  90 tablet  3  . SUMAtriptan (IMITREX) 50 MG tablet Take 1 tablet (50 mg total) by mouth every 2 (two) hours as needed for migraine.  18 tablet  6  . tadalafil (CIALIS) 20 MG tablet Take 5 mg by mouth as needed.        . triamcinolone ointment (KENALOG) 0.1 %       . zolpidem (AMBIEN) 10 MG tablet Take 10 mg by mouth at bedtime as needed for sleep. Written by Dr. Tammi Klippel on to start 11/20/2013 #30 with 5 refills.  Due for refills on 05/22/2014       No current facility-administered medications on file prior to visit.    Allergies  Allergen Reactions  . Sulfamethoxazole Rash    Past Medical History  Diagnosis Date  . Hypertension   . Diabetes mellitus   . Anemia   . Cirrhosis   . CHF (congestive heart failure)     DIASTOLIC  . Tachycardia   . Dizziness   . Syncope and collapse     Past Surgical History  Procedure Laterality Date  . Knee surgery      History  Smoking status  . Former Smoker  . Quit date: 04/22/2006  Smokeless tobacco  . Never Used    History  Alcohol Use No    Family History  Problem Relation Age of Onset  . Hypertension  Father     Reviw of Systems:  Reviewed in the HPI.  All other systems are negative.  Physical Exam: Blood pressure 119/78, pulse 80, height 5' 9.5" (1.765 m), weight 182 lb 9.6 oz (82.827 kg). General: Well developed, well nourished, in no acute distress.  Head: Normocephalic, atraumatic, sclera non-icteric, mucus membranes are moist,   Neck: Supple. Carotids are 2 + without bruits. No JVD  Lungs: Clear bilaterally to auscultation.  Heart: regular rate  With normal  S1 S2. No murmurs, gallops or rubs.  Abdomen: Soft, non-tender, non-distended with normal bowel sounds. No hepatomegaly. No rebound/guarding. No masses.  Msk:  Strength and tone are normal  Extremities: No clubbing or cyanosis. No edema.  Distal pedal pulses are 2+ and equal bilaterally.  Neuro: Alert and oriented X 3. Moves all extremities spontaneously.  Psych:  Responds to questions appropriately with a normal affect.  ECG: 10/18/12:  Normal sinus rhythm  At 75 with first degree AV block.  Assessment / Plan:

## 2014-01-16 NOTE — Assessment & Plan Note (Signed)
stable °

## 2014-01-16 NOTE — Patient Instructions (Addendum)
Increase your intake of fluids (water with electrolyte tabs like Nun tablets, or gatorade) , protein ( hard boiled eggs, chicken, fish) , and a electrolytes ( V-8 juice, salt, potassium chloride  which is sold as No-Salt  Your physician recommends that you have lab work:  [facility] wants you to follow-up in: 1 year with Dr. Acie Fredrickson.  You will receive a reminder letter in the mail two months in advance. If you don't receive a letter, please call our office to schedule the follow-up appointment.

## 2014-01-16 NOTE — Assessment & Plan Note (Signed)
Shiloh has been having problems with orthostatic hypotension. We discussed the various options including increasing hydration or decreasing his dose of lisinopril. We'll start by increasing his hydration. He'll we'll increase his intake of water and electrolytes. If this doesn't help him after about a week or so then we will decrease his lisinopril to 10 mg a day. I'll see him again in one year unless he needs to be seen for further management of this orthostatic hypotension.  All of his other cardiac issues seem to be well controlled.

## 2014-01-27 ENCOUNTER — Other Ambulatory Visit: Payer: Self-pay | Admitting: Family Medicine

## 2014-01-29 ENCOUNTER — Other Ambulatory Visit: Payer: Self-pay | Admitting: Family Medicine

## 2014-02-03 ENCOUNTER — Other Ambulatory Visit: Payer: Self-pay | Admitting: Family Medicine

## 2014-02-10 ENCOUNTER — Telehealth: Payer: Self-pay | Admitting: *Deleted

## 2014-02-10 NOTE — Telephone Encounter (Signed)
[facility]. Left voice message for Pt to check if he is using Novolog or Humalog.  [facility]03[facility]2014 due to prior authorization needed for Humalog.   PA form placed in Judea Fennimore box for completion. Derl Barrow, RN

## 2014-02-11 ENCOUNTER — Other Ambulatory Visit: Payer: Self-pay | Admitting: Family Medicine

## 2014-02-11 NOTE — Telephone Encounter (Signed)
Mr. Herzberg has MULTIPLE prescriptions of the same medications. This has been an issue multiple times in the past. He should be using Novolog rather than Humalog because that is what his insurance prefers. I will discard this PA, and refill the Novolog if he needs it.  Thanks, [employer]. Mickell Birdwell, M.D.

## 2014-02-11 NOTE — Telephone Encounter (Signed)
Spoke with Lattie Haw[facility] regarding the [facility] for Humalog.  Informed her that pt should be on Novolog.  She stated that she will delete the Humalog and pt does has refills on the Novolog.  Derl Barrow, RN

## 2014-02-18 ENCOUNTER — Other Ambulatory Visit: Payer: Self-pay | Admitting: Family Medicine

## 2014-03-05 ENCOUNTER — Ambulatory Visit (INDEPENDENT_AMBULATORY_CARE_PROVIDER_SITE_OTHER): Payer: BC Managed Care – PPO | Admitting: Psychology

## 2014-03-05 DIAGNOSIS — F319 Bipolar disorder, unspecified: Secondary | ICD-10-CM

## 2014-03-05 NOTE — Progress Notes (Signed)
Patrick Elliott presents for follow-up.  His list includes:  Will he get tolerant to his medications? Jerking comes and goes. Neuropathy is getting worse.  Patrick Elliott attended Susan's daughter's wedding in [place] with success.  [name] is starting a job on Monday and has put an offer in on a house.  Stress has decreased in their household some.    Patrick Elliott needs med refills.

## 2014-03-05 NOTE — Patient Instructions (Signed)
Please schedule a follow-up for 05/28/14 at 11:30. So glad to hear you made it to the wedding.  It looked amazing and I am confident that it was important that you were there. Congrats on the almost house and on the job to [employer]. Refills today were provided. See you in three months.  Call in the meantime if needed.

## 2014-03-05 NOTE — Assessment & Plan Note (Addendum)
There was not a big emphasis on mood issues per se which treatment team saw as a positive thing.  Discussed tolerance to meds.  Dr. Tammi Klippel thinks it is possible but lower in likelihood if it hasn't happened already.  There are new medicines coming out, especially for ADHD, so there is a potential to try new things down the road.  Neuropathy is not something we deal with regularly although we did decrease the nortriptyline that was treating it because of the impact it was having on mood.  He will follow with the doctor that manages this if concerns persist.  Jerking (twitching).  Dr. Tammi Klippel thinks that this is here to stay although it may wax and wane in terms of severity.    45 minutes of face to face care was provided at today's visit.   [name] brought her computer to show examples of [name]'s work.  The trip to [place] went well which is a sign of function for [place].  Also - [name] is interested in having a showing of his work.    Refills given of Lithium (12 months), Lamictal (12 months) and Vyvanse (last script written for 12/15 to hold him through 1/15).    See patient instructions for further plan.

## 2014-03-06 ENCOUNTER — Other Ambulatory Visit: Payer: Self-pay | Admitting: Family Medicine

## 2014-03-07 ENCOUNTER — Encounter: Payer: Self-pay | Admitting: *Deleted

## 2014-03-07 NOTE — Progress Notes (Signed)
PA completed for Victoza. PA pending per [employer].  Confirmation number [id number] Teresita Madura, Rosine Beat, RN  Received PA approval for Victoza via [employer].  Med approved for 03/07/14 - 03/07/2015.  [facility].  PA confirmation number [id number] Teresita Madura, Rosine Beat, RN

## 2014-03-17 ENCOUNTER — Other Ambulatory Visit: Payer: Self-pay | Admitting: *Deleted

## 2014-03-18 MED ORDER — CARVEDILOL 6.25 MG PO TABS: ORAL_TABLET | ORAL | Status: DC

## 2014-03-31 ENCOUNTER — Other Ambulatory Visit: Payer: Self-pay | Admitting: *Deleted

## 2014-04-01 ENCOUNTER — Other Ambulatory Visit: Payer: Self-pay | Admitting: Family Medicine

## 2014-04-01 MED ORDER — ACCU-CHEK FASTCLIX LANCETS MISC: Status: DC

## 2014-04-07 ENCOUNTER — Other Ambulatory Visit: Payer: Self-pay | Admitting: *Deleted

## 2014-04-07 MED ORDER — ACCU-CHEK FASTCLIX LANCETS MISC: Status: DC

## 2014-04-07 NOTE — Telephone Encounter (Signed)
Refill given. Patient should be advised that he needs to be seen in the office for his diabetes some time in the next month as he has not been seen for this since last year. Thanks.

## 2014-04-08 NOTE — Telephone Encounter (Signed)
Left voice message for pt to call and schedule an appt for diabetes follow up. Refill given for his lancets. Derl Barrow, RN

## 2014-04-09 ENCOUNTER — Other Ambulatory Visit: Payer: Self-pay | Admitting: *Deleted

## 2014-04-09 MED ORDER — ACCU-CHEK AVIVA DEVI: Status: DC

## 2014-04-14 ENCOUNTER — Other Ambulatory Visit: Payer: Self-pay | Admitting: *Deleted

## 2014-04-14 MED ORDER — GLUCAGON (RDNA) 1 MG IJ KIT: PACK | INTRAMUSCULAR | Status: DC

## 2014-04-14 NOTE — Telephone Encounter (Signed)
LMOVM for pt to return.  Advised "Dr. Caryl Bis has refilled your medications but would like to see you within the next month for additional refills". Fleeger, Salome Spotted

## 2014-04-14 NOTE — Telephone Encounter (Signed)
Please inform the patient I have refilled his prescription. He has not been seen for his diabetes in 10 months. Please advise him that he needs to schedule an appointment in the next month. Thanks.

## 2014-04-29 ENCOUNTER — Ambulatory Visit (INDEPENDENT_AMBULATORY_CARE_PROVIDER_SITE_OTHER): Payer: BC Managed Care – PPO | Admitting: Family Medicine

## 2014-04-29 ENCOUNTER — Encounter: Payer: Self-pay | Admitting: Family Medicine

## 2014-04-29 VITALS — BP 122/77 | HR 84 | Temp 98.6°F | Wt 182.0 lb

## 2014-04-29 DIAGNOSIS — E1142 Type 2 diabetes mellitus with diabetic polyneuropathy: Secondary | ICD-10-CM

## 2014-04-29 DIAGNOSIS — Z23 Encounter for immunization: Secondary | ICD-10-CM | POA: Diagnosis not present

## 2014-04-29 DIAGNOSIS — E1140 Type 2 diabetes mellitus with diabetic neuropathy, unspecified: Secondary | ICD-10-CM

## 2014-04-29 DIAGNOSIS — Z5181 Encounter for therapeutic drug level monitoring: Secondary | ICD-10-CM

## 2014-04-29 DIAGNOSIS — F319 Bipolar disorder, unspecified: Secondary | ICD-10-CM

## 2014-04-29 DIAGNOSIS — E1149 Type 2 diabetes mellitus with other diabetic neurological complication: Secondary | ICD-10-CM

## 2014-04-29 DIAGNOSIS — E785 Hyperlipidemia, unspecified: Secondary | ICD-10-CM

## 2014-04-29 DIAGNOSIS — I10 Essential (primary) hypertension: Secondary | ICD-10-CM | POA: Diagnosis not present

## 2014-04-29 DIAGNOSIS — I872 Venous insufficiency (chronic) (peripheral): Secondary | ICD-10-CM

## 2014-04-29 LAB — POCT GLYCOSYLATED HEMOGLOBIN (HGB A1C): Hemoglobin A1C: 7

## 2014-04-29 MED ORDER — OMEPRAZOLE 20 MG PO CPDR: DELAYED_RELEASE_CAPSULE | ORAL | Status: DC

## 2014-04-29 MED ORDER — GLUCOSE BLOOD VI STRP: ORAL_STRIP | Status: DC

## 2014-04-29 MED ORDER — INSULIN ASPART 100 UNIT/ML ~~LOC~~ SOLN: 2.0000 [IU] | Freq: Three times a day (TID) | SUBCUTANEOUS | Status: DC

## 2014-04-29 MED ORDER — INSULIN PEN NEEDLE 32G X 4 MM MISC: Status: DC

## 2014-04-29 MED ORDER — ACCU-CHEK FASTCLIX LANCETS MISC: Status: DC

## 2014-04-29 MED ORDER — LISINOPRIL 20 MG PO TABS: 20.0000 mg | ORAL_TABLET | Freq: Every day | ORAL | Status: DC

## 2014-04-29 MED ORDER — CARVEDILOL 6.25 MG PO TABS: ORAL_TABLET | ORAL | Status: DC

## 2014-04-29 NOTE — Patient Instructions (Signed)
Nice to meet you. We are going to continue your insulin as it is at this time. Please schedule an appointment for a lab draw in the next week. We will then call with the results of this.  Please keep your appointment with Dr Gwenlyn Saran on 05/28/14.

## 2014-04-29 NOTE — Assessment & Plan Note (Signed)
Seems to be stable to improving at this time. Will patient will continue to follow with Dr Gwenlyn Saran and Dr Tammi Klippel. Has appointment 05/28/14. To continue lithium, lamictal, and vyvanse at this time.

## 2014-04-29 NOTE — Assessment & Plan Note (Signed)
A1c is at goal. Discussed that tight control (i.e. Pushing A1c as low as possible) is not beneficial and that his A1c is right where we would like it at 7. Will continue current regimen. F/u 3 months.

## 2014-04-29 NOTE — Progress Notes (Signed)
Patient ID: Patrick Elliott, male   DOB: [birth year], 53 y.o.   MRN: [id number]  Patrick Rumps, MD Phone: [phone]  Patrick Elliott is a 53 y.o. male who presents today for f/u.  DIABETES Disease Monitoring: Blood Sugar ranges-checks 4-5 times per day, did not bring his meter in for the visit. Notes typically 150's-200's  Polyuria/phagia/dipsia- no     Optho: reports seeing 3 months ago and was told nothing changed Medications: Compliance- taking daily Hypoglycemic symptoms- notes a couple times a week when he does not eat something at a meal and then take insulin. Notes he will eat a granola bar and feels better.  HYPERTENSION Disease Monitoring Home BP Monitoring not checking Chest pain- no    Dyspnea- no Medications Compliance-  taking.  Edema- yes, see below.  Chronic venous insufficiency: some LE bilateral if he stands for too long. Goes away with propping feet up and wearing compression stockings. No swelling in the morning after sleeping all night. Notes no edema now.  Bipolar: patient with positive PHQ2 and PHQ9 score of 14. Patient notes this issue is stable. He is followed in mood clinic by Dr Gwenlyn Saran and Dr Tammi Klippel. He is pleased with his current treatment regimen. He notes his peaks and valleys are closer together. Takes his medications daily. Feels less depressed than previously. No SI or HI.     Patient is a former smoker.   ROS: Per HPI   Physical Exam Filed Vitals:   04/29/14 1518  BP: 122/77  Pulse: 84  Temp: 98.6 F (37 C)    Gen: Well NAD HEENT: PERRL,  MMM Lungs: CTABL Nl WOB Heart: RRR no MRG Skin: hyperpigmented venous insufficiency darkening of the skin in bilateral LE from ankle to mid shin Exts: Non edematous BL  LE, warm and well perfused.    Assessment/Plan: Please see individual problem list.  # Healthcare maintenance: flu shot given, patient reports optho exam done 3 months ago, A1c done today  Patrick Rumps, MD [facility] PGY-3

## 2014-04-29 NOTE — Assessment & Plan Note (Signed)
Stable. Advised elevation of legs and wearing compression stockings.

## 2014-04-29 NOTE — Assessment & Plan Note (Signed)
Is at goal today. Will continue current lisinopril and coreg dosing. F/u in 3 months.

## 2014-05-05 ENCOUNTER — Other Ambulatory Visit: Payer: Self-pay | Admitting: *Deleted

## 2014-05-05 MED ORDER — ACCU-CHEK FASTCLIX LANCETS MISC: Status: DC

## 2014-05-05 MED ORDER — LITHIUM CARBONATE ER 450 MG PO TBCR: 450.0000 mg | EXTENDED_RELEASE_TABLET | Freq: Every day | ORAL | Status: DC

## 2014-05-05 NOTE — Telephone Encounter (Signed)
Refills given. Please inform the patient that the refill was called in.

## 2014-05-07 NOTE — Telephone Encounter (Signed)
Left voice message informing pt of [facility].  Derl Barrow, RN

## 2014-05-15 ENCOUNTER — Other Ambulatory Visit: Payer: BC Managed Care – PPO

## 2014-05-15 DIAGNOSIS — I10 Essential (primary) hypertension: Secondary | ICD-10-CM

## 2014-05-15 DIAGNOSIS — Z5181 Encounter for therapeutic drug level monitoring: Secondary | ICD-10-CM

## 2014-05-15 DIAGNOSIS — E785 Hyperlipidemia, unspecified: Secondary | ICD-10-CM

## 2014-05-15 NOTE — Progress Notes (Signed)
[place]

## 2014-05-16 LAB — LIPID PANEL
Cholesterol: 107 mg/dL (ref 0–200)
HDL: 35 mg/dL — AB (ref >39)
LDL CALC: 59 mg/dL (ref 0–99)
Total CHOL/HDL Ratio: 3.1 Ratio
Triglycerides: 63 mg/dL (ref <150)
VLDL: 13 mg/dL (ref 0–40)

## 2014-05-16 LAB — COMPREHENSIVE METABOLIC PANEL
ALBUMIN: 4.5 g/dL (ref 3.5–5.2)
ALT: 29 U/L (ref 0–53)
AST: 21 U/L (ref 0–37)
Alkaline Phosphatase: 44 U/L (ref 39–117)
BILIRUBIN TOTAL: 1.1 mg/dL (ref 0.2–1.2)
BUN: 12 mg/dL (ref 6–23)
CO2: 27 mEq/L (ref 19–32)
Calcium: 9.6 mg/dL (ref 8.4–10.5)
Chloride: 102 mEq/L (ref 96–112)
Creat: 1 mg/dL (ref 0.50–1.35)
Glucose, Bld: 110 mg/dL — ABNORMAL HIGH (ref 70–99)
POTASSIUM: 4.1 meq/L (ref 3.5–5.3)
Sodium: 136 mEq/L (ref 135–145)
TOTAL PROTEIN: 7 g/dL (ref 6.0–8.3)

## 2014-05-16 LAB — TSH: TSH: 2.957 u[IU]/mL (ref 0.350–4.500)

## 2014-05-19 ENCOUNTER — Encounter: Payer: Self-pay | Admitting: Family Medicine

## 2014-05-20 ENCOUNTER — Telehealth: Payer: Self-pay | Admitting: Psychology

## 2014-05-20 NOTE — Telephone Encounter (Signed)
Patrick Elliott's scripts had reportedly expired at his pharmacy.  I reviewed the notes and he is due for prescription refills starting 10/1.  I checked with Dr. Tammi Klippel who authorized the refill.  Will do #30 with 5 refills (six months worth). I called [facility] where he gets it filled and authorized the refill on Dr. Johny Shears behalf.

## 2014-05-28 ENCOUNTER — Ambulatory Visit (INDEPENDENT_AMBULATORY_CARE_PROVIDER_SITE_OTHER): Payer: BC Managed Care – PPO | Admitting: Psychology

## 2014-05-28 DIAGNOSIS — F3170 Bipolar disorder, currently in remission, most recent episode unspecified: Secondary | ICD-10-CM | POA: Diagnosis not present

## 2014-05-28 NOTE — Assessment & Plan Note (Signed)
Report of mood is the same.  Hard to get a clear picture of his mood day to day.  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  [name] endorses that Patrick Elliott is happiest when he is able to paint regularly so clearly important but has a downside to it as well.  Attempted to help clarify this with Patrick Elliott but he had difficulty following.  This may be a cognitive issue or a defense.    Dr. Tammi Klippel made the recommendation to take 10 mg of Ambien no sooner than 4 hours after last eating in order to get the best effect.  Will leave other medicines the same.  Treatment team was okay with following in six months however Patrick Elliott would like to be seen in three.  Scheduled for 08/27/14.  See patient instructions for further plan.

## 2014-05-28 NOTE — Progress Notes (Signed)
Patrick Elliott presents for follow-up.  His list is short and with re-occuring items including jerking / twitching which he says is generally better.  He also reports that his mind is moving fast.  He isn't sure what this is about but he doesn't particularly think anything needs to be done about it.  [name]'s sleep cycle is about 4:00 until 10:00 - 6 hours is about the amount of sleep he needs and he gets it most nights.  He takes Ambien to help.  Sometimes 5 mg, sometimes 10 mg and sometimes with a meal and sometimes without.  Discussed.  He has had this sleep pattern all of his life and attempts to change it have been very difficult and ultimately, unsuccessful.    Looked at function.  Patrick Elliott reports he feels like he is in cement - unable to get anything done.  [name] (his significant other) reports that he is active with his animals, helps a lady in his court quite a bit, and helps his parents regularly as well.  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

## 2014-05-28 NOTE — Patient Instructions (Addendum)
Please follow-up on 1/6th at 11:30.  You can call me at [phone] if you can't make it. It was good to see you.  I hope you have a good holiday. Your medications should be good until we see you in January.   Dr. Tammi Klippel recommended a book called Care of the Soul by Earna Coder.

## 2014-06-03 ENCOUNTER — Other Ambulatory Visit: Payer: Self-pay | Admitting: *Deleted

## 2014-06-04 ENCOUNTER — Other Ambulatory Visit: Payer: Self-pay | Admitting: *Deleted

## 2014-06-04 MED ORDER — ACCU-CHEK FASTCLIX LANCETS MISC: Status: DC

## 2014-06-04 MED ORDER — GLUCAGON (RDNA) 1 MG IJ KIT: PACK | INTRAMUSCULAR | Status: DC

## 2014-06-04 NOTE — Telephone Encounter (Signed)
2nd request.  Martin, Tamika L, RN  

## 2014-06-04 NOTE — Telephone Encounter (Signed)
Lithium was prescribed in September and patient was given 11 refills at that time. Already refilled glucagon and lancets. Please inform the patient.

## 2014-06-06 ENCOUNTER — Other Ambulatory Visit: Payer: Self-pay | Admitting: Family Medicine

## 2014-06-06 MED ORDER — LITHIUM CARBONATE ER 450 MG PO TBCR: 450.0000 mg | EXTENDED_RELEASE_TABLET | Freq: Every day | ORAL | Status: DC

## 2014-07-01 ENCOUNTER — Other Ambulatory Visit: Payer: Self-pay | Admitting: *Deleted

## 2014-07-01 ENCOUNTER — Encounter: Payer: Self-pay | Admitting: *Deleted

## 2014-07-01 DIAGNOSIS — G609 Hereditary and idiopathic neuropathy, unspecified: Secondary | ICD-10-CM

## 2014-07-01 MED ORDER — PREGABALIN 100 MG PO CAPS: 100.0000 mg | ORAL_CAPSULE | Freq: Three times a day (TID) | ORAL | Status: DC

## 2014-07-01 NOTE — Progress Notes (Signed)
[facility] Author[facility]. Formulary and PA form placed in provider box for completion. Lulla Linville L, RN  

## 2014-07-02 NOTE — Progress Notes (Signed)
Form filled out and placed in Patrick Elliott's box.  

## 2014-07-03 NOTE — Progress Notes (Signed)
PA pending per [employer].  Confirmation number [id number] Teresita Madura, Rosine Beat, RN  Received PA approval for Victoza via [employer].  Med approved for 07/03/14 - 07/03/15.  [facility]  PA confirmation number [id number] Teresita Madura, Rosine Beat, RN

## 2014-07-04 ENCOUNTER — Other Ambulatory Visit: Payer: Self-pay | Admitting: Family Medicine

## 2014-07-08 ENCOUNTER — Other Ambulatory Visit: Payer: Self-pay | Admitting: *Deleted

## 2014-07-08 MED ORDER — PREGABALIN 100 MG PO CAPS: 100.0000 mg | ORAL_CAPSULE | Freq: Three times a day (TID) | ORAL | Status: DC

## 2014-07-15 ENCOUNTER — Telehealth: Payer: Self-pay | Admitting: *Deleted

## 2014-07-15 ENCOUNTER — Other Ambulatory Visit: Payer: Self-pay | Admitting: Family Medicine

## 2014-07-15 NOTE — Telephone Encounter (Signed)
Pt states that his medicaid needs prior approval on the lyrica.  States that his pharmacy was supposed to contact us already. Jacinta Penalver, Salome Spotted

## 2014-07-21 ENCOUNTER — Other Ambulatory Visit: Payer: Self-pay | Admitting: *Deleted

## 2014-07-22 MED ORDER — METHOCARBAMOL 500 MG PO TABS: 500.0000 mg | ORAL_TABLET | Freq: Three times a day (TID) | ORAL | Status: DC | PRN

## 2014-07-22 MED ORDER — LITHIUM CARBONATE ER 450 MG PO TBCR: 450.0000 mg | EXTENDED_RELEASE_TABLET | Freq: Every day | ORAL | Status: DC

## 2014-08-11 ENCOUNTER — Other Ambulatory Visit: Payer: Self-pay | Admitting: Family Medicine

## 2014-08-11 ENCOUNTER — Other Ambulatory Visit: Payer: Self-pay | Admitting: *Deleted

## 2014-08-12 MED ORDER — METHOCARBAMOL 500 MG PO TABS: 500.0000 mg | ORAL_TABLET | Freq: Three times a day (TID) | ORAL | Status: DC | PRN

## 2014-08-12 MED ORDER — PREGABALIN 100 MG PO CAPS: 100.0000 mg | ORAL_CAPSULE | Freq: Three times a day (TID) | ORAL | Status: DC

## 2014-08-19 ENCOUNTER — Other Ambulatory Visit: Payer: Self-pay | Admitting: Family Medicine

## 2014-08-25 ENCOUNTER — Other Ambulatory Visit: Payer: Self-pay | Admitting: Family Medicine

## 2014-08-26 ENCOUNTER — Other Ambulatory Visit: Payer: Self-pay | Admitting: Family Medicine

## 2014-08-27 ENCOUNTER — Ambulatory Visit (INDEPENDENT_AMBULATORY_CARE_PROVIDER_SITE_OTHER): Payer: BLUE CROSS/BLUE SHIELD | Admitting: Psychology

## 2014-08-27 DIAGNOSIS — F3170 Bipolar disorder, currently in remission, most recent episode unspecified: Secondary | ICD-10-CM

## 2014-08-27 NOTE — Patient Instructions (Signed)
Please schedule a follow-up for 12/10/14 at 11:30. It was great to see you today Patrick Elliott.  No refills needed as best I can tell.  Call me if I made a mistake. Tell [name] we said hello.

## 2014-08-28 NOTE — Progress Notes (Signed)
Patrick Elliott reports for follow-up.  Patrick Elliott is out house-hunting and not able to join him today.  He reports he has a "short list" with many of the same items on it as previous visits.  He states the "jerking" has gone away.  He has difficulty executing after making a decision.  He thinks this might be tied to his ADHD.  He states his depression comes and goes.    Holidays were manageable.

## 2014-08-28 NOTE — Assessment & Plan Note (Signed)
Unable to report on mood because it shifts so much.  Function appears to be consistent with last report.  It continues to be a moving target.  His relative short list of concerns is perhaps a sign that he is doing reasonably well.  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  He was unable to determine whether this would be a wise thing to do, something he desire, or something he would be willing to do.  Thinking about it will not likely move him much.    He does not need refills today.  Will follow in three months or as needed.

## 2014-09-02 ENCOUNTER — Other Ambulatory Visit: Payer: Self-pay | Admitting: Family Medicine

## 2014-09-03 ENCOUNTER — Other Ambulatory Visit: Payer: Self-pay | Admitting: *Deleted

## 2014-09-03 MED ORDER — METHOCARBAMOL 500 MG PO TABS: 500.0000 mg | ORAL_TABLET | Freq: Three times a day (TID) | ORAL | Status: DC | PRN

## 2014-09-09 ENCOUNTER — Other Ambulatory Visit: Payer: Self-pay | Admitting: Family Medicine

## 2014-09-19 ENCOUNTER — Other Ambulatory Visit: Payer: Self-pay | Admitting: Family Medicine

## 2014-09-19 MED ORDER — METHOCARBAMOL 500 MG PO TABS: 500.0000 mg | ORAL_TABLET | Freq: Three times a day (TID) | ORAL | Status: DC | PRN

## 2014-09-19 MED ORDER — SIMVASTATIN 20 MG PO TABS: 20.0000 mg | ORAL_TABLET | Freq: Every day | ORAL | Status: DC

## 2014-10-03 ENCOUNTER — Other Ambulatory Visit: Payer: Self-pay | Admitting: *Deleted

## 2014-10-06 MED ORDER — SIMVASTATIN 20 MG PO TABS: 20.0000 mg | ORAL_TABLET | Freq: Every day | ORAL | Status: DC

## 2014-10-15 ENCOUNTER — Other Ambulatory Visit: Payer: Self-pay | Admitting: Family Medicine

## 2014-10-16 NOTE — Telephone Encounter (Signed)
Please inform patient he needs a follow-up appointment. Refills given.

## 2014-10-17 NOTE — Telephone Encounter (Signed)
Letter mailed. Jazmin Hartsell,CMA  

## 2014-10-30 ENCOUNTER — Other Ambulatory Visit: Payer: Self-pay | Admitting: Family Medicine

## 2014-10-31 ENCOUNTER — Encounter: Payer: Self-pay | Admitting: Family Medicine

## 2014-10-31 ENCOUNTER — Encounter: Payer: Self-pay | Admitting: *Deleted

## 2014-10-31 ENCOUNTER — Ambulatory Visit (INDEPENDENT_AMBULATORY_CARE_PROVIDER_SITE_OTHER): Payer: BLUE CROSS/BLUE SHIELD | Admitting: Family Medicine

## 2014-10-31 VITALS — BP 92/60 | HR 83 | Temp 99.1°F | Ht 67.0 in | Wt 185.6 lb

## 2014-10-31 DIAGNOSIS — H6092 Unspecified otitis externa, left ear: Secondary | ICD-10-CM

## 2014-10-31 DIAGNOSIS — J069 Acute upper respiratory infection, unspecified: Secondary | ICD-10-CM | POA: Diagnosis not present

## 2014-10-31 DIAGNOSIS — B9789 Other viral agents as the cause of diseases classified elsewhere: Principal | ICD-10-CM

## 2014-10-31 DIAGNOSIS — H6090 Unspecified otitis externa, unspecified ear: Secondary | ICD-10-CM | POA: Insufficient documentation

## 2014-10-31 NOTE — Telephone Encounter (Signed)
Patient advised while in clinic today. Tamarius Rosenfield, CMA.

## 2014-10-31 NOTE — Telephone Encounter (Signed)
Refills given. Patient needs follow-up in the next 1-2 months. Please inform patient of this. Thanks.

## 2014-10-31 NOTE — Telephone Encounter (Signed)
Will advise today at appt. Redonna Wilbert, CMA

## 2014-10-31 NOTE — Patient Instructions (Signed)
This is most likely a viral upper respiratory illness. It does not sound like the flu. The treatment is time, rest, and plenty of warm fluids. You can use cough syrup including Robitussin-DM or honey for your throat pain and cough. You can use double strength Mucinex if you're having severe congestion. Follow-up in 1 week if this is not improving at all or sooner if you have severe fevers or trouble breathing or other concerns.  Viral Infections A virus is a type of germ. Viruses can cause:  Minor sore throats.  Aches and pains.  Headaches.  Runny nose.  Rashes.  Watery eyes.  Tiredness.  Coughs.  Loss of appetite.  Feeling sick to your stomach (nausea).  Throwing up (vomiting).  Watery poop (diarrhea). HOME CARE   Only take medicines as told by your doctor.  Drink enough water and fluids to keep your pee (urine) clear or pale yellow. Sports drinks are a good choice.  Get plenty of rest and eat healthy. Soups and broths with crackers or rice are fine. GET HELP RIGHT AWAY IF:   You have a very bad headache.  You have shortness of breath.  You have chest pain or neck pain.  You have an unusual rash.  You cannot stop throwing up.  You have watery poop that does not stop.  You cannot keep fluids down.  You or your child has a temperature by mouth above 102 F (38.9 C), not controlled by medicine.  Your baby is older than 3 months with a rectal temperature of 102 F (38.9 C) or higher.  Your baby is 2 months old or younger with a rectal temperature of 100.4 F (38 C) or higher. MAKE SURE YOU:   Understand these instructions.  Will watch this condition.  Will get help right away if you are not doing well or get worse. Document Released: 07/21/2008 Document Revised: 10/31/2011 Document Reviewed: 12/14/2010 [facility] Patient Information 2015 [place], [place]. This information is not intended to replace advice given to you by your health care provider. Make  sure you discuss any questions you have with your health care provider.

## 2014-10-31 NOTE — Assessment & Plan Note (Signed)
Likely viral URI with cough and viral conjunctivitis, flu unlikely given relatively well-appearing and afebrile. - Reassured. Discussed rest, warm fluids, cough syrup, honey, mucinex DS for conservative mgmt. - F/u 1 week PRN lack of improvement or sooner if symptoms worsen.

## 2014-10-31 NOTE — Progress Notes (Signed)
Patient ID: Patrick Elliott, male   DOB: 1960-11-25, 54 y.o.   MRN: [id number] Subjective:   CC: Cough and cold symptoms  HPI:   Patient presents to sameday clinic for cough and cold symptoms present for about 5 days. His girlfriend had the same symptoms just prior. He has been coughing, throat scratchy, intermittent temerature to 71F, greenish phlegm, nasal congestion, face pain, sore shoulder muscles, decreased appetite. Denies dyspnea, neck stiffness, change in liquid PO, decreased urination, abdominal pain, nausea, or vomiting.  Review of Systems - Per HPI.   PMH - ADD, h/o alcohol abuse, anxiety disorder, bipolar disorder, dCHF, DM, GERD, HLD, HTN, thoracic back pain  DId receive flu shot this year.    Objective:  Physical Exam BP 92/60 mmHg  Pulse 83  Temp(Src) 99.1 F (37.3 C) (Oral)  Ht 5\' 7"  (1.702 m)  Wt 185 lb 9 oz (84.171 kg)  BMI 29.06 kg/m2 GEN: NAD Cardiovascular: Regular rate and rhythm Pulmonary: Clear to auscultation bilaterally, normal effort Abdomen: Soft, nontender, nondistended  HEENT: Atraumatic, normocephalic, sclera mildly erythematous with no exudate or swelling, extra ocular movements intact, left TM with scaling and yellowish dried debris in external auditory meatus, TM mildly thickened, right TM mildly thickened, no erythema or bulging, neck supple, bilateral submandibular nontender lymphadenopathy, oropharynx mildly erythematous with one 30mm exudative speck in posterior left oropharynx, moist mucous membranes, no face tenderness of sinuses    Assessment:     Patrick Elliott is a 54 y.o. male here for cough and cold symptoms.    Plan:     # See problem list and after visit summary for problem-specific plans.   Follow-up: Follow up in 1 week for lack of improvement.   Hilton Sinclair, MD [place]

## 2014-10-31 NOTE — Assessment & Plan Note (Signed)
Left otitis exerna, mild, with occasional symptoms noted chronically. - Discussed using dilute vinegar rinse and returning if no improvement

## 2014-10-31 NOTE — Progress Notes (Signed)
[facility] for [facility]. Formulary and PA form placed in provider box for completion. Derl Barrow, RN

## 2014-11-04 ENCOUNTER — Other Ambulatory Visit: Payer: Self-pay | Admitting: Family Medicine

## 2014-11-04 NOTE — Telephone Encounter (Signed)
Needs refills on syringes. Would like to get a syringe with larger writing on the side

## 2014-11-05 ENCOUNTER — Other Ambulatory Visit: Payer: Self-pay | Admitting: Family Medicine

## 2014-11-05 MED ORDER — ALBIGLUTIDE 30 MG ~~LOC~~ PEN: 30.0000 mg | PEN_INJECTOR | SUBCUTANEOUS | Status: DC

## 2014-11-05 MED ORDER — INSULIN PEN NEEDLE 32G X 4 MM MISC: Status: DC

## 2014-11-05 NOTE — Progress Notes (Signed)
Patient has not failed 2 preferred drugs. Will send in tanzeum from preferred list. Please inform patient. Also patient will need to come in for pharmacy clinic visit once he picks this up. Please schedule this if he is willing.

## 2014-11-06 ENCOUNTER — Other Ambulatory Visit: Payer: Self-pay | Admitting: Family Medicine

## 2014-11-06 ENCOUNTER — Telehealth: Payer: Self-pay | Admitting: Family Medicine

## 2014-11-06 DIAGNOSIS — B9789 Other viral agents as the cause of diseases classified elsewhere: Principal | ICD-10-CM

## 2014-11-06 DIAGNOSIS — J069 Acute upper respiratory infection, unspecified: Secondary | ICD-10-CM

## 2014-11-06 MED ORDER — BENZONATATE 100 MG PO CAPS: 100.0000 mg | ORAL_CAPSULE | Freq: Three times a day (TID) | ORAL | Status: DC | PRN

## 2014-11-06 NOTE — Telephone Encounter (Signed)
Will forward to MD for review who saw pt for this. Reeta Kuk, CMA

## 2014-11-06 NOTE — Telephone Encounter (Signed)
Saw dr T on march 11 for cold. Was told if didn't get better to call back Needs antibotic and cough medicine Tried taking mucinex but it ran sugar levels up Please advise

## 2014-11-06 NOTE — Telephone Encounter (Signed)
I had told him if it is not improving by this week, he needs follow up in clinic to reassess. That is what I suggest. If he has severe worsened symptoms, he should be seen immediately in the ED (if trouble breathing or inability to stay hydrated). I can however send in tessalon perles for cough to see if this helps.  Hilton Sinclair, MD

## 2014-11-06 NOTE — Progress Notes (Signed)
Left pt a voice message informing him of medication change to Tanzeum (preferred). Pt to call for an appt with the pharmacy clinic once he pick up the medication.  Derl Barrow, RN

## 2014-11-07 ENCOUNTER — Other Ambulatory Visit: Payer: Self-pay | Admitting: Family Medicine

## 2014-11-07 NOTE — Telephone Encounter (Signed)
Mychart message sent to patient and VM left. Menelik Mcfarren,CMA

## 2014-11-11 ENCOUNTER — Ambulatory Visit: Payer: BLUE CROSS/BLUE SHIELD | Admitting: Family Medicine

## 2014-11-13 ENCOUNTER — Other Ambulatory Visit: Payer: Self-pay | Admitting: Family Medicine

## 2014-11-13 NOTE — Telephone Encounter (Signed)
PA for victoza previously received and patient was switched to tanzeum as this is a preferred drug. He should be advised of this change and advised to set up f/u with Dr Valentina Lucks for teaching on this medication. Thanks.

## 2014-11-13 NOTE — Telephone Encounter (Signed)
Received fax request for prior authorization from pharmacy for [place].  Copy of Medicaid formulary and form placed in Dr. Ellen Henri box for completion.  Burna Forts, BSN, RN-BC

## 2014-11-16 ENCOUNTER — Other Ambulatory Visit: Payer: Self-pay | Admitting: Family Medicine

## 2014-11-19 ENCOUNTER — Telehealth: Payer: Self-pay | Admitting: *Deleted

## 2014-11-19 ENCOUNTER — Encounter: Payer: Self-pay | Admitting: Psychology

## 2014-11-19 ENCOUNTER — Other Ambulatory Visit: Payer: Self-pay | Admitting: Family Medicine

## 2014-11-19 MED ORDER — LISDEXAMFETAMINE DIMESYLATE 70 MG PO CAPS: 70.0000 mg | ORAL_CAPSULE | ORAL | Status: DC

## 2014-11-19 NOTE — Telephone Encounter (Signed)
-----   Message from Leone Haven, MD sent at 11/19/2014  3:15 PM EDT ----- Regarding: RE: Refill of Vyvanse No problem. I placed the refill at the front for him to pick up. Will CC blue team to call patient to let him know.   Randall Hiss  ----- Message -----    From: Geri Seminole, PsyD    Sent: 11/19/2014   2:41 PM      To: Leone Haven, MD Subject: Refill of Vyvanse                              Lyna Poser.  [name] is scheduled to see you 4/1.  He has a follow-up with [place] in [facility] on 4/20.  He was due to run out of his Vyvanse on 3/15 according to my records.  Any chance you could give him a 30 day script to get him through to our appointment?  We can't call it in otherwise I would ask Dr. Tammi Klippel to take care of it.  If you aren't okay with it, let me know and I will come up with Plan B.  Thanks.

## 2014-11-19 NOTE — Telephone Encounter (Signed)
Patient is aware. Patrick Elliott,CMA  

## 2014-11-21 ENCOUNTER — Encounter: Payer: Self-pay | Admitting: Family Medicine

## 2014-11-21 ENCOUNTER — Ambulatory Visit (INDEPENDENT_AMBULATORY_CARE_PROVIDER_SITE_OTHER): Payer: BLUE CROSS/BLUE SHIELD | Admitting: Family Medicine

## 2014-11-21 VITALS — BP 128/72 | HR 72 | Temp 98.3°F | Ht 67.0 in | Wt 187.0 lb

## 2014-11-21 DIAGNOSIS — E1140 Type 2 diabetes mellitus with diabetic neuropathy, unspecified: Secondary | ICD-10-CM | POA: Diagnosis not present

## 2014-11-21 DIAGNOSIS — I10 Essential (primary) hypertension: Secondary | ICD-10-CM | POA: Diagnosis not present

## 2014-11-21 LAB — POCT GLYCOSYLATED HEMOGLOBIN (HGB A1C): HEMOGLOBIN A1C: 7.6

## 2014-11-21 MED ORDER — INSULIN ASPART 100 UNIT/ML ~~LOC~~ SOLN: 3.0000 [IU] | Freq: Three times a day (TID) | SUBCUTANEOUS | Status: DC

## 2014-11-21 NOTE — Patient Instructions (Addendum)
Nice to see you. We are going to increase your novolog dose slightly to 4-5 units based on meal coverage.  We will get you switched to tanzeum in place of victoza. You will need to follow-up with Dr Valentina Lucks once you get the tanzeum.

## 2014-11-22 LAB — BASIC METABOLIC PANEL
BUN: 9 mg/dL (ref 6–23)
CHLORIDE: 105 meq/L (ref 96–112)
CO2: 24 mEq/L (ref 19–32)
Calcium: 8.7 mg/dL (ref 8.4–10.5)
Creat: 0.85 mg/dL (ref 0.50–1.35)
Glucose, Bld: 160 mg/dL — ABNORMAL HIGH (ref 70–99)
Potassium: 4.2 mEq/L (ref 3.5–5.3)
SODIUM: 138 meq/L (ref 135–145)

## 2014-11-23 NOTE — Assessment & Plan Note (Addendum)
A1c worsened from previously. Very complicated history leading to DM. 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 Was on much higher doses of insulin previously and has backed all the way down to where he is at now. Given elevated A1c and home cbgs trending higher will increase bottom number on novolog to 4 units. He can use 4-5 units novolog TID. Will get him switched to tanzeum as well. Will let us know if hypoglycemia becomes more frequent with these changes. Discussed this plan with Dr Valentina Lucks and he said hello to the patient discussing these changes. Will follow-up with [place] in 2-3 weeks and then with me after this.

## 2014-11-23 NOTE — Progress Notes (Signed)
Patient ID: Patrick Elliott, male   DOB: 05/08/1961, 54 y.o.   MRN: [id number]  Tommi Rumps, MD Phone: [phone]  Patrick Elliott is a 54 y.o. male who presents today for f/u.  DIABETES Disease Monitoring: Blood Sugar ranges-100-220 Polyuria/phagia/dipsia- no      Optho: saw last year, was advised there was no change. Medications: Compliance- novolog 3-5 u TID, januvia (patient was unaware that insurance had changed preferred and he is to start on tanzeum) Hypoglycemic symptoms- 1x/wk  HYPERTENSION Disease Monitoring Home BP Monitoring not checking Chest pain- no    Dyspnea- no Medications Compliance-  Taking coreg and lisinopril  Edema- no   PMH: former smoker    ROS: Per HPI   Physical Exam Filed Vitals:   11/21/14 1641  BP: 128/72  Pulse:   Temp:     Gen: Well NAD HEENT: PERRL,  MMM Lungs: CTABL Nl WOB Heart: RRR  Exts: Non edematous BL  LE, warm and well perfused.    Assessment/Plan: Please see individual problem list.  Tommi Rumps, MD [place] PGY-3

## 2014-11-23 NOTE — Assessment & Plan Note (Signed)
At goal on recheck. Will continue current medications. BMET today.

## 2014-11-24 ENCOUNTER — Telehealth: Payer: Self-pay | Admitting: Family Medicine

## 2014-11-24 ENCOUNTER — Encounter: Payer: Self-pay | Admitting: Family Medicine

## 2014-11-24 NOTE — Telephone Encounter (Signed)
Pt states that while he was injecting his albiglutide on Friday he lost about half of the dose.  He must have pressed it before time.  Patient would like to know if he needs to take a dose early or can just wait until his Friday dose and take it like normal.  Please advise.  Pt is fine with [place] leaving a message on his number if he doesn't answer.  Herron Fero,CMA

## 2014-11-24 NOTE — Telephone Encounter (Signed)
"  Having issues with pins would like to speak with a nurse." Willaim Rayas Doy Mince, ASA

## 2014-11-25 MED ORDER — "INSULIN SYRINGE 30G X 5/16"" 0.5 ML MISC": 4.0000 [IU] | Freq: Three times a day (TID) | Status: DC

## 2014-11-25 NOTE — Telephone Encounter (Signed)
He should reset his schedule to take the tanzeum on Mondays since her took a full dose last night. I sent the syringes to his pharmacy.

## 2014-11-25 NOTE — Telephone Encounter (Signed)
Advised pt as directed below and verbalized understanding. He stated that you can send the regular syringes in. He went on to say that he read directions yesterday and it stated that if you missed a dose to redo dose in 3 days so he took dose last night. Please advise. Melecio Cueto, CMA.

## 2014-11-25 NOTE — Telephone Encounter (Signed)
Patient can continue on his current schedule and take his next dose on Friday. Please also inform him that there are no large print insulin syringes. If he needs regular syringes we can send these in for the patient. Please inform him of this. Thanks.

## 2014-11-25 NOTE — Telephone Encounter (Signed)
LMOVM advising as directed below and to call back for any questions. Will try again later. Darbi Chandran, CMA.

## 2014-11-28 ENCOUNTER — Other Ambulatory Visit: Payer: Self-pay | Admitting: Family Medicine

## 2014-12-04 ENCOUNTER — Encounter: Payer: Self-pay | Admitting: Family Medicine

## 2014-12-04 ENCOUNTER — Other Ambulatory Visit: Payer: Self-pay | Admitting: Family Medicine

## 2014-12-04 NOTE — Telephone Encounter (Signed)
Will forward to MD covering for PCP for review. Pt has appt with Dr. Valentina Lucks 12/05/14 @11 :15am. Addiel Mccardle, CMA.

## 2014-12-05 ENCOUNTER — Encounter: Payer: Self-pay | Admitting: *Deleted

## 2014-12-05 ENCOUNTER — Telehealth: Payer: Self-pay | Admitting: Pharmacist

## 2014-12-05 ENCOUNTER — Encounter: Payer: Self-pay | Admitting: Pharmacist

## 2014-12-05 ENCOUNTER — Encounter: Payer: Self-pay | Admitting: Family Medicine

## 2014-12-05 ENCOUNTER — Ambulatory Visit (INDEPENDENT_AMBULATORY_CARE_PROVIDER_SITE_OTHER): Payer: BLUE CROSS/BLUE SHIELD | Admitting: Pharmacist

## 2014-12-05 ENCOUNTER — Ambulatory Visit (INDEPENDENT_AMBULATORY_CARE_PROVIDER_SITE_OTHER): Payer: BLUE CROSS/BLUE SHIELD | Admitting: Family Medicine

## 2014-12-05 VITALS — BP 160/91 | HR 108 | Temp 98.3°F | Wt 185.0 lb

## 2014-12-05 VITALS — BP 160/91 | HR 108 | Temp 98.6°F | Ht 67.91 in | Wt 185.0 lb

## 2014-12-05 DIAGNOSIS — R300 Dysuria: Secondary | ICD-10-CM | POA: Diagnosis not present

## 2014-12-05 DIAGNOSIS — N3001 Acute cystitis with hematuria: Secondary | ICD-10-CM

## 2014-12-05 DIAGNOSIS — N3000 Acute cystitis without hematuria: Secondary | ICD-10-CM

## 2014-12-05 DIAGNOSIS — R3989 Other symptoms and signs involving the genitourinary system: Secondary | ICD-10-CM | POA: Insufficient documentation

## 2014-12-05 DIAGNOSIS — E1140 Type 2 diabetes mellitus with diabetic neuropathy, unspecified: Secondary | ICD-10-CM

## 2014-12-05 LAB — POCT URINALYSIS DIPSTICK
BILIRUBIN UA: NEGATIVE
GLUCOSE UA: NEGATIVE
Ketones, UA: NEGATIVE
NITRITE UA: POSITIVE
Protein, UA: 30
Spec Grav, UA: 1.02
UROBILINOGEN UA: 1
pH, UA: 7

## 2014-12-05 LAB — POCT UA - MICROSCOPIC ONLY

## 2014-12-05 MED ORDER — CEPHALEXIN 500 MG PO CAPS: 500.0000 mg | ORAL_CAPSULE | Freq: Four times a day (QID) | ORAL | Status: DC

## 2014-12-05 MED ORDER — LIRAGLUTIDE 18 MG/3ML ~~LOC~~ SOPN: 1.8000 mg | PEN_INJECTOR | Freq: Every day | SUBCUTANEOUS | Status: DC

## 2014-12-05 NOTE — Telephone Encounter (Signed)
[facility].  The pharmacy reported that the prescription had been rejected.  I called [employer] for prior authorization. Confirmation number: [id number]  According to the staff member, the prescription had to be reviewed by a clinical pharmacist and would take about 24 hours for approval.  Pharmacy can rerun prescription over the weekend. I will confirm approval on Monday, 12/08/14.  Nicoletta Ba, PharmD, BCPS

## 2014-12-05 NOTE — Progress Notes (Signed)
S:    Patient arrives in good spirits but with complaints of cloudy and "bubbling" urine. Presents for diabetes follow up.   Patient reports adherence with medications. Current diabetes medications include Tanzeum 30 mg weekly and Novolog 3-5 units before each meal. In order to bring his blood glucose down, patient has self-titrated his Novolog up to 50-60 units daily or approximately 15 units before each meal. This increase in insulin requirement has occurred over the last week.   Patient reports that his urine has been "bubbly" and "foaming." He denies painful urination or back pain. These symptoms started in the last 3-4 days.   Patient denies hypoglycemic events.  Patient reported dietary habits: denies any changes over the last week.  Patient reported exercise habits: denies any changes denies any changes over the last week.  Patient feels that his blood glucose has been poorly controlled ever since he stopped the Victoza and was started on Tanzeum.     O:  . Lab Results  Component Value Date   HGBA1C 7.6 11/21/2014     Home CBGs: 126-559  7 day average: 270 14 day average: 264 30 day average 252  A/P: Uncontrolled diabetes with recent elevated blood glucose most likely secondary to urinary tract infection. Patient seen by Dr. Parks Ranger for same day appointment for management of urinary tract infection. Patient to continue to self-titrate Novolog as needed. Patient educated on signs/symptoms of worsening glycemic control and to contact the office for an earlier appointment if his condition worsens.  Patient has failed Tanzeum therapy so will send in script for Victoza and work with insurance company to get that approved. Patient was successfully controlled with Victoza and low doses of novolog previously with A1c of 7.   Next A1C anticipated July 2016.  Written patient instructions provided.  Follow up in Pharmacist Clinic Visit as needed after resolution of UTI. Next visit with  physician for UTI follow up. Total time in face to face counseling 30 minutes.  Patient seen with Eunice Blase, PharmD Candidate and Nicoletta Ba, PharmD, BCPS resident.    Deferred management of change in urine and possible urinary tract infection to Dr. Parks Ranger.  Seen in office same day.

## 2014-12-05 NOTE — Progress Notes (Signed)
[facility] for Patrick Elliott. Formulary and PA form placed in provider box for completion. Derl Barrow, RN

## 2014-12-05 NOTE — Assessment & Plan Note (Signed)
Consistent with acute cystitis (complicated, DM2 male) without evidence of pyelo. Minimal symptoms, but significantly elevated uncontrolled CBGs x 1 week. - Afebrile, well-appearing, tolerating PO - UA - +positive nitrite, large leuks, small hgb, loaded WBCs, few RBCs  Plan: 1. UA 2. Start Keflex 500mg  QID x 7 days (note sulfa allergy) 3. Urine culture - f/u results 4. RTC within 1 week for re-eval CBGs, f/u PCP and pharm clinic as needed following resolution UTI, suspect CBGs will improve

## 2014-12-05 NOTE — Assessment & Plan Note (Signed)
Uncontrolled diabetes with recent elevated blood glucose most likely secondary to urinary tract infection. Patient seen by Dr. Parks Ranger for same day appointment for management of urinary tract infection. Patient to continue to self-titrate Novolog as needed. Patient educated on signs/symptoms of worsening glycemic control and to contact the office for an earlier appointment if his condition worsens.  Patient has failed Tanzeum therapy so will send in script for Victoza and work with insurance company to get that approved. Patient was successfully controlled with Victoza and low doses of novolog previously with A1c of 7.

## 2014-12-05 NOTE — Progress Notes (Signed)
Form completed and placed in Patrick Elliott's box.

## 2014-12-05 NOTE — Patient Instructions (Signed)
Dear Patrick Elliott, Thank you for coming in to clinic today.  1. You have a Urinary Tract infection - Treat with Keflex antibiotic take 1 tablet 4 times daily for next 7 days. We will send your urine for a culture to determine if we need to change antibiotics - otherwise, please finish the entire course even if you feel better in a few days. We will call you if we need to change 2. Follow instructions from Dr. Valentina Lucks regarding using your insulin - i think that the UTI is making your blood sugar elevated. It should improve and you should use less after it is treated  Please schedule a follow-up appointment with Dr. Caryl Bis in next 2-4 weeks for follow-up - return sooner if worsening or new symptoms  You have scheduled follow-up with Dr. Tammi Klippel 4/20  If you have any other questions or concerns, please feel free to call the clinic to contact me. You may also schedule an earlier appointment if necessary.  However, if your symptoms get significantly worse, please go to the Emergency Department to seek immediate medical attention.  Patrick Putnam, DO [facility]   Urinary Tract Infection Urinary tract infections (UTIs) can develop anywhere along your urinary tract. Your urinary tract is your body's drainage system for removing wastes and extra water. Your urinary tract includes two kidneys, two ureters, a bladder, and a urethra. Your kidneys are a pair of bean-shaped organs. Each kidney is about the size of your fist. They are located below your ribs, one on each side of your spine. CAUSES Infections are caused by microbes, which are microscopic organisms, including fungi, viruses, and bacteria. These organisms are so small that they can only be seen through a microscope. Bacteria are the microbes that most commonly cause UTIs. SYMPTOMS  Symptoms of UTIs may vary by age and gender of the patient and by the location of the infection. Symptoms in young women typically include  a frequent and intense urge to urinate and a painful, burning feeling in the bladder or urethra during urination. Older women and men are more likely to be tired, shaky, and weak and have muscle aches and abdominal pain. A fever may mean the infection is in your kidneys. Other symptoms of a kidney infection include pain in your back or sides below the ribs, nausea, and vomiting. DIAGNOSIS To diagnose a UTI, your caregiver will ask you about your symptoms. Your caregiver also will ask to provide a urine sample. The urine sample will be tested for bacteria and white blood cells. White blood cells are made by your body to help fight infection. TREATMENT  Typically, UTIs can be treated with medication. Because most UTIs are caused by a bacterial infection, they usually can be treated with the use of antibiotics. The choice of antibiotic and length of treatment depend on your symptoms and the type of bacteria causing your infection. HOME CARE INSTRUCTIONS  If you were prescribed antibiotics, take them exactly as your caregiver instructs you. Finish the medication even if you feel better after you have only taken some of the medication.  Drink enough water and fluids to keep your urine clear or pale yellow.  Avoid caffeine, tea, and carbonated beverages. They tend to irritate your bladder.  Empty your bladder often. Avoid holding urine for long periods of time.  Empty your bladder before and after sexual intercourse.  After a bowel movement, women should cleanse from front to back. Use each tissue only once. [place]  CARE IF:   You have back pain.  You develop a fever.  Your symptoms do not begin to resolve within 3 days. SEEK IMMEDIATE MEDICAL CARE IF:   You have severe back pain or lower abdominal pain.  You develop chills.  You have nausea or vomiting.  You have continued burning or discomfort with urination. MAKE SURE YOU:   Understand these instructions.  Will watch your  condition.  Will get help right away if you are not doing well or get worse. Document Released: 05/18/2005 Document Revised: 02/07/2012 Document Reviewed: 09/16/2011 [facility] Patient Information 2015 [place], [place]. This information is not intended to replace advice given to you by your health care provider. Make sure you discuss any questions you have with your health care provider.

## 2014-12-05 NOTE — Progress Notes (Signed)
   Subjective:    Patient ID: Patrick Elliott, male    DOB: February 04, 1961, 54 y.o.   MRN: [id number]  Patient presents for a same day appointment.  HPI  UTI SYMPTOMS: - Reported that urine was cloudy and "bubbly" over past 1 week also admits to intermittent lower abd tenderness for few days. Otherwise, his primary concerns were related to elevated CBGs difficult to control recently. - No significant prior history of UTIs. No recent abx course - Denies any hematuria, dysuria, flank or back pain, fevers/chills  DIABETES, TYPE 2, uncontrolled: - Patient seen in pharmacy clinic today for medication adjustment with recent elevated CBGs. - Previously on Tanzeum 30mg  weekly, Novolog 3-5 units WC, however recently in past week he had been self titrating up Novolog up to 15 u per meal to control high sugars ranging CBGs 250-350s - Pharmacy clinic with plans to switch back to Victoza (from Tanzeum) and continue Novolog  I have reviewed and updated the following as appropriate: allergies and current medications  Social Hx: Non-smoker  Review of Systems  See above HPI    Objective:   Physical Exam  BP 160/91 mmHg  Pulse 108  Wt 185 lb (83.915 kg)  Gen - well-appearing, pleasant and cooperative, NAD HEENT - MMM Heart - mild tachycardia with regular rhythm, no murmurs heard Abd - soft, +mild suprapubic tenderness otherwise non-tender, non-distended, no masses MSK - no back tenderness or CVAT Skin - warm, dry Neuro - awake, alert     Assessment & Plan:   See specific A&P problem list for details.

## 2014-12-05 NOTE — Patient Instructions (Signed)
It was good to see you today Patrick Elliott.  Continue to adjust your Novolog as you need to in order to keep your blood sugar down.  We will try to switch you back to Victoza.

## 2014-12-07 LAB — URINE CULTURE
COLONY COUNT: NO GROWTH
Organism ID, Bacteria: NO GROWTH

## 2014-12-08 NOTE — Progress Notes (Signed)
Patient ID: Patrick Elliott, male   DOB: 1960/11/18, 54 y.o.   MRN: [id number] Reviewed: Agree with Dr. Graylin Shiver documentation and management.

## 2014-12-08 NOTE — Progress Notes (Signed)
PA pending per [place] [employer].  Confirmation number [id number] W. Derl Barrow, RN  Received PA approval for Victoza 3 Pens per month via  Tracks.  Med approved for 12/08/14 - 12/08/15.  [facility] informed.  PA confirmation number [id number] W. Derl Barrow, RN

## 2014-12-10 ENCOUNTER — Ambulatory Visit (INDEPENDENT_AMBULATORY_CARE_PROVIDER_SITE_OTHER): Payer: BLUE CROSS/BLUE SHIELD | Admitting: Psychology

## 2014-12-10 ENCOUNTER — Other Ambulatory Visit: Payer: Self-pay | Admitting: Family Medicine

## 2014-12-10 DIAGNOSIS — F3170 Bipolar disorder, currently in remission, most recent episode unspecified: Secondary | ICD-10-CM

## 2014-12-10 NOTE — Assessment & Plan Note (Signed)
Patrick Elliott has a difficult time (historically and today) reporting on mood.  Affect is within normal limits.  No red flags per treatment team.  He has a new dog and is getting some exercise.  Does not think recent deaths are impacting his sleep.  Does not seem particularly interested in changing anything right now.  Says current sleep pattern largely works for him.  Discussed briefly social rhythm therapy.  Prescriptions for Vyvanse and Ambien given for 6 months.

## 2014-12-10 NOTE — Patient Instructions (Signed)
Please schedule a follow up for October 5th at 11:30. Call us in the mean time if anything changes.

## 2014-12-10 NOTE — Progress Notes (Signed)
Patrick Elliott presents for follow-up.  He has a couple of things on his agenda including sleeping about 4-6 hours a night, twitching (now in body as opposed to hands), and experiencing some deaths around him (two neighbors - one of whom he found, and his dog).  His main reason for presenting today is to get refills on his Vyvanse and Ambien.

## 2014-12-12 ENCOUNTER — Other Ambulatory Visit: Payer: Self-pay | Admitting: Family Medicine

## 2014-12-19 ENCOUNTER — Other Ambulatory Visit: Payer: Self-pay | Admitting: Family Medicine

## 2014-12-24 ENCOUNTER — Other Ambulatory Visit: Payer: Self-pay | Admitting: Family Medicine

## 2014-12-25 ENCOUNTER — Other Ambulatory Visit: Payer: Self-pay | Admitting: Family Medicine

## 2014-12-25 ENCOUNTER — Encounter: Payer: Self-pay | Admitting: Family Medicine

## 2014-12-26 ENCOUNTER — Telehealth: Payer: Self-pay | Admitting: Family Medicine

## 2014-12-26 MED ORDER — PREGABALIN 100 MG PO CAPS: 100.0000 mg | ORAL_CAPSULE | Freq: Three times a day (TID) | ORAL | Status: DC

## 2014-12-26 NOTE — Telephone Encounter (Signed)
Has been treated for UTI and doesn't feel like it is helping. He says that it still looks "hazy"; and would like advice as to what to do. He would like to know if something else should be prescribed or if he should wait for the next appt on the 11th. / Also, his needs his Lyrica refilled. Thank you, Fonda Kinder, ASA

## 2014-12-26 NOTE — Telephone Encounter (Signed)
Patient calls reporting that has continued to have murky urine following treatment with antibiotic. Finished the course over a week ago. Denies dysuria, frequency, and urgency. No abdominal pain. No hematuria. Advised him of negative urine culture and the fact that he did not have a urine infection. Advised that if her were to develop abdominal pain, discomfort in his abdomen, fever, urgency, or frequency he should get seen over the weekend. Otherwise he can follow-up with me next week as scheduled. Will also send in refill for lyrica.

## 2014-12-29 ENCOUNTER — Other Ambulatory Visit: Payer: Self-pay | Admitting: Family Medicine

## 2014-12-29 NOTE — Telephone Encounter (Signed)
Prior authorization is needed in order for Lyrica to be filled/ Thanks, Fonda Kinder, ASA

## 2014-12-29 NOTE — Telephone Encounter (Signed)
Will forward to MD to make him aware of PA and RN team. Johnney Ou

## 2014-12-30 NOTE — Telephone Encounter (Signed)
[facility] Authorization received from [facility] for Lyrica 100 mg. Formulary and PA form placed in provider box for completion. Derl Barrow, RN

## 2014-12-30 NOTE — Telephone Encounter (Signed)
PA completed and placed in Elverta Martin's box

## 2014-12-31 ENCOUNTER — Encounter: Payer: Self-pay | Admitting: Family Medicine

## 2014-12-31 ENCOUNTER — Other Ambulatory Visit (HOSPITAL_COMMUNITY)
Admission: RE | Admit: 2014-12-31 | Discharge: 2014-12-31 | Disposition: A | Discharge status: Home / Self Care | Payer: BLUE CROSS/BLUE SHIELD | Source: Ambulatory Visit | Attending: Family Medicine | Admitting: Family Medicine

## 2014-12-31 ENCOUNTER — Ambulatory Visit (INDEPENDENT_AMBULATORY_CARE_PROVIDER_SITE_OTHER): Payer: BLUE CROSS/BLUE SHIELD | Admitting: Family Medicine

## 2014-12-31 VITALS — BP 130/69 | HR 82 | Temp 98.3°F | Ht 67.0 in | Wt 183.5 lb

## 2014-12-31 DIAGNOSIS — N489 Disorder of penis, unspecified: Secondary | ICD-10-CM

## 2014-12-31 DIAGNOSIS — Z113 Encounter for screening for infections with a predominantly sexual mode of transmission: Secondary | ICD-10-CM | POA: Insufficient documentation

## 2014-12-31 DIAGNOSIS — F3170 Bipolar disorder, currently in remission, most recent episode unspecified: Secondary | ICD-10-CM | POA: Diagnosis not present

## 2014-12-31 DIAGNOSIS — R3989 Other symptoms and signs involving the genitourinary system: Secondary | ICD-10-CM

## 2014-12-31 LAB — POCT URINALYSIS DIPSTICK
BILIRUBIN UA: NEGATIVE
Glucose, UA: 250
KETONES UA: NEGATIVE
Nitrite, UA: NEGATIVE
PH UA: 7
Protein, UA: NEGATIVE
RBC UA: NEGATIVE
SPEC GRAV UA: 1.015
Urobilinogen, UA: 2

## 2014-12-31 LAB — POCT UA - MICROSCOPIC ONLY

## 2014-12-31 NOTE — Telephone Encounter (Addendum)
PA pending per [place] [employer].  Confirmation number [id number] W. Derl Barrow, RN   Received PA approval for Lyrica 100 mg via [employer].  Med approved for 12/31/14 - 12/31/15.  [facility]  PA confirmation number [id number] W . Derl Barrow, RN

## 2014-12-31 NOTE — Patient Instructions (Signed)
Nice to see you. Please monitor the pinching sensation. If it worsens or does not improve in the next 1-2 weeks please let us know.  If you feel as though you are going to hurt yourself or anyone else please seek medical attention.

## 2015-01-01 LAB — URINE CYTOLOGY ANCILLARY ONLY
Chlamydia: NEGATIVE
Neisseria Gonorrhea: NEGATIVE

## 2015-01-02 LAB — URINE CULTURE
COLONY COUNT: NO GROWTH
ORGANISM ID, BACTERIA: NO GROWTH

## 2015-01-02 NOTE — Assessment & Plan Note (Addendum)
Appears to have worsened depression aspect of bipolar at this time. No SI or HI. Patient safe at this time. Discussed patient with Dr Gwenlyn Saran and will have her reach out to the patient to discuss coming in for follow-up in mood clinic.

## 2015-01-02 NOTE — Assessment & Plan Note (Signed)
Patients symptoms actually seem to be more urethral in nature. Issue of a pinching and stopping of flow could represent a stricture. I discussed this with the patient and offered urology referral at this time. He declined preferring to monitor this. Will recheck UA and check GC/Chlamydia. No tenderness on prostate exam so less likely related to prostatitis. If not better in the next week or worsens he is to let us know. Given return precautions.

## 2015-01-02 NOTE — Progress Notes (Signed)
Patient ID: Patrick Elliott, male   DOB: 1961/01/19, 54 y.o.   MRN: [id number]  Tommi Rumps, MD Phone: [phone]  Patrick Elliott is a 54 y.o. male who presents today for f/u.  Patient reports his urine has started to clear up. He was previously treated for a UTI, though UCx did not grow bacteria. No burning, frequency, or urgency. He does not a pinching sensation in the urethra near the glans of the penis for the past month that feels as though the urine stops briefly prior to resuming flow. Notes similar issue with orgasms. No perineal discomfort. No abdominal discomfort. No hematuria.  Depression: notes this has worsened recently with a number of deaths of those near to him. He reports low energy. Takes longer to get things done. Sleep has been an issue and feels poorly about himself. No history of thyroid issues. No dyspnea or fatigue. No SI or HI. PHQ9 22  PMH: former smoker.   ROS: Per HPI   Physical Exam Filed Vitals:   12/31/14 1538  BP: 130/69  Pulse: 82  Temp: 98.3 F (36.8 C)    Gen: Well NAD HEENT: PERRL,  MMM Lungs: CTABL Nl WOB Heart: RRR  Abd: soft, NT, ND GU: normal circumcised penis, no erythema or drainage at the urethral meatus, no discharge, normal testicles, no hernias palpated Rectal: normal prostate without nodularity or enlargement Exts: Non edematous BL  LE, warm and well perfused.    Assessment/Plan: Please see individual problem list.  Tommi Rumps, MD [place] PGY-3

## 2015-01-02 NOTE — Addendum Note (Signed)
Addended by: Caryl Bis Daxtin Leiker G on: 01/02/2015 01:55 PM   Modules accepted: Level of Service

## 2015-01-05 ENCOUNTER — Telehealth: Payer: Self-pay | Admitting: Family Medicine

## 2015-01-05 ENCOUNTER — Telehealth: Payer: Self-pay | Admitting: Psychology

## 2015-01-05 NOTE — Telephone Encounter (Signed)
Attempted to call patient about results. Left message asking to him to call office back. No identifying or medical information was left on the voicemail. His urine culture and cytology testing were negative. If he calls back please ask if he is continuing to have symptoms. Thanks.

## 2015-01-05 NOTE — Telephone Encounter (Signed)
Patient saw Dr. Caryl Bis on 12/31/2014 and reported worsening depression symptoms.  Called patient to see if he would like to come in to [facility] to discuss.  His scheduled follow-up is not until 05/2015.  Left a VM.

## 2015-01-06 ENCOUNTER — Other Ambulatory Visit: Payer: Self-pay | Admitting: *Deleted

## 2015-01-06 MED ORDER — PREGABALIN 100 MG PO CAPS: 100.0000 mg | ORAL_CAPSULE | Freq: Three times a day (TID) | ORAL | Status: DC

## 2015-01-06 NOTE — Telephone Encounter (Signed)
LM for patient on VM to call office back. Rice Walsh,CMA

## 2015-01-07 NOTE — Telephone Encounter (Signed)
Returned call

## 2015-01-08 ENCOUNTER — Other Ambulatory Visit: Payer: Self-pay | Admitting: Family Medicine

## 2015-01-08 NOTE — Telephone Encounter (Signed)
LM for patient to call back.  Please ask him if he uses his [place] since I see that it is active.  Thanks [employer]

## 2015-01-09 ENCOUNTER — Encounter: Payer: Self-pay | Admitting: *Deleted

## 2015-01-09 NOTE — Telephone Encounter (Signed)
Talked with Patrick Elliott.  He was a bit difficult to understand (lots of background noise and a not so great connection).  Attempts to make this better were not successful but I think I clarified that fatigue and "body jerks" are his most bothersome symptoms presently.  Depression remains with mood shift sometimes minute by minute.  I tried to get at whether he wants to come back in to [facility] with a more focused visit on these symptoms, whether he thinks a therapy appointment might be helpful, or whether he thinks this is what is to be expected given his history and illness.  He was unable to clearly state.  I offered to discuss with Dr. Tammi Klippel and get back to him.

## 2015-01-09 NOTE — Telephone Encounter (Signed)
Letter sent to patient via mychart. Jazmin Hartsell,CMA

## 2015-01-12 NOTE — Telephone Encounter (Signed)
LM for patient to call back.  Will wait to hear from here on medication refill. Patrick Elliott,CMA

## 2015-01-12 NOTE — Telephone Encounter (Signed)
Attempted to call patient to confirm dosing of lamictal. It appears that he is taking a different dose than what is listed in the computer. Left a VM asking him to call the office back at his convenience. No medical information left on the line. Please confirm his lamictal dose when he calls back and I will then send it in. Thanks.

## 2015-01-13 NOTE — Telephone Encounter (Signed)
Discussed with Dr. Tammi Klippel who agreed with a closer [place] follow-up and focus on one or two symptoms that are bothering Patrick Elliott the most.  We can see what he might be interested in doing about it in light of what options remain from our end.  I called Patrick Elliott to discuss and left a VM.

## 2015-01-14 NOTE — Telephone Encounter (Signed)
Called home and mobile number to again attempt to confirm his dosing of lamictal. Left message asking that he call our office back. Will await his call.

## 2015-01-17 ENCOUNTER — Other Ambulatory Visit: Payer: Self-pay | Admitting: Family Medicine

## 2015-01-19 ENCOUNTER — Encounter: Payer: Self-pay | Admitting: Family Medicine

## 2015-01-19 ENCOUNTER — Other Ambulatory Visit: Payer: Self-pay | Admitting: Family Medicine

## 2015-01-19 MED ORDER — LAMOTRIGINE 25 MG PO TABS: 100.0000 mg | ORAL_TABLET | Freq: Every day | ORAL | Status: DC

## 2015-01-19 NOTE — Telephone Encounter (Signed)
Finally able to get in touch with the patient. Confirmed the dose of lamictal that patient is taking to be 100 mg once daily. Refills sent in to pharmacy. Also advised patient that letter for jury duty was placed at front for patient to pick up in the morning.

## 2015-01-20 ENCOUNTER — Other Ambulatory Visit: Payer: Self-pay | Admitting: *Deleted

## 2015-01-20 ENCOUNTER — Encounter: Payer: Self-pay | Admitting: Family Medicine

## 2015-01-20 MED ORDER — OMEPRAZOLE 20 MG PO CPDR: DELAYED_RELEASE_CAPSULE | ORAL | Status: DC

## 2015-01-25 ENCOUNTER — Other Ambulatory Visit: Payer: Self-pay | Admitting: Family Medicine

## 2015-01-26 ENCOUNTER — Other Ambulatory Visit: Payer: Self-pay | Admitting: *Deleted

## 2015-01-26 MED ORDER — LAMOTRIGINE 25 MG PO TABS: 100.0000 mg | ORAL_TABLET | Freq: Every day | ORAL | Status: DC

## 2015-01-26 MED ORDER — GLUCOSE BLOOD VI STRP: ORAL_STRIP | Status: DC

## 2015-02-03 LAB — HM DIABETES EYE EXAM

## 2015-02-04 ENCOUNTER — Ambulatory Visit (INDEPENDENT_AMBULATORY_CARE_PROVIDER_SITE_OTHER): Payer: BLUE CROSS/BLUE SHIELD | Admitting: Psychology

## 2015-02-04 DIAGNOSIS — F3175 Bipolar disorder, in partial remission, most recent episode depressed: Secondary | ICD-10-CM | POA: Diagnosis not present

## 2015-02-04 NOTE — Patient Instructions (Signed)
It doesn't sound like a change in medicine is of interest nor something we think would be beneficial. The twitching / jerking may be related to the Vyvanse.  We would need to decrease that medicine to determine if that is the case.  It does not sound like you are interested in that today. Dr. Tammi Klippel suggested some audiobooks to explore some of the questions that are swirling around your head.

## 2015-02-04 NOTE — Progress Notes (Signed)
Patrick Elliott presents for follow-up with [name]. Goal was to focus on one or two significant symptoms and spend a bit more time discussing them.  Depression was one such symtpom.  With the loss of his beloved dog, Max, a neighbor, and two cats, Patrick Elliott has been thinking a lot more about death, the time he has left, and meaning and purpose.  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  Existential themes abound here.  The other symptom he brought up was the twitching or jerking.  He wonders whether it is medication related or indicative of something else (something bad).

## 2015-02-04 NOTE — Assessment & Plan Note (Signed)
Mood is reported as depressed but unable to quantify how frequently or for how long in any kind of pattern.  He is unable to come up with any ideas as to how to address the existential themes he is grappling with.  Therapy was cost-prohibitive.  He doesn't like to read.  Audio-books are a possibility.  He seems unable to state whether he desires, needs, or has a reason to pursue this further.  His partner thinks he does and this sense that he has no purpose or is "wasting time" is a major driver of poor mood for him.  Discussed backing off the Vyvanse as a way to see if it made the twitching better.  He said he thinks his medicines are fine.  Does not want to adjust.  That is the first thing we would likely do to determine the cause of the symptom and he is not interested in pursuing.    We are scheduled to see him back in September.  He can call in between if needed.

## 2015-02-14 ENCOUNTER — Other Ambulatory Visit: Payer: Self-pay | Admitting: Family Medicine

## 2015-02-16 ENCOUNTER — Other Ambulatory Visit: Payer: Self-pay

## 2015-02-16 NOTE — Telephone Encounter (Signed)
Patient needs an appointment if he feels he needs a refill on an antibiotic. Please inform him of this. Thanks.

## 2015-02-16 NOTE — Telephone Encounter (Signed)
Patient needs and appointment for refills on this medication.

## 2015-02-21 ENCOUNTER — Other Ambulatory Visit: Payer: Self-pay | Admitting: Family Medicine

## 2015-02-27 ENCOUNTER — Telehealth: Payer: Self-pay | Admitting: *Deleted

## 2015-02-27 MED ORDER — GLUCOSE BLOOD VI STRP: ORAL_STRIP | Status: DC

## 2015-02-27 MED ORDER — LAMOTRIGINE 25 MG PO TABS: 100.0000 mg | ORAL_TABLET | Freq: Every day | ORAL | Status: DC

## 2015-03-09 ENCOUNTER — Telehealth: Payer: Self-pay | Admitting: Psychology

## 2015-03-09 ENCOUNTER — Other Ambulatory Visit: Payer: Self-pay | Admitting: *Deleted

## 2015-03-09 NOTE — Telephone Encounter (Signed)
I called Patrick Elliott to get some additional information about the Nortriptyline script.

## 2015-03-09 NOTE — Telephone Encounter (Signed)
Dr. Minda Ditto spoke with me about this.  I reviewed the record.  Dr. Tammi Klippel had recommended decreasing the Nortriptyline from 100 mg to 75 mg back on 01/09/2013 thinking that the dose might have been having a negative impact on his mood.  According to the note, [name] eventually decreased to 50 mg but was having more pain complaints.  He was going to retest at 75 mg and make a determination of cost / benefit.  We last touched base on this in 02/2014.  I think he was taking 75 mg at the time.    I put a phone call in to him to get some further information as Dr. Minda Ditto said the script hadn't been refilled for some time.

## 2015-03-10 NOTE — Telephone Encounter (Signed)
Will forward to PCP for review. Daniqua Campoy, CMA. 

## 2015-03-10 NOTE — Telephone Encounter (Signed)
Pt is checking status of refill for nortriptyline, [facility] [place]

## 2015-03-10 NOTE — Telephone Encounter (Addendum)
Patrick Elliott called back to confirm that he is taking 75 mg of Nortriptyline and would like a refill.  He is not sure who last prescribed it but states he has been taking it consistently and for awhile.  I will ask Dr. Minda Ditto, his new PCP to refill.  [facility] [place]

## 2015-03-11 MED ORDER — NORTRIPTYLINE HCL 25 MG PO CAPS: ORAL_CAPSULE | ORAL | Status: DC

## 2015-03-11 NOTE — Telephone Encounter (Signed)
Thanks Dr. Gwenlyn Saran. I sent in the refill.   CGM MD

## 2015-03-11 NOTE — Telephone Encounter (Signed)
Refill sent. Thanks for the clarification regarding this medication.   CGM MD

## 2015-03-20 ENCOUNTER — Other Ambulatory Visit: Payer: Self-pay | Admitting: *Deleted

## 2015-03-20 MED ORDER — CARVEDILOL 6.25 MG PO TABS: ORAL_TABLET | ORAL | Status: DC

## 2015-03-27 ENCOUNTER — Other Ambulatory Visit: Payer: Self-pay | Admitting: Family Medicine

## 2015-03-30 ENCOUNTER — Other Ambulatory Visit: Payer: Self-pay | Admitting: Family Medicine

## 2015-03-31 NOTE — Telephone Encounter (Signed)
2nd request.  Chaye Misch L, RN  

## 2015-04-01 NOTE — Telephone Encounter (Signed)
3rd request. Martin, Tamika L, RN  

## 2015-04-02 MED ORDER — ACCU-CHEK AVIVA PLUS W/DEVICE KIT: PACK | Status: DC

## 2015-04-02 MED ORDER — PREGABALIN 100 MG PO CAPS: 100.0000 mg | ORAL_CAPSULE | Freq: Three times a day (TID) | ORAL | Status: DC

## 2015-04-02 MED ORDER — OMEPRAZOLE 20 MG PO CPDR: DELAYED_RELEASE_CAPSULE | ORAL | Status: DC

## 2015-04-02 NOTE — Telephone Encounter (Signed)
Failed transmission to pharmacy due to missing sig.  Resent Patrick Elliott, Patrick Elliott

## 2015-04-02 NOTE — Addendum Note (Signed)
Addended by: Christen Bame D on: 04/02/2015 09:02 AM   Modules accepted: Orders

## 2015-04-14 ENCOUNTER — Other Ambulatory Visit: Payer: Self-pay | Admitting: *Deleted

## 2015-04-14 MED ORDER — GLUCAGON (RDNA) 1 MG IJ KIT: PACK | INTRAMUSCULAR | Status: DC

## 2015-05-02 ENCOUNTER — Emergency Department
Admission: EM | Admit: 2015-05-02 | Discharge: 2015-05-03 | Disposition: A | Discharge status: Home / Self Care | Payer: BLUE CROSS/BLUE SHIELD | Attending: Emergency Medicine | Admitting: Emergency Medicine

## 2015-05-02 ENCOUNTER — Encounter: Payer: Self-pay | Admitting: Emergency Medicine

## 2015-05-02 DIAGNOSIS — Z87891 Personal history of nicotine dependence: Secondary | ICD-10-CM | POA: Insufficient documentation

## 2015-05-02 DIAGNOSIS — I10 Essential (primary) hypertension: Secondary | ICD-10-CM | POA: Diagnosis not present

## 2015-05-02 DIAGNOSIS — K088 Other specified disorders of teeth and supporting structures: Secondary | ICD-10-CM | POA: Diagnosis present

## 2015-05-02 DIAGNOSIS — K029 Dental caries, unspecified: Secondary | ICD-10-CM | POA: Insufficient documentation

## 2015-05-02 DIAGNOSIS — Z794 Long term (current) use of insulin: Secondary | ICD-10-CM | POA: Insufficient documentation

## 2015-05-02 DIAGNOSIS — Z79899 Other long term (current) drug therapy: Secondary | ICD-10-CM | POA: Insufficient documentation

## 2015-05-02 DIAGNOSIS — E1140 Type 2 diabetes mellitus with diabetic neuropathy, unspecified: Secondary | ICD-10-CM | POA: Diagnosis not present

## 2015-05-02 MED ORDER — PROMETHAZINE HCL 25 MG PO TABS: 25.0000 mg | ORAL_TABLET | Freq: Four times a day (QID) | ORAL | Status: DC | PRN

## 2015-05-02 MED ORDER — TRAMADOL HCL 50 MG PO TABS: 50.0000 mg | ORAL_TABLET | Freq: Four times a day (QID) | ORAL | Status: DC | PRN

## 2015-05-02 MED ORDER — OXYCODONE-ACETAMINOPHEN 5-325 MG PO TABS
1.0000 | ORAL_TABLET | Freq: Once | ORAL | Status: AC
  Administered 2015-05-03: 1 via ORAL
  Filled 2015-05-02: qty 1

## 2015-05-02 MED ORDER — AMOXICILLIN 500 MG PO CAPS: 500.0000 mg | ORAL_CAPSULE | Freq: Three times a day (TID) | ORAL | Status: DC

## 2015-05-02 MED ORDER — IBUPROFEN 800 MG PO TABS: 800.0000 mg | ORAL_TABLET | Freq: Three times a day (TID) | ORAL | Status: DC | PRN

## 2015-05-02 MED ORDER — PROMETHAZINE HCL 25 MG PO TABS
25.0000 mg | ORAL_TABLET | Freq: Once | ORAL | Status: AC
  Administered 2015-05-03: 25 mg via ORAL
  Filled 2015-05-02: qty 1

## 2015-05-02 MED ORDER — IBUPROFEN 800 MG PO TABS
800.0000 mg | ORAL_TABLET | Freq: Once | ORAL | Status: AC
  Administered 2015-05-03: 800 mg via ORAL
  Filled 2015-05-02: qty 1
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
## 2015-05-02 NOTE — Discharge Instructions (Signed)
OPTIONS FOR DENTAL FOLLOW UP CARE ° °[place] Department of Health and Human Services - Local Safety Net Dental Clinics °http://www.ncdhhs.gov/dph/oralhealth/services/safetynetclinics.htm °  °[facility] ([phone]) ° °[facility] ([phone]) ° °[facility] ([phone] ext [phone]) ° °[facility] ([phone]) ° °[facility] ([phone]) °This clinic caters to the indigent population and is on a lottery system. °Location: °[facility], [place], [address], [place] °Clinic Hours: °Wednesdays from 6pm - 9pm, patients seen by a lottery system. °For dates, call or go to [email or web address].[facility].edu/[facility]ac/patients/Dental-[facility] °Services: °Cleanings, fillings and simple extractions. °Payment Options: °DENTAL WORK IS FREE OF CHARGE. Bring proof of income or support. °Best way to get seen: °Arrive at 5:15 pm - this is a lottery, NOT first come/first serve, so arriving earlier will not increase your chances of being seen. °  °  °[facility] °[phone] °Select option 1 for emergencies °  °Location: °[facility] [place] [facility], [address], [place] °Clinic Hours: °No walk-ins accepted - call the day before to schedule an appointment. °Check in times are 9:30 am and 1:30 pm. °Services: °Simple extractions, temporary fillings, pulpectomy/pulp debridement, uncomplicated abscess drainage. °Payment Options: °PAYMENT IS DUE AT THE TIME OF SERVICE.  Fee is usually $100-200, additional surgical procedures (e.g. abscess drainage) may be extra. °Cash, checks, Visa/MasterCard accepted.  Can file Medicaid if patient is covered for dental - patient should call case worker to check. °No discount for [facility] patients. °Best way to get seen: °MUST call the day before and get onto the schedule. Can usually be seen the next 1-2 days. No walk-ins accepted. °  °  °[facility] °[phone] °   °Location: °[facility], [address], [place] °Clinic Hours: °M, W, Th, F 8am or 1:30pm, Tues 9a or 1:30 - first come/first served. °Services: °Simple extractions, temporary fillings, uncomplicated abscess drainage.  You do not need to be an [place] resident. °Payment Options: °PAYMENT IS DUE AT THE TIME OF SERVICE. °Dental insurance, otherwise sliding scale - bring proof of income or support. °Depending on income and treatment needed, cost is usually $50-200. °Best way to get seen: °Arrive early as it is first come/first served. °  °  °[facility] °[phone] °  °Location: °[address] °Clinic Hours: °Mon-Thu 8a-5p °Services: °Most basic dental services including extractions and fillings. °Payment Options: °PAYMENT IS DUE AT THE TIME OF SERVICE. °Sliding scale, up to 50% off - bring proof if income or support. °Medicaid with dental option accepted. °Best way to get seen: °Call to schedule an appointment, can usually be seen within 2 weeks OR they will try to see walk-ins - show up at 8a or 2p (you may have to wait). °  °  °[facility] °[phone] °[place] RESIDENTS ONLY °  °Location: °[facility], [address], [place], [place] [address] °Clinic Hours: By appointment only. °Monday - Thursday 8am-5pm, Friday 8am-12pm °Services: Cleanings, fillings, extractions. °Payment Options: °PAYMENT IS DUE AT THE TIME OF SERVICE. °Cash, Visa or MasterCard. Sliding scale - $30 minimum per service. °Best way to get seen: °Come in to office, complete packet and make an appointment - need proof of income °or support monies for each household member and proof of [place] residence. °Usually takes about a month to get in. °  °  °[facility] °[phone] °  °Location: °[address]   [place] °Clinic Hours: Walk-in Urgent Care Dental Services are offered Monday-Friday  mornings only. °The numbers of emergencies accepted daily is limited to the number of °providers available. °Maximum 15 - Mondays, Wednesdays & Thursdays °Maximum 10 - Tuesdays & Fridays °Services: °You do not need to be a [place] resident to be seen for a dental emergency. °Emergencies are defined as pain, swelling, abnormal bleeding, or dental trauma. Walkins will receive x-rays if needed. °NOTE: Dental cleaning is not an emergency. °Payment Options: °PAYMENT IS DUE AT THE TIME OF SERVICE. °Minimum co-pay is $40.00 for uninsured patients. °Minimum co-pay is $3.00 for Medicaid with dental coverage. °Dental Insurance is accepted and must be presented at time of visit. °Medicare does not cover dental. °Forms of payment: Cash, credit card, checks. °Best way to get seen: °If not previously registered with the clinic, walk-in dental registration begins at 7:15 am and is on a first come/first serve basis. °If previously registered with the clinic, call to make an appointment. °  °  °[facility] °[phone] °[place] RESIDENTS ONLY °  °Location: °[address], [place], [place] °Clinic Hours: °Mon-Thu 10a-2p °Services: Extractions only! °Payment Options: °FREE (donations accepted) - bring proof of income or support °Best way to get seen: °Call and schedule an appointment OR come at 8am on the 1st Monday of every month (except for holidays) when it is first come/first served. °  °  °[facility] °[phone] °  °Location: °[address], [place] °Clinic Hours: °Friday mornings °Services, Payment Options, Best way to get seen: °Call for info ° °Dental Pain °Toothache is pain in or around a tooth. It may get worse with chewing or with cold or heat.  °HOME CARE °· Your dentist may use a numbing medicine during treatment. If so, you may need to avoid eating until the medicine wears off. Ask your dentist about this. °· Only take medicine as told by your dentist or doctor. °· Avoid chewing food near  the painful tooth until after all treatment is done. Ask your dentist about this. °GET HELP RIGHT AWAY IF:  °· The problem gets worse or new problems appear. °· You have a fever. °· There is redness and puffiness (swelling) of the face, jaw, or neck. °· You cannot open your mouth. °· There is pain in the jaw. °· There is very bad pain that is not helped by medicine. °MAKE SURE YOU:  °· Understand these instructions. °· Will watch your condition. °· Will get help right away if you are not doing well or get worse. °Document Released: 01/25/2008 Document Revised: 10/31/2011 Document Reviewed: 01/25/2008 °ExitCare® Patient Information ©2015 [employer]. This information is not intended to replace advice given to you by your health care provider. Make sure you discuss any questions you have with your health care provider. ° °

## 2015-05-02 NOTE — ED Notes (Signed)
Patient reports left lower dental pain.

## 2015-05-03 DIAGNOSIS — K029 Dental caries, unspecified: Secondary | ICD-10-CM | POA: Diagnosis not present

## 2015-05-11 ENCOUNTER — Other Ambulatory Visit: Payer: Self-pay | Admitting: *Deleted

## 2015-05-20 MED ORDER — PREGABALIN 100 MG PO CAPS: 100.0000 mg | ORAL_CAPSULE | Freq: Three times a day (TID) | ORAL | Status: DC

## 2015-05-21 ENCOUNTER — Other Ambulatory Visit: Payer: Self-pay | Admitting: *Deleted

## 2015-05-21 MED ORDER — CARVEDILOL 6.25 MG PO TABS: ORAL_TABLET | ORAL | Status: DC

## 2015-05-27 ENCOUNTER — Ambulatory Visit (INDEPENDENT_AMBULATORY_CARE_PROVIDER_SITE_OTHER): Payer: BLUE CROSS/BLUE SHIELD | Admitting: Psychology

## 2015-05-27 DIAGNOSIS — F3175 Bipolar disorder, in partial remission, most recent episode depressed: Secondary | ICD-10-CM

## 2015-05-27 DIAGNOSIS — F900 Attention-deficit hyperactivity disorder, predominantly inattentive type: Secondary | ICD-10-CM | POA: Diagnosis not present

## 2015-05-27 NOTE — Patient Instructions (Signed)
Please schedule a follow-up for:  January 18th at 11:30. You may want to consider tracking gratitudes or anything that elevates (if even for a moment) your mood.  See how many checks you get.

## 2015-05-27 NOTE — Assessment & Plan Note (Signed)
Stable.  Function remains stable as well.  Requesting refills of Vyvanse.  Six months given by Dr. Tammi Klippel.

## 2015-05-27 NOTE — Progress Notes (Signed)
Patrick Elliott presents for follow-up.  He said that his "depression, memory, and attention" are all the same.  Not great but not worse than before.  He also mentioned that his "lack of interest in life" is stable as well.  He is unsure whether his lack of interest is tied to or feels separate than his depression.  Patrick Elliott wonders whether it is an artifact of aging.    He needs refills of his Vyvanse and Ambien.

## 2015-05-27 NOTE — Assessment & Plan Note (Signed)
Report of mood is stable.  No evidence of a downturn.  His affect actually seemed a bit brighter and he seemed more organized in his thinking.  He seemed to follow and appeared mildly receptive to a strategy for increasing his awareness of positive things in his life.  Discussed the concept of confirming and disconfirming evidence and how his mind might gravitate to one or the other depending on his mood.  See patient instructions for further plan.

## 2015-06-17 ENCOUNTER — Other Ambulatory Visit: Payer: Self-pay | Admitting: *Deleted

## 2015-06-18 MED ORDER — LISINOPRIL 20 MG PO TABS: ORAL_TABLET | ORAL | Status: DC

## 2015-06-21 ENCOUNTER — Other Ambulatory Visit: Payer: Self-pay | Admitting: Family Medicine

## 2015-07-08 ENCOUNTER — Telehealth: Payer: Self-pay | Admitting: Family Medicine

## 2015-07-08 MED ORDER — PREGABALIN 100 MG PO CAPS: 100.0000 mg | ORAL_CAPSULE | Freq: Three times a day (TID) | ORAL | Status: DC

## 2015-07-09 ENCOUNTER — Other Ambulatory Visit: Payer: Self-pay | Admitting: *Deleted

## 2015-07-10 NOTE — Telephone Encounter (Signed)
Lyrica is at the front desk ready for him to pick up. I have not been managing his Lithium as he is followed in Mood disorder clinic. Thanks  CGM MD

## 2015-07-10 NOTE — Telephone Encounter (Signed)
LM for patient to call back.  He just needs to pick up script for lyrica. Jazmin Hartsell,CMA

## 2015-07-13 NOTE — Telephone Encounter (Signed)
Patient needs refill of Xarelto sent to [facility] on [address]. Also wanted to check with MD to see if it was okay to start taking a vitamin for hair, skin, and nails.

## 2015-07-13 NOTE — Telephone Encounter (Signed)
Patient would like rx called into pharmacy. Rx called into [facility] per patient request.

## 2015-07-14 ENCOUNTER — Other Ambulatory Visit: Payer: Self-pay | Admitting: *Deleted

## 2015-07-14 NOTE — Telephone Encounter (Signed)
This patient is seen in mood disorder clinic. I do not manage his lithium. Please have him request refill at his next [facility] appointment with Dr. Tammi Klippel. Thanks  CGM MD

## 2015-07-14 NOTE — Telephone Encounter (Signed)
Patient called and medication called into pharmacy on their voicemail.

## 2015-07-14 NOTE — Telephone Encounter (Signed)
I have not prescribed him xarelto. I don't see this medication on his med list. If he received this medications somewhere else, he should contact the prescribing provider. Vitmains are ok. Thanks  CGM MD

## 2015-07-14 NOTE — Telephone Encounter (Signed)
Script called into pharmacy. Jazmin Hartsell,CMA  

## 2015-07-14 NOTE — Telephone Encounter (Signed)
Discussed with Dr. Tammi Klippel who authorized the refill.  Called pharmacy and left VM message authorizing.

## 2015-07-14 NOTE — Telephone Encounter (Signed)
Attempted to call pt, no answer will try again later. Deseree Blount, CMA  

## 2015-07-14 NOTE — Telephone Encounter (Signed)
Patient is aware of this. Patrick Elliott,CMA  

## 2015-07-15 NOTE — Telephone Encounter (Signed)
Thank you Dr. Gwenlyn Saran, and Dr. Tammi Klippel for taking care of this.    CGM MD

## 2015-07-27 ENCOUNTER — Other Ambulatory Visit: Payer: Self-pay | Admitting: *Deleted

## 2015-07-28 MED ORDER — OMEPRAZOLE 20 MG PO CPDR: DELAYED_RELEASE_CAPSULE | ORAL | Status: DC

## 2015-07-31 ENCOUNTER — Other Ambulatory Visit: Payer: Self-pay | Admitting: Family Medicine

## 2015-08-03 ENCOUNTER — Other Ambulatory Visit: Payer: Self-pay | Admitting: *Deleted

## 2015-08-04 NOTE — Telephone Encounter (Signed)
2nd request.  Jigar Zielke L, RN  

## 2015-08-05 ENCOUNTER — Telehealth: Payer: Self-pay | Admitting: Psychology

## 2015-08-05 MED ORDER — OMEPRAZOLE 20 MG PO CPDR: DELAYED_RELEASE_CAPSULE | ORAL | Status: DC

## 2015-08-05 NOTE — Telephone Encounter (Signed)
Dr. Minda Ditto received a refill request for Lamictal and Lithium and forwarded it to me for [place].  [name] is scheduled to see [place] 1/18.  I called to discuss and will consult with Dr. Tammi Klippel as needed.

## 2015-08-05 NOTE — Telephone Encounter (Signed)
3rd request. Akeelah Seppala L, RN  

## 2015-08-05 NOTE — Telephone Encounter (Signed)
Discussed with Dr. Gwenlyn Saran. Refilled Omeprazole, but Mood Disorder clinic will continue to manage his Lithium and Lamictal. Thanks  CGM MD

## 2015-08-06 ENCOUNTER — Other Ambulatory Visit: Payer: Self-pay | Admitting: *Deleted

## 2015-08-06 NOTE — Addendum Note (Signed)
Addended by: Geri Seminole on: 08/06/2015 09:24 AM   Modules accepted: Medications

## 2015-08-06 NOTE — Telephone Encounter (Signed)
Refills authorized by Dr. Tammi Klippel.  I called Lisa at [facility] in [place].  I left [name] a message yesterday and will await call back.

## 2015-08-12 MED ORDER — ACCU-CHEK FASTCLIX LANCETS MISC: Status: DC

## 2015-08-18 ENCOUNTER — Other Ambulatory Visit: Payer: Self-pay | Admitting: Family Medicine

## 2015-08-19 ENCOUNTER — Other Ambulatory Visit: Payer: Self-pay | Admitting: *Deleted

## 2015-08-19 MED ORDER — OMEPRAZOLE 20 MG PO CPDR: DELAYED_RELEASE_CAPSULE | ORAL | Status: DC

## 2015-08-20 NOTE — Telephone Encounter (Signed)
2nd request.  Martin, Tamika L, RN  

## 2015-08-20 NOTE — Telephone Encounter (Signed)
I think this was taken care of earlier this month.  See phone note dated 12/14.  I called [name] and left a VM to see if he was unable to get this medication.

## 2015-09-01 ENCOUNTER — Other Ambulatory Visit: Payer: Self-pay | Admitting: *Deleted

## 2015-09-01 MED ORDER — SIMVASTATIN 20 MG PO TABS: 20.0000 mg | ORAL_TABLET | Freq: Every day | ORAL | Status: DC

## 2015-09-08 ENCOUNTER — Other Ambulatory Visit: Payer: Self-pay | Admitting: *Deleted

## 2015-09-09 ENCOUNTER — Ambulatory Visit (INDEPENDENT_AMBULATORY_CARE_PROVIDER_SITE_OTHER): Payer: BLUE CROSS/BLUE SHIELD | Admitting: Psychology

## 2015-09-09 DIAGNOSIS — F3175 Bipolar disorder, in partial remission, most recent episode depressed: Secondary | ICD-10-CM

## 2015-09-09 DIAGNOSIS — F900 Attention-deficit hyperactivity disorder, predominantly inattentive type: Secondary | ICD-10-CM

## 2015-09-09 MED ORDER — METHOCARBAMOL 500 MG PO TABS: ORAL_TABLET | ORAL | Status: DC

## 2015-09-09 NOTE — Assessment & Plan Note (Signed)
His report of depressive episodes seem more sporadic and in shorter duration than past visits but it is hard to really get consistent details sometimes.  Treatment team has explored medications in the past and agree that with this particular report of "mood swings," mindfulness meditation might offer the best hope.  Discussed the basic premise and the idea that it is a skill that would require some practice.  Would likely notice a benefit after some significant practice but has perhaps the best opportunity to manage the type of mood issue he is currently reporting.    Dr. Tammi Klippel told Patrick Elliott that he does not expect Patrick Elliott to grow "tolerant" to the medicines he is on for his mood.  This was reportedly Patrick Elliott's major concern.

## 2015-09-09 NOTE — Patient Instructions (Signed)
Please schedule a follow-up for May 3rd at 11:00. We would be really excited about you trying mindfulness meditation.  Dr. Tammi Klippel recommended the [facility] App.  Good luck!

## 2015-09-09 NOTE — Progress Notes (Signed)
Patrick Elliott has an agenda to discuss depressed mood that "hits him" out of the blue and lasts a minute to an hour or two.  He is also wondering about long-term effects of medicine.  He thinks he gets about 4-6 hours of good attention with the Vyvanse and struggles after that.  He also reports that he is humming a lot.

## 2015-09-09 NOTE — Assessment & Plan Note (Addendum)
He is at the highest dose of Vyvanse.  Dr. Tammi Klippel recommended timing the medicine in order to get the most out of the 4-6 hours that it is aiding his focus.  Patrick Elliott voiced an understanding.  Dr. Tammi Klippel thinks the humming may be a tic as a result of the Vyvanse.  Patrick Elliott could test this by decreasing the dose or stopping it all together.  Patrick Elliott was not interested in doing this stating the humming doesn't really bother him.

## 2015-09-15 ENCOUNTER — Telehealth: Payer: Self-pay | Admitting: *Deleted

## 2015-09-15 NOTE — Telephone Encounter (Signed)
Called patient to offer flu vaccine, however, VM picked up.  Left message on patient's voice mail to return call. DUCATTE, LAURENZE L, RN   

## 2015-09-21 ENCOUNTER — Ambulatory Visit: Payer: BLUE CROSS/BLUE SHIELD | Admitting: Family Medicine

## 2015-10-14 ENCOUNTER — Other Ambulatory Visit: Payer: Self-pay | Admitting: Family Medicine

## 2015-10-15 ENCOUNTER — Telehealth: Payer: Self-pay | Admitting: Psychology

## 2015-10-15 ENCOUNTER — Encounter: Payer: Self-pay | Admitting: Family Medicine

## 2015-10-15 ENCOUNTER — Ambulatory Visit (INDEPENDENT_AMBULATORY_CARE_PROVIDER_SITE_OTHER): Payer: BLUE CROSS/BLUE SHIELD | Admitting: Family Medicine

## 2015-10-15 VITALS — BP 98/59 | HR 79 | Temp 98.4°F | Wt 185.5 lb

## 2015-10-15 DIAGNOSIS — E1140 Type 2 diabetes mellitus with diabetic neuropathy, unspecified: Secondary | ICD-10-CM

## 2015-10-15 LAB — POCT GLYCOSYLATED HEMOGLOBIN (HGB A1C): HEMOGLOBIN A1C: 9.4

## 2015-10-15 MED ORDER — INSULIN GLARGINE 100 UNIT/ML SOLOSTAR PEN: 15.0000 [IU] | PEN_INJECTOR | Freq: Every day | SUBCUTANEOUS | Status: DC

## 2015-10-15 MED ORDER — PREGABALIN 100 MG PO CAPS: 100.0000 mg | ORAL_CAPSULE | Freq: Three times a day (TID) | ORAL | Status: DC

## 2015-10-15 MED ORDER — ACCU-CHEK MULTICLIX LANCETS MISC: Status: DC

## 2015-10-15 MED ORDER — GLUCOSE BLOOD VI STRP: ORAL_STRIP | Status: DC

## 2015-10-15 NOTE — Patient Instructions (Signed)
Thanks for coming in today.   We will work on getting your blood sugar down.   Take the lantus 15 units daily and increase by 1-2 units each day. Increase 1 unit for every 50 of glucose over 150 on your glucometer readings.   Stop taking the humalog.   Continue taking the Victoza  I have sent in prescriptions for your other medications.   As far as your sinus symptoms, if you worsen or begin spiking fevers, then give [place] a call and I will call you in an antibiotic.   Thanks for letting us take care of you.   Sincerely, Paula Compton, MD Family Medicine -PGY 2

## 2015-10-15 NOTE — Telephone Encounter (Signed)
Patrick Elliott called to say he will be out of medicine in March.  I have him due for refills beginning April 4th for Vyvanse and Ambien.  Dr. Tammi Klippel is scheduled to be here next week.  Will ask him to write then if deemed appropriate.  We have follow-up scheduled for May with Patrick Elliott.

## 2015-10-19 ENCOUNTER — Telehealth: Payer: Self-pay | Admitting: Family Medicine

## 2015-10-19 NOTE — Telephone Encounter (Signed)
Would like lyrica called in. There is an envelope up front-could be the RX printed [facility]-15-15 [facility] [place]

## 2015-10-19 NOTE — Telephone Encounter (Signed)
Called script into lisa at pharmacy.  Paper script shredded. Jazmin Hartsell,CMA

## 2015-10-20 ENCOUNTER — Telehealth: Payer: Self-pay | Admitting: *Deleted

## 2015-10-20 NOTE — Progress Notes (Signed)
Patient ID: Patrick Elliott, male   DOB: March 19, 1961, 55 y.o.   MRN: [id number]   [facility] Aquilla Hacker, MD Phone: [phone]  Subjective:   # DMII  - pt. Here for follow up  - A1C is 9 - reports CBG's in 300 - 400 range.  - Has not changed his regimen and is very compliant.  - needs refills on needles, and lancets.  - On Novolog SSI taking max 20 units with meals.  - Also on Victoza 1.19m daily - Working on diet.  - + Polyuria and + polydypsia  Peripheral Neuropathy - better with lyrica - No other issues - No foot ulcerations - looks at his feet daily, and is otherwise very careful.    All relevant systems were reviewed and were negative unless otherwise noted in the HPI  Past Medical History Reviewed problem list.  Medications- reviewed and updated Current Outpatient Prescriptions  Medication Sig Dispense Refill  . amoxicillin (AMOXIL) 500 MG capsule Take 1 capsule (500 mg total) by mouth 3 (three) times daily. 30 capsule 0  . Blood Glucose Monitoring Suppl (ACCU-CHEK AVIVA PLUS) W/DEVICE KIT USE AS INSTRUCTED 1 kit 0  . Blood Glucose Monitoring Suppl (ACCU-CHEK AVIVA PLUS) W/DEVICE KIT USE AS INSTRUCTED 1 kit 0  . carvedilol (COREG) 6.25 MG tablet TAKE 1 TABLET BY MOUTH TWICE A DAY WITH A MEAL 60 tablet 3  . glucagon (GLUCAGON EMERGENCY) 1 MG injection INJECT PEN INTO THE MUSCLE ONCE AS NEEDED 1 kit 2  . glucose blood (ACCU-CHEK AVIVA PLUS) test strip USE 1 STRIP TO TEST 3 TIMES A DAY AS DIRECTED 100 each 5  . ibuprofen (ADVIL,MOTRIN) 800 MG tablet Take 1 tablet (800 mg total) by mouth every 8 (eight) hours as needed for moderate pain. 15 tablet 0  . insulin aspart (NOVOLOG) 100 UNIT/ML injection Inject 3-5 Units into the skin 3 (three) times daily before meals. (Patient taking differently: Inject 50-60 Units into the skin QID. ) 10 mL 11  . Insulin Glargine (LANTUS) 100 UNIT/ML Solostar Pen Inject 15 Units into the skin daily at 10 pm. Titrate  up by 1-2 units daily until am glucose readings reach 125 or less. 15 mL 11  . Insulin Pen Needle (BD PEN NEEDLE NANO U/F) 32G X 4 MM MISC USE AS DIRECTED TWICE A DAY WITH BYETTA 100 each 2  . Insulin Syringe-Needle U-100 (INSULIN SYRINGE .5CC/30GX5/16") 30G X 5/16" 0.5 ML MISC 4-5 Units by Does not apply route 3 (three) times daily with meals. 100 each 3  . lamoTRIgine (LAMICTAL) 25 MG tablet Take 4 tablets (100 mg total) by mouth daily. (Patient taking differently: Take 100 mg by mouth daily. Refills (11) authorized by Dr. MTammi Klippelon 08/06/15.  Called into pharmacy.) 120 tablet 2  . Lancets (ACCU-CHEK MULTICLIX) lancets Check sugar 3 x daily 204 each 5  . lisdexamfetamine (VYVANSE) 70 MG capsule Take 1 capsule (70 mg total) by mouth every morning. 30 capsule 0  . lisdexamfetamine (VYVANSE) 70 MG capsule Take 70 mg by mouth daily. [facility]  #[facility] with five refills.  Due for another script in October, 2016.    . lisdexamfetamine (VYVANSE) 70 MG capsule Take 70 mg by mouth daily. [facility]  #[facility] with five [facility]ills.  Due for new script in April.    .Marland Kitchenlisinopril (PRINIVIL,ZESTRIL) 20 MG tablet TAKE 1 TABLET (20 MG TOTAL) BY MOUTH DAILY. 30 tablet 6  . lithium  carbonate (ESKALITH) 450 MG CR tablet Take 1 tablet (450 mg total) by mouth daily. Take one pill daily with food. 30 tablet 11  . lithium carbonate (ESKALITH) 450 MG CR tablet Take 450 mg by mouth daily. 11 refills authorized by Dr. Tammi Klippel on 08/06/15.  Called into pharmacy.    . methocarbamol (ROBAXIN) 500 MG tablet TAKE 1 TABLET (500 MG TOTAL) BY MOUTH EVERY 8 (EIGHT) HOURS AS NEEDED FOR MUSCLE SPASMS. 30 tablet 5  . Multiple Vitamin (MULTIVITAMIN) capsule Take 1 capsule by mouth daily.      . nortriptyline (PAMELOR) 25 MG capsule TAKE 1 CAPSULE EVERY MORNING & 1 CAPSULE AT LUNCH AND 1 CAPSULE EVERY EVENING AT BEDTIME 180 capsule 4  . omeprazole (PRILOSEC) 20 MG capsule TAKE ONE CAPSULE BY MOUTH ONCE A DAY 90 capsule 2    . pregabalin (LYRICA) 100 MG capsule Take 1 capsule (100 mg total) by mouth 3 (three) times daily. 90 capsule 2  . promethazine (PHENERGAN) 25 MG tablet Take 1 tablet (25 mg total) by mouth every 6 (six) hours as needed for nausea or vomiting. 30 tablet 0  . simvastatin (ZOCOR) 20 MG tablet Take 1 tablet (20 mg total) by mouth daily. 90 tablet 3  . tadalafil (CIALIS) 20 MG tablet Take 5 mg by mouth as needed.      . traMADol (ULTRAM) 50 MG tablet Take 1 tablet (50 mg total) by mouth every 6 (six) hours as needed. 20 tablet 0  . VICTOZA 18 MG/3ML SOPN INJECT 0.3 MLS (1.8 MG TOTAL) INTO THE SKIN DAILY. 9 pen 3  . zolpidem (AMBIEN) 10 MG tablet Take 10 mg by mouth at bedtime as needed for sleep. Per Dr. Tammi Klippel in [facility].  #30 with 5 refills.  Due for more scripts: April, 2017    . [DISCONTINUED] HUMALOG 100 UNIT/ML injection INJECT 2-5 UNITS INTO THE SKIN 3 TIMES A DAY BEFORE MEALS 10 mL 3   No current facility-administered medications for this visit.   Chief complaint-noted No additions to family history Social history- patient is a non smoker  Objective: BP 98/59 mmHg  Pulse 79  Temp(Src) 98.4 F (36.9 C) (Oral)  Wt 185 lb 8 oz (84.142 kg) Gen: NAD, alert, cooperative with exam HEENT: NCAT, EOMI, PERRL Neck: FROM, supple CV: RRR, good S1/S2, no murmur Resp: CTABL, no wheezes, non-labored Abd: SNTND, BS present, no guarding or organomegaly Ext: No edema, warm, normal tone, moves UE/LE spontaneously Neuro: Alert and oriented, No gross deficits Skin: no rashes no lesions  Assessment/Plan:  # DMII  - becoming less controlled. Likely worsening B cell function.  - Adjusted to lantus 15 u and titrate up as needed.  - D/C'd Novolog SSI - Continue Victoza - May need to add back Novolog at next visit.  - He is working on eating 3 meals daily in order to spread out glucose peaks.  - His sleep schedule certainly makes things difficult as well, since he is taking his morning insulin at Noon,  evening insulin at 10, but not going to bed until 3 am.  - Lifestyle modification needed.  - Will see him back in 2 weeks to see where his CBG's are.  - Need to get his A1C down.   # Peripheral Neuropathy  - continue Lyrica - Well controlled.

## 2015-10-20 NOTE — Telephone Encounter (Signed)
Called patient to offer flu vaccine. [facility] Added to historical immunization record. Velora Heckler, RN

## 2015-10-29 ENCOUNTER — Ambulatory Visit: Payer: BLUE CROSS/BLUE SHIELD | Admitting: Family Medicine

## 2015-10-30 ENCOUNTER — Other Ambulatory Visit: Payer: Self-pay | Admitting: *Deleted

## 2015-10-30 MED ORDER — GLUCAGON (RDNA) 1 MG IJ KIT: PACK | INTRAMUSCULAR | Status: DC

## 2015-11-04 NOTE — Telephone Encounter (Signed)
Called Patrick Elliott to let him know the scripts were up front.  We are scheduled to see him back in May.

## 2015-11-04 NOTE — Addendum Note (Signed)
Addended by: Geri Seminole on: 11/04/2015 10:58 AM   Modules accepted: Medications

## 2015-11-09 ENCOUNTER — Other Ambulatory Visit: Payer: Self-pay | Admitting: *Deleted

## 2015-11-10 ENCOUNTER — Encounter: Payer: Self-pay | Admitting: Pharmacist

## 2015-11-10 ENCOUNTER — Ambulatory Visit (INDEPENDENT_AMBULATORY_CARE_PROVIDER_SITE_OTHER): Payer: BLUE CROSS/BLUE SHIELD | Admitting: Family Medicine

## 2015-11-10 ENCOUNTER — Ambulatory Visit (INDEPENDENT_AMBULATORY_CARE_PROVIDER_SITE_OTHER): Payer: BLUE CROSS/BLUE SHIELD | Admitting: Pharmacist

## 2015-11-10 VITALS — BP 150/93 | HR 109 | Wt 188.2 lb

## 2015-11-10 DIAGNOSIS — I10 Essential (primary) hypertension: Secondary | ICD-10-CM

## 2015-11-10 DIAGNOSIS — E1140 Type 2 diabetes mellitus with diabetic neuropathy, unspecified: Secondary | ICD-10-CM

## 2015-11-10 DIAGNOSIS — Z794 Long term (current) use of insulin: Secondary | ICD-10-CM | POA: Diagnosis not present

## 2015-11-10 DIAGNOSIS — E1165 Type 2 diabetes mellitus with hyperglycemia: Secondary | ICD-10-CM | POA: Diagnosis not present

## 2015-11-10 DIAGNOSIS — E1141 Type 2 diabetes mellitus with diabetic mononeuropathy: Secondary | ICD-10-CM | POA: Diagnosis not present

## 2015-11-10 DIAGNOSIS — IMO0002 Reserved for concepts with insufficient information to code with codable children: Secondary | ICD-10-CM

## 2015-11-10 MED ORDER — INSULIN ASPART 100 UNIT/ML ~~LOC~~ SOLN: 50.0000 [IU] | Freq: Four times a day (QID) | SUBCUTANEOUS | Status: DC

## 2015-11-10 NOTE — Assessment & Plan Note (Signed)
Hypertension longstanding currently greater than goal with an elevated pulse.  Patient reports adherence with medication. Control is suboptimal due to stress of poor DM control.  Reevaluate BP at next visit and consider increase in Carvedilol.

## 2015-11-10 NOTE — Patient Instructions (Addendum)
Thank you for coming to your appointment today!  Increase Victoza to 1.8mg  daily Decrease Lantus to 18 units daily  Change meal time Novolog Lunch:  80-150: 3 units >150: 5 units  Dinner: 80-150: 5 units >150 or above: 7 units  Follow-up with pharmacy clinic in the next 3 weeks, make an appointment earlier if needed.

## 2015-11-10 NOTE — Assessment & Plan Note (Signed)
Diabetes longstanding currently uncontrolled. Patient reports hypoglycemic events and is able to verbalize appropriate hypoglycemia management plan. Patient reports adherence with medication. Control is suboptimal due to suboptimal medication dosing. Decreased dose of basal insulin Lantus (insulin glargine) to 18 units daily. Increase rapid insulin Novolog (insulin aspart) for lunch and dinner. Lunch: 80-150: 3 units, >150: 5 units and Dinner: 80-150: 5 units, >150 or above: 7 units. Increased dose of Victoza (liraglutide) to 1.8mg  daily. Next A1C anticipated May 2017.

## 2015-11-10 NOTE — Progress Notes (Signed)
S:    Patient arrives in pleasant mood ambulating by himself.  Presents for diabetes evaluation, education, and management at the request of Dr. Minda Ditto. Patient was last seen by Primary Care Provider on 10/15/15.  Well known in pharmacy clinic as we have worked together many times in the past.   Patient reports Diabetes was diagnosed in 2008/2009.    Patient reports adherence with medications.  Current diabetes medications include: Lantus, Victoza, Novolog -Patient reported following last visit that he did not increase his Victoza to 1.8mg , reports currently using 1.2mg  daily Current hypertension medications include: carvedilol, lisinopril    Patient reports hypoglycemic events. Typically occur in the mornings and late night.   Patient reported dietary habits: Eats 3 meals/day, vegetarian diet  Breakfast: yogurt  Lunch: yogurt or half sandwich Dinner: salad or pasta, veggies, tofu Snacks: apple sauce, yogurt, eggs Drinks: water, diet coke, coffee  Patient reported exercise habits: walk 2-3 per week   Patient denies nocturia.  Patient reports neuropathy. Patient denies visual changes. Patient reports self foot exams.    O:  Lab Results  Component Value Date   HGBA1C 9.4 10/15/2015   Filed Vitals:   11/10/15 1540  BP: 150/93  Pulse: 109    Home readings: 85-373 over a 2 week time span per Accu-Chek report.  Readings in the lower end of that range in last week   A/P: Diabetes longstanding currently uncontrolled. Patient reports hypoglycemic events and is able to verbalize appropriate hypoglycemia management plan. Patient reports adherence with medication. Control is suboptimal due to suboptimal medication dosing. Decreased dose of basal insulin Lantus (insulin glargine) to 18 units daily. Increase rapid insulin Novolog (insulin aspart) for lunch and dinner. Lunch: 80-150: 3 units, >150: 5 units and Dinner: 80-150: 5 units, >150 or above: 7 units. Increased dose of Victoza  (liraglutide) to 1.8mg  daily. Next A1C anticipated May 2017.    Hypertension longstanding currently greater than goal with an elevated pulse.  Patient reports adherence with medication. Control is suboptimal due to stress of poor DM control.  Reevaluate BP at next visit and consider increase in Carvedilol.   Written patient instructions provided. Total time in face to face counseling 60 minutes.   Follow up in Pharmacist Clinic Visit in 3 weeks or sooner if needed. Patient seen with Rudolpho Sevin, PharmD Candidate, and Nuala Alpha, PharmD Resident.

## 2015-11-11 NOTE — Patient Instructions (Signed)
Diabetes Mellitus and Food It is important for you to manage your blood sugar (glucose) level. Your blood glucose level can be greatly affected by what you eat. Eating healthier foods in the appropriate amounts throughout the day at about the same time each day will help you control your blood glucose level. It can also help slow or prevent worsening of your diabetes mellitus. Healthy eating may even help you improve the level of your blood pressure and reach or maintain a healthy weight.  General recommendations for healthful eating and cooking habits include:  Eating meals and snacks regularly. Avoid going long periods of time without eating to lose weight.  Eating a diet that consists mainly of plant-based foods, such as fruits, vegetables, nuts, legumes, and whole grains.  Using low-heat cooking methods, such as baking, instead of high-heat cooking methods, such as deep frying. Work with your dietitian to make sure you understand how to use the Nutrition Facts information on food labels. HOW CAN FOOD AFFECT ME? Carbohydrates Carbohydrates affect your blood glucose level more than any other type of food. Your dietitian will help you determine how many carbohydrates to eat at each meal and teach you how to count carbohydrates. Counting carbohydrates is important to keep your blood glucose at a healthy level, especially if you are using insulin or taking certain medicines for diabetes mellitus. Alcohol Alcohol can cause sudden decreases in blood glucose (hypoglycemia), especially if you use insulin or take certain medicines for diabetes mellitus. Hypoglycemia can be a life-threatening condition. Symptoms of hypoglycemia (sleepiness, dizziness, and disorientation) are similar to symptoms of having too much alcohol.  If your health care provider has given you approval to drink alcohol, do so in moderation and use the following guidelines:  Women should not have more than one drink per day, and men  should not have more than two drinks per day. One drink is equal to:  12 oz of beer.  5 oz of wine.  1 oz of hard liquor.  Do not drink on an empty stomach.  Keep yourself hydrated. Have water, diet soda, or unsweetened iced tea.  Regular soda, juice, and other mixers might contain a lot of carbohydrates and should be counted. WHAT FOODS ARE NOT RECOMMENDED? As you make food choices, it is important to remember that all foods are not the same. Some foods have fewer nutrients per serving than other foods, even though they might have the same number of calories or carbohydrates. It is difficult to get your body what it needs when you eat foods with fewer nutrients. Examples of foods that you should avoid that are high in calories and carbohydrates but low in nutrients include:  Trans fats (most processed foods list trans fats on the Nutrition Facts label).  Regular soda.  Juice.  Candy.  Sweets, such as cake, pie, doughnuts, and cookies.  Fried foods. WHAT FOODS CAN I EAT? Eat nutrient-rich foods, which will nourish your body and keep you healthy. The food you should eat also will depend on several factors, including:  The calories you need.  The medicines you take.  Your weight.  Your blood glucose level.  Your blood pressure level.  Your cholesterol level. You should eat a variety of foods, including:  Protein.  Lean cuts of meat.  Proteins low in saturated fats, such as fish, egg whites, and beans. Avoid processed meats.  Fruits and vegetables.  Fruits and vegetables that may help control blood glucose levels, such as apples, mangoes, and   yams.  Dairy products.  Choose fat-free or low-fat dairy products, such as milk, yogurt, and cheese.  Grains, bread, pasta, and rice.  Choose whole grain products, such as multigrain bread, whole oats, and brown rice. These foods may help control blood pressure.  Fats.  Foods containing healthful fats, such as nuts,  avocado, olive oil, canola oil, and fish. DOES EVERYONE WITH DIABETES MELLITUS HAVE THE SAME MEAL PLAN? Because every person with diabetes mellitus is different, there is not one meal plan that works for everyone. It is very important that you meet with a dietitian who will help you create a meal plan that is just right for you.   This information is not intended to replace advice given to you by your health care provider. Make sure you discuss any questions you have with your health care provider.   Document Released: 05/05/2005 Document Revised: 08/29/2014 Document Reviewed: 07/05/2013 Elsevier Interactive Patient Education 2016 [employer]  

## 2015-11-11 NOTE — Progress Notes (Signed)
Patient ID: Patrick Elliott, male   DOB: Jun 14, 1961, 55 y.o.   MRN: [id number]    [facility] Patrick Hacker, MD Phone: [phone]  Subjective:   # DMII follow up - has been following up with Patrick Elliott in the interim.  - really motivated to get his A1C down.  - he has continued to have elevated CBG's despite adjustment in therapy. Elevated CBG is predominantly at night.  - has been working on his diet.  - no complaints of polyuria / polydypsia - no significant hypoglycemic events.  - when his CBG gets around 250 he begins to feel bad, he will have blurry vision, and feel lethargic.  - CBG print out today with numbers ranging from 120's - 150's.  - neuropathy has improved very little.    All relevant systems were reviewed and were negative unless otherwise noted in the HPI  Past Medical History Reviewed problem list.  Medications- reviewed and updated Current Outpatient Prescriptions  Medication Sig Dispense Refill  . amoxicillin (AMOXIL) 500 MG capsule Take 1 capsule (500 mg total) by mouth 3 (three) times daily. (Patient not taking: Reported on 11/10/2015) 30 capsule 0  . Blood Glucose Monitoring Suppl (ACCU-CHEK AVIVA PLUS) W/DEVICE KIT USE AS INSTRUCTED 1 kit 0  . carvedilol (COREG) 6.25 MG tablet TAKE 1 TABLET BY MOUTH TWICE A DAY WITH A MEAL 60 tablet 3  . glucagon (GLUCAGON EMERGENCY) 1 MG injection INJECT PEN INTO THE MUSCLE ONCE AS NEEDED (Patient not taking: Reported on 11/10/2015) 1 kit 2  . glucose blood (ACCU-CHEK AVIVA PLUS) test strip USE 1 STRIP TO TEST 3 TIMES A DAY AS DIRECTED 100 each 5  . ibuprofen (ADVIL,MOTRIN) 800 MG tablet Take 1 tablet (800 mg total) by mouth every 8 (eight) hours as needed for moderate pain. 15 tablet 0  . insulin aspart (NOVOLOG) 100 UNIT/ML injection Inject 3-7 Units into the skin 2 (two) times daily with a meal. 10 mL 11  . Insulin Glargine (LANTUS) 100 UNIT/ML Solostar Pen Inject 18 Units into the skin daily at 10  pm. 15 mL 11  . Insulin Pen Needle (BD PEN NEEDLE NANO U/F) 32G X 4 MM MISC USE AS DIRECTED TWICE A DAY WITH BYETTA 100 each 2  . Insulin Syringe-Needle U-100 (INSULIN SYRINGE .5CC/30GX5/16") 30G X 5/16" 0.5 ML MISC 4-5 Units by Does not apply route 3 (three) times daily with meals. 100 each 3  . lamoTRIgine (LAMICTAL) 25 MG tablet Take 4 tablets (100 mg total) by mouth daily. (Patient taking differently: Take 100 mg by mouth daily. Refills (11) authorized by Patrick Elliott on 08/06/15.  Called into pharmacy.) 120 tablet 2  . Lancets (ACCU-CHEK MULTICLIX) lancets Check sugar 3 x daily 204 each 5  . Liraglutide (VICTOZA) 18 MG/3ML SOPN INJECT 0.3 MLS (1.8 MG TOTAL) INTO THE SKIN DAILY. 9 pen 3  . lisdexamfetamine (VYVANSE) 70 MG capsule Take 1 capsule (70 mg total) by mouth every morning. (Patient taking differently: Take 70 mg by mouth every morning. #30 with 1 refill. Scripts dated:  11/04/15 and 12/05/15.  Per Patrick Elliott.) 30 capsule 0  . lisdexamfetamine (VYVANSE) 70 MG capsule Take 70 mg by mouth daily. Per Patrick Elliott in [facility].  #30 with five refills.  Due for another script in October, 2016.    . lisdexamfetamine (VYVANSE) 70 MG capsule Take 70 mg by mouth daily. Per. Patrick Elliott in [facility].  #30 with five refills.  Due for new script in  April.    . lisinopril (PRINIVIL,ZESTRIL) 20 MG tablet TAKE 1 TABLET (20 MG TOTAL) BY MOUTH DAILY. 30 tablet 6  . lithium carbonate (ESKALITH) 450 MG CR tablet Take 1 tablet (450 mg total) by mouth daily. Take one pill daily with food. 30 tablet 11  . methocarbamol (ROBAXIN) 500 MG tablet TAKE 1 TABLET (500 MG TOTAL) BY MOUTH EVERY 8 (EIGHT) HOURS AS NEEDED FOR MUSCLE SPASMS. 30 tablet 5  . Multiple Vitamin (MULTIVITAMIN) capsule Take 1 capsule by mouth daily.      . nortriptyline (PAMELOR) 25 MG capsule TAKE 1 CAPSULE EVERY MORNING & 1 CAPSULE AT LUNCH AND 1 CAPSULE EVERY EVENING AT BEDTIME 180 capsule 4  . omeprazole (PRILOSEC) 20 MG capsule TAKE ONE CAPSULE BY MOUTH  ONCE A DAY 90 capsule 2  . pregabalin (LYRICA) 100 MG capsule Take 1 capsule (100 mg total) by mouth 3 (three) times daily. 90 capsule 2  . simvastatin (ZOCOR) 20 MG tablet Take 1 tablet (20 mg total) by mouth daily. 90 tablet 3  . tadalafil (CIALIS) 20 MG tablet Take 5 mg by mouth as needed.      . zolpidem (AMBIEN) 10 MG tablet Take 10 mg by mouth at bedtime as needed for sleep. Per Patrick Elliott in [facility].  #30 with 2 refills.  Due for more scripts 6/15.    . [DISCONTINUED] HUMALOG 100 UNIT/ML injection INJECT 2-5 UNITS INTO THE SKIN 3 TIMES A DAY BEFORE MEALS 10 mL 3   No current facility-administered medications for this visit.   Chief complaint-noted No additions to family history Social history- patient is a non smoker  Objective: There were no vitals taken for this visit. Gen: NAD, alert, cooperative with exam HEENT: NCAT, EOMI, PERRL Neck: FROM, supple CV: RRR, good S1/S2, no murmur Resp: CTABL, no wheezes, non-labored Abd: SNTND, BS present, no guarding or organomegaly Ext: No edema, warm, normal tone, moves UE/LE spontaneously Neuro: Alert and oriented, No gross deficits Skin: no rashes no lesions  Assessment/Plan:  # DMII - he continues to have difficulty controlling his blood glucose. Last A1C one month ago. Continuing to try to bring it down. Seen by Patrick Elliott and pharm team today. Discussed adjustment in therapy and agree with their plan.  - Will increase Victoza to 1.8 - Increase SSI Novolog around meal times. - Reduce Lantus at night as lows have been predominantly in the evenings.  - A1C in 2 months.  - F/U with Patrick Elliott in 3 weeks.  - F/U with me in 6 weeks.  - Will continue to follow along.  - He is very motivated. Once we find the right balance of medicines for him, I am hopeful we will get him under much better control.

## 2015-11-11 NOTE — Progress Notes (Signed)
Patient ID: Patrick Elliott, male   DOB: 08/20/1961, 54 y.o.   MRN: [id number] Reviewed: Agree with Dr. Koval's documentation and management. 

## 2015-12-15 ENCOUNTER — Other Ambulatory Visit: Payer: Self-pay | Admitting: *Deleted

## 2015-12-15 DIAGNOSIS — E1140 Type 2 diabetes mellitus with diabetic neuropathy, unspecified: Secondary | ICD-10-CM

## 2015-12-15 MED ORDER — GLUCOSE BLOOD VI STRP: ORAL_STRIP | Status: DC

## 2015-12-17 ENCOUNTER — Other Ambulatory Visit: Payer: Self-pay | Admitting: Family Medicine

## 2015-12-18 ENCOUNTER — Other Ambulatory Visit: Payer: Self-pay | Admitting: *Deleted

## 2015-12-18 MED ORDER — NORTRIPTYLINE HCL 25 MG PO CAPS: ORAL_CAPSULE | ORAL | Status: DC

## 2015-12-18 MED ORDER — CARVEDILOL 6.25 MG PO TABS: ORAL_TABLET | ORAL | Status: DC

## 2015-12-18 MED ORDER — METHOCARBAMOL 500 MG PO TABS: ORAL_TABLET | ORAL | Status: DC

## 2015-12-18 NOTE — Telephone Encounter (Signed)
Refilled medications.   Archie Patten, MD [facility] Resident  12/18/2015, 8:54 AM

## 2015-12-18 NOTE — Telephone Encounter (Signed)
Informed pt his medications were refilled.

## 2015-12-21 ENCOUNTER — Other Ambulatory Visit: Payer: Self-pay | Admitting: *Deleted

## 2015-12-21 NOTE — Telephone Encounter (Signed)
[facility] Author[facility] for Accu-Chek Aviva Plus test strips.  PA form placed in provider box for completion. Derl Barrow, RN

## 2015-12-22 NOTE — Telephone Encounter (Addendum)
PA completed online at [email or web address].  Review process could take up to 24-72 hours to complete.  Derl Barrow, RN  PA denied via [place] [employer] [place].  Per patient's insurance they will cover [employer] and Contour next test strips.  [employer] are patient's preferred diabetic supplies.  Please send in new [employer] Next test strips and lancets.  Reference number: [id number].    Derl Barrow, RN

## 2015-12-23 ENCOUNTER — Ambulatory Visit (INDEPENDENT_AMBULATORY_CARE_PROVIDER_SITE_OTHER): Payer: BLUE CROSS/BLUE SHIELD | Admitting: Psychology

## 2015-12-23 DIAGNOSIS — F3175 Bipolar disorder, in partial remission, most recent episode depressed: Secondary | ICD-10-CM | POA: Diagnosis not present

## 2015-12-23 DIAGNOSIS — F900 Attention-deficit hyperactivity disorder, predominantly inattentive type: Secondary | ICD-10-CM

## 2015-12-23 NOTE — Progress Notes (Signed)
Reason for follow-up:  Patrick Elliott presents for continued mood management and medication refills.    Issues discussed:  DM.  His A1C is trending up and his neuropathy is worth.  This bothers him.  He has a follow-up scheduled with Dr. Valentina Lucks.  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  He does not want to decrease this medicine.  He is finding meaning in taking care of his dogs and cats, his relationship with [name], her daughter, and today he is driving around his mother and her brother.  This is reasonably fun.  Identified goals:  Continued acceptance of his many health issues and the impact on his function with a focus on gratitude and doing what he can do to lead a meaningful life.

## 2015-12-23 NOTE — Assessment & Plan Note (Signed)
Affect is brighter than we remember - he smiled brightly on several occasions and actively talked about what is going well in his life.  He thinks mood issues continue to be shorter in duration and less in frequency but bothersome when they do present.  He thinks (as we have) that we have captured what we can via medication.  He may be growing in acceptance of mild to moderate mood variations and learning to ride them out as opposed to catastrophize over them.    Dr. Tammi Klippel wrote meds so that he should be good through December.  He would like to come back in three months.  Scheduled that appointment for August.

## 2015-12-23 NOTE — Patient Instructions (Signed)
Please schedule a follow-up:  August 2nd at 11:00. Dr. Tammi Klippel wrote scripts today that should last you for six months.

## 2015-12-23 NOTE — Assessment & Plan Note (Signed)
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  No other reported side effects. Patient reports significant benefit from the medicine.  6 months of medicine provided.

## 2015-12-24 NOTE — Telephone Encounter (Signed)
Patient calling and would new [employer] and supplies sent to pharmacy today if possible as he is out of supplies. [facility]

## 2015-12-29 MED ORDER — GLUCOSE BLOOD VI DISK: 1.0000 | DISK | Freq: Every day | Status: DC

## 2015-12-29 MED ORDER — GLUCOSE BLOOD VI STRP: ORAL_STRIP | Status: DC

## 2015-12-29 NOTE — Addendum Note (Signed)
Addended by: Aquilla Hacker on: 12/29/2015 02:01 PM   Modules accepted: Orders

## 2015-12-29 NOTE — Telephone Encounter (Signed)
Sent in Rx  CGM MD

## 2015-12-31 ENCOUNTER — Telehealth: Payer: Self-pay | Admitting: Family Medicine

## 2015-12-31 NOTE — Telephone Encounter (Signed)
Patient states the rx for test strip sent in does not allow to test as many times a day as Him and Dr. Minda Ditto discussed.   Also, the Victoza is leveling pt's blood sugars but not really lowering them. Reading still in the 200s all the time. Please advise.

## 2015-12-31 NOTE — Telephone Encounter (Signed)
Will forward to MD.  

## 2016-01-01 ENCOUNTER — Other Ambulatory Visit: Payer: Self-pay | Admitting: *Deleted

## 2016-01-01 MED ORDER — METHOCARBAMOL 500 MG PO TABS: ORAL_TABLET | ORAL | Status: DC

## 2016-01-01 NOTE — Telephone Encounter (Signed)
Should schedule an appointment. I had wanted him to f/u in 6 weeks at our last appointment at the end of March. Can send in more test strips then.   CGM MD

## 2016-01-05 NOTE — Telephone Encounter (Signed)
Left message for pt to return call to schedule apt. Page, cma.

## 2016-01-12 ENCOUNTER — Other Ambulatory Visit: Payer: Self-pay | Admitting: *Deleted

## 2016-01-13 MED ORDER — NORTRIPTYLINE HCL 25 MG PO CAPS: ORAL_CAPSULE | ORAL | Status: DC

## 2016-01-13 MED ORDER — LISINOPRIL 20 MG PO TABS: ORAL_TABLET | ORAL | Status: DC

## 2016-01-13 MED ORDER — METHOCARBAMOL 500 MG PO TABS: ORAL_TABLET | ORAL | Status: DC

## 2016-01-13 MED ORDER — INSULIN ASPART 100 UNIT/ML ~~LOC~~ SOLN
3.0000 [IU] | Freq: Two times a day (BID) | SUBCUTANEOUS | Status: DC
Start: 2016-01-13 — End: 2016-02-29

## 2016-01-13 MED ORDER — "INSULIN SYRINGE 30G X 5/16"" 0.5 ML MISC": 4.0000 [IU] | Freq: Three times a day (TID) | Status: DC

## 2016-01-13 NOTE — Telephone Encounter (Signed)
Refilled and sent to his pharmacy on 5/9. See the chart. I'm not sure why they are not getting it. I've refilled them twice in the past two months. Will refill again here.   CGM MD

## 2016-01-13 NOTE — Telephone Encounter (Signed)
Pt called because he had not received his refills on his medications, strips,. He made an appointment for 01/26/16, but he is out now and needs the refills. Can we send in enough of his strips, medications to get him through until his appointment. All medications and strips are pending. jw

## 2016-01-13 NOTE — Telephone Encounter (Signed)
Called and spoke with pharmacy and they did receive medication refills for this patient.  They have an active script for this patient for an accu check meter and strips.  I informed the pharmacy that patient requested the bayer contour meter and supplies a month ago and was now only requesting the strips.  LM for patient to call back and confirm which meter he is currently using and which strips he is needing so we can get the appropriate ones called in. [facility]

## 2016-01-20 ENCOUNTER — Other Ambulatory Visit: Payer: Self-pay | Admitting: *Deleted

## 2016-01-20 DIAGNOSIS — E1140 Type 2 diabetes mellitus with diabetic neuropathy, unspecified: Secondary | ICD-10-CM

## 2016-01-20 MED ORDER — GLUCOSE BLOOD VI STRP: ORAL_STRIP | Status: DC

## 2016-01-20 NOTE — Telephone Encounter (Signed)
Pt needs a rx for 6 test strips a day instead of 3 a day. Please advise. Deseree Kennon Holter, CMA

## 2016-01-26 ENCOUNTER — Encounter: Payer: Self-pay | Admitting: Family Medicine

## 2016-01-26 ENCOUNTER — Other Ambulatory Visit: Payer: Self-pay | Admitting: *Deleted

## 2016-01-26 ENCOUNTER — Telehealth: Payer: Self-pay | Admitting: *Deleted

## 2016-01-26 ENCOUNTER — Ambulatory Visit (INDEPENDENT_AMBULATORY_CARE_PROVIDER_SITE_OTHER): Payer: BLUE CROSS/BLUE SHIELD | Admitting: Family Medicine

## 2016-01-26 VITALS — BP 151/74 | HR 72 | Temp 98.3°F | Wt 197.0 lb

## 2016-01-26 DIAGNOSIS — E1140 Type 2 diabetes mellitus with diabetic neuropathy, unspecified: Secondary | ICD-10-CM

## 2016-01-26 DIAGNOSIS — Z79899 Other long term (current) drug therapy: Secondary | ICD-10-CM

## 2016-01-26 LAB — POCT GLYCOSYLATED HEMOGLOBIN (HGB A1C): Hemoglobin A1C: 7.9

## 2016-01-26 MED ORDER — GLUCOSE BLOOD VI STRP: ORAL_STRIP | Status: DC

## 2016-01-26 MED ORDER — PREGABALIN 100 MG PO CAPS: 100.0000 mg | ORAL_CAPSULE | Freq: Three times a day (TID) | ORAL | Status: DC

## 2016-01-26 MED ORDER — ACCU-CHEK MULTICLIX LANCETS MISC: Status: DC

## 2016-01-26 NOTE — Progress Notes (Signed)
Patient ID: Patrick Elliott, male   DOB: Jun 11, 1961, 55 y.o.   MRN: [id number]   [facility] Aquilla Hacker, MD Phone: [phone]  Subjective:   # DMII - A1C is much better today.  - Have been having issues getting strips with blood glucose monitor.  - feels that the novolog is dropping him significantly when he takes it.  - Morning CBG's 180-200, Using Novolog 3-7 units.  - Has not had any frank hypoglycemia.  - He gets symptomatic at around CBG of 125-140.  - Afraid to be out without any food and becoming symptomatic with insulin.  - Not missing any doses of insulin.  - Working on diet.   # Long term use of Lithium  - mood stabilizer - is effective for him.  - no palpitations, no constipation or diarrhea, heat or cold intolerance.   All relevant systems were reviewed and were negative unless otherwise noted in the HPI  Past Medical History Reviewed problem list.  Medications- reviewed and updated Current Outpatient Prescriptions  Medication Sig Dispense Refill  . amoxicillin (AMOXIL) 500 MG capsule Take 1 capsule (500 mg total) by mouth 3 (three) times daily. (Patient not taking: Reported on 11/10/2015) 30 capsule 0  . Blood Glucose Monitoring Suppl (ACCU-CHEK AVIVA PLUS) W/DEVICE KIT USE AS INSTRUCTED 1 kit 0  . carvedilol (COREG) 6.25 MG tablet TAKE 1 TABLET BY MOUTH TWICE A DAY WITH A MEAL 60 tablet 0  . glucagon (GLUCAGON EMERGENCY) 1 MG injection INJECT PEN INTO THE MUSCLE ONCE AS NEEDED (Patient not taking: Reported on 11/10/2015) 1 kit 2  . glucose blood (ACCU-CHEK AVIVA PLUS) test strip USE 1 STRIP TO TEST 3 TIMES A DAY AS DIRECTED 100 each 5  . Glucose Blood DISK 1 each by In Vitro route daily. 100 each 3  . glucose blood test strip Use as instructed 100 each 12  . ibuprofen (ADVIL,MOTRIN) 800 MG tablet Take 1 tablet (800 mg total) by mouth every 8 (eight) hours as needed for moderate pain. 15 tablet 0  . insulin aspart (NOVOLOG) 100 UNIT/ML  injection Inject 3-7 Units into the skin 2 (two) times daily with a meal. 10 mL 11  . Insulin Glargine (LANTUS) 100 UNIT/ML Solostar Pen Inject 18 Units into the skin daily at 10 pm. 15 mL 11  . Insulin Pen Needle (BD PEN NEEDLE NANO U/F) 32G X 4 MM MISC USE AS DIRECTED TWICE A DAY WITH BYETTA 100 each 2  . Insulin Syringe-Needle U-100 (INSULIN SYRINGE .5CC/30GX5/16") 30G X 5/16" 0.5 ML MISC 4-5 Units by Does not apply route 3 (three) times daily with meals. 100 each 3  . lamoTRIgine (LAMICTAL) 25 MG tablet Take 4 tablets (100 mg total) by mouth daily. (Patient taking differently: Take 100 mg by mouth daily. Dr. Tammi Klippel wrote for one year supply starting May, 2017.  25 mg.  4 daily #120.) 120 tablet 2  . Lancets (ACCU-CHEK MULTICLIX) lancets Check sugar 3 x daily 204 each 5  . Liraglutide (VICTOZA) 18 MG/3ML SOPN INJECT 0.3 MLS (1.8 MG TOTAL) INTO THE SKIN DAILY. 9 pen 3  . lisdexamfetamine (VYVANSE) 70 MG capsule Take 1 capsule (70 mg total) by mouth every morning. (Patient taking differently: Take 70 mg by mouth every morning. #30 with 1 refill. Scripts dated:  11/04/15 and 12/05/15.  Per Dr. Tammi Klippel.) 30 capsule 0  . lisdexamfetamine (VYVANSE) 70 MG capsule Take 70 mg by mouth daily. Per Dr. Tammi Klippel in [facility].  #30  with five refills.  Due for another script in November, 2017.    . lisdexamfetamine (VYVANSE) 70 MG capsule Take 70 mg by mouth daily. Per. Dr. Tammi Klippel in [facility].  #30 with five refills.  Due for new script in April.    Marland Kitchen lisinopril (PRINIVIL,ZESTRIL) 20 MG tablet TAKE 1 TABLET (20 MG TOTAL) BY MOUTH DAILY. 30 tablet 6  . lisinopril (PRINIVIL,ZESTRIL) 20 MG tablet TAKE 1 TABLET (20 MG TOTAL) BY MOUTH DAILY. 30 tablet 6  . lithium carbonate (ESKALITH) 450 MG CR tablet Take 1 tablet (450 mg total) by mouth daily. Take one pill daily with food. 30 tablet 11  . methocarbamol (ROBAXIN) 500 MG tablet TAKE 1 TABLET (500 MG TOTAL) BY MOUTH EVERY 8 (EIGHT) HOURS AS NEEDED FOR MUSCLE SPASMS. 30 tablet 0  .  methocarbamol (ROBAXIN) 500 MG tablet TAKE 1 TABLET (500 MG TOTAL) BY MOUTH EVERY 8 (EIGHT) HOURS AS NEEDED FOR MUSCLE SPASMS. 30 tablet 0  . Multiple Vitamin (MULTIVITAMIN) capsule Take 1 capsule by mouth daily.      . nortriptyline (PAMELOR) 25 MG capsule TAKE 1 CAPSULE EVERY MORNING & 1 CAPSULE AT LUNCH AND 1 CAPSULE EVERY EVENING AT BEDTIME 180 capsule 0  . nortriptyline (PAMELOR) 25 MG capsule TAKE 1 CAPSULE EVERY MORNING & 1 CAPSULE AT LUNCH AND 1 CAPSULE EVERY EVENING AT BEDTIME 180 capsule 0  . omeprazole (PRILOSEC) 20 MG capsule TAKE ONE CAPSULE BY MOUTH ONCE A DAY 90 capsule 2  . pregabalin (LYRICA) 100 MG capsule Take 1 capsule (100 mg total) by mouth 3 (three) times daily. 90 capsule 2  . simvastatin (ZOCOR) 20 MG tablet Take 1 tablet (20 mg total) by mouth daily. 90 tablet 3  . tadalafil (CIALIS) 20 MG tablet Take 5 mg by mouth as needed.      . zolpidem (AMBIEN) 10 MG tablet Take 10 mg by mouth at bedtime as needed for sleep. Per Dr. Tammi Klippel in [facility].  #30 with 5 refills.  Due for refills early November, 2017    . [DISCONTINUED] HUMALOG 100 UNIT/ML injection INJECT 2-5 UNITS INTO THE SKIN 3 TIMES A DAY BEFORE MEALS 10 mL 3   No current facility-administered medications for this visit.   Chief complaint-noted No additions to family history Social history- patient is a non smoker  Objective: BP 151/74 mmHg  Pulse 72  Temp(Src) 98.3 F (36.8 C) (Oral)  Wt 197 lb (89.359 kg)  SpO2 98% Gen: NAD, alert, cooperative with exam HEENT: NCAT, EOMI, PERR Neck: FROM, supple, no thyromegaly palpable.  CV: RRR, good S1/S2, no murmur Resp: CTABL, no wheezes, non-labored Abd: SNTND, BS present, no guarding or organomegaly Ext: No edema, warm, normal tone, moves UE/LE spontaneously Neuro: Alert and oriented, No gross deficits Skin: no rashes no lesions  Assessment/Plan:   # DMII - long term use of insulin. Not on metformin. Was on this some time ago, but not sure why he came off.  -  Will restart metformin today.  - Increase your Lantus to 20 units.  - Keep the novolog where it is. Use only what you have to.  - check CBG's 4 times daily.  - F/U for A1C in 3 months.   # Long term use of Lithium - check TSH today.

## 2016-01-26 NOTE — Patient Instructions (Signed)
Check CBG's 3 times a day with meals, and once in the morning or evening.   Continue Novolog, and increase lantus to 20 units once daily.   Will add metformin. If this causes GI upset dose back to 500mg  once daily.   F/U in 3 months for repeat A1C  If you have any hypoglycemia. Go back to 18 units of Lantus daily.   Thanks for letting us take care of you.   Sincerely, Paula Compton, Md Family Medicine - PGY 2

## 2016-01-26 NOTE — Telephone Encounter (Addendum)
[facility] for Accu-Chek Aviva Plus test strips. PA denied, must use [employer] Contour Next. Please send in new Contour Next meter kit, lancets and test strips.  Derl Barrow, RN

## 2016-01-27 ENCOUNTER — Telehealth: Payer: Self-pay | Admitting: Student

## 2016-01-27 LAB — TSH: TSH: 2.19 mIU/L (ref 0.40–4.50)

## 2016-01-27 MED ORDER — GLUCOSE BLOOD VI STRP: ORAL_STRIP | Status: DC

## 2016-01-27 MED ORDER — BAYER CONTOUR NEXT MONITOR W/DEVICE KIT: 1.0000 | PACK | Freq: Two times a day (BID) | Status: DC

## 2016-01-27 NOTE — Telephone Encounter (Signed)
Pt called and needs a refill on his Metformin called in. jw °

## 2016-01-27 NOTE — Telephone Encounter (Signed)
He does not have metformin written for him. If he has questions he needs to schedule an appointment

## 2016-01-27 NOTE — Telephone Encounter (Signed)
Patient was seen yesterday and per office note from Dr. Minda Ditto patient was supposed to restart his metformin but there was no prescription sent into pharmacy. Kaicee Scarpino,CMA

## 2016-01-29 ENCOUNTER — Other Ambulatory Visit: Payer: Self-pay | Admitting: *Deleted

## 2016-01-29 DIAGNOSIS — E1140 Type 2 diabetes mellitus with diabetic neuropathy, unspecified: Secondary | ICD-10-CM

## 2016-01-29 MED ORDER — GLUCOSE BLOOD VI STRP: ORAL_STRIP | Status: DC

## 2016-02-01 ENCOUNTER — Other Ambulatory Visit: Payer: Self-pay | Admitting: *Deleted

## 2016-02-01 MED ORDER — GLUCAGON (RDNA) 1 MG IJ KIT: PACK | INTRAMUSCULAR | Status: DC

## 2016-02-16 ENCOUNTER — Other Ambulatory Visit: Payer: Self-pay | Admitting: *Deleted

## 2016-02-16 MED ORDER — METHOCARBAMOL 500 MG PO TABS: ORAL_TABLET | ORAL | Status: DC

## 2016-02-18 ENCOUNTER — Telehealth: Payer: Self-pay | Admitting: Student

## 2016-02-18 MED ORDER — METFORMIN HCL 500 MG PO TABS: 500.0000 mg | ORAL_TABLET | Freq: Every day | ORAL | Status: DC

## 2016-02-18 MED ORDER — GLUCOSE BLOOD VI STRP: ORAL_STRIP | Status: DC

## 2016-02-18 NOTE — Telephone Encounter (Signed)
Spoke with MD, she will call in now. Kamorie Aldous, Salome Spotted, CMA

## 2016-02-18 NOTE — Telephone Encounter (Signed)
Strips sent to pharmacy.  Will call and check with pharmacy by the end of the day to make sure that it was received. Jazmin Hartsell,CMA

## 2016-02-18 NOTE — Telephone Encounter (Signed)
Informed pt of rx renewal and should be ready for pick up by the end of the day. Page, cma.

## 2016-02-18 NOTE — Telephone Encounter (Signed)
Unable to see that patient has ever been on Metformin. Will restart asif he had never been on it. 500 qD for one week then increase to BID. He will need to follow up for next A1c or earlier as needed

## 2016-02-18 NOTE — Addendum Note (Signed)
Addended by: Valerie Roys on: 02/18/2016 02:05 PM   Modules accepted: Orders

## 2016-02-18 NOTE — Telephone Encounter (Signed)
Pt has never received his metformin or his 120 count test strips.dr Minda Ditto had increased his number of test strips because pt was taking his blood more often.  Dr Ramond Craver had prescibed both on his last visit.  Please advise.

## 2016-02-22 ENCOUNTER — Other Ambulatory Visit: Payer: Self-pay | Admitting: *Deleted

## 2016-02-22 DIAGNOSIS — E1140 Type 2 diabetes mellitus with diabetic neuropathy, unspecified: Secondary | ICD-10-CM

## 2016-02-22 MED ORDER — LISINOPRIL 20 MG PO TABS: ORAL_TABLET | ORAL | Status: DC

## 2016-02-22 MED ORDER — PREGABALIN 100 MG PO CAPS: 100.0000 mg | ORAL_CAPSULE | Freq: Three times a day (TID) | ORAL | Status: DC

## 2016-02-29 ENCOUNTER — Other Ambulatory Visit: Payer: Self-pay | Admitting: Family Medicine

## 2016-02-29 MED ORDER — INSULIN ASPART 100 UNIT/ML ~~LOC~~ SOLN: 3.0000 [IU] | Freq: Two times a day (BID) | SUBCUTANEOUS | Status: DC

## 2016-02-29 MED ORDER — METHOCARBAMOL 500 MG PO TABS: ORAL_TABLET | ORAL | Status: DC

## 2016-03-22 ENCOUNTER — Other Ambulatory Visit: Payer: Self-pay | Admitting: *Deleted

## 2016-03-23 ENCOUNTER — Ambulatory Visit (INDEPENDENT_AMBULATORY_CARE_PROVIDER_SITE_OTHER): Payer: BLUE CROSS/BLUE SHIELD | Admitting: Psychology

## 2016-03-23 ENCOUNTER — Other Ambulatory Visit: Payer: Self-pay | Admitting: *Deleted

## 2016-03-23 DIAGNOSIS — F3175 Bipolar disorder, in partial remission, most recent episode depressed: Secondary | ICD-10-CM | POA: Diagnosis not present

## 2016-03-23 LAB — BASIC METABOLIC PANEL
BUN: 21 mg/dL (ref 7–25)
CHLORIDE: 106 mmol/L (ref 98–110)
CO2: 22 mmol/L (ref 20–31)
CREATININE: 0.99 mg/dL (ref 0.70–1.33)
Calcium: 9.1 mg/dL (ref 8.6–10.3)
GLUCOSE: 160 mg/dL — AB (ref 65–99)
Potassium: 4.2 mmol/L (ref 3.5–5.3)
Sodium: 137 mmol/L (ref 135–146)

## 2016-03-23 MED ORDER — LIRAGLUTIDE 18 MG/3ML ~~LOC~~ SOPN: PEN_INJECTOR | SUBCUTANEOUS | 3 refills | Status: DC

## 2016-03-23 NOTE — Assessment & Plan Note (Addendum)
Decrease in mood and function.  Dr. Tammi Klippel reviewed medications and prior lab work.  Asked Patrick Elliott to decrease Nortriptyline to 50 mg daily at bedtime (previously doing three times a day dosing).  Will see Patrick Elliott back sooner than usual to reassess mood and function.  Will call with results of lab work.    See patient instructions for further plan.

## 2016-03-23 NOTE — Progress Notes (Signed)
Reason for follow-up:  Continued mood and attention management.  Issues discussed:  Reported decrease in mood and function for about a month (although specifics around symptoms and timing is historically hard for him to report).  Called his significant other and she has noticed the same.  She is having some significant stressors and is wondering if that is taking a toll.    Identified goals:  Dr. Tammi Klippel ordered lab work.  He is concerned Patrick Elliott's nortriptyline (75 mg) might be negatively effecting his mood.

## 2016-03-23 NOTE — Patient Instructions (Addendum)
Please schedule a follow-up for:  September 6th at 11:00. Sorry you are feeling more down and having more difficulty getting things done that are important to you. Dr. Tammi Klippel ordered some lab work today.  I will run the results by him and give you a call. Dr. Tammi Klippel recommended decreasing your Nortriptyline dose to 50 mg at bedtime.  He is hoping that will help your neuropathic pain a bit more.

## 2016-03-24 LAB — LITHIUM LEVEL: Lithium Lvl: 0.6 mmol/L (ref 0.6–1.2)

## 2016-03-24 LAB — NORTRIPTYLINE LEVEL: Nortriptyline Lvl: 121 mcg/L (ref 50–150)

## 2016-03-25 MED ORDER — OMEPRAZOLE 20 MG PO CPDR: DELAYED_RELEASE_CAPSULE | ORAL | 2 refills | Status: DC

## 2016-04-05 ENCOUNTER — Telehealth: Payer: Self-pay | Admitting: Psychology

## 2016-04-05 NOTE — Telephone Encounter (Signed)
Reviewed labwork with Dr. Tammi Klippel who stated with the exception of the glucose, all is normal and no changes needed.  Called [name] to report results.  Left VM.

## 2016-04-25 ENCOUNTER — Other Ambulatory Visit: Payer: Self-pay | Admitting: Family Medicine

## 2016-04-25 DIAGNOSIS — E1140 Type 2 diabetes mellitus with diabetic neuropathy, unspecified: Secondary | ICD-10-CM

## 2016-04-26 NOTE — Telephone Encounter (Signed)
Pt is calling for a refill on his Lyrica. He is out and the pharmacy said that he didn't have any refills on this. jw

## 2016-04-27 ENCOUNTER — Ambulatory Visit (INDEPENDENT_AMBULATORY_CARE_PROVIDER_SITE_OTHER): Payer: BLUE CROSS/BLUE SHIELD | Admitting: Psychology

## 2016-04-27 DIAGNOSIS — F3175 Bipolar disorder, in partial remission, most recent episode depressed: Secondary | ICD-10-CM

## 2016-04-27 MED ORDER — PREGABALIN 100 MG PO CAPS: 100.0000 mg | ORAL_CAPSULE | Freq: Three times a day (TID) | ORAL | 0 refills | Status: DC

## 2016-04-27 MED ORDER — ACCU-CHEK AVIVA PLUS W/DEVICE KIT: PACK | 0 refills | Status: DC

## 2016-04-27 NOTE — Progress Notes (Signed)
Reason for follow-up:  We asked Patrick Elliott to come back sooner than usual due to a reported decrease in mood and function.  Dr. Tammi Klippel decreased his nortriptyline last visit and we had him back to check-in.  Issues discussed:  Patrick Elliott reports his neuropathy is no better and no worse with the decreased dose.  He can not tell a difference in his mood or function.  Tends to get side tracked when trying to accomplish goals.  This is not new for him.  Humming remains an issue.    Identified goals:  Dr. Tammi Klippel thought it reasonable to go down further on the nortriptyline to 25 mg.  Patrick Elliott agreed.  Looking for no change in pain perceptions and perhaps a bit better clarify of thinking and emotional stability (although this has always been a moving target for [facility]).

## 2016-04-27 NOTE — Telephone Encounter (Signed)
lyrica RX up front.

## 2016-04-27 NOTE — Patient Instructions (Signed)
Please schedule a follow-up for:  October 4th at 11:00.  Dr. Tammi Klippel recommended that you continue to decrease the nortriptyline.  Take 25 mg a day.  Please monitor if your pain gets worse and / or your thinking or emotional stability get better.    I mentioned the myth of Sisyphus.  You may want to check it out and see if it is interesting at all to you (Wikipedia??)

## 2016-04-27 NOTE — Assessment & Plan Note (Signed)
Patient reports mood variability with no specific details.  Function remains low per his report.  Patrick Elliott not here today to weigh in.  Revisited the concept of acceptance as it has been difficult to identify targets and get any level of sustained improvement.  Dr. Tammi Klippel thought it reasonable to decrease nortriptyline further to try to minimize any negative effect on mood while not compromising management of neuropathy.  He is hoping the Lyrica can manage that.

## 2016-04-28 ENCOUNTER — Other Ambulatory Visit: Payer: Self-pay | Admitting: Family Medicine

## 2016-04-28 DIAGNOSIS — E1140 Type 2 diabetes mellitus with diabetic neuropathy, unspecified: Secondary | ICD-10-CM

## 2016-05-03 ENCOUNTER — Encounter: Payer: Self-pay | Admitting: Student

## 2016-05-03 MED ORDER — PREGABALIN 100 MG PO CAPS: 100.0000 mg | ORAL_CAPSULE | Freq: Three times a day (TID) | ORAL | 0 refills | Status: DC

## 2016-05-17 ENCOUNTER — Other Ambulatory Visit: Payer: Self-pay | Admitting: *Deleted

## 2016-05-18 ENCOUNTER — Telehealth: Payer: Self-pay | Admitting: Psychology

## 2016-05-18 MED ORDER — "INSULIN SYRINGE 30G X 5/16"" 0.5 ML MISC": 4.0000 [IU] | Freq: Three times a day (TID) | 3 refills | Status: DC

## 2016-05-18 NOTE — Telephone Encounter (Signed)
Called patient and left a VM.  Dr. Tammi Klippel is not available on 10/4 which is Patrick Elliott's [facility] follow-up.  Asked Patrick Elliott to call me to reschedule.

## 2016-05-24 ENCOUNTER — Other Ambulatory Visit: Payer: Self-pay | Admitting: *Deleted

## 2016-05-24 MED ORDER — METHOCARBAMOL 500 MG PO TABS: ORAL_TABLET | ORAL | 5 refills | Status: DC

## 2016-05-24 NOTE — Telephone Encounter (Signed)
Patrick Elliott called back to discuss rescheduling.  Offered to see him tomorrow at 10:30 or two weeks from now (the 18th) at 11:30.  Asked him to call to confirm.

## 2016-05-25 ENCOUNTER — Other Ambulatory Visit: Payer: Self-pay | Admitting: Family Medicine

## 2016-05-25 NOTE — Telephone Encounter (Signed)
No longer a [facility] patient  

## 2016-05-27 ENCOUNTER — Other Ambulatory Visit: Payer: Self-pay | Admitting: Family Medicine

## 2016-05-27 DIAGNOSIS — E1140 Type 2 diabetes mellitus with diabetic neuropathy, unspecified: Secondary | ICD-10-CM

## 2016-05-30 ENCOUNTER — Other Ambulatory Visit: Payer: Self-pay | Admitting: Student

## 2016-05-30 DIAGNOSIS — E1140 Type 2 diabetes mellitus with diabetic neuropathy, unspecified: Secondary | ICD-10-CM

## 2016-05-30 NOTE — Telephone Encounter (Signed)
Needs refill on [facility]rica.  [facility] [place]  Has limited transportation

## 2016-05-31 MED ORDER — PREGABALIN 100 MG PO CAPS: 100.0000 mg | ORAL_CAPSULE | Freq: Three times a day (TID) | ORAL | 5 refills | Status: DC

## 2016-05-31 NOTE — Telephone Encounter (Signed)
Medication called into pharmacy by provider. Monterius Rolf,CMA

## 2016-05-31 NOTE — Telephone Encounter (Signed)
Lyrica has been called in to his pharmacy.

## 2016-06-05 ENCOUNTER — Other Ambulatory Visit: Payer: Self-pay | Admitting: Family Medicine

## 2016-06-06 NOTE — Telephone Encounter (Signed)
No longer a [facility] patient  

## 2016-06-07 ENCOUNTER — Other Ambulatory Visit: Payer: Self-pay | Admitting: Family Medicine

## 2016-06-07 NOTE — Telephone Encounter (Signed)
No longer a [facility] patient  

## 2016-06-08 ENCOUNTER — Ambulatory Visit (INDEPENDENT_AMBULATORY_CARE_PROVIDER_SITE_OTHER): Payer: BLUE CROSS/BLUE SHIELD | Admitting: Psychology

## 2016-06-08 DIAGNOSIS — F3175 Bipolar disorder, in partial remission, most recent episode depressed: Secondary | ICD-10-CM

## 2016-06-08 DIAGNOSIS — F900 Attention-deficit hyperactivity disorder, predominantly inattentive type: Secondary | ICD-10-CM | POA: Diagnosis not present

## 2016-06-08 NOTE — Patient Instructions (Signed)
Please schedule a follow-up for:  January 24th at 11:30.  You can call before if you need to be seen sooner.  You look really good to me Patrick Elliott.  Your thoughts and how you express yourself seems better than most times that I have seen you.  I expect you to have shifts in mood, energy, and thinking but am hopeful that you can manage them.

## 2016-06-08 NOTE — Assessment & Plan Note (Signed)
Dr. Tammi Klippel refilled Vyvanse (three scripts).  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

## 2016-06-08 NOTE — Progress Notes (Signed)
Reason for follow-up:  Follow-up for continued mood management.  Issues discussed:  Drop in nortriptyline has produced some mild "pinsticks" but no increase in burning.  Dr. Tammi Klippel thinks that this is a better dose for his mood.  Report of mood and function is similar to past reports but with perhaps more details than typical.  [name] is present today and adds some information.    Identified goals:  Continued medication adherence coupled with behavioral steps.

## 2016-06-08 NOTE — Assessment & Plan Note (Signed)
Report of mood is euthymic.  Affect is bright.  Thoughts are clear and goal directed.  Reports function that seems good for him based on prior reports.

## 2016-06-14 ENCOUNTER — Other Ambulatory Visit: Payer: Self-pay | Admitting: *Deleted

## 2016-06-14 MED ORDER — GLUCAGON (RDNA) 1 MG IJ KIT: PACK | INTRAMUSCULAR | 2 refills | Status: DC

## 2016-07-01 ENCOUNTER — Other Ambulatory Visit: Payer: Self-pay | Admitting: Family Medicine

## 2016-07-05 ENCOUNTER — Other Ambulatory Visit: Payer: Self-pay | Admitting: Student

## 2016-07-05 MED ORDER — "INSULIN SYRINGE 30G X 5/16"" 0.5 ML MISC": 4.0000 [IU] | Freq: Three times a day (TID) | 3 refills | Status: DC

## 2016-07-05 NOTE — Telephone Encounter (Signed)
Pt needs a refill pen needles for Lantus and Victoza. Pharm on file is correct. Please advise. Thanks! ep

## 2016-07-07 ENCOUNTER — Other Ambulatory Visit: Payer: Self-pay | Admitting: *Deleted

## 2016-07-07 MED ORDER — LIRAGLUTIDE 18 MG/3ML ~~LOC~~ SOPN: PEN_INJECTOR | SUBCUTANEOUS | 3 refills | Status: DC

## 2016-07-07 MED ORDER — SIMVASTATIN 20 MG PO TABS: 20.0000 mg | ORAL_TABLET | Freq: Every day | ORAL | 3 refills | Status: DC

## 2016-07-17 ENCOUNTER — Encounter: Payer: Self-pay | Admitting: Student

## 2016-07-18 ENCOUNTER — Telehealth: Payer: Self-pay | Admitting: Student

## 2016-07-18 MED ORDER — INSULIN PEN NEEDLE 32G X 4 MM MISC: 2 refills | Status: DC

## 2016-07-18 NOTE — Telephone Encounter (Signed)
Needles refilled. He was called regarding this and as he did not answer a voicemail was left that asked to make an appointment for Diabetes check as he is at least due for an A1c

## 2016-07-25 ENCOUNTER — Other Ambulatory Visit: Payer: Self-pay | Admitting: *Deleted

## 2016-07-25 MED ORDER — ACCU-CHEK AVIVA PLUS W/DEVICE KIT: PACK | 0 refills | Status: DC

## 2016-07-29 ENCOUNTER — Ambulatory Visit: Payer: BLUE CROSS/BLUE SHIELD | Admitting: Student

## 2016-07-29 ENCOUNTER — Telehealth: Payer: Self-pay

## 2016-07-29 NOTE — Telephone Encounter (Signed)
Called pt and LVM stating our clinic is closing at 3 due to inclement weather, please call back to rs, and we are sorry for the inconvenience.

## 2016-08-12 ENCOUNTER — Other Ambulatory Visit: Payer: Self-pay | Admitting: *Deleted

## 2016-08-12 MED ORDER — CARVEDILOL 6.25 MG PO TABS: ORAL_TABLET | ORAL | 3 refills | Status: DC

## 2016-08-18 ENCOUNTER — Other Ambulatory Visit: Payer: Self-pay | Admitting: Student

## 2016-08-18 ENCOUNTER — Encounter: Payer: Self-pay | Admitting: Psychology

## 2016-08-18 MED ORDER — NORTRIPTYLINE HCL 25 MG PO CAPS: ORAL_CAPSULE | ORAL | 3 refills | Status: DC

## 2016-08-18 MED ORDER — LAMOTRIGINE 25 MG PO TABS: 100.0000 mg | ORAL_TABLET | Freq: Every day | ORAL | 3 refills | Status: DC

## 2016-08-18 NOTE — Telephone Encounter (Signed)
Pt needs refills on lamotrigine and nortriptyline. Pharm on file is correct. ep

## 2016-08-18 NOTE — Telephone Encounter (Signed)
meds refilled 

## 2016-08-19 ENCOUNTER — Ambulatory Visit (INDEPENDENT_AMBULATORY_CARE_PROVIDER_SITE_OTHER): Payer: BLUE CROSS/BLUE SHIELD | Admitting: Student

## 2016-08-19 ENCOUNTER — Encounter: Payer: Self-pay | Admitting: Student

## 2016-08-19 VITALS — BP 114/78 | HR 84 | Temp 98.3°F | Ht 67.0 in | Wt 194.8 lb

## 2016-08-19 DIAGNOSIS — Z23 Encounter for immunization: Secondary | ICD-10-CM

## 2016-08-19 DIAGNOSIS — I10 Essential (primary) hypertension: Secondary | ICD-10-CM

## 2016-08-19 DIAGNOSIS — E7849 Other hyperlipidemia: Secondary | ICD-10-CM

## 2016-08-19 DIAGNOSIS — E1140 Type 2 diabetes mellitus with diabetic neuropathy, unspecified: Secondary | ICD-10-CM | POA: Diagnosis not present

## 2016-08-19 DIAGNOSIS — Z114 Encounter for screening for human immunodeficiency virus [HIV]: Secondary | ICD-10-CM

## 2016-08-19 DIAGNOSIS — E784 Other hyperlipidemia: Secondary | ICD-10-CM

## 2016-08-19 DIAGNOSIS — Z1159 Encounter for screening for other viral diseases: Secondary | ICD-10-CM

## 2016-08-19 DIAGNOSIS — Z0000 Encounter for general adult medical examination without abnormal findings: Secondary | ICD-10-CM | POA: Insufficient documentation

## 2016-08-19 LAB — POCT GLYCOSYLATED HEMOGLOBIN (HGB A1C): Hemoglobin A1C: 7.1

## 2016-08-19 NOTE — Progress Notes (Signed)
   Subjective:    Patient ID: Patrick Elliott, male    DOB: 09-27-1960, 55 y.o.   MRN: [id number]   CC:  HPI:   Smoking status reviewed  Review of Systems  Per HPI, else denies recent illness, fever, headache, changes in vision, chest pain, shortness of breath, abdominal pain, N/V/D, weakness    Objective:  BP 114/78 (BP Location: Right Arm, Patient Position: Sitting, Cuff Size: Normal)   Pulse 84   Temp 98.3 F (36.8 C) (Oral)   Ht 5\' 7"  (1.702 m)   Wt 194 lb 12.8 oz (88.4 kg)   SpO2 97%   BMI 30.51 kg/m  Vitals and nursing note reviewed  General: NAD Cardiac: RRR, normal heart sounds, no murmurs. 2+ radial and PT pulses bilaterally Respiratory: CTAB, normal effort Abdomen: soft, nontender, nondistended, no hepatic or splenomegaly. Bowel sounds present Extremities: no edema or cyanosis. WWP. Skin: warm and dry, no rashes noted Neuro: alert and oriented, no focal deficits   Assessment & Plan:    No problem-specific Assessment & Plan notes found for this encounter.    Atharv Barriere A. Lincoln Brigham MD, [facility] Resident PGY-3 Pager [phone]

## 2016-08-19 NOTE — Assessment & Plan Note (Addendum)
Pneumonia vaccine, Tdap today - HCV, HIV at next visit

## 2016-08-19 NOTE — Assessment & Plan Note (Addendum)
A1c 7.1 - continue current regimen - discussed diet and exercise - pt to schedule eye exam - Pneumo vaccine today - Lipid panel at next visit

## 2016-08-19 NOTE — Assessment & Plan Note (Signed)
Reports compliance with Zocor, lipid panel at next visit

## 2016-08-19 NOTE — Progress Notes (Signed)
   Subjective:    Patient ID: Patrick Elliott, male    DOB: [birth year], 55 y.o.   MRN: [id number]   CC: DM follow up  HPI: 55 y/o M with DM presents for follow up  DM - A1c 7.1 - reports compliance with DM regimen - wears hard soled shoes at all time - has not had eye exam this year - denies foot trauma  Smoking status reviewed  Review of Systems  Per HPI, else denies recent illness, fever, chest pain, shortness of breath, abdominal pain, N/V/D,   Objective:  BP 114/78 (BP Location: Right Arm, Patient Position: Sitting, Cuff Size: Normal)   Pulse 84   Temp 98.3 F (36.8 C) (Oral)   Ht 5\' 7"  (1.702 m)   Wt 194 lb 12.8 oz (88.4 kg)   SpO2 97%   BMI 30.51 kg/m  Vitals and nursing note reviewed  General: NAD Cardiac: RRR Respiratory: CTAB, normal effort Extremities: no edema or cyanosis. See quality metrics for foot exam Skin: warm and dry, no rashes noted Neuro: alert and oriented   Assessment & Plan:    HYPERTENSION, BENIGN ESSENTIAL Blood pressure at goal on Coreg -continue current regimen  Diabetes mellitus with neuropathy A1c 7.1 - continue current regimen - discussed diet and exercise - pt to schedule eye exam - Pneumo vaccine today - Lipid panel at next visit  Healthcare maintenance Pneumonia vaccine today - HCV, HIV at next visit  HYPERLIPIDEMIA Reports compliance with Zocor, lipid panel at next visit    [place]. Lincoln Brigham MD, [facility] Resident PGY-3 Pager [phone]

## 2016-08-19 NOTE — Assessment & Plan Note (Signed)
Blood pressure at goal on Coreg -continue current regimen

## 2016-08-19 NOTE — Patient Instructions (Addendum)
Follow up in 3 months for DM check Please obtain colonoscopy If you have questions or concerns,c all the office at [phone]

## 2016-08-24 ENCOUNTER — Encounter: Payer: Self-pay | Admitting: Student

## 2016-08-26 ENCOUNTER — Encounter: Payer: Self-pay | Admitting: Psychology

## 2016-09-01 ENCOUNTER — Telehealth: Payer: Self-pay | Admitting: *Deleted

## 2016-09-01 ENCOUNTER — Encounter: Payer: Self-pay | Admitting: Psychology

## 2016-09-01 NOTE — Telephone Encounter (Signed)
[facility]. Formulary and PA form placed in provider box for completion. Apryl Brymer L, RN  

## 2016-09-02 ENCOUNTER — Encounter: Payer: Self-pay | Admitting: *Deleted

## 2016-09-02 ENCOUNTER — Telehealth: Payer: Self-pay | Admitting: Student

## 2016-09-02 NOTE — Telephone Encounter (Signed)
Patient called regarding lyrica not being approved. Would like to start gabapentin. A message was left discussing this option and he was asked to call the clinic to discuss. Please continue to ask patient about switching to gabapentin from lyrica

## 2016-09-02 NOTE — Telephone Encounter (Signed)
mychart message sent to patient. Patrick Elliott,CMA  

## 2016-09-09 ENCOUNTER — Telehealth: Payer: Self-pay | Admitting: Student

## 2016-09-09 MED ORDER — GABAPENTIN 100 MG PO CAPS: 100.0000 mg | ORAL_CAPSULE | Freq: Three times a day (TID) | ORAL | 3 refills | Status: DC

## 2016-09-09 NOTE — Telephone Encounter (Signed)
Patient has an appointment on 09-23-16. Tamea Bai,CMA

## 2016-09-09 NOTE — Telephone Encounter (Signed)
The patient was again called regarding starting gabapentin instead of lyrica as this would be covered by his insurance. He did not answer so a message was again left. I have prescribed gabapentin for him to try. If he has objection to this, he was directed in the message to call the office. He was also asked to make an appointment to discuss some of his other coverage issues that I was not aware of. Please reach out to the patient to make an appointment

## 2016-09-12 NOTE — Telephone Encounter (Signed)
The patient was offered gabapentin and this was filled for him as he has not tried it yet and it is on formulary

## 2016-09-12 NOTE — Telephone Encounter (Signed)
Following up regarding this PA. Was this PA completed? Derl Barrow, RN

## 2016-09-13 ENCOUNTER — Other Ambulatory Visit: Payer: Self-pay | Admitting: *Deleted

## 2016-09-13 DIAGNOSIS — E1140 Type 2 diabetes mellitus with diabetic neuropathy, unspecified: Secondary | ICD-10-CM

## 2016-09-14 ENCOUNTER — Ambulatory Visit (INDEPENDENT_AMBULATORY_CARE_PROVIDER_SITE_OTHER): Payer: Medicaid Other | Admitting: Psychology

## 2016-09-14 DIAGNOSIS — F3175 Bipolar disorder, in partial remission, most recent episode depressed: Secondary | ICD-10-CM

## 2016-09-14 MED ORDER — INSULIN GLARGINE 100 UNIT/ML SOLOSTAR PEN: 18.0000 [IU] | PEN_INJECTOR | Freq: Every day | SUBCUTANEOUS | 11 refills | Status: DC

## 2016-09-14 MED ORDER — LISINOPRIL 20 MG PO TABS: ORAL_TABLET | ORAL | 6 refills | Status: DC

## 2016-09-14 NOTE — Assessment & Plan Note (Signed)
PHQ-9 is elevated at 21.  #9 is scored 1.  Patrick Elliott denies active suicidal ideation.  Fleeting thoughts.  No plan.  Description of depressive symptoms is consistent with past.  Function sounds potentially above normal for him given his care taking of Patrick Elliott.  He does not believe he is doing much but treatment team suspects, based on what we know, that Patrick Elliott would disagree.  He appears to treatment team to be doing well - eye contact is good, his speech is well organized, he is engaged and smiles.    Refilled prescriptions.  Will likely need prior authorizations bc of switch in insurance.  Provided support and encouragement around role he has taken with Patrick Elliott.  Will follow in 2-3 months or sooner if needed.

## 2016-09-14 NOTE — Patient Instructions (Signed)
Please schedule a follow-up for 3/21 at 11:30.    Please tell Patrick Elliott we said hello!

## 2016-09-14 NOTE — Progress Notes (Signed)
Reason for follow-up:  Patrick Elliott presents for continued care of mood issues.    Issues discussed:  He has changed insurances and has questions about his medications.  Additionally, he reports [name] has had two knee surgeries since we last met and recovery has been stressful.

## 2016-09-20 ENCOUNTER — Telehealth: Payer: Self-pay | Admitting: *Deleted

## 2016-09-20 NOTE — Telephone Encounter (Signed)
[facility]oza 1.8 mg. PA approved via [place] [employer] until 09/20/17. Approval number: [id number].  Derl Barrow, RN

## 2016-09-21 ENCOUNTER — Telehealth: Payer: Self-pay | Admitting: *Deleted

## 2016-09-21 NOTE — Telephone Encounter (Signed)
[facility] 100 [facility]. PA approved via [place] [employer] until 09/15/17.  Approval number: [id number].  Derl Barrow, RN

## 2016-09-23 ENCOUNTER — Other Ambulatory Visit: Payer: Self-pay | Admitting: *Deleted

## 2016-09-23 ENCOUNTER — Encounter: Payer: Self-pay | Admitting: Student

## 2016-09-23 ENCOUNTER — Ambulatory Visit (INDEPENDENT_AMBULATORY_CARE_PROVIDER_SITE_OTHER): Payer: Medicaid Other | Admitting: Student

## 2016-09-23 DIAGNOSIS — E1140 Type 2 diabetes mellitus with diabetic neuropathy, unspecified: Secondary | ICD-10-CM

## 2016-09-23 MED ORDER — METHOCARBAMOL 500 MG PO TABS: ORAL_TABLET | ORAL | 5 refills | Status: DC

## 2016-09-23 MED ORDER — PREGABALIN 100 MG PO CAPS: 100.0000 mg | ORAL_CAPSULE | Freq: Three times a day (TID) | ORAL | 5 refills | Status: DC

## 2016-09-23 NOTE — Assessment & Plan Note (Signed)
Victoza improved so will continue this. There are cup refilled as this is also not been approved. Follow-up in one month for A1c and diabetic labs

## 2016-09-23 NOTE — Progress Notes (Signed)
   Subjective:    Patient ID: Patrick Elliott, male    DOB: Dec 30, 1960, 56 y.o.   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 presents for question of his medications  Diabetes medications - Victoza and Lyrica previously were not approved, but recently approved 2 days ago - Patient has no further current concerns about his diabetes medications  -last A1c 2 months ago was 7.1  Jury duty - Additionally was called for jury duty and requests a letter to excuse him from this  Smoking status reviewed  Review of Systems  Per HPI, else denies recent illness, fever, headache, changes in vision, chest pain, shortness of breath, abdominal pain    Objective:  BP 128/68   Pulse 85   Temp 98.1 F (36.7 C) (Oral)   Wt 190 lb (86.2 kg)   SpO2 99%   BMI 29.76 kg/m  Vitals and nursing note reviewed  General: NAD Cardiac: RRR Respiratory: CTAB, normal effort Skin: warm and dry, Neuro: alert and oriented   Assessment & Plan:    Diabetes mellitus with neuropathy Victoza improved so will continue this. There are cup refilled as this is also not been approved. Follow-up in one month for A1c and diabetic labs   [place] duty  -Given significant health issues will write letters using him from jury duty   [place]. Lincoln Brigham MD, [facility] Resident PGY-3 Pager [phone]

## 2016-09-23 NOTE — Patient Instructions (Signed)
Follow up in one month for diabetes check Call the office for any questions of concerns

## 2016-09-26 ENCOUNTER — Telehealth: Payer: Self-pay | Admitting: Student

## 2016-09-26 NOTE — Telephone Encounter (Signed)
Will forward to MD. Jazmin Hartsell,CMA  

## 2016-09-26 NOTE — Telephone Encounter (Signed)
Pt is calling because he was seen last week by Dr. Lincoln Brigham and asked if she could write a letter so that he doesn't have to do jury duty. He would like this letter before the end of the week. Please let patient know when ready to pick up. jw

## 2016-09-26 NOTE — Telephone Encounter (Signed)
Patient is aware. Jazmin Hartsell,CMA  

## 2016-09-26 NOTE — Telephone Encounter (Signed)
Letter was written and is in chart. Please print and leave at front for Patrick Elliott. Please call him when it is ready to pick up

## 2016-10-03 ENCOUNTER — Encounter: Payer: Self-pay | Admitting: Student

## 2016-10-26 ENCOUNTER — Telehealth: Payer: Self-pay | Admitting: Psychology

## 2016-10-26 ENCOUNTER — Ambulatory Visit: Payer: Medicaid Other | Admitting: Student

## 2016-10-26 NOTE — Telephone Encounter (Signed)
He has an appt with you on 3/21 at 11:30 and Patrick Elliott says you schedule your appointments but it is not on schedule, so just FYI.  Thanks.

## 2016-10-27 NOTE — Telephone Encounter (Signed)
Thank you!  I will put it in.  He is right!  And I am late in putting things in.

## 2016-11-02 ENCOUNTER — Encounter: Payer: Self-pay | Admitting: Student

## 2016-11-02 ENCOUNTER — Ambulatory Visit (INDEPENDENT_AMBULATORY_CARE_PROVIDER_SITE_OTHER): Payer: Medicaid Other | Admitting: Student

## 2016-11-02 VITALS — BP 118/70 | HR 79 | Temp 98.2°F | Wt 192.0 lb

## 2016-11-02 DIAGNOSIS — Z114 Encounter for screening for human immunodeficiency virus [HIV]: Secondary | ICD-10-CM | POA: Diagnosis not present

## 2016-11-02 DIAGNOSIS — E1140 Type 2 diabetes mellitus with diabetic neuropathy, unspecified: Secondary | ICD-10-CM

## 2016-11-02 DIAGNOSIS — Z1159 Encounter for screening for other viral diseases: Secondary | ICD-10-CM | POA: Diagnosis not present

## 2016-11-02 DIAGNOSIS — Z0000 Encounter for general adult medical examination without abnormal findings: Secondary | ICD-10-CM

## 2016-11-02 DIAGNOSIS — Z13220 Encounter for screening for lipoid disorders: Secondary | ICD-10-CM | POA: Diagnosis present

## 2016-11-02 LAB — LIPID PANEL
CHOL/HDL RATIO: 3.9 ratio (ref <5.0)
CHOLESTEROL: 143 mg/dL (ref <200)
HDL: 37 mg/dL — ABNORMAL LOW (ref >40)
LDL Cholesterol: 83 mg/dL (ref <100)
TRIGLYCERIDES: 114 mg/dL (ref <150)
VLDL: 23 mg/dL (ref <30)

## 2016-11-02 NOTE — Assessment & Plan Note (Signed)
HIV, Hep c, lipid panel today. Information for colonoscopy given

## 2016-11-02 NOTE — Assessment & Plan Note (Signed)
Doing well with lyrica - up to date on eye exam, foot exam  - A1c in 2 weeks

## 2016-11-02 NOTE — Patient Instructions (Signed)
Follow up in 2 weeks for A1c Please schedule colonoscopy You had labs today, you will be called regarding any abnormal results If you have questions or concerns, call the office at [phone]

## 2016-11-02 NOTE — Progress Notes (Signed)
   Subjective:    Patient ID: Patrick Elliott, male    DOB: 1961-01-26, 56 y.o.   MRN: [id number]   CC: lyrica follow up  HPI: 56 y/o M with T2DM with neuropathy presents for lyrica follow up  T2DM -  Has taken it with no issue and feels it has helped - had eye exam 6 months ago - reports compliance with DM regimen  Health care maintenance - due for hep c, hiv screening as well as colonoscopy  Smoking status reviewed  Review of Systems  Per HPI, else denies recent illness, fever, chest pain, shortness of breath,     Objective:  BP 118/70   Pulse 79   Temp 98.2 F (36.8 C) (Oral)   Wt 192 lb (87.1 kg)   SpO2 99%   BMI 30.07 kg/m  Vitals and nursing note reviewed  General: NAD Cardiac: RRR Respiratory: CTAB, normal effort Skin: warm and dry, no rashes noted Neuro: alert and oriented, no focal deficits   Assessment & Plan:    Diabetes mellitus with neuropathy Doing well with lyrica - up to date on eye exam, foot exam  - A1c in 2 weeks  Healthcare maintenance HIV, Hep c, lipid panel today. Information for colonoscopy given    Austin Pongratz A. Lincoln Brigham MD, [facility] Resident PGY-3 Pager [phone]

## 2016-11-03 LAB — HIV ANTIBODY (ROUTINE TESTING W REFLEX): HIV: NONREACTIVE

## 2016-11-03 LAB — HEPATITIS C ANTIBODY: HCV Ab: NEGATIVE

## 2016-11-09 ENCOUNTER — Ambulatory Visit (INDEPENDENT_AMBULATORY_CARE_PROVIDER_SITE_OTHER): Payer: Medicaid Other | Admitting: Psychology

## 2016-11-09 DIAGNOSIS — F3175 Bipolar disorder, in partial remission, most recent episode depressed: Secondary | ICD-10-CM

## 2016-11-09 NOTE — Assessment & Plan Note (Signed)
We continue to have a difficulty time assessing whether the discontent and dissatisfaction with function is temperamental in nature (he is always going to report these things) vs. A change.  He reports this is a change.  His significant other sees his function as being very similar to other times.  They have been under a fair amount of stress recently with Patrick Elliott bilateral knee replacements.  When asked if Patrick Elliott was hoping for any changes - he reported he was not.    Treatment team remains in a position where options for change are limited (although Dr. Tammi Klippel reports a new medicine for ADHD will hit the market soon and this might be a good match for Patrick Elliott).  Meditation might be helpful Patrick Elliott says he has three apps for this) and acceptance that life is full of some drudgery may also be good.  We will follow in three months or sooner if needed.    Mood is reported as depressed.  Motivation low.  Thought process is typical for him - he is a moving target.  Affect is within normal limits.  No evidence of SI.

## 2016-11-09 NOTE — Progress Notes (Signed)
Reason for follow-up:  Last seen two months ago.  Refills on medicine provided at that time.  He takes Vyvanse, Lamictal, Lithium and Ambien.  We may write Pamelor as well but that is for neuropathic pain.  Issues discussed:  Continues to struggle with mood swings and productivity.  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  He was caring for [name].  Continues to go on outings with mom and uncle about once a week and is a "friend" to the people in his court.  With increased motivation, he says his house would be neater but upon further questioning - this is not important to him.  He makes the statement that he wishes he could forego the "requirements" of life and focus on painting.

## 2016-11-09 NOTE — Patient Instructions (Addendum)
It was good to see you today Patrick Elliott.  Please schedule a follow-up for:  June 20th at 11:30.  Please call in between if anything changes appreciably.

## 2016-11-14 ENCOUNTER — Other Ambulatory Visit: Payer: Self-pay | Admitting: *Deleted

## 2016-11-14 MED ORDER — LIRAGLUTIDE 18 MG/3ML ~~LOC~~ SOPN: PEN_INJECTOR | SUBCUTANEOUS | 3 refills | Status: DC

## 2016-11-16 ENCOUNTER — Encounter: Payer: Self-pay | Admitting: Student

## 2016-11-16 ENCOUNTER — Ambulatory Visit (INDEPENDENT_AMBULATORY_CARE_PROVIDER_SITE_OTHER): Payer: Medicaid Other | Admitting: Student

## 2016-11-16 VITALS — BP 100/58 | HR 70 | Temp 98.0°F | Wt 193.0 lb

## 2016-11-16 DIAGNOSIS — E1140 Type 2 diabetes mellitus with diabetic neuropathy, unspecified: Secondary | ICD-10-CM | POA: Diagnosis present

## 2016-11-16 LAB — POCT GLYCOSYLATED HEMOGLOBIN (HGB A1C): Hemoglobin A1C: 8.1

## 2016-11-16 NOTE — Progress Notes (Signed)
   Subjective:    Patient ID: Patrick Elliott, male    DOB: 1961/02/22, 56 y.o.   MRN: [id number]   CC: A1c check  HPI: 56 y/o M with T2DM presents for A1c check  A1c - last A1c was 7.1, he reports compliance with DM regimen - he does note that he has had highs over the last month with 1-2 CBGs in the 400s - he denies fevers or recent infections - his fasting the AM was 450, last night he ate hush puppies, fried shrimp and french fries for dinner   Smoking status reviewed  Review of Systems  Per HPI, else denies chest pain, shortness of breath, s    Objective:  BP (!) 100/58   Pulse 70   Temp 98 F (36.7 C) (Oral)   Wt 193 lb (87.5 kg)   SpO2 97%   BMI 30.23 kg/m  Vitals and nursing note reviewed  General: NAD, well appearing Cardiac: RRR,  Respiratory: CTAB, normal effort Neuro: alert and oriented, no focal deficits   Assessment & Plan:    Diabetes mellitus with neuropathy A1c today - patient to record CBGs and bring them to next visit - will return earlier if continues to have high CBGs - diet and exercise discussed    Stanislaus Kaltenbach A. Lincoln Brigham MD, [facility] Resident PGY-3 Pager [phone]

## 2016-11-16 NOTE — Patient Instructions (Signed)
Follow up in 3 months for diabetes check You will be called for your A1c If you notice a lot of elevated blood sugars, make an appointment to sooner

## 2016-11-16 NOTE — Assessment & Plan Note (Addendum)
A1c today 8.1 - patient to record CBGs and bring them to next visit - will return earlier if continues to have high CBGs - diet and exercise discussed

## 2016-11-23 ENCOUNTER — Other Ambulatory Visit: Payer: Self-pay | Admitting: Obstetrics and Gynecology

## 2016-11-23 ENCOUNTER — Other Ambulatory Visit: Payer: Self-pay | Admitting: *Deleted

## 2016-11-23 MED ORDER — METHOCARBAMOL 500 MG PO TABS: ORAL_TABLET | ORAL | 2 refills | Status: DC

## 2016-12-14 ENCOUNTER — Other Ambulatory Visit: Payer: Self-pay | Admitting: *Deleted

## 2016-12-16 MED ORDER — PREGABALIN 100 MG PO CAPS: 100.0000 mg | ORAL_CAPSULE | Freq: Three times a day (TID) | ORAL | 5 refills | Status: DC

## 2017-01-02 ENCOUNTER — Encounter: Payer: Self-pay | Admitting: Student

## 2017-01-03 ENCOUNTER — Other Ambulatory Visit: Payer: Self-pay | Admitting: Family Medicine

## 2017-01-03 MED ORDER — METFORMIN HCL 500 MG PO TABS: 500.0000 mg | ORAL_TABLET | Freq: Every day | ORAL | 0 refills | Status: DC

## 2017-01-17 ENCOUNTER — Other Ambulatory Visit: Payer: Self-pay | Admitting: *Deleted

## 2017-01-17 MED ORDER — METHOCARBAMOL 500 MG PO TABS: ORAL_TABLET | ORAL | 2 refills | Status: DC

## 2017-01-19 ENCOUNTER — Encounter: Payer: Self-pay | Admitting: *Deleted

## 2017-01-19 ENCOUNTER — Other Ambulatory Visit: Payer: Self-pay | Admitting: *Deleted

## 2017-01-19 MED ORDER — PREGABALIN 100 MG PO CAPS: 100.0000 mg | ORAL_CAPSULE | Freq: Three times a day (TID) | ORAL | 5 refills | Status: DC

## 2017-01-19 NOTE — Telephone Encounter (Signed)
Please call lyrica into patient's pharmacy

## 2017-02-06 ENCOUNTER — Other Ambulatory Visit: Payer: Self-pay | Admitting: *Deleted

## 2017-02-06 MED ORDER — GLUCOSE BLOOD VI STRP: ORAL_STRIP | 5 refills | Status: DC

## 2017-02-08 ENCOUNTER — Ambulatory Visit (INDEPENDENT_AMBULATORY_CARE_PROVIDER_SITE_OTHER): Payer: Medicaid Other | Admitting: Psychology

## 2017-02-08 DIAGNOSIS — F3175 Bipolar disorder, in partial remission, most recent episode depressed: Secondary | ICD-10-CM

## 2017-02-08 NOTE — Progress Notes (Signed)
Reason for follow-up:  Patrick Elliott presents for his regular follow-up.    Issues discussed:  Brain fog for about 6 hours after he takes his medicine.  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  Reports feeling happy when painting.

## 2017-02-08 NOTE — Patient Instructions (Signed)
Please schedule a follow-up for:  September 19th at 11:30.  It was good to see you today Patrick Elliott.  And Patrick Elliott too.  Dr. Tammi Klippel recommended no changes in medicine.  The things with which you continue to struggle are indeed a struggle.  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

## 2017-02-08 NOTE — Assessment & Plan Note (Signed)
Patrick Elliott reports no difference in bothersome symptoms since last visit.  His affect seemed brighter than normal.  He smiled and laughed appropriately, asked good questions, and seemed engaged.  He remains difficult to treat from a therapy standpoint.  Medicines appear to be helping him manage his mood.  Function appears stable.  Will follow in three months.  Vyvanse refilled by Dr. Tammi Klippel today.

## 2017-02-09 LAB — HM DIABETES EYE EXAM

## 2017-02-13 ENCOUNTER — Other Ambulatory Visit: Payer: Self-pay | Admitting: Family Medicine

## 2017-03-02 ENCOUNTER — Other Ambulatory Visit: Payer: Self-pay | Admitting: *Deleted

## 2017-03-02 MED ORDER — LIRAGLUTIDE 18 MG/3ML ~~LOC~~ SOPN: PEN_INJECTOR | SUBCUTANEOUS | 3 refills | Status: DC

## 2017-03-02 NOTE — Telephone Encounter (Signed)
Rx refilled. Patient needs DM follow up appointment with home CBG records as previously planned. Can patient please be contacted to schedule?

## 2017-03-03 ENCOUNTER — Encounter: Payer: Self-pay | Admitting: *Deleted

## 2017-03-03 NOTE — Telephone Encounter (Signed)
Mychart message sent to patient. Jazmin Hartsell,CMA  

## 2017-03-07 ENCOUNTER — Other Ambulatory Visit: Payer: Self-pay | Admitting: *Deleted

## 2017-03-08 MED ORDER — CARVEDILOL 6.25 MG PO TABS: ORAL_TABLET | ORAL | 3 refills | Status: DC

## 2017-03-20 ENCOUNTER — Other Ambulatory Visit: Payer: Self-pay | Admitting: *Deleted

## 2017-03-21 MED ORDER — METHOCARBAMOL 500 MG PO TABS: ORAL_TABLET | ORAL | 0 refills | Status: DC

## 2017-03-21 MED ORDER — ACCU-CHEK MULTICLIX LANCETS MISC: 5 refills | Status: DC

## 2017-03-21 MED ORDER — INSULIN ASPART 100 UNIT/ML ~~LOC~~ SOLN: 3.0000 [IU] | Freq: Two times a day (BID) | SUBCUTANEOUS | 11 refills | Status: DC

## 2017-03-21 NOTE — Telephone Encounter (Signed)
Rx refilled. Patient need DM follow up appointment and discussion of why he is on robaxin since no documentation of reason in chart

## 2017-03-21 NOTE — Telephone Encounter (Signed)
Pt contacted and VM left informing pt of rx refill and to call back to schedule apt with new pcp.

## 2017-03-31 ENCOUNTER — Other Ambulatory Visit: Payer: Self-pay | Admitting: *Deleted

## 2017-03-31 MED ORDER — LISINOPRIL 20 MG PO TABS: ORAL_TABLET | ORAL | 0 refills | Status: DC

## 2017-03-31 MED ORDER — OMEPRAZOLE 20 MG PO CPDR: DELAYED_RELEASE_CAPSULE | ORAL | 0 refills | Status: DC

## 2017-03-31 NOTE — Telephone Encounter (Signed)
Rx refilled, Patient still needs follow up appointment

## 2017-04-04 NOTE — Telephone Encounter (Signed)
Detailed message left for pt informing of medication refills and the need for a FU apt with pcp. Please assist him in scheduling when he calls back.

## 2017-04-12 ENCOUNTER — Encounter: Payer: Self-pay | Admitting: Psychology

## 2017-04-12 ENCOUNTER — Other Ambulatory Visit: Payer: Self-pay | Admitting: Family Medicine

## 2017-04-13 NOTE — Telephone Encounter (Signed)
Refill was already given for 1 month supply on 03/20/17 and patient asked to come in for appointment for DM follow up and to discuss why he is on robaxin since no documentation of why in the chart. He does not have an appointment scheduled at this time so will not refill at this time.

## 2017-04-13 NOTE — Telephone Encounter (Signed)
LM for patient asking him to call back and schedule an appointment because we aren't able to approve his refills without one. Nisha Dhami,CMA

## 2017-04-23 ENCOUNTER — Other Ambulatory Visit: Payer: Self-pay | Admitting: Family Medicine

## 2017-04-26 NOTE — Telephone Encounter (Signed)
Rx refill will be given since patient has appt with me next week to discuss, will decide at that time whether or not this medication continues to be an appropriate course of treatment for him.

## 2017-04-27 ENCOUNTER — Ambulatory Visit (INDEPENDENT_AMBULATORY_CARE_PROVIDER_SITE_OTHER): Payer: Medicaid Other | Admitting: Sports Medicine

## 2017-04-27 DIAGNOSIS — M2170 Unequal limb length (acquired), unspecified site: Secondary | ICD-10-CM | POA: Diagnosis not present

## 2017-04-27 DIAGNOSIS — M19079 Primary osteoarthritis, unspecified ankle and foot: Secondary | ICD-10-CM

## 2017-04-27 DIAGNOSIS — R269 Unspecified abnormalities of gait and mobility: Secondary | ICD-10-CM | POA: Diagnosis present

## 2017-04-27 NOTE — Progress Notes (Signed)
   [facility] [address] [place], [place] [address] Phone: [phone] Fax: [phone]   Patient Name: Patrick Elliott Date of Birth: 1961-04-17 Medical Record Number: [id number] Gender: male Date of Encounter: 04/27/2017  History of Present Illness:  Patrick Elliott is a 56 y.o. 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 Was last evaluated by Dr. Oneida Alar on 09/21/2011 and at that point was noted to have midfoot degenerative changes with spurring of the lateral midfoot between the fifth metatarsophalangeal and cuboid    He has not made any significant changes recently but just notes that increased standing or walking increases pain in his left foot. He feels that his foot wants to turn over laterally when he is walking. Does endorse some numbness and tingling but this is a chronic problem for him in his lower extremities. He denies any weakness.  He has a large acquired leg length shortening of > 2.5 cms on left He is not using his lift or orthotic in some of his shoes  Past Medical, Surgical, Social, and Family History Reviewed. Medications and Allergies reviewed and all updated if necessary.  Review of Systems:  RT knee pain and stiffness Left foot pain at night  Physical Examination: Vitals:   04/27/17 1421  BP: 126/76   Vitals:   04/27/17 1421  Weight: 190 lb (86.2 kg)  Height: 5\' 7"  (1.702 m)   Body mass index is 29.76 kg/m.  General: well appearing 56 year old male in NAD Cardiac: well perfused Skin:  Chronic vascular changes on bilateral lower extremities Vascular: 2+ palpable pulses on PT and DP, extremities warm and well perfused Resp: NWOB MSK:   Legs and feet: Right leg approximately 2cm longer than left. Left foot with bony hypertrophy at the 5th MTP wih mod TTP. Feet with high arches bilaterally. Otherwise  no tenderness to palpation along metatarsals or metatarsal heads, navicular prominence, lateral or medial malleolus.  Right knee: Flexion contracture. No edema, medial/lateral joint line tenderness.  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 Additionally has leg-length inequality with the R being 2cm longer than left. According, patient has gait demonstrating positive trendelenburg and right knee with flexion contracture. Patient was given 3 types of lifts for his left leg .  padded with felt.  Also given a soft plastazoate sample diabetic orthotic to use on left only.  And recommended to continue daily neuropathic medication. Could also benefit from XR of left foot, given that his most recent was in 2011 and there has clearly been some progression of his mid-foot arthritis.   Best options are likely special shoes and inserts Consider referral to [employer] or similar orthotic offices  Daniel L. Rosalyn Gess, [facility] Resident PGY-2 04/27/2017 6:52 PM   I observed and examined the patient with the resident and agree with assessment and plan.  Note reviewed and modified by me. Stefanie Libel, MD

## 2017-04-28 ENCOUNTER — Other Ambulatory Visit: Payer: Self-pay | Admitting: Family Medicine

## 2017-04-28 NOTE — Assessment & Plan Note (Signed)
Patient shows progression of midfoot DJD since 2013 Lateral foot is basically fused and painful as he strikes in supination  Cushioned special soft orthotics are best bet and need to correct leg length

## 2017-04-28 NOTE — Assessment & Plan Note (Signed)
Needs to resume using lifts in all shoes to take off some pressure on shorter left leg

## 2017-05-01 ENCOUNTER — Ambulatory Visit: Payer: Self-pay | Admitting: Family Medicine

## 2017-05-03 ENCOUNTER — Encounter: Payer: Self-pay | Admitting: Family Medicine

## 2017-05-03 ENCOUNTER — Other Ambulatory Visit: Payer: Self-pay | Admitting: *Deleted

## 2017-05-03 ENCOUNTER — Telehealth: Payer: Self-pay

## 2017-05-04 MED ORDER — PREGABALIN 100 MG PO CAPS: 100.0000 mg | ORAL_CAPSULE | Freq: Three times a day (TID) | ORAL | 1 refills | Status: DC

## 2017-05-04 NOTE — Telephone Encounter (Signed)
Rx called in today.

## 2017-05-04 NOTE — Telephone Encounter (Signed)
Entered in error

## 2017-05-04 NOTE — Telephone Encounter (Signed)
2nd request.  Nemesis Rainwater L, RN  

## 2017-05-09 ENCOUNTER — Other Ambulatory Visit: Payer: Self-pay

## 2017-05-09 ENCOUNTER — Telehealth: Payer: Self-pay | Admitting: Psychology

## 2017-05-09 ENCOUNTER — Other Ambulatory Visit: Payer: Self-pay | Admitting: *Deleted

## 2017-05-09 ENCOUNTER — Telehealth: Payer: Self-pay

## 2017-05-09 DIAGNOSIS — M19079 Primary osteoarthritis, unspecified ankle and foot: Secondary | ICD-10-CM

## 2017-05-09 MED ORDER — INSULIN PEN NEEDLE 32G X 4 MM MISC: 2 refills | Status: DC

## 2017-05-09 NOTE — Telephone Encounter (Signed)
Order placed and pt informed.

## 2017-05-09 NOTE — Progress Notes (Signed)
Called patient and left a VM.  Dr. Tammi Klippel is not available for the [facility] follow-up tomorrow at Patrick Elliott's scheduled time.  Offered him 8:30 tomorrow or 10:30 on 10/3 as alternatives.  Asked him to call me back to confirm.

## 2017-05-12 ENCOUNTER — Ambulatory Visit (INDEPENDENT_AMBULATORY_CARE_PROVIDER_SITE_OTHER): Payer: Medicaid Other | Admitting: Family Medicine

## 2017-05-12 ENCOUNTER — Other Ambulatory Visit: Payer: Self-pay | Admitting: Family Medicine

## 2017-05-12 ENCOUNTER — Encounter: Payer: Self-pay | Admitting: Family Medicine

## 2017-05-12 VITALS — BP 142/78 | HR 93 | Temp 98.8°F | Wt 193.0 lb

## 2017-05-12 DIAGNOSIS — E1140 Type 2 diabetes mellitus with diabetic neuropathy, unspecified: Secondary | ICD-10-CM

## 2017-05-12 LAB — POCT GLYCOSYLATED HEMOGLOBIN (HGB A1C): Hemoglobin A1C: 7.8

## 2017-05-12 MED ORDER — METFORMIN HCL 1000 MG PO TABS: 1000.0000 mg | ORAL_TABLET | Freq: Two times a day (BID) | ORAL | 2 refills | Status: DC

## 2017-05-12 MED ORDER — INSULIN ASPART 100 UNIT/ML ~~LOC~~ SOLN: 8.0000 [IU] | Freq: Two times a day (BID) | SUBCUTANEOUS | 11 refills | Status: DC

## 2017-05-12 MED ORDER — INSULIN GLARGINE 100 UNIT/ML SOLOSTAR PEN: 24.0000 [IU] | PEN_INJECTOR | Freq: Every day | SUBCUTANEOUS | 11 refills | Status: DC

## 2017-05-12 NOTE — Patient Instructions (Addendum)
It was good to see you today!  For your diabetes,  - Continue lantus 24U daily  - Take humalog 8U with meals - Continue victoza, no changes - Take metformin 1000mg  twice a day   Check your morning fastings and 2 hours after meals. So you will be checking your blood sugar 4 times a day.   Please check-out at the front desk before leaving the clinic. Make an appointment in  1 week with blood glucose log and your home medications.   Sign up for My Chart to have easy access to your labs results, and communication with your primary care physician.  Feel free to call with any questions or concerns at any time, at [phone].   Take care,  Dr. Bufford Lope, [place]

## 2017-05-12 NOTE — Progress Notes (Signed)
    Subjective:  Patrick Elliott is a 56 y.o. male who presents to the [facility] today for diabetes follow up  HPI:  Diabetes - Has been trying to manage his diabetes at home. Is compliant on victoza 1.8mg  qd daily and metformin 500mg  BID but was uncertain of his lantus and novolog doses so self titrated up on his lantus to 24U qd. - For his novolog he is uncertain of his dose because he has been checking in sugars frequently and giving himself several doses of novolog both before and after meals as he has noticed his blood glucose was high. His typical pattern is to check, find readings in high 200s-300s, given himself 10U novolog, recheck and given himself an additional 5-10U and repeat for a few more times before awaiting his next meal - Has his home monitor with him today and has noticed that sugars are wide rangings from 120-400, avg in mid 200s. - Feels symptomatic from any below 150 with shakes and sweats - Would like to know if he is a candidate for free style libre meter (continuous monitoring) - does have some polydipsia sometimes but denies polyuria or new vision changes   ROS: Per HPI  Objective:  Physical Exam: BP (!) 142/78   Pulse 93   Temp 98.8 F (37.1 C) (Oral)   Wt 193 lb (87.5 kg)   SpO2 98%   BMI 30.23 kg/m   Gen: NAD, resting comfortably CV: RRR with no murmurs appreciated Pulm: NWOB, CTAB with no crackles, wheezes, or rhonchi GI: Normal bowel sounds present. Soft, Nontender, Nondistended. MSK: no edema, cyanosis, or clubbing noted Skin: warm, dry Neuro: grossly normal, moves all extremities Psych: Normal affect and thought content  Results for orders placed or performed in visit on 05/12/17 (from the past 72 hour(s))  HgB A1c     Status: None   Collection Time: 05/12/17  4:08 PM  Result Value Ref Range   Hemoglobin A1C 7.8      Assessment/Plan:  Diabetes mellitus with neuropathy Uncontrolled. a1c improved to 7.8 from 8.1 today but expect this is due to  hypoglycemic episodes patient is having at home given his wide ranging home readings and avg in the mid-high 200s. Discussed with patient that will need to have more frequent visits and regular monitoring at home to get him under better control. Since lowest fasting readings in monitor were all above 120, will continue the lantus 24U qd and the victoza. Increase metformin to max dose of 1000mg  BID. Start novolog at consistent 8U with meals. Patient to follow up in 1 week with morning fastings and 2hr post prandials readings.   Bufford Lope, DO PGY-2, [facility] 05/12/2017 4:20 PM

## 2017-05-14 NOTE — Assessment & Plan Note (Signed)
Uncontrolled. a1c improved to 7.8 from 8.1 today but expect this is due to hypoglycemic episodes patient is having at home given his wide ranging home readings and avg in the mid-high 200s. Discussed with patient that will need to have more frequent visits and regular monitoring at home to get him under better control. Since lowest fasting readings in monitor were all above 120, will continue the lantus 24U qd and the victoza. Increase metformin to max dose of 1000mg  BID. Start novolog at consistent 8U with meals. Patient to follow up in 1 week with morning fastings and 2hr post prandials readings.

## 2017-05-16 ENCOUNTER — Other Ambulatory Visit: Payer: Self-pay | Admitting: *Deleted

## 2017-05-16 DIAGNOSIS — E785 Hyperlipidemia, unspecified: Secondary | ICD-10-CM

## 2017-05-16 DIAGNOSIS — E1140 Type 2 diabetes mellitus with diabetic neuropathy, unspecified: Secondary | ICD-10-CM

## 2017-05-16 DIAGNOSIS — E118 Type 2 diabetes mellitus with unspecified complications: Secondary | ICD-10-CM

## 2017-05-16 DIAGNOSIS — Z794 Long term (current) use of insulin: Principal | ICD-10-CM

## 2017-05-17 MED ORDER — SIMVASTATIN 20 MG PO TABS: 20.0000 mg | ORAL_TABLET | Freq: Every day | ORAL | 3 refills | Status: DC

## 2017-05-17 MED ORDER — INSULIN GLARGINE 100 UNIT/ML SOLOSTAR PEN: 24.0000 [IU] | PEN_INJECTOR | Freq: Every day | SUBCUTANEOUS | 11 refills | Status: DC

## 2017-05-17 MED ORDER — GLUCOSE BLOOD VI STRP: ORAL_STRIP | 5 refills | Status: DC

## 2017-05-18 ENCOUNTER — Telehealth: Payer: Self-pay | Admitting: *Deleted

## 2017-05-18 NOTE — Telephone Encounter (Signed)
Patient left message on nurse line stating he increased metformin to 2000 mg daily as instructed and is having headache and nausea and is unable to tolerate it. Requesting call from PCP. Hubbard Hartshorn, RN, BSN

## 2017-05-19 ENCOUNTER — Encounter: Payer: Self-pay | Admitting: Family Medicine

## 2017-05-19 NOTE — Telephone Encounter (Signed)
Patient also sent mychart message, responded to via mychart.

## 2017-05-22 ENCOUNTER — Ambulatory Visit: Payer: Medicaid Other | Admitting: Family Medicine

## 2017-05-24 ENCOUNTER — Ambulatory Visit (INDEPENDENT_AMBULATORY_CARE_PROVIDER_SITE_OTHER): Payer: Medicaid Other | Admitting: Psychology

## 2017-05-24 ENCOUNTER — Encounter: Payer: Self-pay | Admitting: Psychology

## 2017-05-24 ENCOUNTER — Ambulatory Visit
Admission: RE | Admit: 2017-05-24 | Discharge: 2017-05-24 | Disposition: A | Discharge status: Home / Self Care | Payer: Medicaid Other | Source: Ambulatory Visit | Attending: Sports Medicine | Admitting: Sports Medicine

## 2017-05-24 DIAGNOSIS — M19079 Primary osteoarthritis, unspecified ankle and foot: Secondary | ICD-10-CM

## 2017-05-24 DIAGNOSIS — F900 Attention-deficit hyperactivity disorder, predominantly inattentive type: Secondary | ICD-10-CM | POA: Diagnosis not present

## 2017-05-24 DIAGNOSIS — F3175 Bipolar disorder, in partial remission, most recent episode depressed: Secondary | ICD-10-CM | POA: Diagnosis not present

## 2017-05-24 NOTE — Assessment & Plan Note (Addendum)
Appears tired.  This is an earlier appointment than usual for him.  Reports there has been no significant change in mood.  No evidence of SI.  Did not bring up mood swings on his list today.  Keep medication the same.    Dr. Tammi Klippel will refill 6 months worth of Ambien.  Due for refills 11/2016.

## 2017-05-24 NOTE — Patient Instructions (Signed)
Please schedule a follow-up for:  January 2nd at 11:30.  It was good to see you today Patrick Elliott.  I hope you have a good time with your mother.  Dr. Tammi Klippel will mail you six months worth of Vyvanse and Ambien.  I am sorry you aren't getting the number of hours of attention and focus with the Vyvanse as we would like.  Please call me if you need anything.

## 2017-05-24 NOTE — Assessment & Plan Note (Signed)
Continues to struggle with attention and focus.  Vyvanse helps for about 6 hours which is less than what we hope.  Still deemed beneficial.  Dr. Tammi Klippel will mail 6 months worth of scripts.  Patrick Elliott states he has one left at [facility].  Should be good until 12/2016.

## 2017-05-24 NOTE — Progress Notes (Signed)
Reason for follow-up:  Patrick Elliott presented for follow-up of mood and attention issues.  Issues discussed:  He has a list similar to past visits.  Difficulty with focus, bouncing all over the place, and tiring easily.  His Vyvanse allows him greater focus for about 6 hours.    He reports [name] has a new job with [employer] and recently bought a home in the [place].

## 2017-05-25 ENCOUNTER — Ambulatory Visit (INDEPENDENT_AMBULATORY_CARE_PROVIDER_SITE_OTHER): Payer: Medicaid Other | Admitting: Pharmacist

## 2017-05-25 DIAGNOSIS — E118 Type 2 diabetes mellitus with unspecified complications: Secondary | ICD-10-CM

## 2017-05-25 DIAGNOSIS — E1140 Type 2 diabetes mellitus with diabetic neuropathy, unspecified: Secondary | ICD-10-CM

## 2017-05-25 DIAGNOSIS — Z794 Long term (current) use of insulin: Secondary | ICD-10-CM | POA: Diagnosis not present

## 2017-05-25 MED ORDER — INSULIN ASPART 100 UNIT/ML ~~LOC~~ SOLN: SUBCUTANEOUS | 11 refills | Status: DC

## 2017-05-25 MED ORDER — GLUCAGON (RDNA) 1 MG IJ KIT: PACK | INTRAMUSCULAR | 2 refills | Status: DC

## 2017-05-25 MED ORDER — GLUCOSE BLOOD VI STRP: ORAL_STRIP | 5 refills | Status: DC

## 2017-05-25 MED ORDER — INSULIN GLARGINE 100 UNIT/ML SOLOSTAR PEN: 26.0000 [IU] | PEN_INJECTOR | Freq: Every day | SUBCUTANEOUS | 11 refills | Status: DC

## 2017-05-25 NOTE — Assessment & Plan Note (Signed)
Diabetes longstanding currently uncontrolled. Patient denies hypoglycemic events and is able to verbalize appropriate hypoglycemia management plan. Patient reports adherence with medications. Control is suboptimal due to sedentary lifestyle and sporadically changing insulin requirements per patient report.  Increased dose of basal insulin Lantus (insulin glargine) to 26 units once a day. Increased dose of rapid insulin Novolog (insulin aspart) to 8 units with lunch and 10 units with supper. - Continued Victoza (liraglutide) at 1.8 mg daily.

## 2017-05-25 NOTE — Patient Instructions (Signed)
Thank you for coming to see [place] today!   1. Change your novolog to 8 units with lunch and 10 units with supper   2. Increase your Lantus to 26 units once a day  3. Refill you glucagon  Come back to see [place] in a few weeks, we can talk more about CGM then.

## 2017-05-25 NOTE — Progress Notes (Signed)
   S:    Chief Complaint  Patient presents with  . Medication Management    Type 2 Diabetes   Patient arrives in fair spirits. Presents for diabetes evaluation, education, and he is ambulating on his own.  Patient has been seen in pharmacy clinic multiple times in the past however I have not seen this patient for diabetes management for quite some time. Patient was last seen by Primary Care Provider on 05/12/17 with Dr. Shawna Orleans, where insulin aspart was increased from 3-7 units twice daily with meals to 8 units twice daily with meals. Insulin glargine was also increased from 18 units daily at 10 pm to 24 units daily. A1c at this time reported to be 7.8.   He states he is living with his girlfriend whom is vegan, so he has been eating a lot of Tofu lately and has been adapting to a healthier diet. He also stays up late and sleeps in a great deal and does not usually get to his first dose of medications until 2pm.   Patient reports adherence with medications.  Current diabetes medications include: insulin glargine 26 units once daily, insulin aspart 8 units twice daily with meals, Victoza 1.8 mg daily, metformin 1,000 mg twice daily Current hypertension medications include:  Carvedilol 6.25 twice daily, lisinopril 20 mg daily   Patient denies hypoglycemic events in the past month. Asymptomatic at 150 mg/dL, shakiness at 125, and tunnel vision at 100. Likes to keep his sugars around 150 mg/dL. Glucagon use has been minimal - denies recent use.   In fact his current pen was out of date.   Patient reported dietary habits: Eats 3 meals/day (6-8 yogurts/day) Breakfast: yogurt, apple Lunch: fruit Dinner: linguine, fish tacos, pad thai Snacks: yogurt Drinks: cran-cherry diet fruit juices, diet sodas, water   Patient reported exercise habits: Walks 1-2x per day with dog    Patient denies nocturia.  Patient denies neuropathy. Patient denies visual changes. Patient reports self foot exams.   O:    Physical Exam ROS  Lab Results  Component Value Date   HGBA1C 7.8 05/12/2017   Home fasting CBG: 200-240 Lunch time postprandial 180-200 2 hour post-prandial/random CBG: 220-250.  A/P: Diabetes longstanding currently uncontrolled. Patient denies hypoglycemic events and is able to verbalize appropriate hypoglycemia management plan. Patient reports adherence with medications. Control is suboptimal due to sedentary lifestyle and sporadically changing insulin requirements per patient report.  Increased dose of basal insulin Lantus (insulin glargine) to 26 units once a day. Increased dose of rapid insulin Novolog (insulin aspart) to 8 units with lunch and 10 units with supper. - Continued Victoza (liraglutide) at 1.8 mg daily.   Interested in potential CGM - consider at next visit.  Next A1C anticipated 07/2017.    Written patient instructions provided.  Total time in face to face counseling 30 minutes.   Follow up in Pharmacist Clinic Visit in a few weeks.   Patient seen with Ulanda Edison, PharmD PGY1 Pharmacy Resident and Deirdre Pippins, PharmD PGY2 Pharmacy Resident

## 2017-05-29 NOTE — Progress Notes (Signed)
Patient ID: Patrick Elliott, male   DOB: [birth year], 56 y.o.   MRN: [id number] Reviewed: Agree with Dr. Graylin Shiver documentation and management.

## 2017-05-30 ENCOUNTER — Encounter: Payer: Self-pay | Admitting: Family Medicine

## 2017-05-30 ENCOUNTER — Other Ambulatory Visit: Payer: Self-pay | Admitting: *Deleted

## 2017-05-30 MED ORDER — METFORMIN HCL 500 MG PO TABS: 500.0000 mg | ORAL_TABLET | Freq: Two times a day (BID) | ORAL | 2 refills | Status: DC

## 2017-06-05 ENCOUNTER — Encounter: Payer: Self-pay | Admitting: Family Medicine

## 2017-06-05 ENCOUNTER — Other Ambulatory Visit: Payer: Self-pay | Admitting: Family Medicine

## 2017-06-05 MED ORDER — PREGABALIN 100 MG PO CAPS: 100.0000 mg | ORAL_CAPSULE | Freq: Three times a day (TID) | ORAL | 2 refills | Status: DC

## 2017-06-05 NOTE — Telephone Encounter (Signed)
Patient is calling because he is needing a refill on Lyrica. However, the refills were sent under Dr. Etta Grandchild name and the pharmacy will not fill until current PCP send in Rx with name on it for [facility] billing.  Please advise.   Marland Kitchenemmd

## 2017-06-22 ENCOUNTER — Other Ambulatory Visit: Payer: Self-pay | Admitting: Family Medicine

## 2017-06-22 ENCOUNTER — Ambulatory Visit (INDEPENDENT_AMBULATORY_CARE_PROVIDER_SITE_OTHER): Payer: Medicaid Other | Admitting: Sports Medicine

## 2017-06-22 ENCOUNTER — Ambulatory Visit: Payer: Self-pay | Admitting: Pharmacist

## 2017-06-22 DIAGNOSIS — M2170 Unequal limb length (acquired), unspecified site: Secondary | ICD-10-CM

## 2017-06-22 DIAGNOSIS — R269 Unspecified abnormalities of gait and mobility: Secondary | ICD-10-CM

## 2017-06-22 NOTE — Assessment & Plan Note (Signed)
Lift correction to left insoles  These are foam rubber  Feel comfortable
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
## 2017-06-22 NOTE — Assessment & Plan Note (Signed)
signficant trendelenburg  With soft insole there is more shift In custom orthotics less shift  Use the ones we made today Once he runs out of these consider new custom pair

## 2017-06-29 ENCOUNTER — Encounter: Payer: Self-pay | Admitting: Psychology

## 2017-07-03 NOTE — Telephone Encounter (Signed)
Called in 10 mg Ambien #30 with [facility] [place] per Dr. Johny Shears request.  Will let [name] know.

## 2017-07-05 ENCOUNTER — Other Ambulatory Visit: Payer: Self-pay | Admitting: Family Medicine

## 2017-07-05 ENCOUNTER — Encounter: Payer: Self-pay | Admitting: Psychology

## 2017-07-05 ENCOUNTER — Encounter: Payer: Self-pay | Admitting: Family Medicine

## 2017-07-05 DIAGNOSIS — Z794 Long term (current) use of insulin: Principal | ICD-10-CM

## 2017-07-05 DIAGNOSIS — E1140 Type 2 diabetes mellitus with diabetic neuropathy, unspecified: Secondary | ICD-10-CM

## 2017-07-05 MED ORDER — ACCU-CHEK FASTCLIX LANCET KIT: PACK | 6 refills | Status: DC

## 2017-07-05 MED ORDER — BLOOD GLUCOSE MONITORING SUPPL KIT: 1.0000 | PACK | 0 refills | Status: AC

## 2017-07-05 MED ORDER — ACCU-CHEK FASTCLIX LANCETS MISC: 6 refills | Status: DC

## 2017-07-05 MED ORDER — GLUCOSE BLOOD VI STRP: ORAL_STRIP | 12 refills | Status: DC

## 2017-07-06 ENCOUNTER — Ambulatory Visit: Payer: Medicaid Other | Admitting: Pharmacist

## 2017-07-06 ENCOUNTER — Encounter: Payer: Self-pay | Admitting: Pharmacist

## 2017-07-06 DIAGNOSIS — Z794 Long term (current) use of insulin: Secondary | ICD-10-CM

## 2017-07-06 DIAGNOSIS — E1140 Type 2 diabetes mellitus with diabetic neuropathy, unspecified: Secondary | ICD-10-CM | POA: Diagnosis not present

## 2017-07-06 MED ORDER — GLUCOSE BLOOD VI STRP: ORAL_STRIP | 12 refills | Status: DC

## 2017-07-06 MED ORDER — ACCU-CHEK FASTCLIX LANCETS MISC: 6 refills | Status: DC

## 2017-07-06 MED ORDER — FREESTYLE LIBRE 14 DAY READER DEVI: 1.0000 | Freq: Once | 0 refills | Status: AC

## 2017-07-06 MED ORDER — FREESTYLE LIBRE 14 DAY SENSOR MISC: 1.0000 | 11 refills | Status: DC

## 2017-07-06 NOTE — Patient Instructions (Addendum)
Thanks for coming to see [place],   No changes to your insulin.  We will send in a prescription for the [facility] continuous blood glucose monitor. We may have to do some paperwork for this but we will try to get it for you.   Come back to see [place] in January after you see Dr. Tammi Klippel, if you get the Freestyle sooner make an earlier appointment and we will show you how to use it.

## 2017-07-06 NOTE — Progress Notes (Signed)
    S:     Chief Complaint  Patient presents with  . Medication Management    Diabetes    Patient arrives in good spirits, ambulating without assistance.  Presents for diabetes evaluation, education, and management at the request of Dr. Shawna Orleans. Patient is known to pharmacy clinic, but only recently has started seeing me again. Last seen in pharmacy clinic on 05/25/2017 - at that time, novolog was adjusted and lantus was increased.   Today, patient reports he thinks he's doing OK. Brings in meter for review. Checking CBGs at least five times daily, often seven. Frequently adjusts sliding scale insulin and thinks 10 units is too much for meal time insulin. Very wary of hypoglycemia given history. Nervous to go to sleep if CBGs 150 or lower. He reports significant day-to-day variances in insulin requirements with the same type of meal. Has heard about [employer] and is interested. Requests that we send enough test strips to test up to 5 times daily.   Family/Social History: Lives with girlfriend who is vegan, this influences food choices. Planning vegan thanksgiving.  Patient reports adherence with medications.  Current diabetes medications include: insulin glargine 26 units once daily, insulin aspart 4-10 units twice daily with meals, Victoza 1.8 mg daily, metformin 1,000 mg twice daily Current hypertension medications include:  Carvedilol 6.25 twice daily, lisinopril 20 mg daily   Patient denies hypoglycemic events. Feels low around 120-130.   O:  Physical Exam  Constitutional: He appears well-developed and well-nourished.   Review of Systems  All other systems reviewed and are negative.  Lab Results  Component Value Date   HGBA1C 7.8 05/12/2017   Home fasting CBG: 150s-260s  2 hour post-prandial breakfast: 160s-200s 2 hour post-prandial lunch: 140s-200s Bedtime/2 hour post-prandial supper: 160s-250s  A/P: Diabetes longstanding currently uncontrolled, but improved by CBG log.  Glycemic targets more conservative with this patient given history of severe and frequent hypoglycemia requiring glucagon and overall brittle picture. Patient denies hypoglycemic events and is able to verbalize appropriate hypoglycemia management plan. Patient reports adherence with medications. Control is suboptimal due to sedentary lifestyle, sporadically changing insulin requirements, and relative hypoglycemia at euglycemic levels.  -No change to DM regimen at this time.  -Consider Fiasp in the future - patient may benefit from extremely fast acting prandial insulin given patient-perceived change in day-to-day insulin requirements.   -Sent Rx for test strips up to 5 times daily. -Sent Rx for 14 day Freestyle [employer]. Pt to see about affordability on insurance.  Next A1C anticipated 08/11/2017 or later.    Written patient instructions provided.  Total time in face to face counseling 30 minutes.   Follow up in Pharmacist Clinic Visit in January 2019 or sooner if he is able to obtain [employer] product and bring in for teaching. Patient seen with Deirdre Pippins, PharmD, PGY2 Resident.

## 2017-07-06 NOTE — Assessment & Plan Note (Signed)
Diabetes longstanding currently uncontrolled, but improved by CBG log. Glycemic targets more conservative with this patient given history of severe and frequent hypoglycemia requiring glucagon and overall brittle picture. Patient denies hypoglycemic events and is able to verbalize appropriate hypoglycemia management plan. Patient reports adherence with medications. Control is suboptimal due to sedentary lifestyle, sporadically changing insulin requirements, and relative hypoglycemia at euglycemic levels.  -No change to DM regimen at this time.  -Consider Fiasp in the future - patient may benefit from extremely fast acting prandial insulin given patient-perceived change in day-to-day insulin requirements.   -Sent Rx for test strips up to 5 times daily. -Sent Rx for 14 day Freestyle [employer]. Pt to see about affordability on insurance.

## 2017-07-07 NOTE — Progress Notes (Signed)
Patient ID: Patrick Elliott, male   DOB: 03/16/1961, 56 y.o.   MRN: [id number] Reviewed: Agree with Dr. Graylin Shiver documentation and management.

## 2017-07-12 ENCOUNTER — Other Ambulatory Visit: Payer: Self-pay | Admitting: Family Medicine

## 2017-07-12 ENCOUNTER — Telehealth: Payer: Self-pay | Admitting: Psychology

## 2017-07-12 DIAGNOSIS — E118 Type 2 diabetes mellitus with unspecified complications: Secondary | ICD-10-CM

## 2017-07-12 DIAGNOSIS — Z794 Long term (current) use of insulin: Principal | ICD-10-CM

## 2017-07-12 MED ORDER — "INSULIN SYRINGE 30G X 5/16"" 0.5 ML MISC": 4.0000 [IU] | Freq: Three times a day (TID) | 3 refills | Status: DC

## 2017-07-12 MED ORDER — INSULIN PEN NEEDLE 32G X 4 MM MISC: 2 refills | Status: DC

## 2017-07-12 NOTE — Telephone Encounter (Signed)
Patient needed refill of Lithium.  Dr. Tammi Klippel authorized.  [facility]  Lithium 450 mg CR #30 with 11 refills.

## 2017-07-18 ENCOUNTER — Other Ambulatory Visit: Payer: Self-pay | Admitting: Family Medicine

## 2017-07-19 ENCOUNTER — Other Ambulatory Visit: Payer: Self-pay | Admitting: Family Medicine

## 2017-07-19 DIAGNOSIS — Z794 Long term (current) use of insulin: Principal | ICD-10-CM

## 2017-07-19 DIAGNOSIS — E118 Type 2 diabetes mellitus with unspecified complications: Secondary | ICD-10-CM

## 2017-07-19 MED ORDER — INSULIN PEN NEEDLE 32G X 4 MM MISC: 2 refills | Status: DC

## 2017-08-06 ENCOUNTER — Other Ambulatory Visit: Payer: Self-pay | Admitting: Family Medicine

## 2017-08-07 ENCOUNTER — Other Ambulatory Visit: Payer: Self-pay | Admitting: Family Medicine

## 2017-08-07 MED ORDER — NORTRIPTYLINE HCL 25 MG PO CAPS: 50.0000 mg | ORAL_CAPSULE | Freq: Every day | ORAL | 2 refills | Status: DC

## 2017-08-23 ENCOUNTER — Ambulatory Visit (INDEPENDENT_AMBULATORY_CARE_PROVIDER_SITE_OTHER): Payer: Medicaid Other | Admitting: Psychology

## 2017-08-23 DIAGNOSIS — F3175 Bipolar disorder, in partial remission, most recent episode depressed: Secondary | ICD-10-CM

## 2017-08-23 DIAGNOSIS — F900 Attention-deficit hyperactivity disorder, predominantly inattentive type: Secondary | ICD-10-CM

## 2017-08-23 NOTE — Assessment & Plan Note (Signed)
Dr. Tammi Klippel wrote Vyvanse scripts for 2/21 and 3/21 which should carry him through 4/21st.

## 2017-08-23 NOTE — Patient Instructions (Signed)
Please schedule a follow-up for 4/3 at 11:30.  I'll try to put it in the computer correctly this time!  Good to see you Patrick Elliott.  If you end up being pulled too far down that hole, please call me at [phone] and we can get you in sooner.  Otherwise will see you in three months.

## 2017-08-23 NOTE — Assessment & Plan Note (Addendum)
Report of mood is depressed.  Attempted to get a better sense of this but his attention is so short.  He has trouble focusing enough to give sufficient information to understand the situation better.  He does acknowledge that he has managed to pull away from the depression thus far.    Part of his description - specifically his concerns about losing contact with reality sound to Dr. 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  He describes brief perceptual disturbances that are hallucinogenic like.  No evidence of suicidal ideation.  Has a supportive partner and family.  Encouraged to call if things deteriorate further.

## 2017-08-23 NOTE — Progress Notes (Signed)
Reason for follow-up:  Continued mood management.  Issues discussed:  Patrick Elliott reported feeling a "magnet" of depression pulling him to a black hole.  He wonders where the line is between depression and loss of contact with reality.  He also wonders how to keep from getting there.  He reports he has been successful so far but is unsure of the strategies he has been using.    Reports loss of contact with reality when using drugs (remotely). Also reports one time where he thinks he did this while getting clean from drugs.    Discussed "flashbacks" of drug use.

## 2017-08-25 ENCOUNTER — Other Ambulatory Visit: Payer: Self-pay | Admitting: Student in an Organized Health Care Education/Training Program

## 2017-08-25 NOTE — Progress Notes (Signed)
Received fax refill request for accucheck aviva plus meter. Refill fax was returned.  Everrett Coombe, MD PGY-2 [facility]

## 2017-09-02 NOTE — Progress Notes (Signed)
Cancelling order as lab not collected, no need to re-order 

## 2017-09-21 ENCOUNTER — Ambulatory Visit: Payer: Medicaid Other | Admitting: Pharmacist

## 2017-09-21 ENCOUNTER — Encounter: Payer: Self-pay | Admitting: Pharmacist

## 2017-09-21 DIAGNOSIS — E1140 Type 2 diabetes mellitus with diabetic neuropathy, unspecified: Secondary | ICD-10-CM | POA: Diagnosis not present

## 2017-09-21 DIAGNOSIS — E118 Type 2 diabetes mellitus with unspecified complications: Secondary | ICD-10-CM | POA: Diagnosis present

## 2017-09-21 DIAGNOSIS — Z794 Long term (current) use of insulin: Secondary | ICD-10-CM | POA: Diagnosis not present

## 2017-09-21 MED ORDER — INSULIN GLARGINE 100 UNIT/ML SOLOSTAR PEN: 28.0000 [IU] | PEN_INJECTOR | Freq: Every day | SUBCUTANEOUS | 11 refills | Status: DC

## 2017-09-21 MED ORDER — INSULIN ASPART 100 UNIT/ML FLEXPEN: 8.0000 [IU] | PEN_INJECTOR | Freq: Two times a day (BID) | SUBCUTANEOUS | 11 refills | Status: DC

## 2017-09-21 NOTE — Progress Notes (Signed)
    S:     Chief Complaint  Patient presents with  . Medication Management    T2DM   Patient arrives in good spirits and ambulating without assistance.  Presents is a longstanding patient of Rx clinic and presents for diabetes evaluation, education, and management at the request of PCP Dr. Shawna Orleans. Patient was last seen in Rx clinic on 05/25/2017 and is well known to Rx clinic. Patient still considering affordability of Freestyle libre meter. Denies use of glucagon since last visit, but reports episode of hypoglycemia about one week ago in the afternoon where he considered using glucagon. Patient reports chronic low level stress that is baseline for him. Patient brings in meter for review.   Family/Social History: no longer lives with girlfriend, but still in relationship  Patient reports adherence with medications.  Current diabetes medications include: metformin 500 BID, Lantus 26 units daily, Novolog 5-12 units daily with meals twice daily, Victoza 1.8mg  daily Current hypertension medications include: carvedilol 6.25 twice daily, lisinopril 20mg  daily.   Patient reports hypoglycemic events and concern for nocturnal hypoglycemia if CBGs are less than 150 at bedtime. Patient notes no pattern to lows or highs in sugar levels. States he administers correction bolus of up to 3 units novolog when sugars are around 260 after checking in afternoon. Reports he often gives correction boluses closer than 4 hours from last bolus. States he often waits until after eating to give meal time bolus in case he "gets distracted" and doesn't finish the meal to avoid hypoglycemia.   Patient rreports eported dietary habits: Eats 2 meals/day. Reports use of insulin when eating meals containing noodles and rice or other carb. Patient gives example of insulin dosing on his sliding scale: FBG 260s and about to eat breakfast - 8 units; If running high (200s) and is not eating - 3 units for correction bolus.    Breakfast:english muffin Dinner:soup  O:  Physical Exam  Constitutional: He appears well-developed and well-nourished.     Review of Systems  All other systems reviewed and are negative.    Lab Results  Component Value Date   HGBA1C 7.8 05/12/2017   Home fasting CBG: 190-225 per patient  2 hour post-prandial/random CBG: 250-280.  A/P: Diabetes longstanding currently uncontrolled as evidenced by CBG readings and complicated by pancreatic insufficiency and suspected diminished ability to produce endogenous glucagon given history of multiorgan system failure and glycemic patterns. Patient reports hypoglycemic events and is able to verbalize appropriate hypoglycemia management plan. Patient reports adherence with medication however not optimally timing to meal time insulin. Control is suboptimal due to disease progression and patient history/fear of symptomatic hypoglycemia.  Increased dose of basal insulin Lantus (insulin glargine) to 28 units daily.  D/c novolog, started ultra rapid acting Fiasp (insulin aspart) 8-12 units with meals. Will submit prior authorization for [place] Medicaid. Patient educated on purpose, proper use and potential adverse effects of hypoglycemia.  Following instruction patient verbalized understanding of treatment plan.  Next A1C anticipated 11/2017 - deferring as do not think updated A1C would inform therapeutic decision making at this time.   Written patient instructions provided.  Total time in face to face counseling 45 minutes.   Follow up in Pharmacist Clinic Visit 3-4 weeks.   Patient seen with Onnie Boer, PharmD Candidate and Deirdre Pippins, PGY2 Pharmacy Resident, PharmD, BCPS.

## 2017-09-21 NOTE — Patient Instructions (Signed)
Good to see you!   1. Increase your Lantus to 28 units once a day 2. CHANGE your meal time insulin to Fiasp - keep giving yourself the usual dose, 8-12 units with meals. Wait at least 3.5-4 hours to give yourself another shot for a correction dose of Fiasp. Take this RIGHT AS you are eating or immediately after eating. We will submit a prior authorization to Medicaid so this may take a few days to "go through".   Learn this new meal time insulin and how it reacts in your body. Come back to see [place] in 3-4 weeks so we can look at your blood sugars.   If you decide to get the Freestyle Libre Continuous Glucose Meter, let us know and we will teach you how to use it.

## 2017-09-21 NOTE — Assessment & Plan Note (Signed)
Diabetes longstanding currently uncontrolled as evidenced by CBG readings and complicated by pancreatic insufficiency and suspected diminished ability to produce endogenous glucagon given history of multiorgan system failure and glycemic patterns. Patient reports hypoglycemic events and is able to verbalize appropriate hypoglycemia management plan. Patient reports adherence with medication however not optimally timing to meal time insulin. Control is suboptimal due to disease progression and patient history/fear of symptomatic hypoglycemia.  Increased dose of basal insulin Lantus (insulin glargine) to 28 units daily.  D/c novolog, started ultra rapid acting Fiasp (insulin aspart) 8-12 units with meals. Will submit prior authorization for [place] Medicaid. Patient educated on purpose, proper use and potential adverse effects of hypoglycemia.  Following instruction patient verbalized understanding of treatment plan.  Next A1C anticipated 11/2017 - deferring as do not think updated A1C would inform therapeutic decision making at this time.

## 2017-09-22 NOTE — Progress Notes (Signed)
Patient ID: Patrick Elliott, male   DOB: Jun 01, 1961, 57 y.o.   MRN: [id number] Reviewed: Agree with Dr. Graylin Shiver documentation and management.

## 2017-10-03 ENCOUNTER — Telehealth: Payer: Self-pay | Admitting: Psychology

## 2017-10-03 NOTE — Telephone Encounter (Signed)
Called Patrick Elliott to let him know that [facility] is no longer available.  He was concerned about his medications - specifically, starting over with someone new.  Discussed options and I told him I would talk it over with Dr. Nori Riis who is heading up this transition from the medical standpoint.    Current medications that [facility] was precribing: Lithium 450 mg Lamotrigine 100 mg Vyvanse 70 mg Ambien 10 mg Nortriptyline 50 mg  I told [name] I would call him back with in the week.

## 2017-10-03 NOTE — Telephone Encounter (Signed)
Spoke with Dr. Nori Riis and then with Dr. Tammi Klippel.  Dr. Tammi Klippel stated that he would be able to see [facility].  I called [name] to give him the information.  He and his partner [name] would still like to see me.  They had an appointment scheduled for 4/3 for [facility].  Elected to keep that and touch base.  He should have been able to see Dr. Tammi Klippel in between so they can update me on that process.  [name] and [name] both expressed gratitude for the care that we took of Patrick Elliott in [facility] and appreciated the proactive approach to getting him connected beyond this clinic.

## 2017-10-05 ENCOUNTER — Other Ambulatory Visit: Payer: Self-pay | Admitting: Family Medicine

## 2017-10-06 ENCOUNTER — Telehealth: Payer: Self-pay

## 2017-10-06 NOTE — Telephone Encounter (Signed)
Prior approval for [place] completed via [employer]. Prior approval  #[id number]. Confirmation #: [id number]   [facility] informed. Danley Danker, RN [facility] RN)

## 2017-10-07 ENCOUNTER — Other Ambulatory Visit: Payer: Self-pay | Admitting: Family Medicine

## 2017-10-09 ENCOUNTER — Telehealth: Payer: Self-pay

## 2017-10-09 NOTE — Telephone Encounter (Signed)
Prior approval for Lyrica completed via [employer] [employer].  Med approved for 10/09/17 - 10/09/18.  Prior approval [id number]. [facility]. Danley Danker, RN [facility] RN)

## 2017-10-09 NOTE — Telephone Encounter (Signed)
Received fax from [place] requesting prior authorization of Lyrica . Form placed in MD's box for completion along with Medicaid formulary. Danley Danker, RN [facility] RN)

## 2017-10-09 NOTE — Telephone Encounter (Signed)
Form completed and returned to RN clinic

## 2017-10-14 ENCOUNTER — Other Ambulatory Visit: Payer: Self-pay | Admitting: Family Medicine

## 2017-10-16 ENCOUNTER — Encounter: Payer: Self-pay | Admitting: *Deleted

## 2017-10-16 ENCOUNTER — Encounter: Payer: Self-pay | Admitting: Family Medicine

## 2017-10-16 NOTE — Progress Notes (Signed)
End of [facility] - Review  Patient plans to follow with Dr. Tammi Klippel.  Will still continue to come here for behavioral therapy as well as primary care.  Dr. Tammi Klippel will be prescribing his psychiatric medications.    Patrick Sabal, MD [place]

## 2017-10-16 NOTE — Telephone Encounter (Signed)
Rx refilled, patient due for HTN follow up with need for bloodwork sometime in the next month. Can he please be informed.

## 2017-10-16 NOTE — Telephone Encounter (Signed)
mychart message sent to patient. Ritisha Deitrick,CMA  

## 2017-10-17 MED ORDER — LAMOTRIGINE 25 MG PO TABS: 100.0000 mg | ORAL_TABLET | Freq: Every day | ORAL | 0 refills | Status: DC

## 2017-10-17 NOTE — Telephone Encounter (Signed)
I can refill a month's worth of Lamictal while he waits to be seen by Dr. Tammi Klippel.

## 2017-10-17 NOTE — Addendum Note (Signed)
Addended byMingo Amber, Marcellas Marchant H on: 10/17/2017 09:30 AM   Modules accepted: Orders

## 2017-10-17 NOTE — Telephone Encounter (Signed)
Patient called and left a Voice Mail requesting a refill of his Lamictal.  He has not been to see Dr. Tammi Klippel as of yet.  [facility] [address]  Call back number is [phone].  Will forward to Dr. Mingo Amber (who was reviewing the patient's chart), Dr. Nori Riis, and Jazmin.

## 2017-10-27 ENCOUNTER — Telehealth: Payer: Self-pay | Admitting: Psychology

## 2017-10-27 NOTE — Telephone Encounter (Signed)
[name] left a VM asking about seeing Dr. Tammi Klippel.  This was previously discussed but he no longer had the information.  Left a VM with the phone number.  He can walk-in on the days that Dr. Tammi Klippel is there.  He can call to find out what days and times those are.  I asked him to call me back with questions / concerns.

## 2017-10-31 ENCOUNTER — Other Ambulatory Visit: Payer: Self-pay | Admitting: Family Medicine

## 2017-10-31 DIAGNOSIS — D696 Thrombocytopenia, unspecified: Secondary | ICD-10-CM

## 2017-10-31 DIAGNOSIS — E1140 Type 2 diabetes mellitus with diabetic neuropathy, unspecified: Secondary | ICD-10-CM

## 2017-10-31 DIAGNOSIS — Z794 Long term (current) use of insulin: Principal | ICD-10-CM

## 2017-10-31 DIAGNOSIS — E785 Hyperlipidemia, unspecified: Secondary | ICD-10-CM

## 2017-11-06 ENCOUNTER — Other Ambulatory Visit: Payer: Self-pay | Admitting: *Deleted

## 2017-11-06 NOTE — Telephone Encounter (Signed)
Lyrica last prescribed and filled on 10/10/17 for 30 d supply, per [place] database still has 2 refills so is not yet due for refill request.

## 2017-11-09 ENCOUNTER — Other Ambulatory Visit (INDEPENDENT_AMBULATORY_CARE_PROVIDER_SITE_OTHER): Payer: Medicaid Other

## 2017-11-09 ENCOUNTER — Other Ambulatory Visit: Payer: Medicaid Other

## 2017-11-09 ENCOUNTER — Ambulatory Visit (INDEPENDENT_AMBULATORY_CARE_PROVIDER_SITE_OTHER): Payer: Medicaid Other | Admitting: Pharmacist

## 2017-11-09 DIAGNOSIS — Z794 Long term (current) use of insulin: Secondary | ICD-10-CM | POA: Diagnosis not present

## 2017-11-09 DIAGNOSIS — F901 Attention-deficit hyperactivity disorder, predominantly hyperactive type: Secondary | ICD-10-CM | POA: Diagnosis not present

## 2017-11-09 DIAGNOSIS — E1140 Type 2 diabetes mellitus with diabetic neuropathy, unspecified: Secondary | ICD-10-CM

## 2017-11-09 DIAGNOSIS — F39 Unspecified mood [affective] disorder: Secondary | ICD-10-CM | POA: Diagnosis not present

## 2017-11-09 DIAGNOSIS — E785 Hyperlipidemia, unspecified: Secondary | ICD-10-CM

## 2017-11-09 DIAGNOSIS — D696 Thrombocytopenia, unspecified: Secondary | ICD-10-CM

## 2017-11-09 LAB — POCT GLYCOSYLATED HEMOGLOBIN (HGB A1C): HEMOGLOBIN A1C: 7.6

## 2017-11-09 MED ORDER — INSULIN ASPART 100 UNIT/ML FLEXPEN: 2.0000 [IU] | PEN_INJECTOR | Freq: Three times a day (TID) | SUBCUTANEOUS | Status: DC

## 2017-11-09 NOTE — Assessment & Plan Note (Signed)
Diabetes longstanding currently uncontrolled but close to goal of a A1c = 7. Patient denies hypoglycemic events and is able to verbalize appropriate hypoglycemia management plan. Patient reports adherence with medication. Control is suboptimal due to inability to titrate up insulin due to risk of hypoglycemia.  No recent need for glucagon.  Continued basal insulin Lantus (insulin glargine) at 28 units once daily in the AM.   Increased dose of rapid insulin Fiasp (insulin aspart) sliding scale by 1 unit per shot.  Patient understands dosing will increase to 2-3 units with snacks and 9-13 with larger meals and a range of doses between based on readings and anticipated carb load. Continued Victoza (liraglutide) and metformin at home dose.

## 2017-11-09 NOTE — Patient Instructions (Addendum)
Thank you for coming into talk with [place] today!  Great job checking your blood sugar multiple times a day. Your A1C (blood sugar measurement) is close to goal.  Continue your home dose of Lantus (28 units once daily), metformin, and Victoza.    Increase Fiasp by 1 unit at meal time. Use 2-3 units with snacks especially with fruits.  Follow up with Dr. Shawna Orleans via telephone shortly to discuss your A1C. Come back in around 6 weeks to the clinic.

## 2017-11-09 NOTE — Progress Notes (Signed)
    S:     Chief Complaint  Patient presents with  . Medication Management    Diabetes    Patient arrives in good spirits and ambulating independtly.  Presents for diabetes evaluation, education, and management at the request of Dr. Shawna Orleans. Patient was referred on 05/25/2017 and is well known to the Rx clinic.  Patient was last seen by Primary Care Provider on 05/25/2017.   Patient reports adherence with medications.  Current diabetes medications include: metformin 500 mg twice daily, Victoza 1.8 mg once daily, Lantus 28 units once daily, and Fiasp (insulin aspart) 8-12 units twice daily with meals.  Current hypertension medications include: carvedilol 6.25 mg twice daily, lisinopril 20 mg once daily.   Patient denies hypoglycemic events.  Patient reported dietary habits: Eats 2 meals/day plus a small snack which is not managed with a bolus dose of insulin.   Patient reported exercise habits: minimal    O:  Physical Exam  Constitutional: He appears well-developed and well-nourished.    Review of Systems  All other systems reviewed and are negative.   Lab Results  Component Value Date   HGBA1C 7.8 05/12/2017   Home fasting CBG: 120+ mg/dL (lowest 80 mg/dL)  2 hour post-prandial/random CBG: 140-300+ mg/dL. 7 day avg: 214 mg/dL 14 day avg 201 md/dL   A/P: Diabetes longstanding currently uncontrolled but close to goal of a A1c = 7. Patient denies hypoglycemic events and is able to verbalize appropriate hypoglycemia management plan. Patient reports adherence with medication. Control is suboptimal due to inability to titrate up insulin due to risk of hypoglycemia.  No recent need for glucagon.  Continued basal insulin Lantus (insulin glargine) at 28 units once daily in the AM.   Increased dose of rapid insulin Fiasp (insulin aspart) sliding scale by 1 unit per shot.  Patient understands dosing will increase to 2-3 units with snacks and 9-13 with larger meals and a range of doses  between based on readings and anticipated carb load. Continued Victoza (liraglutide) and metformin at home dose.   Written patient instructions provided.  Total time in face to face counseling 25 minutes.  Next f/U with PCP - Dr. Shawna Orleans.  Patient seen with Hildred Alamin, PharmD Candidate.

## 2017-11-10 ENCOUNTER — Encounter: Payer: Self-pay | Admitting: Family Medicine

## 2017-11-10 LAB — BASIC METABOLIC PANEL
BUN/Creatinine Ratio: 13 (ref 9–20)
BUN: 12 mg/dL (ref 6–24)
CALCIUM: 9.9 mg/dL (ref 8.7–10.2)
CHLORIDE: 103 mmol/L (ref 96–106)
CO2: 21 mmol/L (ref 20–29)
Creatinine, Ser: 0.91 mg/dL (ref 0.76–1.27)
GFR calc Af Amer: 108 mL/min/{1.73_m2} (ref >59)
GFR calc non Af Amer: 93 mL/min/{1.73_m2} (ref >59)
GLUCOSE: 159 mg/dL — AB (ref 65–99)
Potassium: 4.3 mmol/L (ref 3.5–5.2)
Sodium: 138 mmol/L (ref 134–144)

## 2017-11-10 LAB — CBC
Hematocrit: 41.2 % (ref 37.5–51.0)
Hemoglobin: 14.2 g/dL (ref 13.0–17.7)
MCH: 29.9 pg (ref 26.6–33.0)
MCHC: 34.5 g/dL (ref 31.5–35.7)
MCV: 87 fL (ref 79–97)
PLATELETS: 94 10*3/uL — AB (ref 150–379)
RBC: 4.75 x10E6/uL (ref 4.14–5.80)
RDW: 14 % (ref 12.3–15.4)
WBC: 4.5 10*3/uL (ref 3.4–10.8)

## 2017-11-10 LAB — LIPID PANEL
CHOLESTEROL TOTAL: 152 mg/dL (ref 100–199)
Chol/HDL Ratio: 3.6 ratio (ref 0.0–5.0)
HDL: 42 mg/dL (ref >39)
LDL Calculated: 64 mg/dL (ref 0–99)
Triglycerides: 230 mg/dL — ABNORMAL HIGH (ref 0–149)
VLDL Cholesterol Cal: 46 mg/dL — ABNORMAL HIGH (ref 5–40)

## 2017-11-10 NOTE — Progress Notes (Signed)
Patient ID: Patrick Elliott, male   DOB: Jul 01, 1961, 57 y.o.   MRN: [id number] Reviewed: Agree with Dr. Graylin Shiver documentation and management.

## 2017-11-11 ENCOUNTER — Other Ambulatory Visit: Payer: Self-pay | Admitting: Family Medicine

## 2017-11-14 ENCOUNTER — Encounter: Payer: Self-pay | Admitting: Family Medicine

## 2017-11-14 MED ORDER — PREGABALIN 100 MG PO CAPS: 100.0000 mg | ORAL_CAPSULE | Freq: Three times a day (TID) | ORAL | 2 refills | Status: DC

## 2017-11-14 NOTE — Telephone Encounter (Signed)
Will resent to provider to please send in electronically.  Jazmin Hartsell,CMA

## 2017-11-14 NOTE — Telephone Encounter (Signed)
Spoke with Thurmond Butts at the pharmacy and he stated that it has been over 6 months since that script was issued that they have on file for him so it is no longer valid.  Please send in a new script for this patient. Chamara Dyck,CMA

## 2017-11-14 NOTE — Telephone Encounter (Signed)
[place] PMP reviewed today, no red flags but database says that lyrica was last refilled on 10/10/17 and still has 2 refills on it. Can we please call pharmacy to confirm. Patient sent in [place] message stating he was out.

## 2017-11-14 NOTE — Addendum Note (Signed)
Addended by: Valerie Roys on: 11/14/2017 02:54 PM   Modules accepted: Orders

## 2017-11-21 ENCOUNTER — Other Ambulatory Visit: Payer: Self-pay | Admitting: Family Medicine

## 2017-11-22 ENCOUNTER — Ambulatory Visit: Payer: Medicaid Other | Admitting: Psychology

## 2017-11-24 ENCOUNTER — Other Ambulatory Visit: Payer: Self-pay | Admitting: Family Medicine

## 2017-11-29 ENCOUNTER — Ambulatory Visit (INDEPENDENT_AMBULATORY_CARE_PROVIDER_SITE_OTHER): Payer: Medicaid Other | Admitting: Psychology

## 2017-11-29 DIAGNOSIS — F3175 Bipolar disorder, in partial remission, most recent episode depressed: Secondary | ICD-10-CM | POA: Diagnosis not present

## 2017-11-29 NOTE — Progress Notes (Signed)
Reason for follow-up:  Follow-up for mood.  Patrick Elliott was a patient of the [facility].  It is no longer available.  He meets with Dr. Tammi Klippel for med management and wanted an appointment with me as well.    Issues discussed:  Overall his mood remains stable.  He has a "mantra" he uses when in a dark spot that reminds him that it will eventually shift.  He is active with his pets, his parents, and his significant other.    He continues getting "stuck" mentally - sometimes for long periods of time.  He can occasionally push through but sometimes not.  It bothers him because he has something he wants to do but feels blocked.

## 2017-11-29 NOTE — Patient Instructions (Signed)
It was so good to see you today.  You look well.  I scheduled you an appointment for 7/8 at 11:30.  The book that might be interesting is called A Man Called Ove.

## 2017-11-29 NOTE — Assessment & Plan Note (Signed)
Medications managed by Dr. Tammi Klippel at [facility].  Report of mood today is within normal limits.  His affect is quite bright.  Spring time weather is a boost for him I think.  He wondered about continuing sessions.  I suggested if he had something to work on, meeting with me would make good sense.  His response:  Meeting with me and Dr. Tammi Klippel helped keep him stable.  It brings a sense of comfort and a reminder of things he can do to maintain some emotional stability.  He would like to continue meeting with me for this reason.  I think this is reasonable especially given his desire to meet about every three months.  See patient instructions for further plan.

## 2017-12-19 ENCOUNTER — Other Ambulatory Visit: Payer: Self-pay | Admitting: Family Medicine

## 2017-12-26 ENCOUNTER — Other Ambulatory Visit: Payer: Self-pay | Admitting: Family Medicine

## 2017-12-26 DIAGNOSIS — Z794 Long term (current) use of insulin: Principal | ICD-10-CM

## 2017-12-26 DIAGNOSIS — E1140 Type 2 diabetes mellitus with diabetic neuropathy, unspecified: Secondary | ICD-10-CM

## 2018-01-12 ENCOUNTER — Other Ambulatory Visit: Payer: Self-pay | Admitting: Family Medicine

## 2018-01-21 ENCOUNTER — Other Ambulatory Visit: Payer: Self-pay | Admitting: Family Medicine

## 2018-02-05 ENCOUNTER — Telehealth: Payer: Self-pay | Admitting: Family Medicine

## 2018-02-05 DIAGNOSIS — F3175 Bipolar disorder, in partial remission, most recent episode depressed: Secondary | ICD-10-CM

## 2018-02-05 NOTE — Telephone Encounter (Signed)
Attempted to call patient back to discuss regarding cardiologist. No response so left message. Patient also can make appt as I have not seen him for awhile. Referral placed to help with pre-auth process.

## 2018-02-05 NOTE — Telephone Encounter (Signed)
Patient is wondering if he needs to see a cardiologist about his heart?.  Please let him know, and how to proceed. Thanks  Also, he needs a pre-authorization through his medicaid to see Dr. Wilma Flavin.  He has appt on 02/08/18.

## 2018-02-05 NOTE — Telephone Encounter (Signed)
Will forward to MD to advise. Nahum Sherrer,CMA  

## 2018-02-06 ENCOUNTER — Other Ambulatory Visit: Payer: Self-pay | Admitting: Family Medicine

## 2018-02-06 NOTE — Telephone Encounter (Signed)
Patient informed and appt made. Fleeger, Salome Spotted, CMA

## 2018-02-08 DIAGNOSIS — F5101 Primary insomnia: Secondary | ICD-10-CM | POA: Insufficient documentation

## 2018-02-15 ENCOUNTER — Ambulatory Visit (INDEPENDENT_AMBULATORY_CARE_PROVIDER_SITE_OTHER): Payer: Medicaid Other | Admitting: Pharmacist

## 2018-02-15 ENCOUNTER — Encounter: Payer: Self-pay | Admitting: Pharmacist

## 2018-02-15 DIAGNOSIS — Z794 Long term (current) use of insulin: Secondary | ICD-10-CM | POA: Diagnosis not present

## 2018-02-15 DIAGNOSIS — E1140 Type 2 diabetes mellitus with diabetic neuropathy, unspecified: Secondary | ICD-10-CM | POA: Diagnosis not present

## 2018-02-15 LAB — POCT GLYCOSYLATED HEMOGLOBIN (HGB A1C): HbA1c, POC (controlled diabetic range): 7.8 % — AB (ref 0.0–7.0)

## 2018-02-15 MED ORDER — INSULIN ASPART 100 UNIT/ML FLEXPEN: 2.0000 [IU] | PEN_INJECTOR | Freq: Three times a day (TID) | SUBCUTANEOUS | Status: DC

## 2018-02-15 NOTE — Assessment & Plan Note (Signed)
Diabetes longstanding of 12 years duration and currently under good control based on A1c of 7.8 today in office. Patient is able to verbalize appropriate hypoglycemia management plan including use of glucagon which he has used numerous times in the past. Patient is not adherent with medication. Control is challenging on occational days of high readings. We discussed plan for correct of insulin with short acting FIASP insulin.  -Continued basal insulin Lantus (insulin glargine) at 28 units once daily.  -Continued  rapid insulin Fiasp (insulin aspart) at same sliding scale with meals and possible use of additive sliding scale with max limit of 12 units combined at 4 hours. .  -Extensively discussed pathophysiology of DM, recommended lifestyle interventions, dietary effects on glycemic control -Counseled on s/sx of and management of hypoglycemia -Next A1C anticipated 3-6 months.

## 2018-02-15 NOTE — Patient Instructions (Addendum)
Great to see you again!    Keep working hard on your blood sugar control.  Your A1c was 7.8 - Still in good range.   If you decide to take an additional "catch-up" dose of Fiasp please wait at least 4 hours and take only 1/2 of the sliding scale dose you typically use.    Plan to follow up in Rx Clinic in 6-8 weeks.
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
## 2018-02-16 NOTE — Progress Notes (Signed)
Patient ID: Patrick Elliott, male   DOB: 07/14/1961, 57 y.o.   MRN: [id number] Reviewed: Agree with Patrick Elliott's documentation and management. 

## 2018-02-18 ENCOUNTER — Other Ambulatory Visit: Payer: Self-pay | Admitting: Family Medicine

## 2018-02-20 ENCOUNTER — Other Ambulatory Visit: Payer: Self-pay

## 2018-02-20 ENCOUNTER — Encounter: Payer: Self-pay | Admitting: Family Medicine

## 2018-02-20 ENCOUNTER — Ambulatory Visit (INDEPENDENT_AMBULATORY_CARE_PROVIDER_SITE_OTHER): Payer: Medicaid Other | Admitting: Family Medicine

## 2018-02-20 VITALS — BP 128/72 | HR 97 | Temp 98.2°F | Wt 190.0 lb

## 2018-02-20 DIAGNOSIS — I10 Essential (primary) hypertension: Secondary | ICD-10-CM | POA: Diagnosis not present

## 2018-02-20 DIAGNOSIS — E1140 Type 2 diabetes mellitus with diabetic neuropathy, unspecified: Secondary | ICD-10-CM

## 2018-02-20 DIAGNOSIS — I5032 Chronic diastolic (congestive) heart failure: Secondary | ICD-10-CM

## 2018-02-20 DIAGNOSIS — Z794 Long term (current) use of insulin: Secondary | ICD-10-CM | POA: Diagnosis not present

## 2018-02-20 DIAGNOSIS — Z1211 Encounter for screening for malignant neoplasm of colon: Secondary | ICD-10-CM

## 2018-02-20 MED ORDER — PREGABALIN 100 MG PO CAPS: 100.0000 mg | ORAL_CAPSULE | Freq: Three times a day (TID) | ORAL | 2 refills | Status: DC

## 2018-02-20 MED ORDER — METFORMIN HCL 500 MG PO TABS: 500.0000 mg | ORAL_TABLET | Freq: Two times a day (BID) | ORAL | 2 refills | Status: DC

## 2018-02-20 NOTE — Patient Instructions (Addendum)
It was good to see you today!   You seem to be doing well overall. If your shortness of breath on exertion becomes more consistent or gets worse please let me know. Right now everything looks good.  Please stop by the lab to pick up your colon cancer screening cards.  Please bring all of your medications with you to each visit.   Sign up for My Chart to have easy access to your labs results, and communication with your primary care physician.  Feel free to call with any questions or concerns at any time, at [phone].   Take care,  Dr. Bufford Lope, [place]

## 2018-02-20 NOTE — Progress Notes (Signed)
    Subjective:  Patrick Elliott is a 57 y.o. male who presents to the [facility] today for follow up on HTN and CHF  HPI:   Hypertension Taking Coreg 6 to 5 mg twice daily and lisinopril 20 mg twice daily.  Reports good compliance.  Is tolerating well. No chest pain, shortness of breath, lightheadedness, dizziness, lower extremity edema  HFpEF He used to see cardiology Dr. Acie Fredrickson but has been several years.  He is wondering if he should go back.  He is not having any acute complaints with this. Denies orthopnea, PND, wheezing, shortness of breath, lower extremity edema  ROS: Per HPI   Objective:  Physical Exam: BP 128/72   Pulse 97   Temp 98.2 F (36.8 C) (Oral)   Wt 190 lb (86.2 kg)   SpO2 99%   BMI 29.76 kg/m   Gen: NAD, resting comfortably CV: RRR with no murmurs appreciated Pulm: NWOB, CTAB with no crackles, wheezes, or rhonchi GI: Normal bowel sounds present. Soft, Nontender, Nondistended. MSK: no edema, cyanosis, or clubbing noted Skin: warm, dry Neuro: grossly normal, moves all extremities Psych: Normal affect and thought content   Assessment/Plan:  HYPERTENSION, BENIGN ESSENTIAL Controlled, stable.  Patient is at goal on current medication regimen of Coreg 6.25 mg twice daily and lisinopril 20 mg twice daily.  Has had recent blood work.  Continue current management  Chronic diastolic congestive heart failure Controlled and stable.  No indication to refer back to cardiology at this point.  However patient voiced good understanding that should he have any chronic worsening that referral would be reasonable at that point.  Continue current management  Health maintenance -Diabetic eye exam referral placed today -Stool cards given for colon cancer screening  Bufford Lope, DO PGY-3, [facility] 02/20/2018 4:09 PM

## 2018-02-21 NOTE — Assessment & Plan Note (Signed)
Controlled and stable.  No indication to refer back to cardiology at this point.  However patient voiced good understanding that should he have any chronic worsening that referral would be reasonable at that point.  Continue current management

## 2018-02-21 NOTE — Assessment & Plan Note (Signed)
Controlled, stable.  Patient is at goal on current medication regimen of Coreg 6.25 mg twice daily and lisinopril 20 mg twice daily.  Has had recent blood work.  Continue current management

## 2018-02-26 ENCOUNTER — Ambulatory Visit (INDEPENDENT_AMBULATORY_CARE_PROVIDER_SITE_OTHER): Payer: Medicaid Other | Admitting: Psychology

## 2018-02-26 DIAGNOSIS — Z1211 Encounter for screening for malignant neoplasm of colon: Secondary | ICD-10-CM | POA: Diagnosis not present

## 2018-02-26 DIAGNOSIS — F3175 Bipolar disorder, in partial remission, most recent episode depressed: Secondary | ICD-10-CM

## 2018-02-26 NOTE — Assessment & Plan Note (Signed)
Report of mood is stable.  He has good days and bad days depending on the interplay of his physical and mental health.  No evidence of SI / HI.  His thoughts are clear and goal directed.  His attention span and focus appears within normal limits in conversation.  He has a range of emotion that is appropriate to the situation.  The interpersonal dynamics seem to be good as well.  Overall, doing well.  Will continue to follow as he identified support every three months or so as being important to his health and well-being.  Will communicate with Dr. Tammi Klippel as needed for shared care.    See patient instructions for further plan.

## 2018-02-26 NOTE — Patient Instructions (Signed)
Please schedule a follow-up for 10/7 at 11:00.   It was great to see you today Patrick Elliott.  Tell [name] I said hi.

## 2018-02-26 NOTE — Progress Notes (Signed)
Reason for follow-up:  Patrick Elliott presents for follow-up.  He follows up about every 3 months.   Issues discussed:  Seeing Dr. Tammi Klippel for his mental health medications for the time being.  Last saw him 02/08/18.  Overall, reports he is stable.  Continues with mood swings that are typical for him and managed as best he can.  Focus of our visit was on his past substance use - what contributed to it and how he managed it.  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

## 2018-02-28 ENCOUNTER — Encounter: Payer: Self-pay | Admitting: Family Medicine

## 2018-02-28 LAB — FECAL OCCULT BLOOD, IMMUNOCHEMICAL: Fecal Occult Bld: NEGATIVE

## 2018-03-12 ENCOUNTER — Other Ambulatory Visit: Payer: Self-pay | Admitting: Family Medicine

## 2018-03-17 ENCOUNTER — Other Ambulatory Visit: Payer: Self-pay | Admitting: Family Medicine

## 2018-03-20 ENCOUNTER — Other Ambulatory Visit: Payer: Self-pay | Admitting: Family Medicine

## 2018-03-21 ENCOUNTER — Other Ambulatory Visit: Payer: Self-pay | Admitting: Family Medicine

## 2018-04-04 DIAGNOSIS — G518 Other disorders of facial nerve: Secondary | ICD-10-CM | POA: Diagnosis not present

## 2018-04-04 DIAGNOSIS — G43719 Chronic migraine without aura, intractable, without status migrainosus: Secondary | ICD-10-CM | POA: Diagnosis not present

## 2018-04-04 DIAGNOSIS — M542 Cervicalgia: Secondary | ICD-10-CM | POA: Diagnosis not present

## 2018-04-04 DIAGNOSIS — M791 Myalgia, unspecified site: Secondary | ICD-10-CM | POA: Diagnosis not present

## 2018-04-06 ENCOUNTER — Other Ambulatory Visit: Payer: Self-pay | Admitting: Family Medicine

## 2018-04-06 DIAGNOSIS — E785 Hyperlipidemia, unspecified: Secondary | ICD-10-CM

## 2018-04-13 ENCOUNTER — Other Ambulatory Visit: Payer: Self-pay | Admitting: Family Medicine

## 2018-04-18 ENCOUNTER — Other Ambulatory Visit: Payer: Self-pay | Admitting: Family Medicine

## 2018-04-19 DIAGNOSIS — F909 Attention-deficit hyperactivity disorder, unspecified type: Secondary | ICD-10-CM | POA: Diagnosis not present

## 2018-05-08 ENCOUNTER — Other Ambulatory Visit: Payer: Self-pay | Admitting: Family Medicine

## 2018-05-16 DIAGNOSIS — M791 Myalgia, unspecified site: Secondary | ICD-10-CM | POA: Diagnosis not present

## 2018-05-16 DIAGNOSIS — G518 Other disorders of facial nerve: Secondary | ICD-10-CM | POA: Diagnosis not present

## 2018-05-16 DIAGNOSIS — M542 Cervicalgia: Secondary | ICD-10-CM | POA: Diagnosis not present

## 2018-05-16 DIAGNOSIS — R51 Headache: Secondary | ICD-10-CM | POA: Diagnosis not present

## 2018-05-16 DIAGNOSIS — G43719 Chronic migraine without aura, intractable, without status migrainosus: Secondary | ICD-10-CM | POA: Diagnosis not present

## 2018-05-25 ENCOUNTER — Other Ambulatory Visit: Payer: Self-pay | Admitting: Family Medicine

## 2018-05-28 ENCOUNTER — Ambulatory Visit: Payer: Self-pay | Admitting: Psychology

## 2018-05-28 ENCOUNTER — Ambulatory Visit (INDEPENDENT_AMBULATORY_CARE_PROVIDER_SITE_OTHER): Payer: Medicaid Other | Admitting: Psychology

## 2018-05-28 DIAGNOSIS — F3175 Bipolar disorder, in partial remission, most recent episode depressed: Secondary | ICD-10-CM | POA: Diagnosis not present

## 2018-05-28 NOTE — Progress Notes (Signed)
Reason for follow-up:  Patrick Elliott presents for follow-up. His last appointment was three months ago.   Issues discussed:  He last saw Dr. Tammi Klippel in late August. He got word that that office will be closing and Dr. Tammi Klippel will no longer be practicing. Per Dr. Tammi Klippel, he will see him one more time and get prescriptions for six months or so. We spent some time today discussing next best steps. He is concerned about changes in his medication with a new provider. He is open to scheduling with a psychiatrist but would prefer to have his medication managed at the [facility].  Overall, he reports he is doing well. 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

## 2018-05-28 NOTE — Patient Instructions (Addendum)
It was good to see you today. I scheduled to see you back for December 16th at 11:30.  Call sooner if you need to.  Dr. Casimiro Needle might be a reasonable match at [address]. Phone number: [phone].  I will check with our physician faculty to see if one of them might be willing to take over your care. I will let you know about this.

## 2018-05-28 NOTE — Assessment & Plan Note (Signed)
Affect is within normal limits. Continues to have mood swings consistent with prior reports. Believes that the medications are helpful and allow him to maintain a reasonable amount of function.  We contacted a psychiatric office during session today and got him scheduled. Because he prefers to stay in the [facility], I will ask our physician faculty if it is something one of them would consider managing and will let him know.  Will follow in three months or as needed.

## 2018-05-31 ENCOUNTER — Ambulatory Visit: Payer: Medicaid Other | Admitting: Pharmacist

## 2018-05-31 ENCOUNTER — Encounter: Payer: Self-pay | Admitting: Pharmacist

## 2018-05-31 ENCOUNTER — Ambulatory Visit (INDEPENDENT_AMBULATORY_CARE_PROVIDER_SITE_OTHER): Payer: Medicaid Other | Admitting: *Deleted

## 2018-05-31 VITALS — BP 148/72 | HR 89 | Ht 67.0 in | Wt 189.2 lb

## 2018-05-31 DIAGNOSIS — F319 Bipolar disorder, unspecified: Secondary | ICD-10-CM | POA: Diagnosis not present

## 2018-05-31 DIAGNOSIS — Z23 Encounter for immunization: Secondary | ICD-10-CM

## 2018-05-31 DIAGNOSIS — F909 Attention-deficit hyperactivity disorder, unspecified type: Secondary | ICD-10-CM | POA: Diagnosis not present

## 2018-05-31 DIAGNOSIS — F411 Generalized anxiety disorder: Secondary | ICD-10-CM | POA: Diagnosis not present

## 2018-05-31 DIAGNOSIS — E1140 Type 2 diabetes mellitus with diabetic neuropathy, unspecified: Secondary | ICD-10-CM | POA: Diagnosis not present

## 2018-05-31 DIAGNOSIS — Z794 Long term (current) use of insulin: Secondary | ICD-10-CM | POA: Diagnosis not present

## 2018-05-31 DIAGNOSIS — I10 Essential (primary) hypertension: Secondary | ICD-10-CM | POA: Diagnosis not present

## 2018-05-31 DIAGNOSIS — F5101 Primary insomnia: Secondary | ICD-10-CM | POA: Diagnosis not present

## 2018-05-31 MED ORDER — EMPAGLIFLOZIN 10 MG PO TABS: 10.0000 mg | ORAL_TABLET | Freq: Every day | ORAL | 2 refills | Status: DC

## 2018-05-31 NOTE — Progress Notes (Signed)
    S:     Chief Complaint  Patient presents with  . Medication Management    Diabetes    Patient arrives in good spirits, ambulating without assistance  Presents for diabetes evaluation, education, and management follow up as long-term chronic collaborative care with PCP Dr. Shawna Orleans. Last saw PCP on 02/20/2018. Last saw Pharmacy Clinic on 02/15/2018- at that time, Fiasp dosing was further adjusted.   Patient notes that overall, he hasn't been having as many low blood sugars as previously, but he is experiencing more highs. He notes more fatigue and overall feeling poorly with more elevated BG results.   Patient reports Diabetes was diagnosed 2005 following life threatening event.   Insurance coverage/medication affordability: [place] Medicaid  Patient reports adherence with medications.  Current diabetes medications include: metformin 2000 mg daily, Victoza 1.8 mg daily, Lantus 32 units daily, Fiasp 2-12 units with meals (typically ~12 units daily) Current hypertension medications include: lisinopril 20 mg, carvedilol 6.25 mg BID   Patient reports decreased frequency in hypoglycemic events. He notes an increase in neuropathy    O:  Physical Exam  Constitutional: He appears well-developed and well-nourished.  Vitals reviewed.    Review of Systems  All other systems reviewed and are negative.    Lab Results  Component Value Date   HGBA1C 7.8 (A) 02/15/2018   Vitals:   05/31/18 1528  BP: (!) 148/72  Pulse: 89  SpO2: 99%    Lipid Panel     Component Value Date/Time   CHOL 152 11/09/2017 1621   TRIG 230 (H) 11/09/2017 1621   HDL 42 11/09/2017 1621   CHOLHDL 3.6 11/09/2017 1621   CHOLHDL 3.9 11/02/2016 1551   VLDL 23 11/02/2016 1551   LDLCALC 64 11/09/2017 1621   LDLDIRECT 79 05/29/2009 2224    Home BG:  - 7 day average 219  - 14 day average 229 In the past week, ranged from 93 to 402  Clinical ASCVD: No  ASCVD risk factors : age 59-75, persistently elevated TG >/=  175, ABI < 0.9  10 year ASCVD risk: ~18%  A/P: Diabetes longstanding since 2005 currently uncontrolled d/t significant blood glucose fluctuation. Patient is able to verbalize appropriate hypoglycemia management plan. Patient is adherent with medication. Control is difficult due to long hx of disease, wide glucose variability. Appropriate to add an insulin-sparing agent to reduce fluctuations - Initiate Jardiance (empagliflozin) 10 mg daily. Counseled on sick day rules.  - Continue Lantus 32 units, Fiasp per sliding scale, metformin 2000 mg daily, and Victoza 1.8 mg daily -Next A1C anticipated at next visit.   ASCVD risk - primary prevention in patient with DM. Last LDL is controlled. ASCVD risk score is not >20%  - moderate intensity statin indicated.  - Continue simvastatin 20 mg daily  Hypertension longstanding currently uncontrolled.  BP goal <130/80 mmHg d/t ASCVD risk >15%. Patient is adherent with medication. Diuretic effect of Jardiance may positively impact BP - Continued lisinopril 20 mg and carvedilol 6.25 mg BID. Continue to monitor.   Written patient instructions provided.  Total time in face to face counseling 30 minutes.   Follow up Pharmacist Clinic Visit in 4 weeks.   Patient seen with Andee Poles, PharmD Candidate and Catie Darnelle Maffucci, PharmD,  PGY2 Pharmacy Resident.

## 2018-05-31 NOTE — Patient Instructions (Addendum)
It was great to see you today!   We are going to add a new medication today - Jardiance (empagliflozin) at 10 mg daily. Make sure you stay well hydrated with this medication.  Continue your current Lantus 32 units daily and Fiasp as you are doing it. Continue metformin 1000 mg twice daily and Victoza 1.8 mg daily.   Dr. Casimiro Needle or [place].  Schedule follow up with [place] in 4 weeks.

## 2018-06-02 NOTE — Progress Notes (Signed)
Patient ID: Patrick Elliott, male   DOB: 09/11/1960, 57 y.o.   MRN: [id number] Reviewed: Agree with Dr. Koval's documentation and management. 

## 2018-06-02 NOTE — Assessment & Plan Note (Signed)
Diabetes longstanding since 2005 currently uncontrolled d/t significant blood glucose fluctuation. Patient is able to verbalize appropriate hypoglycemia management plan. Patient is adherent with medication. Control is difficult due to long hx of disease, wide glucose variability. Appropriate to add an insulin-sparing agent to reduce fluctuations - Initiate Jardiance (empagliflozin) 10 mg daily. Counseled on sick day rules.  - Continue Lantus 32 units, Fiasp per sliding scale, metformin 2000 mg daily, and Victoza 1.8 mg daily -Next A1C anticipated at next visit.

## 2018-06-02 NOTE — Assessment & Plan Note (Signed)
Hypertension longstanding currently uncontrolled.  BP goal <130/80 mmHg d/t ASCVD risk >15%. Patient is adherent with medication. Diuretic effect of Jardiance may positively impact BP - Continued lisinopril 20 mg and carvedilol 6.25 mg BID. Continue to monitor.

## 2018-06-06 ENCOUNTER — Other Ambulatory Visit: Payer: Self-pay | Admitting: Family Medicine

## 2018-06-06 DIAGNOSIS — Z794 Long term (current) use of insulin: Principal | ICD-10-CM

## 2018-06-06 DIAGNOSIS — E118 Type 2 diabetes mellitus with unspecified complications: Secondary | ICD-10-CM

## 2018-06-08 ENCOUNTER — Other Ambulatory Visit: Payer: Self-pay | Admitting: *Deleted

## 2018-06-11 MED ORDER — PREGABALIN 100 MG PO CAPS: 100.0000 mg | ORAL_CAPSULE | Freq: Three times a day (TID) | ORAL | 3 refills | Status: DC

## 2018-06-11 NOTE — Telephone Encounter (Signed)
[place] pmp reviewed. No red flags.

## 2018-07-02 ENCOUNTER — Other Ambulatory Visit: Payer: Self-pay | Admitting: Family Medicine

## 2018-07-03 DIAGNOSIS — H40013 Open angle with borderline findings, low risk, bilateral: Secondary | ICD-10-CM | POA: Diagnosis not present

## 2018-07-03 DIAGNOSIS — E119 Type 2 diabetes mellitus without complications: Secondary | ICD-10-CM | POA: Diagnosis not present

## 2018-07-03 DIAGNOSIS — H04123 Dry eye syndrome of bilateral lacrimal glands: Secondary | ICD-10-CM | POA: Diagnosis not present

## 2018-07-03 LAB — HM DIABETES EYE EXAM

## 2018-07-04 DIAGNOSIS — G43719 Chronic migraine without aura, intractable, without status migrainosus: Secondary | ICD-10-CM | POA: Diagnosis not present

## 2018-07-05 ENCOUNTER — Ambulatory Visit: Payer: Self-pay | Admitting: Pharmacist

## 2018-07-16 ENCOUNTER — Other Ambulatory Visit: Payer: Self-pay | Admitting: Family Medicine

## 2018-07-18 ENCOUNTER — Other Ambulatory Visit: Payer: Self-pay | Admitting: Family Medicine

## 2018-07-18 DIAGNOSIS — E1140 Type 2 diabetes mellitus with diabetic neuropathy, unspecified: Secondary | ICD-10-CM

## 2018-07-18 DIAGNOSIS — Z794 Long term (current) use of insulin: Principal | ICD-10-CM

## 2018-07-23 DIAGNOSIS — G518 Other disorders of facial nerve: Secondary | ICD-10-CM | POA: Diagnosis not present

## 2018-07-23 DIAGNOSIS — M542 Cervicalgia: Secondary | ICD-10-CM | POA: Diagnosis not present

## 2018-07-23 DIAGNOSIS — R51 Headache: Secondary | ICD-10-CM | POA: Diagnosis not present

## 2018-07-23 DIAGNOSIS — G43719 Chronic migraine without aura, intractable, without status migrainosus: Secondary | ICD-10-CM | POA: Diagnosis not present

## 2018-07-23 DIAGNOSIS — M791 Myalgia, unspecified site: Secondary | ICD-10-CM | POA: Diagnosis not present

## 2018-07-26 ENCOUNTER — Ambulatory Visit: Payer: Medicaid Other | Admitting: Pharmacist

## 2018-07-26 ENCOUNTER — Encounter: Payer: Self-pay | Admitting: Pharmacist

## 2018-07-26 VITALS — Wt 188.0 lb

## 2018-07-26 DIAGNOSIS — E1140 Type 2 diabetes mellitus with diabetic neuropathy, unspecified: Secondary | ICD-10-CM | POA: Diagnosis not present

## 2018-07-26 DIAGNOSIS — Z794 Long term (current) use of insulin: Secondary | ICD-10-CM | POA: Diagnosis not present

## 2018-07-26 MED ORDER — GLUCAGON 3 MG/DOSE NA POWD: 3.0000 mg | NASAL | 3 refills | Status: DC | PRN

## 2018-07-26 MED ORDER — EMPAGLIFLOZIN 25 MG PO TABS: 25.0000 mg | ORAL_TABLET | Freq: Every day | ORAL | 2 refills | Status: DC

## 2018-07-26 NOTE — Assessment & Plan Note (Signed)
Diabetes longstanding since 2005 currently uncontrolled d/t significant BG fluctuation. He notes that he occasionally has a hard time with glucagon administration when he is experiencing hypoglycemia, especially since he lives alone. Appropriate to investigate coverage of new hypoglycemic therapy, as well as maximize non-insulin therapy to help reduce episodes of hyperglycemia  - Increase Jardiance to 25 mg daily.  - Prescribe Basqimi nasal glucagon 3 mg into the nostril PRN - Continue Lantus 24 units daily, Fiasp 6-8 units PRN, Victoza 1.8 mg daily, metformin 500 mg BID - Per chart review, it does not appear that patient has tried metformin XR before. Moving forward, could consider trying metformin XR 750 mg BID and evaluate GI tolerability  - Plan to check A1c, BMP at next appointment   Neuropathy, uncontrolled. Currently treated with pregabalin 100 mg TID and nortriptyline 50 mg QHS. Appropriate to trial dose increase of nortriptyline - Increase nortriptyline to 75 mg daily as a trial. Patient counseled to reduce back to 50 mg daily if excessive anticholinergic effects are noted (drowsiness, dizziness, blurred vision, dry mouth, dry eyes, etc).  - Continue pregabalin 100 mg TID

## 2018-07-26 NOTE — Progress Notes (Signed)
S:     Chief Complaint  Patient presents with  . Medication Management    diabetes    Patient arrives in moderate spirits, ambulating without assistance.  Presents for diabetes evaluation, education, and management at the follow up as long-term chronic collaborative care with PCP Dr. Shawna Orleans. Last saw PCP on 02/20/2018. Last saw Pharmacy Clinic on 05/31/2018 - at that time, Jardiance 10 mg daily was initiated.   Today, he notes that he has tolerated the Jardiance well - denies s/sx genitourinary infections. Does note that he is thirstier, and has been drinking more water lately. Upon BG review, he has been having more episodes of hyperglycemia, sometimes up to 300-400s. He notes that he has been stressed lately with a recent rapid decline in his father's health, and unfortunately missed his first appointment with his new psychiatrist, Dr. Casimiro Needle, due to his father's hospitalization.  He does note that he is having worse neuropathy lately in his hands, and feels like he cannot hold onto things as well without dropping them. He denies any morning grogginess due to nortriptyline therapy.   Patient reports adherence with medications.  Current diabetes medications include: Lantus 24 units daily, Fiasp 6-8 units with meals or as correctional, Jardiance 10 mg daily, Victoza 1.8 mg daily, metformin 500 mg BID (maximum tolerated dose d/t GI upset) Current hypertension medications include: lisinopril 20 mg daily  Patient reports hypoglycemic events, but reports sugars around 100.   O:  Physical Exam  Constitutional: He appears well-developed and well-nourished.   Review of Systems  All other systems reviewed and are negative.   Lab Results  Component Value Date   HGBA1C 7.8 (A) 02/15/2018   Lipid Panel     Component Value Date/Time   CHOL 152 11/09/2017 1621   TRIG 230 (H) 11/09/2017 1621   HDL 42 11/09/2017 1621   CHOLHDL 3.6 11/09/2017 1621   CHOLHDL 3.9 11/02/2016 1551   VLDL 23  11/02/2016 1551   LDLCALC 64 11/09/2017 1621   LDLDIRECT 79 05/29/2009 2224    7 day average 235 - Some readings in the 300s-400s;    Clinical ASCVD: No  The 10-year ASCVD risk score Mikey Bussing DC Jr., et al., 2013) is: 17.5%   Values used to calculate the score:     Age: 57 years     Sex: Male     Is Non-Hispanic African American: No     Diabetic: Yes     Tobacco smoker: No     Systolic Blood Pressure: 850 mmHg     Is BP treated: Yes     HDL Cholesterol: 42 mg/dL     Total Cholesterol: 152 mg/dL    A/P: Diabetes longstanding since 2005 currently uncontrolled d/t significant BG fluctuation. He notes that he occasionally has a hard time with glucagon administration when he is experiencing hypoglycemia, especially since he lives alone. Appropriate to investigate coverage of new hypoglycemic therapy, as well as maximize non-insulin therapy to help reduce episodes of hyperglycemia  - Increase Jardiance to 25 mg daily.  - Prescribe Basqimi nasal glucagon 3 mg into the nostril PRN - Continue Lantus 24 units daily, Fiasp 6-8 units PRN, Victoza 1.8 mg daily, metformin 500 mg BID - Per chart review, it does not appear that patient has tried metformin XR before. Moving forward, could consider trying metformin XR 750 mg BID and evaluate GI tolerability  - Plan to check A1c, BMP at next appointment   Neuropathy, uncontrolled. Currently treated with pregabalin  100 mg TID and nortriptyline 50 mg QHS. Appropriate to trial dose increase of nortriptyline - Increase nortriptyline to 75 mg daily as a trial. Patient counseled to reduce back to 50 mg daily if excessive anticholinergic effects are noted (drowsiness, dizziness, blurred vision, dry mouth, dry eyes, etc).  - Continue pregabalin 100 mg TID   Written patient instructions provided.  Total time in face to face counseling 30 minutes.   Follow up Pharmacist Clinic Visit in 1 month.   Patient seen with Catie Darnelle Maffucci, PharmD, PGY2 Pharmacy Resident

## 2018-07-26 NOTE — Patient Instructions (Addendum)
It was great to see you today!  We are going to make 1 change:  1) Increase Jardiance to 25 mg daily. You can take 2 of the 10 mg tablets that you have at home until you finish your supply.   Continue your current insulin doses. If you start to see lower blood sugars, please call the clinic.   We are sending a prescription for Basqimi (nasal glucagon) to the pharmacy. See if this is covered by Medicaid.   Try increasing the nortriptyline to 3 tablets (75 mg) daily. Give [place] a call if you need a new prescription sent in.   If you have any questions or concerns, please give [place] a call. Schedule follow up with [place] in January. We'll get some lab work at that point.

## 2018-07-27 ENCOUNTER — Other Ambulatory Visit: Payer: Self-pay

## 2018-07-27 DIAGNOSIS — E1140 Type 2 diabetes mellitus with diabetic neuropathy, unspecified: Secondary | ICD-10-CM

## 2018-07-27 DIAGNOSIS — Z794 Long term (current) use of insulin: Principal | ICD-10-CM

## 2018-07-27 MED ORDER — LISINOPRIL 20 MG PO TABS: 20.0000 mg | ORAL_TABLET | Freq: Every day | ORAL | 2 refills | Status: DC

## 2018-07-27 MED ORDER — GLUCOSE BLOOD VI STRP: ORAL_STRIP | 12 refills | Status: DC

## 2018-07-27 MED ORDER — OMEPRAZOLE 20 MG PO CPDR: DELAYED_RELEASE_CAPSULE | ORAL | 0 refills | Status: DC

## 2018-07-27 MED ORDER — ACCU-CHEK FASTCLIX LANCETS MISC: 6 refills | Status: DC

## 2018-08-06 ENCOUNTER — Ambulatory Visit: Payer: Self-pay | Admitting: Psychology

## 2018-08-28 ENCOUNTER — Other Ambulatory Visit: Payer: Self-pay | Admitting: Family Medicine

## 2018-08-28 DIAGNOSIS — E1140 Type 2 diabetes mellitus with diabetic neuropathy, unspecified: Secondary | ICD-10-CM

## 2018-08-28 DIAGNOSIS — Z794 Long term (current) use of insulin: Principal | ICD-10-CM

## 2018-08-28 DIAGNOSIS — E118 Type 2 diabetes mellitus with unspecified complications: Secondary | ICD-10-CM

## 2018-08-30 ENCOUNTER — Telehealth: Payer: Self-pay | Admitting: Pharmacist

## 2018-08-30 ENCOUNTER — Ambulatory Visit: Payer: Medicaid Other | Admitting: Pharmacist

## 2018-08-30 NOTE — Telephone Encounter (Signed)
Contacted patient to follow up on blood sugars, nortriptyline dose increase tolerability, and ability to obtain nasal glucagon. Left HIPAA compliant message for patient to return my call at his convenience.   Catie Darnelle Maffucci, PharmD PGY2 Ambulatory Care Pharmacy Resident, [facility] Phone: [phone]

## 2018-08-31 ENCOUNTER — Encounter: Payer: Self-pay | Admitting: Family Medicine

## 2018-08-31 NOTE — Telephone Encounter (Signed)
Patient returned my call. I contacted him, had to leave a message. Informed him that we were calling to see if he had any medication related concerns after our last appointment, and if so, to give me a call back. If not, we could follow up again at the rescheduled appointment with Dr. Valentina Lucks later this month.   Catie Darnelle Maffucci, PharmD PGY2 Ambulatory Care Pharmacy Resident, [place] [facility] Phone: [phone]

## 2018-09-13 ENCOUNTER — Ambulatory Visit: Payer: Medicaid Other | Admitting: Pharmacist

## 2018-09-18 ENCOUNTER — Other Ambulatory Visit: Payer: Self-pay | Admitting: Family Medicine

## 2018-09-18 DIAGNOSIS — E1140 Type 2 diabetes mellitus with diabetic neuropathy, unspecified: Secondary | ICD-10-CM

## 2018-09-18 DIAGNOSIS — Z794 Long term (current) use of insulin: Principal | ICD-10-CM

## 2018-09-18 MED ORDER — EMPAGLIFLOZIN 25 MG PO TABS: 25.0000 mg | ORAL_TABLET | Freq: Every day | ORAL | 2 refills | Status: DC

## 2018-09-27 ENCOUNTER — Ambulatory Visit: Payer: Medicaid Other | Admitting: Pharmacist

## 2018-09-30 ENCOUNTER — Other Ambulatory Visit: Payer: Self-pay | Admitting: Family Medicine

## 2018-10-03 DIAGNOSIS — M791 Myalgia, unspecified site: Secondary | ICD-10-CM | POA: Diagnosis not present

## 2018-10-03 DIAGNOSIS — M542 Cervicalgia: Secondary | ICD-10-CM | POA: Diagnosis not present

## 2018-10-03 DIAGNOSIS — G518 Other disorders of facial nerve: Secondary | ICD-10-CM | POA: Diagnosis not present

## 2018-10-03 DIAGNOSIS — G43719 Chronic migraine without aura, intractable, without status migrainosus: Secondary | ICD-10-CM | POA: Diagnosis not present

## 2018-10-10 ENCOUNTER — Telehealth: Payer: Self-pay

## 2018-10-10 ENCOUNTER — Encounter: Payer: Self-pay | Admitting: Sports Medicine

## 2018-10-10 ENCOUNTER — Ambulatory Visit: Payer: Medicaid Other | Admitting: Sports Medicine

## 2018-10-10 ENCOUNTER — Other Ambulatory Visit: Payer: Self-pay | Admitting: Family Medicine

## 2018-10-10 VITALS — BP 126/72 | Ht 67.5 in | Wt 182.0 lb

## 2018-10-10 DIAGNOSIS — Z794 Long term (current) use of insulin: Principal | ICD-10-CM

## 2018-10-10 DIAGNOSIS — F411 Generalized anxiety disorder: Secondary | ICD-10-CM | POA: Diagnosis not present

## 2018-10-10 DIAGNOSIS — M1731 Unilateral post-traumatic osteoarthritis, right knee: Secondary | ICD-10-CM

## 2018-10-10 DIAGNOSIS — M25561 Pain in right knee: Secondary | ICD-10-CM

## 2018-10-10 DIAGNOSIS — E1140 Type 2 diabetes mellitus with diabetic neuropathy, unspecified: Secondary | ICD-10-CM

## 2018-10-10 DIAGNOSIS — F900 Attention-deficit hyperactivity disorder, predominantly inattentive type: Secondary | ICD-10-CM | POA: Diagnosis not present

## 2018-10-10 DIAGNOSIS — F3181 Bipolar II disorder: Secondary | ICD-10-CM | POA: Diagnosis not present

## 2018-10-10 MED ORDER — MELOXICAM 15 MG PO TABS: ORAL_TABLET | ORAL | 2 refills | Status: DC

## 2018-10-10 MED ORDER — METHYLPREDNISOLONE ACETATE 40 MG/ML IJ SUSP
40.0000 mg | Freq: Once | INTRAMUSCULAR | Status: AC
  Administered 2018-10-10: 40 mg via INTRA_ARTICULAR

## 2018-10-10 NOTE — Progress Notes (Signed)
Patrick Elliott - 58 y.o. male MRN [id number]  Date of birth: April 14, 1961   Chief complaint: R knee pain  SUBJECTIVE:    History of present illness: 58 year old male with a history of a right-sided ACL repair surgery 25 years ago who presents today with a 5-day history of knee pain.  He states he was trying to crawl underneath his garage and he felt a twisting-like motion of his right knee.  Immediately afterwards he noticed pain on the medial aspect of the knee with mild swelling.  Attempts to ambulate afterwards were difficult secondary to pain and feeling like his knee was unstable.  He started walking with a cane in his right arm for assisted support.  Before this injury, intermittently he had a dull type pain in his knee with intermittent swelling.  This was managed just with over-the-counter anti-inflammatories.  Since the injury, his pain is now more sharp in nature and rated 6 out of 10.  Denies any locking or popping of the knee.  Intermittently he feels like his knee gives out on him.  Denies any weakness of the knee.  No numbness or tingling of the knee.  No rashes or feelings of warmth of his knee.   Review of systems:  As stated above   Interval past medical history, surgical history, family history, and social history obtained and are unchanged.   Of note, he has a history of an ACL repair surgery 25 years ago as well as a history of tibial ORIF bilaterally with subsequent hardware removal per the patient.  He is also a type 2 insulin-dependent diabetic.  He has bipolar disorder and a history of alcohol abuse.  He is a former smoker.  Medications reviewed in detail.  Of note he is on insulin. Allergies reviewed and updated.  Of note he is allergic to penicillin and sulfa.  OBJECTIVE:  Physical exam: Vital signs are reviewed. BP 126/72   Ht 5' 7.5" (1.715 m)   Wt 182 lb (82.6 kg)   BMI 28.08 kg/m   Gen.: Alert, oriented, appears stated age, in no apparent  distress Integumentary: No rashes or ecchymoses.  Prior surgical scars noted on the anterior aspect of the right knee. Neurologic:  Sensation is intact to light touch L4-S1 Gait: Antalgic gait favoring his right side with an assisted cane in his right hand. Musculoskeletal: Inspection of his right knee demonstrates mild swelling.  He has a mild palpable effusion.  Mild tenderness to palpation over his medial joint line.  Mild patellofemoral crepitus with range of motion.  Full range of motion knee flexion extension.  Strength testing is 5 out of 5 in resisted knee flexion and extension.  Negative anterior posterior drawer.  Negative Lockman.  McMurray's does demonstrate mild tenderness on the medial joint line with provocation.  Positive patellar grind.  Negative varus valgus stress.  Neurovascularly intact.  Distal pulses +2.  Unable to perform [facility] secondary to feeling of instability of the knee.    ASSESSMENT & PLAN: Post-traumatic osteoarthritis of right knee -Hinged knee brace given for support of his knee to be worn with activity  INJECTION: Patient was given informed consent, signed copy in the chart. Appropriate time out was taken. Area prepped and draped in usual sterile fashion. 1 cc of methylprednisolone 40 mg/ml plus  2 cc of 1% lidocaine without epinephrine was injected into the intra-articular R knee using a(n) anterolateral approach. The patient tolerated the procedure well. There were no complications. Post procedure instructions  were given.  He was given a prescription for meloxicam 15 mg daily.  Please check with your primary care physician to make sure your diabetes is controlled enough to take an anti-inflammatory medicine as it is renally cleared.  Follow-up in approximately 4 to 6 weeks.  Start a home exercise program with knee range of motion and flexion/extension resistance exercises.   Right knee weightbearing x-rays are ordered today.  I will contact you with the  results of these.  If the x-rays do not show moderate or severe arthritis and he continues to have persistent symptoms, he may be in need of advanced imaging i.e. an MRI to rule out medial meniscus tear.   Orders Placed This Encounter  Procedures  . DG Knee AP/LAT W/Sunrise Right    Standing Status:   Future    Standing Expiration Date:   12/09/2019    Order Specific Question:   Reason for Exam (SYMPTOM  OR DIAGNOSIS REQUIRED)    Answer:   post traumatic OA; RIGHT KNEE PAIN    Order Specific Question:   Preferred imaging location?    Answer:   [facility]    Order Specific Question:   Radiology Contrast Protocol - do NOT remove file path    Answer:   [email or web address]    Meds ordered this encounter  Medications  . meloxicam (MOBIC) 15 MG tablet    Sig: Take one pill a day with food for 7 days and then prn thereafter    Dispense:  40 tablet    Refill:  2  . methylPREDNISolone acetate (DEPO-MEDROL) injection 40 mg      Clydene Laming, DO Sports Medicine Fellow [place]  I was the preceptor for this visit and available for immediate consultation Shellia Cleverly, DO

## 2018-10-10 NOTE — Telephone Encounter (Signed)
Received fax from [place] requesting prior authorization of Pregabalin. Prior approval completed via [place] [employer].  Med approved for 10/10/18 - 10/10/19.  Prior approval # [id number].  [facility] pharmacy informed.  Esau Grew, RN

## 2018-10-10 NOTE — Telephone Encounter (Signed)
Wrong dose. Correct dose sent on 09/18/18 with refills.

## 2018-10-10 NOTE — Patient Instructions (Signed)
It was great to see you today for your office visit.  Today we gave you a steroid injection of your knee.  Please allow 48 hours to 1 week for the steroid to fully kick in.  You may do activity as tolerated.  Try and wean out of the cane when able.  I did send in a strong anti-inflammatory, meloxicam for you to take until the steroid kicks in.  Please confirm with your primary care physician that you are diabetes has not affected your kidneys enough to the point where you cannot tolerate anti-inflammatory medications.  We also gave you a hinged knee brace for you to wear as tolerated.  X-rays of your knee were also ordered.  I will contact you with the results of these.  Follow-up tentatively in 6 weeks.

## 2018-10-10 NOTE — Assessment & Plan Note (Addendum)
-  Hinged knee brace given for support of his knee to be worn with activity  INJECTION: Patient was given informed consent, signed copy in the chart. Appropriate time out was taken. Area prepped and draped in usual sterile fashion. 1 cc of methylprednisolone 40 mg/ml plus  2 cc of 1% lidocaine without epinephrine was injected into the intra-articular R knee using a(n) anterolateral approach. The patient tolerated the procedure well. There were no complications. Post procedure instructions were given.  He was given a prescription for meloxicam 15 mg daily.  Please check with your primary care physician to make sure your diabetes is controlled enough to take an anti-inflammatory medicine as it is renally cleared.  Follow-up in approximately 4 to 6 weeks.  Start a home exercise program with knee range of motion and flexion/extension resistance exercises.   Right knee weightbearing x-rays are ordered today.  I will contact you with the results of these.  If the x-rays do not show moderate or severe arthritis and he continues to have persistent symptoms, he may be in need of advanced imaging i.e. an MRI to rule out medial meniscus tear.

## 2018-10-15 ENCOUNTER — Other Ambulatory Visit: Payer: Self-pay | Admitting: Family Medicine

## 2018-10-15 DIAGNOSIS — E1140 Type 2 diabetes mellitus with diabetic neuropathy, unspecified: Secondary | ICD-10-CM

## 2018-10-15 DIAGNOSIS — Z794 Long term (current) use of insulin: Principal | ICD-10-CM

## 2018-10-16 NOTE — Telephone Encounter (Signed)
Can pharmacy please be contacted about this autorefill? Patient is on 25mg  which was resent recently with refills. This is the 3rd refill request for the wrong dose that is being refused.

## 2018-10-16 NOTE — Telephone Encounter (Signed)
Patient has an appointment with Dr. Valentina Lucks on 10-18-2018.  Patrick Elliott,CMA

## 2018-10-18 ENCOUNTER — Ambulatory Visit
Admission: RE | Admit: 2018-10-18 | Discharge: 2018-10-18 | Disposition: A | Discharge status: Home / Self Care | Payer: Medicaid Other | Source: Ambulatory Visit | Attending: Sports Medicine | Admitting: Sports Medicine

## 2018-10-18 ENCOUNTER — Ambulatory Visit (INDEPENDENT_AMBULATORY_CARE_PROVIDER_SITE_OTHER): Payer: Medicaid Other | Admitting: Pharmacist

## 2018-10-18 DIAGNOSIS — Z794 Long term (current) use of insulin: Secondary | ICD-10-CM | POA: Diagnosis not present

## 2018-10-18 DIAGNOSIS — E1140 Type 2 diabetes mellitus with diabetic neuropathy, unspecified: Secondary | ICD-10-CM

## 2018-10-18 DIAGNOSIS — M25561 Pain in right knee: Secondary | ICD-10-CM

## 2018-10-18 MED ORDER — FIASP FLEXTOUCH 100 UNIT/ML ~~LOC~~ SOPN: 6.0000 [IU] | PEN_INJECTOR | Freq: Two times a day (BID) | SUBCUTANEOUS | Status: DC

## 2018-10-18 NOTE — Progress Notes (Signed)
S:    Chief Complaint  Patient presents with  . Medication Management    diabetes   Patient arrives complaining of pain, ambulates with cane.  Presents for diabetes evaluation, education, and management at the follow up as long-term chronic collaborative care with PCP Dr. Shawna Orleans.Last saw PCP on 02/20/2018. Last seen by pharmacy clinic 07/26/2018, when Jardiance was increased to 25mg  daily and nortipryline was increased to 75mg  daily.  Patient reports a lot of pain with his knee and had a recent steroid shot about 1 week ago with Sports Medicine on 10/10/2018. This injection has not helped his pain. Has used intranasal glucagon and he thinks it is a lot easier to use. Tolerating increased dose of nortriptyline and Jardiance since last visit. Normally taking around 5-7 units of Fiasp. He wonders if he could use continuous glucose monitoring instead of frequent finger sticks. He reports waking up 4-5 times per night to urinate  Seeing some occasional 300-350s 7-day average: 230 14-day average: 228  Patient reports adherence with medications.  Current diabetes medications include: Metformin, Lantus, Fiasp, Victoza, and Jardiance Current hypertension medications include: Carvedilol 6.25 mg BID, Lisinopril 20 mg  Patient reported dietary habits: Eats 2 meals/day typically -He is aware of his meals that contain high amounts of carbohydrates  Patient reports neuropathy.  O:  Physical Exam Constitutional:      Appearance: Normal appearance.  Neurological:     Mental Status: He is alert.  Psychiatric:        Mood and Affect: Mood normal.    Review of Systems  Musculoskeletal: Positive for joint pain.  All other systems reviewed and are negative.   Lab Results  Component Value Date   HGBA1C 7.8 (A) 02/15/2018   Vitals:   10/18/18 1504  BP: 132/80  Pulse: 88  SpO2: 98%    Lipid Panel     Component Value Date/Time   CHOL 152 11/09/2017 1621   TRIG 230 (H) 11/09/2017 1621   HDL 42 11/09/2017 1621   CHOLHDL 3.6 11/09/2017 1621   CHOLHDL 3.9 11/02/2016 1551   VLDL 23 11/02/2016 1551   LDLCALC 64 11/09/2017 1621   LDLDIRECT 79 05/29/2009 2224    CBG average ~200-230s.    Clinical ASCVD: No The 10-year ASCVD risk score Mikey Bussing DC Jr., et al., 2013) is: 14.5%   Values used to calculate the score:     Age: 58 years     Sex: Male     Is Non-Hispanic African American: No     Diabetic: Yes     Tobacco smoker: No     Systolic Blood Pressure: 353 mmHg     Is BP treated: Yes     HDL Cholesterol: 42 mg/dL     Total Cholesterol: 152 mg/dL    A/P: Diabetes longstanding currently uncontrolled secondary to stress associated with knee pain. Patient is able to verbalize appropriate hypoglycemia management plan. Patient reports adherence with medication. Control is suboptimal due to knee pain and recent steroid injection. -Continued basal insulin Lantus (insulin glargine) 32 units.  -Increased dose of rapid insulin Fiasp (insulin aspart) to one extra unit on the current sliding scale of 8 to 12 units in order to increase coverage while sugars are uncontrolled. -Continued GLP-1 Victoza (liraglutide) 1.8 mg daily.  -Continued SGLT2-I Jardiance (empagliflozin) 25 mg daily. Counseled on sick day rules. If patient continues to experience increased urination, patient was counseled to decrease dose to 10 mg.  -Extensively discussed pathophysiology of DM, recommended  lifestyle interventions, dietary effects on glycemic control -Counseled on s/sx of and management of hypoglycemia -Next A1C anticipated when glycemic control is stabilized after patient's knee pain is resolved.   ASCVD risk - primary prevention in patient with DM. Last LDL is controlled. ASCVD risk score is not >20%  -Continued simvastatin 20 mg.   Hypertension longstanding currently controlled.  BP goal = 130 mmHg. Patient reports adherence with medications. -Continue carvedilol 6.25 mg BID, lisinopril 20  mg  Written patient instructions provided.  Total time in face to face counseling 30 minutes.   Follow up PCP [facility] Visit in 4 weeks.   Patient seen with Emeline General, PharmD Candidate, Janae Bridgeman, PharmD, PGY1 resident and Courtney Heys, PharmD,  PGY2 Pharmacy Resident.

## 2018-10-18 NOTE — Patient Instructions (Addendum)
It was nice to see you today!  Continue to take Lantus 32 units/day, Jardiance 25mg  daily, Victoza 1.8mg  daily, and metformin 500 three times daily  Add an additional unit of Fiasp insulin to your mealtime insulin coverage when your sugars are high and you are eating carbohydrates  Please make a follow-up appointment with Dr. Shawna Orleans in one month and make an appointment with Dr. Valentina Lucks the month after.  Please call the clinic with any questions or concerns.

## 2018-10-18 NOTE — Assessment & Plan Note (Signed)
Diabetes longstanding currently uncontrolled secondary to stress associated with knee pain. Patient is able to verbalize appropriate hypoglycemia management plan. Patient reports adherence with medication. Control is suboptimal due to knee pain and recent steroid injection. -Continued basal insulin Lantus (insulin glargine) 32 units.  -Increased dose of rapid insulin Fiasp (insulin aspart) to one extra unit on the current sliding scale of 8 to 12 units in order to increase coverage while sugars are uncontrolled. -Continued GLP-1 Victoza (liraglutide) 1.8 mg daily.  -Continued SGLT2-I Jardiance (empagliflozin) 25 mg daily. Counseled on sick day rules. If patient continues to experience increased urination, patient was counseled to decrease dose to 10 mg.  -Extensively discussed pathophysiology of DM, recommended lifestyle interventions, dietary effects on glycemic control -Counseled on s/sx of and management of hypoglycemia -Next A1C anticipated when glycemic control is stabilized after patient's knee pain is resolved.

## 2018-10-19 ENCOUNTER — Telehealth: Payer: Self-pay | Admitting: *Deleted

## 2018-10-19 ENCOUNTER — Other Ambulatory Visit: Payer: Self-pay

## 2018-10-19 MED ORDER — MELOXICAM 15 MG PO TABS: ORAL_TABLET | ORAL | 2 refills | Status: DC

## 2018-10-19 NOTE — Progress Notes (Signed)
Patient ID: Patrick Elliott, male   DOB: 06/03/1961, 58 y.o.   MRN: [id number] Reviewed: Agree with Dr. Graylin Shiver documentation and management.

## 2018-10-19 NOTE — Telephone Encounter (Signed)
Informed pt that Dr Lunette Stands looked at the images are the results were severe arthritis...Marland KitchenMarland KitchenMarland Kitchen  Patient stated the shot given in the office worked for couple of days but later wore off.   Patient will pick up his rx for mobic today (Dr Lunette Stands isnt recognized by medicare per the patients pharmacist.... Called in mobic under Dr Micheline Chapman today) and try the medication for a couple of weeks to see if any improvement.  Informed pt if no improvement, next step per Dr Lunette Stands would be a referral to Ortho  Patient in agreements

## 2018-10-22 ENCOUNTER — Other Ambulatory Visit: Payer: Self-pay | Admitting: *Deleted

## 2018-10-22 NOTE — Progress Notes (Signed)
[facility] Dr Erlinda Hong Wednesday March 4th at [place], [place], [place] [address] Phone: [phone]

## 2018-10-24 ENCOUNTER — Ambulatory Visit (INDEPENDENT_AMBULATORY_CARE_PROVIDER_SITE_OTHER): Payer: Medicaid Other | Admitting: Orthopaedic Surgery

## 2018-10-24 ENCOUNTER — Encounter (INDEPENDENT_AMBULATORY_CARE_PROVIDER_SITE_OTHER): Payer: Self-pay | Admitting: Orthopaedic Surgery

## 2018-10-24 DIAGNOSIS — M1731 Unilateral post-traumatic osteoarthritis, right knee: Secondary | ICD-10-CM | POA: Diagnosis not present
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
## 2018-10-25 LAB — HEMOGLOBIN A1C
Hgb A1c MFr Bld: 8.1 % of total Hgb — ABNORMAL HIGH (ref <5.7)
Mean Plasma Glucose: 186 (calc)
eAG (mmol/L): 10.3 (calc)

## 2018-10-25 LAB — TIQ-NTM

## 2018-10-25 NOTE — Progress Notes (Signed)
A1c level is just barely elevated at 8.1.  please let him know that it needs to be below 8.0 before proceeding with TKA.  He should work with his PCP to get it in better control so that we can do the TKA.  Follow up in 2 months.

## 2018-10-26 ENCOUNTER — Encounter: Payer: Self-pay | Admitting: Family Medicine

## 2018-10-26 ENCOUNTER — Telehealth: Payer: Self-pay | Admitting: Family Medicine

## 2018-10-26 ENCOUNTER — Telehealth (INDEPENDENT_AMBULATORY_CARE_PROVIDER_SITE_OTHER): Payer: Self-pay | Admitting: Orthopaedic Surgery

## 2018-10-26 ENCOUNTER — Telehealth (INDEPENDENT_AMBULATORY_CARE_PROVIDER_SITE_OTHER): Payer: Self-pay

## 2018-10-26 ENCOUNTER — Other Ambulatory Visit: Payer: Self-pay | Admitting: Family Medicine

## 2018-10-26 DIAGNOSIS — M1731 Unilateral post-traumatic osteoarthritis, right knee: Secondary | ICD-10-CM

## 2018-10-26 NOTE — Telephone Encounter (Signed)
See also other note. Tried calling. No answer. LM

## 2018-10-26 NOTE — Telephone Encounter (Signed)
I tried calling, no answer, went straight to VM. LM advising.

## 2018-10-26 NOTE — Telephone Encounter (Signed)
Patrick Koyanagi, MD  Precious Bard, RMA        A1c level is just barely elevated at 8.1. please let him know that it needs to be below 8.0 before proceeding with TKA. He should work with his PCP to get it in better control so that we can do the TKA. Follow up in 2 months.      Called patient no answer. LMOM to return our call to advise on message above.

## 2018-10-26 NOTE — Telephone Encounter (Signed)
Received fax from [facility] for surgical clearance. Given patient has CHF and is insulin dependent DM he needs a pre op appointment for clearance with bloodwork and EKG and assessment of functional status. Can he please be called to schedule. The form will be placed in my paper inbox for completion after assessment.

## 2018-10-26 NOTE — Telephone Encounter (Signed)
Patient called needing the results of his labs. The number to contact patient is [phone]

## 2018-10-29 ENCOUNTER — Telehealth (INDEPENDENT_AMBULATORY_CARE_PROVIDER_SITE_OTHER): Payer: Self-pay | Admitting: Orthopaedic Surgery

## 2018-10-29 ENCOUNTER — Telehealth (INDEPENDENT_AMBULATORY_CARE_PROVIDER_SITE_OTHER): Payer: Self-pay

## 2018-10-29 NOTE — Telephone Encounter (Signed)
Patient advised of lab results he is wanting to know what he can do for his pain until he gets his A1C down. He wants to know if he could have another cortisone injection? Tylenol also not really helping pain

## 2018-10-29 NOTE — Telephone Encounter (Signed)
IC s/w patient and advised per note as stated by Dr Erlinda Hong see previous note

## 2018-10-29 NOTE — Telephone Encounter (Signed)
Patient called asked for a call back concerning his lab results. The number to contact patient is [phone]

## 2018-10-29 NOTE — Telephone Encounter (Signed)
Another injection will only make his a1c worse.  Tramadol #30 if he wants it

## 2018-10-30 NOTE — Telephone Encounter (Signed)
Tried calling patient to discuss. No answer. LM to [facility]

## 2018-11-01 NOTE — Telephone Encounter (Signed)
LM for patient ok per DPR asking him to call back for surgery clearance.  Norma Montemurro,CMA

## 2018-11-06 ENCOUNTER — Other Ambulatory Visit: Payer: Self-pay | Admitting: Family Medicine

## 2018-11-06 DIAGNOSIS — E1140 Type 2 diabetes mellitus with diabetic neuropathy, unspecified: Secondary | ICD-10-CM

## 2018-11-06 DIAGNOSIS — Z794 Long term (current) use of insulin: Principal | ICD-10-CM

## 2018-11-06 MED ORDER — EMPAGLIFLOZIN 25 MG PO TABS: 25.0000 mg | ORAL_TABLET | Freq: Every day | ORAL | 2 refills | Status: DC

## 2018-11-06 NOTE — Telephone Encounter (Signed)
Appt made for 11/13/18. Christen Bame, CMA

## 2018-11-06 NOTE — Telephone Encounter (Signed)
Pt is ok with canceling his appt, but would like for Dr. Shawna Orleans to call him to discuss pain management Christen Bame, CMA

## 2018-11-06 NOTE — Telephone Encounter (Signed)
Called patient to discuss postponing preop visit given the need for social distancing d/t COVID since this operation is for a total knee replacement which is not time sensitive. No response so left message asking for patient to call back. Plan to recommend postponing both preop visit and surgery at this time.

## 2018-11-07 NOTE — Telephone Encounter (Signed)
Called again. No response.

## 2018-11-07 NOTE — Telephone Encounter (Signed)
Pt calling again, informed him to keep his phone on him and answer any blocked calls. Pt wants to talk about pain management. Will route back to [place].

## 2018-11-07 NOTE — Telephone Encounter (Addendum)
Called patient and left message again. Appreciated confirmation of patient's phone number. Have been and will continue to use [phone] to attempt to contact patient.

## 2018-11-07 NOTE — Telephone Encounter (Signed)
Called patient. No response. Left message that was returning patient call.

## 2018-11-07 NOTE — Telephone Encounter (Signed)
Patient has returned your call again. States [phone] is the best number to call and he has his phone.  Advised him to answer any call that comes in even if it says "blocked" or "unknown". Stated he would.  Danley Danker, RN [facility] RN)

## 2018-11-08 NOTE — Telephone Encounter (Signed)
Called patient again. No response. Left message asking patient how he would like to proceed given he has been unreachable by phone.

## 2018-11-08 NOTE — Telephone Encounter (Signed)
Called patient again, no response. Left message stating I would try to call him around 1pm today.

## 2018-11-09 MED ORDER — MELOXICAM 15 MG PO TABS: ORAL_TABLET | ORAL | 2 refills | Status: DC

## 2018-11-09 NOTE — Telephone Encounter (Signed)
Called patient. Did not pick up again. Left message that patient could communicate a good time for him for me to call him and also pick up if he sees a phone number that starts with [phone].

## 2018-11-09 NOTE — Addendum Note (Signed)
Addended by: Bufford Lope on: 11/09/2018 02:38 PM   Modules accepted: Orders

## 2018-11-09 NOTE — Telephone Encounter (Signed)
Patient would like to know if it would be better to try meloxicam or tramadol for his uncontrolled knee pain. He has been hesitant to do so for the potential interaction with lithium. He has done with well with ibuprofen in the past and his renal function is good so advised that he could try meloxicam.  He was counseled on symptoms of lithium toxicity as a precaution.  Would like to work on his diabetes control so that he may have this knee surgery in the future.  He has been compliant on his metformin, Lantus 32 units, Victoza 1.8 mg daily, Jardiance 25 mg daily.  He has been taking fiasp on a sliding scale of 8 to 12 units.  His home readings are as below 7 day average 238 14 day average 240 30 day average is 235 90 day average 238 All his readings have been under 300s. Has had 2 lows under 100 and had to use glucagon.   Discussed increasing fiasp to 1 extra unit to have a sliding scale of 9 to 13 units.  He will call back in 1 month with home readings or come in for visit at that time.

## 2018-11-09 NOTE — Telephone Encounter (Signed)
Pt returns call. Christen Bame, CMA

## 2018-11-13 ENCOUNTER — Ambulatory Visit: Payer: Medicaid Other | Admitting: Family Medicine

## 2018-11-13 ENCOUNTER — Telehealth: Payer: Self-pay | Admitting: *Deleted

## 2018-11-13 ENCOUNTER — Other Ambulatory Visit: Payer: Self-pay | Admitting: Family Medicine

## 2018-11-13 DIAGNOSIS — E1140 Type 2 diabetes mellitus with diabetic neuropathy, unspecified: Secondary | ICD-10-CM

## 2018-11-13 DIAGNOSIS — Z794 Long term (current) use of insulin: Principal | ICD-10-CM

## 2018-11-13 NOTE — Telephone Encounter (Signed)
Prior approval for [place] completed via [employer].  Med approved for 11/13/2018 - 11/13/2019  Prior approval # [id number].  [facility] pharmacy informed.  Christen Bame, CMA

## 2018-11-29 ENCOUNTER — Other Ambulatory Visit: Payer: Self-pay | Admitting: *Deleted

## 2018-11-29 DIAGNOSIS — F900 Attention-deficit hyperactivity disorder, predominantly inattentive type: Secondary | ICD-10-CM | POA: Diagnosis not present

## 2018-11-29 DIAGNOSIS — G4700 Insomnia, unspecified: Secondary | ICD-10-CM

## 2018-11-29 DIAGNOSIS — F3181 Bipolar II disorder: Secondary | ICD-10-CM | POA: Diagnosis not present

## 2018-11-29 DIAGNOSIS — F411 Generalized anxiety disorder: Secondary | ICD-10-CM | POA: Diagnosis not present

## 2018-11-29 MED ORDER — ZOLPIDEM TARTRATE 10 MG PO TABS: 10.0000 mg | ORAL_TABLET | Freq: Every day | ORAL | 0 refills | Status: DC

## 2018-11-29 NOTE — Telephone Encounter (Signed)
Contacted pharmacy.  Pt does not see Dr. Tammi Klippel anymore and asked pharmacy to call into Dr. Shawna Orleans. Christen Bame, CMA

## 2018-11-29 NOTE — Telephone Encounter (Signed)
[place] PMP reviewed. No red flags. 

## 2018-11-29 NOTE — Telephone Encounter (Signed)
This medication is managed by Dr. Molli Barrows. Per [place] PMP it was last refilled by him 10/28/18. Can the pharmacy please send to the correct provider?

## 2018-11-29 NOTE — Telephone Encounter (Signed)
Called patient. Patient is already connected with psychiatry Patrick Elliott and has second telehealth visit with him this week. Discussed that can give one time refill.

## 2018-12-02 ENCOUNTER — Other Ambulatory Visit: Payer: Self-pay | Admitting: Family Medicine

## 2018-12-02 DIAGNOSIS — E1140 Type 2 diabetes mellitus with diabetic neuropathy, unspecified: Secondary | ICD-10-CM

## 2018-12-02 DIAGNOSIS — Z794 Long term (current) use of insulin: Principal | ICD-10-CM

## 2018-12-07 ENCOUNTER — Encounter: Payer: Self-pay | Admitting: Family Medicine

## 2018-12-07 ENCOUNTER — Encounter (INDEPENDENT_AMBULATORY_CARE_PROVIDER_SITE_OTHER): Payer: Self-pay | Admitting: Orthopaedic Surgery

## 2018-12-07 NOTE — Telephone Encounter (Signed)
Spoke with Patriciaann Clan PA-C who will take over prescribing of all psychiatric medications. All psych medications (lamictal, lithium, ritalin, ambien, pamelor) were sent in 11/29/18. Patient apparently asked for PA to take over all of his medications which appears to a misunderstanding.

## 2018-12-07 NOTE — Telephone Encounter (Signed)
Attempted to call [place] PA-C to discuss. Awaiting call back.

## 2018-12-13 ENCOUNTER — Other Ambulatory Visit: Payer: Self-pay

## 2018-12-13 DIAGNOSIS — F3181 Bipolar II disorder: Secondary | ICD-10-CM | POA: Diagnosis not present

## 2018-12-13 DIAGNOSIS — F411 Generalized anxiety disorder: Secondary | ICD-10-CM | POA: Diagnosis not present

## 2018-12-13 DIAGNOSIS — F900 Attention-deficit hyperactivity disorder, predominantly inattentive type: Secondary | ICD-10-CM | POA: Diagnosis not present

## 2018-12-14 MED ORDER — PREGABALIN 100 MG PO CAPS: 100.0000 mg | ORAL_CAPSULE | Freq: Three times a day (TID) | ORAL | 3 refills | Status: DC

## 2018-12-14 NOTE — Telephone Encounter (Signed)
[place] PMP reviewed. No red flags. 

## 2018-12-19 DIAGNOSIS — M791 Myalgia, unspecified site: Secondary | ICD-10-CM | POA: Diagnosis not present

## 2018-12-19 DIAGNOSIS — G518 Other disorders of facial nerve: Secondary | ICD-10-CM | POA: Diagnosis not present

## 2018-12-19 DIAGNOSIS — M542 Cervicalgia: Secondary | ICD-10-CM | POA: Diagnosis not present

## 2018-12-19 DIAGNOSIS — G43719 Chronic migraine without aura, intractable, without status migrainosus: Secondary | ICD-10-CM | POA: Diagnosis not present

## 2018-12-25 ENCOUNTER — Other Ambulatory Visit: Payer: Self-pay

## 2018-12-25 MED ORDER — METHOCARBAMOL 500 MG PO TABS: 500.0000 mg | ORAL_TABLET | Freq: Three times a day (TID) | ORAL | 3 refills | Status: DC | PRN

## 2019-01-02 DIAGNOSIS — G518 Other disorders of facial nerve: Secondary | ICD-10-CM | POA: Diagnosis not present

## 2019-01-02 DIAGNOSIS — M542 Cervicalgia: Secondary | ICD-10-CM | POA: Diagnosis not present

## 2019-01-02 DIAGNOSIS — G43719 Chronic migraine without aura, intractable, without status migrainosus: Secondary | ICD-10-CM | POA: Diagnosis not present

## 2019-01-02 DIAGNOSIS — M791 Myalgia, unspecified site: Secondary | ICD-10-CM | POA: Diagnosis not present

## 2019-01-16 ENCOUNTER — Other Ambulatory Visit: Payer: Self-pay

## 2019-01-16 MED ORDER — LISINOPRIL 20 MG PO TABS: 20.0000 mg | ORAL_TABLET | Freq: Every day | ORAL | 3 refills | Status: DC

## 2019-02-05 ENCOUNTER — Other Ambulatory Visit: Payer: Self-pay | Admitting: *Deleted

## 2019-02-05 DIAGNOSIS — E118 Type 2 diabetes mellitus with unspecified complications: Secondary | ICD-10-CM

## 2019-02-05 MED ORDER — INSULIN PEN NEEDLE 32G X 4 MM MISC: 2 refills | Status: DC

## 2019-02-07 DIAGNOSIS — H40013 Open angle with borderline findings, low risk, bilateral: Secondary | ICD-10-CM | POA: Diagnosis not present

## 2019-02-11 ENCOUNTER — Other Ambulatory Visit: Payer: Self-pay

## 2019-02-11 DIAGNOSIS — F900 Attention-deficit hyperactivity disorder, predominantly inattentive type: Secondary | ICD-10-CM | POA: Diagnosis not present

## 2019-02-11 DIAGNOSIS — F411 Generalized anxiety disorder: Secondary | ICD-10-CM | POA: Diagnosis not present

## 2019-02-11 DIAGNOSIS — F3181 Bipolar II disorder: Secondary | ICD-10-CM | POA: Diagnosis not present

## 2019-02-11 MED ORDER — CARVEDILOL 6.25 MG PO TABS: ORAL_TABLET | ORAL | 3 refills | Status: DC

## 2019-02-18 DIAGNOSIS — M542 Cervicalgia: Secondary | ICD-10-CM | POA: Diagnosis not present

## 2019-02-18 DIAGNOSIS — M791 Myalgia, unspecified site: Secondary | ICD-10-CM | POA: Diagnosis not present

## 2019-02-18 DIAGNOSIS — G43719 Chronic migraine without aura, intractable, without status migrainosus: Secondary | ICD-10-CM | POA: Diagnosis not present

## 2019-02-18 DIAGNOSIS — G518 Other disorders of facial nerve: Secondary | ICD-10-CM | POA: Diagnosis not present

## 2019-03-12 ENCOUNTER — Telehealth: Payer: Self-pay | Admitting: Pharmacist

## 2019-03-12 NOTE — Telephone Encounter (Signed)
Call from patient who was inquiring about visit and lab draws.  His psychiatrist would like to have lithium levels drawn at next lab draw.   He also shared that he would like to have his A1c below 8 so he can have knee surgery.   I informed him we were seeing patients and encouraged him to schedule a visit with his new PCP, Dr. Posey Pronto, where he can obtain these labs.    We briefly discussed his blood sugars.  This "brittle" DM patient appears to be handling his sugars fairly well since the start of Covid.  He has been isolated per his report.   No changes with visit today.

## 2019-03-14 ENCOUNTER — Telehealth: Payer: Self-pay | Admitting: *Deleted

## 2019-03-14 NOTE — Telephone Encounter (Signed)
-----   Message from Martyn Malay, MD sent at 03/13/2019  7:58 PM EDT ----- Regarding: FW: Labs and schedule visit for my new patient [facility]---see below. Can you please schedule him for an appointment with Dr. Posey Pronto.   Thanks!  ----- Message ----- From: Lattie Haw, MD Sent: 03/13/2019   6:53 PM EDT To: Laban Emperor Fleeger, CMA, Leavy Cella, [facility], # Subject: Labs and schedule visit for my new patient     Dear Janett Billow and Dr Owens Shark,  I received a message regarding this patient Patrick Elliott from Dr Lamar Laundry thanks. He has a background of diabetes and bipolar disorder. He would like a repeat Hb1Ac (last documented on Epic 02/15/18) and lithium-as per his psychiatrist at his next visit. Please could we schedule him to come for the next available appointment with me? I would like to establish a rapport with him. And could we also please schedule CBC, BMP, Hb1Ac and Lithium levels at this visit?  I cannot seem to message the 'white pool' from my basket. I would be grateful if you could forward on this message to my fellow white pool team so they can kindly arrange a visit and the labs.  Thank you, I appreciate it.  Kind regards  [place]

## 2019-03-14 NOTE — Telephone Encounter (Signed)
LVM for pt to call and schedule an appt with Dr. Posey Pronto. Please see note below. Ottis Stain, CMA

## 2019-03-14 NOTE — Telephone Encounter (Signed)
2nd attempt to contact pt to assist him in getting an appointment scheduled.  Will also send MyChart message as well. April Zimmerman Rumple, CMA

## 2019-03-20 ENCOUNTER — Telehealth: Payer: Self-pay | Admitting: Orthopaedic Surgery

## 2019-03-20 ENCOUNTER — Telehealth: Payer: Self-pay | Admitting: *Deleted

## 2019-03-20 NOTE — Telephone Encounter (Signed)
Returned call to patient left message to return call °

## 2019-03-20 NOTE — Telephone Encounter (Signed)
LVM for pt to call office to schedule an appointment to come in and see doctor for check up and labs that his other doctor is requesting, will also send a message via MyChart as well. Konstantina Nachreiner Zimmerman Rumple, CMA

## 2019-03-25 NOTE — Telephone Encounter (Signed)
Pt has appointment scheduled for 04/16/2019. Patrick Elliott, CMA

## 2019-04-03 ENCOUNTER — Other Ambulatory Visit: Payer: Self-pay

## 2019-04-03 DIAGNOSIS — M542 Cervicalgia: Secondary | ICD-10-CM | POA: Diagnosis not present

## 2019-04-03 DIAGNOSIS — Z794 Long term (current) use of insulin: Secondary | ICD-10-CM

## 2019-04-03 DIAGNOSIS — E1140 Type 2 diabetes mellitus with diabetic neuropathy, unspecified: Secondary | ICD-10-CM

## 2019-04-03 DIAGNOSIS — M791 Myalgia, unspecified site: Secondary | ICD-10-CM | POA: Diagnosis not present

## 2019-04-03 DIAGNOSIS — G43719 Chronic migraine without aura, intractable, without status migrainosus: Secondary | ICD-10-CM | POA: Diagnosis not present

## 2019-04-03 DIAGNOSIS — G518 Other disorders of facial nerve: Secondary | ICD-10-CM | POA: Diagnosis not present

## 2019-04-08 MED ORDER — JARDIANCE 25 MG PO TABS: 25.0000 mg | ORAL_TABLET | Freq: Every day | ORAL | 2 refills | Status: DC

## 2019-04-12 DIAGNOSIS — F900 Attention-deficit hyperactivity disorder, predominantly inattentive type: Secondary | ICD-10-CM | POA: Diagnosis not present

## 2019-04-12 DIAGNOSIS — F411 Generalized anxiety disorder: Secondary | ICD-10-CM | POA: Diagnosis not present

## 2019-04-12 DIAGNOSIS — F3181 Bipolar II disorder: Secondary | ICD-10-CM | POA: Diagnosis not present

## 2019-04-16 ENCOUNTER — Ambulatory Visit (INDEPENDENT_AMBULATORY_CARE_PROVIDER_SITE_OTHER): Payer: Medicaid Other | Admitting: Family Medicine

## 2019-04-16 ENCOUNTER — Other Ambulatory Visit: Payer: Self-pay | Admitting: Family Medicine

## 2019-04-16 ENCOUNTER — Other Ambulatory Visit: Payer: Self-pay

## 2019-04-16 VITALS — BP 140/80 | HR 112

## 2019-04-16 DIAGNOSIS — E1140 Type 2 diabetes mellitus with diabetic neuropathy, unspecified: Secondary | ICD-10-CM | POA: Diagnosis not present

## 2019-04-16 DIAGNOSIS — Z794 Long term (current) use of insulin: Secondary | ICD-10-CM

## 2019-04-16 LAB — POCT GLYCOSYLATED HEMOGLOBIN (HGB A1C): HbA1c, POC (controlled diabetic range): 7.6 % — AB (ref 0.0–7.0)

## 2019-04-16 MED ORDER — TRAMADOL HCL 50 MG PO TABS: ORAL_TABLET | ORAL | 2 refills | Status: DC

## 2019-04-16 NOTE — Progress Notes (Signed)
I  Sent in for Dr. Posey Pronto who is having trouble  With her [employer].

## 2019-04-16 NOTE — Progress Notes (Signed)
   Subjective:    Patient ID: Patrick Elliott, male    DOB: [birth year], 58 y.o.   MRN: [id number]   CC: Knee pain and diabetic check   HPI: Knee pain Pt known to have severe osteoarthitis in knees was due to have right TKR in March 2020. Was cancelled because of COVID. Reports that his knee pain got worse after falling on it this year. Sharp "breath taking" knee pain, exacerbated on movement and weight bearing. 8-10/10 severity. Uses tylenol and methocarbomol which does not help. Bought knee brace which takes the edge off. Also gets numbness in right foot sometimes.  Diabetes Hb1AC 7.6 today. Was 8.1 on last check so pt was concerned it would be high today. He wanted it to be under 8 so he would qualify to have his TKR. Pt compliant with his diabetic meds-lantus 32-34 units once daily, metformin 1g BID, empaglifozin 25mg  once daily and aspart-sliding scale. CBGs are usually around 200 and measures 5-6 times a day. Denies hypos, polyuria, polydipsia or changes in vision. Has a protein rich and low carbohydrate diet.  Smoking status reviewed   ROS: pertinent noted in the HPI   Denies chest pain, SOB, cough, dizziness, palpitations, abdo pain, urinary sx or change in bowel habit.  Past medical history, surgical, family, and social history reviewed and updated in the EMR as appropriate. Reviewed problem list.   Objective:  BP 140/80   Pulse (!) 112   SpO2 99%   Vitals and nursing note reviewed  General: NAD, pleasant, able to participate in exam Cardiac: RRR, S1 S2 present. normal heart sounds, no murmurs. Respiratory: CTAB, normal effort, No wheezes, rales or rhonchi [id number] soft non tender, bowel sounds present. Rectus diastasis noted in ventral abdominal wall when patient tenses abdominal wall muscles. Defect retracts on relaxation  Extremities: no edema or cyanosis. Skin: warm and dry, no rashes noted Neuro: alert, no obvious focal deficits Psych: Normal affect and mood    Assessment & Plan:    No problem-specific Assessment & Plan notes found for this encounter.  Diabetes Well controlled and HbA1c adequate today. Contionue current diabetic medication regime. Repeat Hb1Ac in 3 months and diabetic check with me.  Knee pain Likely secondary to OA and trauma to knee. Only relief will be TRK. Explained to pt to contact Ortho Surgeon to rebook his [place] has his diabetes is well controlled. Prescribed tramadol and advised pt to take with tylenol. Should avoid exacerbating factors such as excessive weight bearing. To try alternative treatments such as ice and continue with the knee brace prior to the operation.  Will request CBC and CMET for pre-op and as pts last labs were last year.   Lattie Haw, MD  [place] PGY-1

## 2019-04-16 NOTE — Progress Notes (Signed)
   Subjective:    Patient ID: Patrick Elliott, male    DOB: 02/06/1961, 58 y.o.   MRN: [id number]   CC: Knee pain and diabetic review  HPI:  Diabetic review  Smoking status reviewed   ROS: pertinent noted in the HPI    Past medical history, surgical, family, and social history reviewed and updated in the EMR as appropriate. Reviewed problem list.   Objective:  BP 140/80   Pulse (!) 112   SpO2 99%   Vitals and nursing note reviewed  General: NAD, pleasant, able to participate in exam Cardiac: RRR, S1 S2 present. normal heart sounds, no murmurs. Respiratory: CTAB, normal effort, No wheezes, rales or rhonchi Extremities: no edema or cyanosis. Skin: warm and dry, no rashes noted Neuro: alert, no obvious focal deficits Psych: Normal affect and mood   Assessment & Plan:    No problem-specific Assessment & Plan notes found for this encounter.   Lattie Haw, MD  [place] PGY-1

## 2019-04-16 NOTE — Patient Instructions (Addendum)
Patrick Elliott,  It was great to meet you today for the first time. I am your new PCP. Today we discussed your worsening knee pain. I have prescribed tramadol for you and advised you to take it with tylenol regularly. Please contact your surgeon Dr Tonita Cong to have the knee replacement scheduled as this will be the "cure" for your knee pain.    Your Hb1AC was 7.6 today which means you are doing a great job. Please continue as you are with your diabetic medications and healthy diet. We will see you again in 3 months to review your diabetes.  Please do not hesitate to contact me if you have further questions.  Wish you the best Kind regards Lattie Haw MD  PGY-1, [place]

## 2019-04-17 LAB — COMPREHENSIVE METABOLIC PANEL
ALT: 23 IU/L (ref 0–44)
AST: 20 IU/L (ref 0–40)
Albumin/Globulin Ratio: 1.7 (ref 1.2–2.2)
Albumin: 4.3 g/dL (ref 3.8–4.9)
Alkaline Phosphatase: 49 IU/L (ref 39–117)
BUN/Creatinine Ratio: 12 (ref 9–20)
BUN: 10 mg/dL (ref 6–24)
Bilirubin Total: 0.5 mg/dL (ref 0.0–1.2)
CO2: 20 mmol/L (ref 20–29)
Calcium: 9.8 mg/dL (ref 8.7–10.2)
Chloride: 105 mmol/L (ref 96–106)
Creatinine, Ser: 0.82 mg/dL (ref 0.76–1.27)
GFR calc Af Amer: 112 mL/min/{1.73_m2} (ref >59)
GFR calc non Af Amer: 97 mL/min/{1.73_m2} (ref >59)
Globulin, Total: 2.5 g/dL (ref 1.5–4.5)
Glucose: 205 mg/dL — ABNORMAL HIGH (ref 65–99)
Potassium: 4.4 mmol/L (ref 3.5–5.2)
Sodium: 138 mmol/L (ref 134–144)
Total Protein: 6.8 g/dL (ref 6.0–8.5)

## 2019-04-17 LAB — CBC
Hematocrit: 39.1 % (ref 37.5–51.0)
Hemoglobin: 14 g/dL (ref 13.0–17.7)
MCH: 29.8 pg (ref 26.6–33.0)
MCHC: 35.8 g/dL — ABNORMAL HIGH (ref 31.5–35.7)
MCV: 83 fL (ref 79–97)
Platelets: 86 10*3/uL — CL (ref 150–450)
RBC: 4.7 x10E6/uL (ref 4.14–5.80)
RDW: 12.4 % (ref 11.6–15.4)
WBC: 3.1 10*3/uL — ABNORMAL LOW (ref 3.4–10.8)

## 2019-04-18 ENCOUNTER — Telehealth: Payer: Self-pay | Admitting: *Deleted

## 2019-04-18 NOTE — Telephone Encounter (Signed)
PA needed on Tramadol.  Will submit online when note is complete (will need to send note per insurance requirements).  Christen Bame, CMA

## 2019-04-19 ENCOUNTER — Encounter: Payer: Self-pay | Admitting: Family Medicine

## 2019-04-19 NOTE — Telephone Encounter (Signed)
Prior approval for Tramadol completed via [place] [employer].  Med approved for 04/19/2019 - 10/16/2019.  [facility] pharmacy informed.  Christen Bame, CMA

## 2019-04-19 NOTE — Telephone Encounter (Signed)
Completed PA info in [place] Tracks for tramadol.  Status pending.  Will recheck status in 24 hours. Jessica Fleeger, CMA    

## 2019-04-29 ENCOUNTER — Encounter: Payer: Self-pay | Admitting: Family Medicine

## 2019-04-30 DIAGNOSIS — Z23 Encounter for immunization: Secondary | ICD-10-CM | POA: Diagnosis not present

## 2019-05-04 ENCOUNTER — Encounter: Payer: Self-pay | Admitting: Family Medicine

## 2019-05-07 ENCOUNTER — Telehealth: Payer: Self-pay | Admitting: *Deleted

## 2019-05-07 NOTE — Telephone Encounter (Signed)
Pt calls for 2 reasons:  1. Refill on methocarbamol and simvastatin  2. Pharmacy cant fill the tramadol due to an interaction between nortriptyline.  They will need PCP to call back first.   Christen Bame, CMA

## 2019-05-14 DIAGNOSIS — G518 Other disorders of facial nerve: Secondary | ICD-10-CM | POA: Diagnosis not present

## 2019-05-14 DIAGNOSIS — G43719 Chronic migraine without aura, intractable, without status migrainosus: Secondary | ICD-10-CM | POA: Diagnosis not present

## 2019-05-14 DIAGNOSIS — M791 Myalgia, unspecified site: Secondary | ICD-10-CM | POA: Diagnosis not present

## 2019-05-14 DIAGNOSIS — M542 Cervicalgia: Secondary | ICD-10-CM | POA: Diagnosis not present

## 2019-05-14 NOTE — Telephone Encounter (Signed)
Pt calling to rechek status. Christen Bame, CMA

## 2019-05-15 ENCOUNTER — Other Ambulatory Visit: Payer: Self-pay | Admitting: Family Medicine

## 2019-05-15 DIAGNOSIS — E785 Hyperlipidemia, unspecified: Secondary | ICD-10-CM

## 2019-05-15 MED ORDER — SIMVASTATIN 20 MG PO TABS: 20.0000 mg | ORAL_TABLET | Freq: Every day | ORAL | 3 refills | Status: DC

## 2019-05-15 MED ORDER — METHOCARBAMOL 500 MG PO TABS: 500.0000 mg | ORAL_TABLET | Freq: Three times a day (TID) | ORAL | 3 refills | Status: DC | PRN

## 2019-05-15 NOTE — Telephone Encounter (Signed)
Hi Jess, I have refilled both his statin and methocarbamol. I left the patient a voice note informing him.

## 2019-05-20 ENCOUNTER — Other Ambulatory Visit: Payer: Self-pay

## 2019-05-20 DIAGNOSIS — E785 Hyperlipidemia, unspecified: Secondary | ICD-10-CM

## 2019-05-20 MED ORDER — SIMVASTATIN 20 MG PO TABS: 20.0000 mg | ORAL_TABLET | Freq: Every day | ORAL | 3 refills | Status: DC

## 2019-05-20 MED ORDER — METHOCARBAMOL 500 MG PO TABS: 500.0000 mg | ORAL_TABLET | Freq: Three times a day (TID) | ORAL | 3 refills | Status: DC | PRN

## 2019-05-20 NOTE — Telephone Encounter (Signed)
Received fax from pharmacy for a refill request for meds refilled on 9/23. Methocarbamol and Simvastatin were refilled, however sent to print.  Will resend for patient.

## 2019-06-10 ENCOUNTER — Other Ambulatory Visit: Payer: Self-pay | Admitting: *Deleted

## 2019-06-10 DIAGNOSIS — E785 Hyperlipidemia, unspecified: Secondary | ICD-10-CM

## 2019-06-10 MED ORDER — METHOCARBAMOL 500 MG PO TABS: 500.0000 mg | ORAL_TABLET | Freq: Three times a day (TID) | ORAL | 3 refills | Status: DC | PRN

## 2019-06-10 MED ORDER — SIMVASTATIN 20 MG PO TABS: 20.0000 mg | ORAL_TABLET | Freq: Every day | ORAL | 3 refills | Status: DC

## 2019-06-10 NOTE — Telephone Encounter (Signed)
Refills sent.  We are researching why Dr. Posey Pronto is having trouble with these prescriptions.

## 2019-06-11 ENCOUNTER — Encounter: Payer: Self-pay | Admitting: Family Medicine

## 2019-06-11 ENCOUNTER — Other Ambulatory Visit: Payer: Self-pay | Admitting: *Deleted

## 2019-06-11 DIAGNOSIS — E118 Type 2 diabetes mellitus with unspecified complications: Secondary | ICD-10-CM

## 2019-06-11 NOTE — Telephone Encounter (Signed)
Sending to Dr. Andria Frames to fill due to the delay with Dr. Posey Pronto and Medicaid.  Christen Bame, CMA

## 2019-06-12 MED ORDER — INSULIN PEN NEEDLE 32G X 4 MM MISC: 2 refills | Status: DC

## 2019-06-12 MED ORDER — VICTOZA 18 MG/3ML ~~LOC~~ SOPN: PEN_INJECTOR | SUBCUTANEOUS | 3 refills | Status: DC

## 2019-06-12 MED ORDER — CARVEDILOL 6.25 MG PO TABS: ORAL_TABLET | ORAL | 3 refills | Status: DC

## 2019-06-12 MED ORDER — METFORMIN HCL 500 MG PO TABS: 500.0000 mg | ORAL_TABLET | Freq: Two times a day (BID) | ORAL | 3 refills | Status: DC

## 2019-06-12 MED ORDER — LANTUS SOLOSTAR 100 UNIT/ML ~~LOC~~ SOPN: PEN_INJECTOR | SUBCUTANEOUS | 11 refills | Status: DC

## 2019-06-12 MED ORDER — NORTRIPTYLINE HCL 25 MG PO CAPS: 50.0000 mg | ORAL_CAPSULE | Freq: Every day | ORAL | 3 refills | Status: DC

## 2019-07-03 DIAGNOSIS — F900 Attention-deficit hyperactivity disorder, predominantly inattentive type: Secondary | ICD-10-CM | POA: Diagnosis not present

## 2019-07-03 DIAGNOSIS — F3181 Bipolar II disorder: Secondary | ICD-10-CM | POA: Diagnosis not present

## 2019-07-03 DIAGNOSIS — F411 Generalized anxiety disorder: Secondary | ICD-10-CM | POA: Diagnosis not present

## 2019-07-24 ENCOUNTER — Telehealth: Payer: Self-pay | Admitting: *Deleted

## 2019-07-24 ENCOUNTER — Other Ambulatory Visit: Payer: Self-pay | Admitting: *Deleted

## 2019-07-24 NOTE — Telephone Encounter (Signed)
Pt recently had a tooth pulled and was Rx'd amoxicillin. He states that this made his sugars "go way up" but they are better now.  Pt was unaware that this med was on his allergy list because of nausea.  Pt would like input from Dr. Valentina Lucks on a good abx to request if he has to have another tooth pulled. Christen Bame, CMA

## 2019-07-25 MED ORDER — PREGABALIN 100 MG PO CAPS: 100.0000 mg | ORAL_CAPSULE | Freq: Three times a day (TID) | ORAL | 3 refills | Status: DC

## 2019-07-31 DIAGNOSIS — M791 Myalgia, unspecified site: Secondary | ICD-10-CM | POA: Diagnosis not present

## 2019-07-31 DIAGNOSIS — G43719 Chronic migraine without aura, intractable, without status migrainosus: Secondary | ICD-10-CM | POA: Diagnosis not present

## 2019-07-31 DIAGNOSIS — F900 Attention-deficit hyperactivity disorder, predominantly inattentive type: Secondary | ICD-10-CM | POA: Diagnosis not present

## 2019-07-31 DIAGNOSIS — M542 Cervicalgia: Secondary | ICD-10-CM | POA: Diagnosis not present

## 2019-07-31 DIAGNOSIS — G518 Other disorders of facial nerve: Secondary | ICD-10-CM | POA: Diagnosis not present

## 2019-08-01 ENCOUNTER — Telehealth: Payer: Self-pay | Admitting: Pharmacist

## 2019-08-01 DIAGNOSIS — F3181 Bipolar II disorder: Secondary | ICD-10-CM | POA: Diagnosis not present

## 2019-08-01 DIAGNOSIS — F411 Generalized anxiety disorder: Secondary | ICD-10-CM | POA: Diagnosis not present

## 2019-08-01 DIAGNOSIS — F900 Attention-deficit hyperactivity disorder, predominantly inattentive type: Secondary | ICD-10-CM | POA: Diagnosis not present

## 2019-08-01 MED ORDER — DEXCOM G6 SENSOR MISC: 1.0000 | 11 refills | Status: DC

## 2019-08-01 MED ORDER — DEXCOM G6 TRANSMITTER MISC: 1.0000 | 3 refills | Status: DC | PRN

## 2019-08-01 NOTE — Telephone Encounter (Signed)
Phone contacted attempted > 3 times and I have left multiple messages requesting call back to discuss his allergy report AND also the potential for him to get set up with Dexcom meter.   I will await his return call or scheduled appointment.

## 2019-08-01 NOTE — Telephone Encounter (Signed)
Patient returned call RE antibiotics with dental procedures.  He stated his reaction with Amoxicillin was concerning and he would like to consider an alternative antibiotic if required for future dental procedure(s).   We also discussed the Medicaid option of Dexcom G6 for monitoring his blood sugar.  He was very excited about this opportunity and described it as life-changing.  He has a new I-phone (12?).  Prior Auth approval process initiated.

## 2019-08-02 NOTE — Telephone Encounter (Signed)
Noted and agree. 

## 2019-08-02 NOTE — Telephone Encounter (Signed)
Noted thank you Dr Koval  

## 2019-08-05 ENCOUNTER — Telehealth: Payer: Self-pay | Admitting: *Deleted

## 2019-08-05 NOTE — Telephone Encounter (Signed)
PA required for continuous glucose meter thru medicaid.  Form completed and faxed to [facility] @ [phone].  Original in Triage office (purple folder) will scan once approved.  Christen Bame, CMA

## 2019-08-08 ENCOUNTER — Other Ambulatory Visit: Payer: Self-pay

## 2019-08-08 ENCOUNTER — Ambulatory Visit (INDEPENDENT_AMBULATORY_CARE_PROVIDER_SITE_OTHER): Payer: Medicaid Other | Admitting: Pharmacist

## 2019-08-08 DIAGNOSIS — Z794 Long term (current) use of insulin: Secondary | ICD-10-CM | POA: Diagnosis not present

## 2019-08-08 DIAGNOSIS — E1140 Type 2 diabetes mellitus with diabetic neuropathy, unspecified: Secondary | ICD-10-CM

## 2019-08-08 NOTE — Telephone Encounter (Signed)
R [facility] Dexcom was approved. Christen Bame, CMA

## 2019-08-08 NOTE — Progress Notes (Signed)
S:     Chief Complaint  Patient presents with  . Medication Management    Dexcom - CGM setup    Patient arrives in good spirits.  Ambulating without assistance.  Expressing excitement about the Dexcom G6 application.  Patient was referred and last seen by Primary Care Provider on 04/16/2019.  I contacted him to set up this device as it is covered by Medicaid.  He is an excellent candidate for success with this device due to his history of hypoglycemia requiring glucagon.  Patient denies taking >1 gram acetaminophen every 6 hours. Patient denies taking hydroxyrea.  Patient reports Diabetes was diagnosed post CCU hospitalization    Insurance coverage/medication affordability: Medicaid  Patient reports adherence with medications.  Current diabetes medications include: jardiance 78m, Victoza 1.846m Lantus 32 units and Fiasp 9-13 units SSI Current hypertension medications include: Carvedilol and Lisinopril Current hyperlipidemia medications include: Simvastatin 2067mPatient reports hypoglycemic events Intermittently.  Patient reported dietary habits: Eats 1-2 significant meals/day  Snacks:yogurt is common as "meal replacement" Drinks:water  Patient-reported exercise habits: limited   Patient reports neuropathy (nerve pain).  Reports dental procedure pain has resolved (tooth extraction)  Reports use of ~ 4 dose of nasal glucagon in the last 3-4 months.  He reports the nasal glucagon is a much better option than the [place] injection kits.     Dexcom G6 patient education provided to the patient only.  Instruction: Patient oriented to three components of Dexcom G6 continuous glucose monitor (sensor, transmitter, receiver/cellphone) I phone used as receiver.  -Dexcom G6 AND dexcom clarity app downloaded onto Iphone.  -Patient educated that Dexom G6 app must always be running (patient should not close out of app) -If using Dexcom G6 app, patient may share blood glucose data  with up to 10 followers on dexcom follow app. Dexcom G6 account email: [email or web address] Dexcom G6 account password: [email or web address] Sensor code: [id number] code: [id number] code: [id number] (expires 09/07/2019)  CGM overview and set-up  1. Button, touch screen, and icons 2. Power supply and recharging 3. Home screen 4. Date and time 5. Set BG target range: 100-250 6. Set alarm/alert tone   -Urgent low (automatic): yes -Urgent Low Soon: yes  -Low Glucose: yes  -High Glucose: yes  -Rise Rate: yes -Fall Rate: yes -No Readings Alert: yes 7. Interstitial vs. capillary blood glucose readings  8. When to verify sensor reading with fingerstick blood glucose 9. Blood glucose reading measured every five minutes. 10. Sensor will last 10 days 11. Transmitter will last 90 days and must be reused  12. Transmitter must be within 20 feet of receiver/cell phone.  Sensor application -- sensor placed on left abdomen 1. Site selection and site prep with alcohol pad 2. Sensor prep-sensor pack and sensor applicator 3. Sensor applied to area away from waistband, scarring, tattoos, irritation, and bones 4. Transmitter sanitized with alcohol pad and inserted into sensor. 5. Starting the sensor: 2 hour warm up before BG readings available 6. Sensor change every 10 days and rotate site 7. Call Dexcom customer service if sensor comes off before 10 days  Safety and Troubleshooting 1. Do a fingerstick blood glucose test if the sensor readings do not match how    you feel 2. Remove sensor prior to magnetic resonance imaging (MRI), computed tomography (CT) scan, or high-frequency electrical heat (diathermy) treatment. 3. Do not allow sun screen or insect repellant to come into contact with Dexcom G6. These skin care products may lead for the  plastic used in the Dexcom G6 to crack. 4. Dexcom G6 may be worn through a Environmental education officer. It may not be exposed to an advanced Imaging  Technology (AIT) body scanner (also called a millimeter wave scanner) or the baggage x-ray machine. Instead, ask for hand-wanding or full-body pat-down and visual inspection.  5. Doses of acetaminophen (Tylenol) >1 gram every 6 hours may cause false high readings. 6. Hydroxyurea (Hydrea, Droxia) may interfere with accuracy of blood glucose readings from Dexcom G6. 7. Store sensor kit between 36 and 86 degrees Farenheit. Can be refrigerated within this temperature range.  Contact information provided for [facility] customer service and/or trainer.  O:  Physical Exam Constitutional:      Appearance: Normal appearance. He is normal weight.  Pulmonary:     Effort: Pulmonary effort is normal.  Neurological:     Mental Status: He is alert.  Psychiatric:        Mood and Affect: Mood normal.        Behavior: Behavior normal.        Thought Content: Thought content normal.        Judgment: Judgment normal.    Review of Systems  Musculoskeletal: Positive for joint pain.       Right knee pending replacement    Lab Results  Component Value Date   HGBA1C 7.6 (A) 04/16/2019   There were no vitals filed for this visit.  Lipid Panel     Component Value Date/Time   CHOL 152 11/09/2017 1621   TRIG 230 (H) 11/09/2017 1621   HDL 42 11/09/2017 1621   CHOLHDL 3.6 11/09/2017 1621   CHOLHDL 3.9 11/02/2016 1551   VLDL 23 11/02/2016 1551   LDLCALC 64 11/09/2017 1621   LDLDIRECT 79 05/29/2009 2224    Home fasting blood sugars: ~ 200 2 hour post-meal/random blood sugars: ~ 200   Clinical Atherosclerotic Cardiovascular Disease (ASCVD): No  The 10-year ASCVD risk score Mikey Bussing DC Jr., et al., 2013) is: 17.5%   Values used to calculate the score:     Age: 58 years     Sex: Male     Is Non-Hispanic African American: No     Diabetic: Yes     Tobacco smoker: No     Systolic Blood Pressure: 811 mmHg     Is BP treated: Yes     HDL Cholesterol: 42 mg/dL     Total Cholesterol: 152 mg/dL    A/P:  Diabetes  Diabetes longstanding insulin requiring and fragile control including symptomatic lows requiring glucagon.  Patient is able to verbalize appropriate hypoglycemia management plan. Patient appears adherent with medication. Control is suboptimal due to fragile DM with need for symptomatic hypoglycemia management. Dexcom G6 continuous glucose monitor placed on patient's left abdominal flank successfully. -Continued basal insulin Lanuts (insulin glargine) at 32 units once daily.  -Continued  rapid insulin Fiasp (insulin aspart) at 9-13 units once daily.  -Continued GLP-1 Victoza (generic name liraglutide) at 1.25m.  -Continued SGLT2-I Jardiance (generic name empagliflozin) at 252mdaily.  -Counseled on s/sx of and management of hypoglycemia including use of Baqsimi -Next A1C anticipated 1-2 months.    Written patient instructions provided.  Total time in face to face counseling 60 minutes.   Follow up Pharmacy team via telephone in ~3 weeks.   Patient seen with MaDrexel [id number] Ambulatory Care pharmacy resident, and Dr. KoValentina Lucks

## 2019-08-08 NOTE — Assessment & Plan Note (Signed)
Diabetes  Diabetes longstanding insulin requiring and fragile control including symptomatic lows requiring glucagon.  Patient is able to verbalize appropriate hypoglycemia management plan. Patient appears adherent with medication. Control is suboptimal due to fragile DM with need for symptomatic hypoglycemia management. Dexcom G6 continuous glucose monitor placed on patient's left abdominal flank successfully. -Continued basal insulin Lanuts (insulin glargine) at 32 units once daily.  -Continued  rapid insulin Fiasp (insulin aspart) at 9-13 units once daily.  -Continued GLP-1 Victoza (generic name liraglutide) at 1.8mg .  -Continued SGLT2-I Jardiance (generic name empagliflozin) at 25mg  daily.  -Counseled on s/sx of and management of hypoglycemia including use of Baqsimi -Next A1C anticipated 1-2 months.

## 2019-08-08 NOTE — Patient Instructions (Addendum)
It was a pleasure seeing you in clinic today!  Today the plan is... 1.Continue diabetes medications as prescribed  Please call the PharmD clinic at [phone] if you have any questions that you would like to speak with a pharmacist about (Dr. Nicholas Lose or Dr. Drexel Iha).   Please remember... 1. Sensor will last 10 days 2. Transmitter will last 90 days and must be reused 3. Sensor should be applied to area away from waistband, scarring, tattoos, irritation, and bones. 4. Transmitter must be within 20 feet of receiver/cell phone. 5. If using Dexcom G6 app on cell phone, please remember to keep app open (do not close out of app). 6. Do a fingerstick blood glucose test if the sensor readings do not match how    you feel 7. Remove sensor prior to magnetic resonance imaging (MRI), computed tomography (CT) scan, or high-frequency electrical heat (diathermy) treatment. 8. Do not allow sun screen or insect repellant to come into contact with Dexcom G6. These skin care products may lead for the plastic used in the Dexcom G6 to crack. 9. Dexcom G6 may be worn through a Environmental education officer. It may not be exposed to an advanced Imaging Technology (AIT) body scanner (also called a millimeter wave scanner) or the baggage x-ray machine. Instead, ask for hand-wanding or full-body pat-down and visual inspection.  10. Doses of acetaminophen (Tylenol) >1 gram every 6 hours may cause false high readings. 11. Hydroxyurea (Hydrea, Droxia) may interfere with accuracy of blood glucose readings from Dexcom G6. 12. Store sensor kit between 36 and 86 degrees Farenheit. Can be refrigerated within this temperature range.   Dexcom Customer Service Information 1. Customer Sales Support (dexcom orders and general customer questions) Phone number: [phone] Monday - Friday  6 AM - 5 PM PST Saturday 8 AM - 4 PM PST   2. Global Technical Support (product troubleshooting or replacement inquiries) Phone  number: [phone] Available 24 hours a day; 7 days a week   3. Dexcom Care (provides dexcom CGM training, software downloads, and tutorials) Phone number: [phone] Monday - Friday 6 AM - 5 PM PST Saturday 7 AM - 1:30 PM PST (All hours subject to change)  4. Website: [email or web address]

## 2019-08-19 NOTE — Progress Notes (Signed)
Reviewed: I agree with Dr. Koval's documentation and management. 

## 2019-08-22 ENCOUNTER — Telehealth: Payer: Self-pay | Admitting: Pharmacist

## 2019-08-22 DIAGNOSIS — Z794 Long term (current) use of insulin: Secondary | ICD-10-CM

## 2019-08-22 DIAGNOSIS — E1140 Type 2 diabetes mellitus with diabetic neuropathy, unspecified: Secondary | ICD-10-CM

## 2019-08-22 NOTE — Telephone Encounter (Signed)
Called patient on 08/22/2019 at 11:02 AM   Dexcom G6 Report(08/09/2019 - 08/22/2019) Average Glucose: 165 Standard Deviation: 40 Glucose Management Indicator (GMI): 7.3  Time in Range: -Very high (>250 mg/dL): 3% -High (181-250 mg/dL): 32% -In range (70-180 mg/dL): 65% -Low (54-69 mg/dL): 0% -Very low (<54 mg/dL): 0%  Patient states he enjoys using Dexcom G6 CGM. He did not have any issues changing sensors and reusing transmitter. He states he has calibrated it a few times and glucose readings are +/- 4-5 mg/dL of manual BG meter. He likes how the Dexcom G6 CGM will alert him when he is trending low and how that prevents him from experiencing hypoglycemic episodes (reflected in Clarity report as well since he has had 0% low or very low episodes). He states he feels he does not have to eat before going to bed to prevent lows, which has brought him peace of mind. He states he has enjoyed learning about how his body processes food. Specifically, he has noticed how cereal can spike his BG readings and is now trying to stay away from cereal. He consistently eats yogurt, protein powder, vegetables, and fruit. He does not eat bread, rice, or meat. He typically eats at 12PM, 8PM, and 12AM. He goes to bed around 3-4AM. He has started logging carb intake on Dexcom G6 app.  Patient reports adherence to DM regimen - Lantus 32 units daily, Fiasp 10-18 units max daily (increase from previous appt - 9-13 units daily) Jardiance 25 mg daily, Victoza 1.8 mg daily, and metformin 500 mg BID. Patient has increased his Fiasp dosage; he will administers ~10 units with largest meal and ~4-5 units with smaller meals depending on what he eats. He has started logging his insulin dosages on his Dexcom G6 app. Patient stated he wanted to log Victoza, but there was not an option. Discussed with patient he can log his long-acting insulin at 32 units daily and then log it again afterwards as 1.8 units daily and I would know that  means Victoza.   Patient is also logging on Dexcom G6 app if he feels stressed/illness.  Encouraged patient for his success in DM management!!! Plan to continue current DM regimen. Will follow up with patient in 1-2 weeks to schedule an appt to review carb counting (preferably with Iver Nestle, PhD, RD, LDN).  Drexel Iha, PharmD PGY2 Ambulatory Care Pharmacy Resident

## 2019-08-29 ENCOUNTER — Encounter: Payer: Self-pay | Admitting: Family Medicine

## 2019-09-02 NOTE — Telephone Encounter (Signed)
Pt calling nurse line to follow up on status of getting rx for lyrica sent in again. Per patient, there was an issue with the certification number.   To PCP  Talbot Grumbling, RN

## 2019-09-11 ENCOUNTER — Other Ambulatory Visit: Payer: Self-pay | Admitting: Family Medicine

## 2019-09-11 DIAGNOSIS — G43719 Chronic migraine without aura, intractable, without status migrainosus: Secondary | ICD-10-CM | POA: Diagnosis not present

## 2019-09-11 DIAGNOSIS — M791 Myalgia, unspecified site: Secondary | ICD-10-CM | POA: Diagnosis not present

## 2019-09-11 DIAGNOSIS — M542 Cervicalgia: Secondary | ICD-10-CM | POA: Diagnosis not present

## 2019-09-11 DIAGNOSIS — G518 Other disorders of facial nerve: Secondary | ICD-10-CM | POA: Diagnosis not present

## 2019-09-17 ENCOUNTER — Other Ambulatory Visit: Payer: Self-pay

## 2019-09-17 ENCOUNTER — Ambulatory Visit (INDEPENDENT_AMBULATORY_CARE_PROVIDER_SITE_OTHER): Payer: Medicaid Other | Admitting: Family Medicine

## 2019-09-17 DIAGNOSIS — Z794 Long term (current) use of insulin: Secondary | ICD-10-CM | POA: Diagnosis not present

## 2019-09-17 DIAGNOSIS — E1140 Type 2 diabetes mellitus with diabetic neuropathy, unspecified: Secondary | ICD-10-CM | POA: Diagnosis not present

## 2019-09-17 NOTE — Addendum Note (Signed)
Addended by: Ellwood Handler on: 09/17/2019 01:11 PM   Modules accepted: Orders

## 2019-09-17 NOTE — Progress Notes (Signed)
Telehealth Encounter I connected with Patrick Elliott (MRN [id number]) on 09/17/2019 by MyChart video-enabled, HIPAA-compliant telemedicine application, verified that I am speaking with the correct person using two identifiers, and that the patient was in a private environment conducive to confidentiality.  The patient agreed to proceed.  Provider was Kennith Center, PhD, RD, LDN, CEDRD Provider(s) located at [facility] during this telehealth encounter; patient was at home  Appt start time: 1500 end time: 1600 (1 hour)  Reason for telehealth visit:Referred for Medical Nutrition Therapy for glycemic control.    Relevant history/background: Patrick Elliott started using a continuous glucose monitor about a month ago.  He needs to learn how to count carb's to determine meal coverage.  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  He received DM self-mgmt training (DSMT) from [facility] several years ago, but said he really did not fully understand a lot of it, and does not remember all that he had learned.    Assessment: Patrick Elliott lives alone, and does not do much in-home food preparation.  Often goes all day without eating when he is working on his art.  Relies heavily on bananas and nondairy yogurt, each 5 oz of which provides 19 g CHO and 1 g protein.  Yesterday's 10:30 PM snack, for example, provided close to 80 g CHO with little or no protein or fiber.   Usual eating pattern: 1 meals and 6 snacks per day. Frequent foods and beverages: water, soy milk, almond milk; tofu, fish, 3 bananas and 5-6 yogurts per day, 1/2 Eng muffin.   Avoided foods: dairy milk, pasta, sweets, sweet drinks.   Usual physical activity: none currently.  Waiting for a knee replacement.   Sleep: Estimates he gets 3-5 hrs/night.  Has trouble falling asleep; wakes up frequently.  Uses 5-10 mg Ambien sometimes.   24-hr recall:  (Up at 11:30 AM) B (11:45 AM)-  5 oz Oui  nondairy yogurt, 1 banana, water Snk ( AM)-  --- L ( PM)-  water Snk ( PM)-  --- D (8:30 PM)-  2 catfish soft tacos w/ avocado, salsa, cabbage, water  Snk (10:30)-  1 yogurt, 2 bananas Snk (2:30 AM) 5-oz yogurt Typical day? Yes.    Intervention: Completed diet and exercise history.  Provided information regarding dietary principles for BG management as well as specific recommendations.  Also provided basic information on insulin and carbohydrate and suggested resources.  Encouraged patient to see RD at [facility] (for which he will need a referral) for more detailed information on insulin dosing.    For recommendations, see Patient Instructions.    Follow-up: Per patient, he will follow up with [facility], or call me with Qs from today's visit.    Milea Klink,JEANNIE

## 2019-09-17 NOTE — Patient Instructions (Addendum)
Principles of blood glucose management:  - Distribute your food fairly evenly throughout the day.  A good pattern of eating is 3 meals and 1-3 snacks.   - Obtain meals and snacks that are nutritionally balanced, which means limited in carbohydrates.     Nutritional balance means that when you eat, you get a balance of protein, carb, and fat.    Carbohydrate Primer: Carbohydrate includes starch, sugar, and fiber.  Of these, only sugar and starch raise blood glucose.  (Fiber is found in fruits, vegetables [especially skin, seeds, and stalks], whole grains, and beans.)   Starchy (carb) foods: Bread, rice, pasta, potatoes, corn, cereal, grits, crackers, bagels, muffins, all baked goods.  (Fruit, milk, and yogurt also have carbohydrate, but most of these foods will not spike your blood sugar as most starchy foods will.)  A few fruits do cause high blood sugars; use small portions of bananas (limit to 1/2 at a time), grapes, watermelon, oranges, and most tropical fruits.   Protein foods: Meat, fish, poultry, eggs, dairy foods, and beans such as pinto and kidney beans (beans also provide carbohydrate).   Specific Recommendations:  1. Eat at least 3 REAL meals and 1-3 snacks per day.  Eat breakfast within one hour of getting up.  Aim for no more than 4-5 hours between eating.   A REAL meal includes at least some protein, some starch, and vegetables and/or fruit.   OR: Would you serve this to a guest in your house, and call it a meal?  2. Limit starchy foods to 1 to 2 portions per meal.  One portion is equal to:     - 1 piece of bread or its equivalent, e.g., 1/2 hamburger bun or 1/2 English muffin     - 1/2 c of scoopable carb's (rice, pasta, potatoes).      - 15 grams of Total Carbohydrate as shown on the food label.       - Most normal serving sizes of fruit also = 15 g carb  Strong recommendation: Experiment with some vegetable recipes.  For example, look up:  - How to roast vegetables    -  Vegetable soup*   - Stir-fry greens *If you use canned soup, first microwave fresh or frozen vegetables, then add soup, and reheat.    You may want to experiment with dairy yogurts, which will have good protein content instead of being almost all carb.  You can try mixing flavored (sweetened) yogurt with plain to dilute the sugar content, which will ultimately help you change your taste preferences as well.    Determining your fast-acting insulin dose:  Food is made up of 3 main macronutrients, protein, carb, and fat.  These 3 nutrients give [place] energy, which we know as calories.   Ideally your blood glucose (BG) will be no higher than 120 at mealtime.  If higher, you'll want to correct with insulin dosing.  Generally, 1 unit reduces BG by ~50 points.  You also want some fast acting insulin to cover the carb in the meal.  Generally, you can plan on 1/2 unit of insulin to cover ~10 grams of carb.  With your CGM, you will be able to adjust these calculations as needed.    You can get some good information at Diabetes.org and at [employer] websites.    Follow-up: If you would like to learn more details about this, I recommend you see someone at [facility].  (You will need a  referral for this.)

## 2019-09-19 DIAGNOSIS — F900 Attention-deficit hyperactivity disorder, predominantly inattentive type: Secondary | ICD-10-CM | POA: Diagnosis not present

## 2019-09-19 DIAGNOSIS — F411 Generalized anxiety disorder: Secondary | ICD-10-CM | POA: Diagnosis not present

## 2019-09-19 DIAGNOSIS — F3181 Bipolar II disorder: Secondary | ICD-10-CM | POA: Diagnosis not present

## 2019-09-29 ENCOUNTER — Other Ambulatory Visit: Payer: Self-pay | Admitting: Family Medicine

## 2019-09-29 DIAGNOSIS — Z794 Long term (current) use of insulin: Secondary | ICD-10-CM

## 2019-09-29 DIAGNOSIS — E1140 Type 2 diabetes mellitus with diabetic neuropathy, unspecified: Secondary | ICD-10-CM

## 2019-10-04 ENCOUNTER — Other Ambulatory Visit: Payer: Self-pay | Admitting: Family Medicine

## 2019-10-24 ENCOUNTER — Telehealth: Payer: Self-pay | Admitting: Pharmacist

## 2019-10-24 NOTE — Telephone Encounter (Signed)
Called patient on 10/24/2019 at 11:38 AM and left HIPAA-compliant VM with instructions to call [facility] back     [place] report. Patient's BG management appears to be doing very well. GMI was 7.5% - likely close estimate to what A1c will be. Plan to discuss continuing current DM medication management and following up in PharmD clinic as needed.   Thank you for involving pharmacy to assist in providing this patient's care.   Drexel Iha, PharmD PGY2 Ambulatory Care Pharmacy Resident

## 2019-10-30 ENCOUNTER — Telehealth: Payer: Self-pay | Admitting: *Deleted

## 2019-10-30 DIAGNOSIS — G518 Other disorders of facial nerve: Secondary | ICD-10-CM | POA: Diagnosis not present

## 2019-10-30 DIAGNOSIS — M791 Myalgia, unspecified site: Secondary | ICD-10-CM | POA: Diagnosis not present

## 2019-10-30 DIAGNOSIS — M542 Cervicalgia: Secondary | ICD-10-CM | POA: Diagnosis not present

## 2019-10-30 DIAGNOSIS — G43719 Chronic migraine without aura, intractable, without status migrainosus: Secondary | ICD-10-CM | POA: Diagnosis not present

## 2019-10-30 NOTE — Telephone Encounter (Signed)
Pt returns call to [facility].  He states he was instructed to call and checkin with [facility] Christen Bame, CMA

## 2019-11-05 ENCOUNTER — Other Ambulatory Visit: Payer: Self-pay | Admitting: Family Medicine

## 2019-11-11 ENCOUNTER — Other Ambulatory Visit: Payer: Self-pay | Admitting: Family Medicine

## 2019-11-11 DIAGNOSIS — E1140 Type 2 diabetes mellitus with diabetic neuropathy, unspecified: Secondary | ICD-10-CM

## 2019-12-06 ENCOUNTER — Encounter: Payer: Self-pay | Admitting: Family Medicine

## 2019-12-07 ENCOUNTER — Other Ambulatory Visit: Payer: Self-pay | Admitting: Family Medicine

## 2019-12-07 DIAGNOSIS — E118 Type 2 diabetes mellitus with unspecified complications: Secondary | ICD-10-CM

## 2019-12-11 ENCOUNTER — Other Ambulatory Visit: Payer: Self-pay

## 2019-12-11 DIAGNOSIS — G43719 Chronic migraine without aura, intractable, without status migrainosus: Secondary | ICD-10-CM | POA: Diagnosis not present

## 2019-12-11 DIAGNOSIS — M542 Cervicalgia: Secondary | ICD-10-CM | POA: Diagnosis not present

## 2019-12-11 DIAGNOSIS — G4700 Insomnia, unspecified: Secondary | ICD-10-CM

## 2019-12-11 DIAGNOSIS — E1140 Type 2 diabetes mellitus with diabetic neuropathy, unspecified: Secondary | ICD-10-CM

## 2019-12-11 DIAGNOSIS — G518 Other disorders of facial nerve: Secondary | ICD-10-CM | POA: Diagnosis not present

## 2019-12-11 DIAGNOSIS — M791 Myalgia, unspecified site: Secondary | ICD-10-CM | POA: Diagnosis not present

## 2019-12-14 DIAGNOSIS — F900 Attention-deficit hyperactivity disorder, predominantly inattentive type: Secondary | ICD-10-CM | POA: Diagnosis not present

## 2019-12-14 DIAGNOSIS — F3181 Bipolar II disorder: Secondary | ICD-10-CM | POA: Diagnosis not present

## 2019-12-14 DIAGNOSIS — F411 Generalized anxiety disorder: Secondary | ICD-10-CM | POA: Diagnosis not present

## 2019-12-19 ENCOUNTER — Ambulatory Visit: Payer: Medicaid Other

## 2019-12-20 ENCOUNTER — Ambulatory Visit: Payer: Medicaid Other | Attending: Internal Medicine

## 2019-12-20 DIAGNOSIS — Z23 Encounter for immunization: Secondary | ICD-10-CM

## 2019-12-20 NOTE — Progress Notes (Signed)
   Covid-19 Vaccination Clinic  Name:  Patrick Elliott    MRN: [id number] DOB: [birth year]  12/20/2019  Mr. Shake was observed post Covid-19 immunization for 15 minutes without incident. He was provided with Vaccine Information Sheet and instruction to access the V-Safe system.   [name] was instructed to call 911 with any severe reactions post vaccine: Marland Kitchen Difficulty breathing  . Swelling of face and throat  . A fast heartbeat  . A bad rash all over body  . Dizziness and weakness   Immunizations Administered    Name Date Dose VIS Date Route   Pfizer COVID-19 Vaccine 12/20/2019 11:52 AM 0.3 mL 10/16/2018 Intramuscular   Manufacturer: [place]   Lot: [id number]   [place]: [id number]

## 2019-12-24 MED ORDER — LISDEXAMFETAMINE DIMESYLATE 70 MG PO CAPS: 70.0000 mg | ORAL_CAPSULE | Freq: Every day | ORAL | 0 refills | Status: DC

## 2019-12-24 MED ORDER — ZOLPIDEM TARTRATE 10 MG PO TABS: 10.0000 mg | ORAL_TABLET | Freq: Every day | ORAL | 0 refills | Status: DC

## 2019-12-24 MED ORDER — METHYLPHENIDATE HCL 10 MG PO TABS: 10.0000 mg | ORAL_TABLET | Freq: Every day | ORAL | 0 refills | Status: DC

## 2019-12-24 MED ORDER — FIASP FLEXTOUCH 100 UNIT/ML ~~LOC~~ SOPN: 8.0000 [IU] | PEN_INJECTOR | Freq: Two times a day (BID) | SUBCUTANEOUS | 6 refills | Status: AC

## 2019-12-24 MED ORDER — LISINOPRIL 20 MG PO TABS: 20.0000 mg | ORAL_TABLET | Freq: Every day | ORAL | 3 refills | Status: DC

## 2019-12-25 ENCOUNTER — Telehealth: Payer: Self-pay

## 2019-12-25 NOTE — Telephone Encounter (Signed)
Prior approval for [employer] completed via [employer]. Med approved. Prior approval [id number]. [facility] informed.

## 2020-01-14 ENCOUNTER — Ambulatory Visit: Payer: Medicaid Other | Attending: Internal Medicine

## 2020-01-14 ENCOUNTER — Other Ambulatory Visit: Payer: Self-pay | Admitting: Family Medicine

## 2020-01-14 DIAGNOSIS — Z23 Encounter for immunization: Secondary | ICD-10-CM

## 2020-01-14 NOTE — Progress Notes (Signed)
   Covid-19 Vaccination Clinic  Name:  Patrick Elliott    MRN: [id number] DOB: 07-09-1961  01/14/2020  [name] was observed post Covid-19 immunization for 15 minutes without incident. He was provided with Vaccine Information Sheet and instruction to access the V-Safe system.   [name] was instructed to call 911 with any severe reactions post vaccine: Marland Kitchen Difficulty breathing  . Swelling of face and throat  . A fast heartbeat  . A bad rash all over body  . Dizziness and weakness   Immunizations Administered    Name Date Dose VIS Date Route   Pfizer COVID-19 Vaccine 01/14/2020  2:30 PM 0.3 mL 10/16/2018 Intramuscular   Manufacturer: [place]   Lot: [id number]   [place]: [id number]

## 2020-01-23 ENCOUNTER — Other Ambulatory Visit: Payer: Self-pay | Admitting: Family Medicine

## 2020-01-23 DIAGNOSIS — E118 Type 2 diabetes mellitus with unspecified complications: Secondary | ICD-10-CM

## 2020-01-29 ENCOUNTER — Ambulatory Visit: Payer: Medicaid Other | Admitting: Family Medicine

## 2020-01-29 NOTE — Progress Notes (Deleted)
    SUBJECTIVE:   CHIEF COMPLAINT / HPI:   Patrick Elliott is a 59 yr old male who presents for follow up of lesion on face  Lesion on face   DM Last A1c 7.6 9 months ago. Pt currently Eye doctor? Foot exam    Hypercholesterolemia Lipid panel in 2019. Currently takes Simvastatin 20mg . Myalgias, RUQ pain   Health Maintenance Colonoscopy   Ask pt to follow up in July   PERTINENT  PMH / PSH: ***  OBJECTIVE:   There were no vitals taken for this visit.  ***  ASSESSMENT/PLAN:   No problem-specific Assessment & Plan notes found for this encounter.     Lattie Haw, MD [place]

## 2020-01-30 ENCOUNTER — Other Ambulatory Visit: Payer: Self-pay | Admitting: Family Medicine

## 2020-01-30 DIAGNOSIS — Z794 Long term (current) use of insulin: Secondary | ICD-10-CM

## 2020-02-10 ENCOUNTER — Other Ambulatory Visit: Payer: Self-pay

## 2020-02-11 MED ORDER — DEXCOM G6 SENSOR MISC: 1.0000 | 11 refills | Status: DC

## 2020-02-13 ENCOUNTER — Telehealth: Payer: Self-pay | Admitting: Pharmacist

## 2020-02-13 ENCOUNTER — Telehealth: Payer: Self-pay

## 2020-02-13 NOTE — Telephone Encounter (Signed)
Patient calls nurse line stating his pharmacy is still waiting on insurance approval for Dexcom 6. I have not received anything about this. I do have the generic medicaid form that I have filled out and placed in providers box for review. Please have an attending sign and return back to me so I can make a copy.

## 2020-02-13 NOTE — Telephone Encounter (Signed)
Patient in need of re-prior authorization for Dexcom G6 CGM Performed by Jim Desanctis, [place]

## 2020-02-13 NOTE — Telephone Encounter (Signed)
Koval filled out and [employer]. PA faxed to Medicaid. Copy in green folder in nurse room.

## 2020-02-17 ENCOUNTER — Encounter: Payer: Self-pay | Admitting: Family Medicine

## 2020-02-17 DIAGNOSIS — E1140 Type 2 diabetes mellitus with diabetic neuropathy, unspecified: Secondary | ICD-10-CM

## 2020-02-17 NOTE — Telephone Encounter (Signed)
Just wondering if you were able to find out about the prior approval form for Mr Hanna.  Thanks Carollee Leitz, MD [facility]

## 2020-02-18 MED ORDER — ACCU-CHEK FASTCLIX LANCETS MISC: 6 refills | Status: DC

## 2020-02-18 NOTE — Telephone Encounter (Signed)
I contacted Medicaid to check on approval status for Dexcom. Per representative, in order for PA to be approved we need to provide "proof" this device is working for him. Representative stated any labs or office notes would suffice. We do not have an updated a1c, as patient missed his recent apt, however he is scheduled for 7/16 for DM FU. I was able to find documentation from [employer] [facility] that I think might work. I have printed all documentation and refaxed to Medicaid. Will check the status tomorrow.

## 2020-02-18 NOTE — Telephone Encounter (Signed)
Patient calls with report that his CGM sensor reapproval has not yet been granted.   He is inquiring to request resubmission of fax to Medicaid.  It was refaxed 6/28 per CMA, Jim Desanctis, who will contact Medicaid by phone today.   He also requested new prescription for Accu-chek fast click "drums"

## 2020-02-20 NOTE — Telephone Encounter (Signed)
I called medicaid to check dexcom status. Per representative, all she had was from earlier in the week. She stated she could not see anything in the system from 6/29, the day I refaxed PA with supporting notes. I will refax this again.

## 2020-02-21 NOTE — Telephone Encounter (Signed)
PA resubmitted to healthy blue via covermymeds.  [employer]ches  Case ID #: [id number]  Will await 48-72 hours for response.

## 2020-02-21 NOTE — Telephone Encounter (Signed)
Contacted patient with PA updates. Patient was able to give me new insurance information in hopes to resubmit PA to new insurance plan effectively. Patient informed Valentina Lucks has left samples of sensors up front to get him through holiday weekend. Patient very appreciative of our help and is hopeful he can find a ride up here today.

## 2020-02-21 NOTE — Telephone Encounter (Signed)
Discussed Medicaid Insurance change issues with Jim Desanctis, CMA.   I agreed to place a sample sensor for this patient at front desk for pick-up.  I am happy to assist as needed if therapy is a documentation support issue identified for this patient to get Medicaid coverage of his CGM.

## 2020-02-25 NOTE — Telephone Encounter (Signed)
PA was denied via new [employer]. PCP should get a letter explaining why and ways to appeal.

## 2020-02-26 NOTE — Telephone Encounter (Signed)
Noted, I will look out for further details of how to appeal. Thank you.

## 2020-03-06 ENCOUNTER — Encounter: Payer: Self-pay | Admitting: Family Medicine

## 2020-03-06 ENCOUNTER — Ambulatory Visit (INDEPENDENT_AMBULATORY_CARE_PROVIDER_SITE_OTHER): Payer: Medicaid Other | Admitting: Family Medicine

## 2020-03-06 ENCOUNTER — Other Ambulatory Visit: Payer: Self-pay

## 2020-03-06 VITALS — BP 120/80 | HR 95 | Ht 67.0 in | Wt 186.4 lb

## 2020-03-06 DIAGNOSIS — L821 Other seborrheic keratosis: Secondary | ICD-10-CM | POA: Diagnosis not present

## 2020-03-06 DIAGNOSIS — Z794 Long term (current) use of insulin: Secondary | ICD-10-CM

## 2020-03-06 DIAGNOSIS — E1140 Type 2 diabetes mellitus with diabetic neuropathy, unspecified: Secondary | ICD-10-CM | POA: Diagnosis not present

## 2020-03-06 LAB — POCT GLYCOSYLATED HEMOGLOBIN (HGB A1C): HbA1c, POC (controlled diabetic range): 7.3 % — AB (ref 0.0–7.0)

## 2020-03-06 NOTE — Patient Instructions (Signed)
Seborrheic Keratosis °A seborrheic keratosis is a common, noncancerous (benign) skin growth. These growths are velvety, waxy, rough, tan, brown, or black spots that appear on the skin. These skin growths can be flat or raised, and scaly. °What are the causes? °The cause of this condition is not known. °What increases the risk? °You are more likely to develop this condition if you: °· Have a family history of seborrheic keratosis. °· Are 50 or older. °· Are pregnant. °· Have had estrogen replacement therapy. °What are the signs or symptoms? °Symptoms of this condition include growths on the face, chest, shoulders, back, or other areas. These growths: °· Are usually painless, but may become irritated and itchy. °· Can be yellow, brown, black, or other colors. °· Are slightly raised or have a flat surface. °· Are sometimes rough or wart-like in texture. °· Are often velvety or waxy on the surface. °· Are round or oval-shaped. °· Often occur in groups, but may occur as a single growth. °How is this diagnosed? °This condition is diagnosed with a medical history and physical exam. °· A sample of the growth may be tested (skin biopsy). °· You may need to see a skin specialist (dermatologist). °How is this treated? °Treatment is not usually needed for this condition, unless the growths are irritated or bleed often. °· You may also choose to have the growths removed if you do not like their appearance. °? Most commonly, these growths are treated with a procedure in which liquid nitrogen is applied to "freeze" off the growth (cryosurgery). °? They may also be burned off with electricity (electrocautery) or removed by scraping (curettage). °Follow these instructions at home: °· Watch your growth for any changes. °· Keep all follow-up visits as told by your health care provider. This is important. °· Do not scratch or pick at the growth or growths. This can cause them to become irritated or infected. °Contact a health care  provider if: °· You suddenly have many new growths. °· Your growth bleeds, itches, or hurts. °· Your growth suddenly becomes larger or changes color. °Summary °· A seborrheic keratosis is a common, noncancerous (benign) skin growth. °· Treatment is not usually needed for this condition, unless the growths are irritated or bleed often. °· Watch your growth for any changes. °· Contact a health care provider if you suddenly have many new growths or your growth suddenly becomes larger or changes color. °· Keep all follow-up visits as told by your health care provider. This is important. °This information is not intended to replace advice given to you by your health care provider. Make sure you discuss any questions you have with your health care provider. °Document Revised: 12/21/2017 Document Reviewed: 12/21/2017 °Elsevier Patient Education © 2020 Elsevier Inc. ° °

## 2020-03-07 DIAGNOSIS — L821 Other seborrheic keratosis: Secondary | ICD-10-CM | POA: Insufficient documentation

## 2020-03-07 LAB — BASIC METABOLIC PANEL
BUN/Creatinine Ratio: 9 — ABNORMAL LOW (ref 10–24)
BUN: 9 mg/dL (ref 8–27)
CO2: 21 mmol/L (ref 20–29)
Calcium: 9.3 mg/dL (ref 8.6–10.2)
Chloride: 99 mmol/L (ref 96–106)
Creatinine, Ser: 1.02 mg/dL (ref 0.76–1.27)
GFR calc Af Amer: 92 mL/min/{1.73_m2} (ref >59)
GFR calc non Af Amer: 80 mL/min/{1.73_m2} (ref >59)
Glucose: 344 mg/dL — ABNORMAL HIGH (ref 65–99)
Potassium: 4.8 mmol/L (ref 3.5–5.2)
Sodium: 136 mmol/L (ref 134–144)

## 2020-03-07 LAB — LIPID PANEL
Chol/HDL Ratio: 3.8 ratio (ref 0.0–5.0)
Cholesterol, Total: 168 mg/dL (ref 100–199)
HDL: 44 mg/dL (ref >39)
LDL Chol Calc (NIH): 97 mg/dL (ref 0–99)
Triglycerides: 154 mg/dL — ABNORMAL HIGH (ref 0–149)
VLDL Cholesterol Cal: 27 mg/dL (ref 5–40)

## 2020-03-07 NOTE — Assessment & Plan Note (Signed)
Likely seborrheic keratosis, reassured patient that it is a benign growth and may have it removed if he would like. Pt would like it removed for aesthetic purposes. Booked appointment in derm clinic on 03/26/20 for removal. Provided handout on seborrheic keratosis.

## 2020-03-07 NOTE — Progress Notes (Signed)
° ° °  SUBJECTIVE:   CHIEF COMPLAINT / HPI:   Patrick Elliott is a 59 yr old male who presents today for concerns for face lesion  Face lesion Pt noticed lesion on right side of face which was initially a pimple. Over the last several months it has grown in size, become a grey/brown color, asymmetrical and changed in texture.  It becomes darker in the sun. Denies other lesions like this.  Diabetes Takes Metformin 1g BID, Jardiance 25 mg once daily, Lantus 36 units, Victoza 1.8mg  daily, Sliding scale short acting insulin (pt does not know the name). Tolerating all meds well without side effects. Uses dexcom which he likes and helps him track his CBGs. CBGs range from 150-160, lowest 90, highest 225. Experiences hypoglycemic symptoms <125.  PERTINENT  PMH / PSH: Diabetes, HLD, peripheral neuropathy   OBJECTIVE:   BP 120/80    Pulse 95    Ht 5\' 7"  (1.702 m)    Wt 186 lb 6.4 oz (84.6 kg)    SpO2 99%    BMI 29.19 kg/m     1cm grey papular lesion on right cheek. Irregular border, rough texture. Non tender on palpation.  ASSESSMENT/PLAN:   Seborrheic keratosis Likely seborrheic keratosis, reassured patient that it is a benign growth and may have it removed if he would like. Pt would like it removed for aesthetic purposes. Booked appointment in derm clinic on 03/26/20 for removal. Provided handout on seborrheic keratosis.  Diabetes mellitus with neuropathy (HCC) A1c checked today. Will call patient with the results and adjust diabetic medications if required. Explained that the goal to reduce his insulin to as low as possible and eventually wean him off. Continue to use Dexcom for blood glucose monitoring.     Lattie Haw, MD [place]

## 2020-03-07 NOTE — Assessment & Plan Note (Signed)
A1c checked today. Will call patient with the results and adjust diabetic medications if required. Explained that the goal to reduce his insulin to as low as possible and eventually wean him off. Continue to use Dexcom for blood glucose monitoring.

## 2020-03-09 ENCOUNTER — Telehealth: Payer: Self-pay | Admitting: Family Medicine

## 2020-03-09 ENCOUNTER — Telehealth: Payer: Self-pay | Admitting: Pharmacist

## 2020-03-09 NOTE — Telephone Encounter (Signed)
Noted and agree. 

## 2020-03-09 NOTE — Telephone Encounter (Signed)
Called patient to inform him of lab results.

## 2020-03-09 NOTE — Telephone Encounter (Signed)
Patient called my direct line multiple times last week 7/12, 7/14, 7/15 while I was out of the week.   He did come to the office, get an A1C and pick up a sensor.   Today 7/19, we completed [employer] form for prescription coverage of CGM testing for this patient.  Included details on symptomatic hypoglycemia requiring glucagon administration in the past.   Form forwarded to Dr. Andria Frames and then sent for submission with additional documentation (previous notes and improved A1c documentation.

## 2020-03-09 NOTE — Telephone Encounter (Signed)
Noted and agreed. Thank you Koval. His A1c is 7.3 improved from previously so I have asked him to continue the same diabetic regime and will follow up with him in 3 months time.

## 2020-03-10 NOTE — Telephone Encounter (Signed)
PA with recent office visits notes faxed to [facility]. The original fax is at my desk in nurse room-in PA purple folder.

## 2020-03-11 ENCOUNTER — Other Ambulatory Visit: Payer: Self-pay | Admitting: *Deleted

## 2020-03-11 NOTE — Telephone Encounter (Signed)
Contacted pharmacy to check to see if there are refills on the Rx that was sent in Dec. 2020.  He said there are refills however the Rx was only good for 6 months so they need a new Rx for this because they can no longer refill on the other RX.Kaycee Haycraft Zimmerman Rumple, CMA

## 2020-03-12 ENCOUNTER — Encounter: Payer: Self-pay | Admitting: Family Medicine

## 2020-03-12 MED ORDER — PREGABALIN 100 MG PO CAPS: 100.0000 mg | ORAL_CAPSULE | Freq: Three times a day (TID) | ORAL | 3 refills | Status: DC

## 2020-03-18 DIAGNOSIS — F3181 Bipolar II disorder: Secondary | ICD-10-CM | POA: Diagnosis not present

## 2020-03-18 DIAGNOSIS — F411 Generalized anxiety disorder: Secondary | ICD-10-CM | POA: Diagnosis not present

## 2020-03-18 DIAGNOSIS — F900 Attention-deficit hyperactivity disorder, predominantly inattentive type: Secondary | ICD-10-CM | POA: Diagnosis not present

## 2020-03-24 ENCOUNTER — Other Ambulatory Visit: Payer: Self-pay | Admitting: Family Medicine

## 2020-03-26 ENCOUNTER — Ambulatory Visit (INDEPENDENT_AMBULATORY_CARE_PROVIDER_SITE_OTHER): Payer: Medicaid Other | Admitting: Family Medicine

## 2020-03-26 ENCOUNTER — Other Ambulatory Visit: Payer: Self-pay

## 2020-03-26 DIAGNOSIS — L821 Other seborrheic keratosis: Secondary | ICD-10-CM

## 2020-03-26 DIAGNOSIS — G43719 Chronic migraine without aura, intractable, without status migrainosus: Secondary | ICD-10-CM | POA: Diagnosis not present

## 2020-03-26 DIAGNOSIS — G518 Other disorders of facial nerve: Secondary | ICD-10-CM | POA: Diagnosis not present

## 2020-03-26 DIAGNOSIS — M542 Cervicalgia: Secondary | ICD-10-CM | POA: Diagnosis not present

## 2020-03-26 DIAGNOSIS — M791 Myalgia, unspecified site: Secondary | ICD-10-CM | POA: Diagnosis not present

## 2020-03-26 NOTE — Patient Instructions (Signed)
Seborrheic Keratosis °A seborrheic keratosis is a common, noncancerous (benign) skin growth. These growths are velvety, waxy, rough, tan, brown, or black spots that appear on the skin. These skin growths can be flat or raised, and scaly. °What are the causes? °The cause of this condition is not known. °What increases the risk? °You are more likely to develop this condition if you: °· Have a family history of seborrheic keratosis. °· Are 50 or older. °· Are pregnant. °· Have had estrogen replacement therapy. °What are the signs or symptoms? °Symptoms of this condition include growths on the face, chest, shoulders, back, or other areas. These growths: °· Are usually painless, but may become irritated and itchy. °· Can be yellow, brown, black, or other colors. °· Are slightly raised or have a flat surface. °· Are sometimes rough or wart-like in texture. °· Are often velvety or waxy on the surface. °· Are round or oval-shaped. °· Often occur in groups, but may occur as a single growth. °How is this diagnosed? °This condition is diagnosed with a medical history and physical exam. °· A sample of the growth may be tested (skin biopsy). °· You may need to see a skin specialist (dermatologist). °How is this treated? °Treatment is not usually needed for this condition, unless the growths are irritated or bleed often. °· You may also choose to have the growths removed if you do not like their appearance. °? Most commonly, these growths are treated with a procedure in which liquid nitrogen is applied to "freeze" off the growth (cryosurgery). °? They may also be burned off with electricity (electrocautery) or removed by scraping (curettage). °Follow these instructions at home: °· Watch your growth for any changes. °· Keep all follow-up visits as told by your health care provider. This is important. °· Do not scratch or pick at the growth or growths. This can cause them to become irritated or infected. °Contact a health care  provider if: °· You suddenly have many new growths. °· Your growth bleeds, itches, or hurts. °· Your growth suddenly becomes larger or changes color. °Summary °· A seborrheic keratosis is a common, noncancerous (benign) skin growth. °· Treatment is not usually needed for this condition, unless the growths are irritated or bleed often. °· Watch your growth for any changes. °· Contact a health care provider if you suddenly have many new growths or your growth suddenly becomes larger or changes color. °· Keep all follow-up visits as told by your health care provider. This is important. °This information is not intended to replace advice given to you by your health care provider. Make sure you discuss any questions you have with your health care provider. °Document Revised: 12/21/2017 Document Reviewed: 12/21/2017 °Elsevier Patient Education © 2020 Elsevier Inc. ° °

## 2020-03-29 NOTE — Progress Notes (Addendum)
    SUBJECTIVE:   CHIEF COMPLAINT / HPI:   Patrick Elliott is a 59 yr old male who presents today for seborrheic keratosis cryotherapy  Seborrheic keratosis Seen in [facility] clinic a few weeks ago for seborrheic keratosis on right cheek, it was troubling him aesthetically so he scheduled to get it removed in derm clinic today. However prior to clinic he picked on it and it fell off. It has left a small scar on the cheek.  PERTINENT  PMH / PSH: DM, HTN , peripheral neuropathy  OBJECTIVE:   BP 125/80   Pulse 84   Ht 5\' 7"  (1.702 m)   Wt 187 lb 12.8 oz (85.2 kg)   SpO2 98%   BMI 29.41 kg/m    General: Alert and cooperative and appears to be in no acute distress  HEENT: NCAT, small grey/brown macule over right cheek at site of previous SK, no obvious skin lesions  Cardio: well perfused  Pulm:Normal respiratory effort Neuro: Cranial nerves grossly intact  ASSESSMENT/PLAN:   Seborrheic keratosis Performed cryotherapy on area of SK site over right cheek. Pt tolerated well, no complications. Reassured pt that SKs are benign growths and do not need to be removed unless causing sx such as itching or aesthetically unpleasing.     Lattie Haw, MD [place]    I was present during history, physical, and procedure.    We used cryotherapy to R cheek SK - freeze-thaw x3

## 2020-03-29 NOTE — Assessment & Plan Note (Addendum)
Performed cryotherapy on area of SK site over right cheek. Pt tolerated well, no complications. Reassured pt that SKs are benign growths and do not need to be removed unless causing sx such as itching or aesthetically unpleasing.

## 2020-04-13 ENCOUNTER — Other Ambulatory Visit: Payer: Self-pay | Admitting: Family Medicine

## 2020-04-16 ENCOUNTER — Telehealth: Payer: Self-pay | Admitting: Pharmacist

## 2020-04-16 DIAGNOSIS — Z794 Long term (current) use of insulin: Secondary | ICD-10-CM

## 2020-04-16 DIAGNOSIS — E118 Type 2 diabetes mellitus with unspecified complications: Secondary | ICD-10-CM

## 2020-04-16 MED ORDER — LANTUS SOLOSTAR 100 UNIT/ML ~~LOC~~ SOPN: 36.0000 [IU] | PEN_INJECTOR | Freq: Every day | SUBCUTANEOUS | Status: DC

## 2020-04-16 MED ORDER — FIASP FLEXTOUCH 100 UNIT/ML ~~LOC~~ SOPN: 8.0000 [IU] | PEN_INJECTOR | Freq: Two times a day (BID) | SUBCUTANEOUS | Status: DC

## 2020-04-16 NOTE — Telephone Encounter (Signed)
Called patient to follow up on diabetes management. Reviewed Dexcom data--average blood glucose reading was 222 mg/dL.   He is currently taking Lantus 36 units daily and Fiasp 6 units twice daily with his bigger meals and 3 units with snacks. He reports that his blood sugars has been higher lately which he attributes to not exercising as much lately due to the heat and eating more fruits than usual. He hasn't had any lows which he says the Dexcom is very helpful to prevent dropping too low.   Will adjust insulin regimen today to help obtain better blood glucose control: - Continue Lantus 36 units daily - Increase Fiasp to 8-10 units with meals (use 10 units when pre-meal blood glucose >250). Increase Fiasp to 4 units with snacks.   Will follow up with patient by phone in 3 weeks.

## 2020-04-16 NOTE — Assessment & Plan Note (Signed)
Will adjust insulin regimen today to help obtain better blood glucose control: - Continue Lantus 36 units daily - Increase Fiasp to 8-10 units with meals (use 10 units when pre-meal blood glucose >250). Increase Fiasp to 4 units with snacks.

## 2020-04-17 NOTE — Telephone Encounter (Signed)
Reviewed and agree.

## 2020-04-20 NOTE — Telephone Encounter (Signed)
Noted and agreed, thank you Dr Valentina Lucks.

## 2020-04-23 ENCOUNTER — Other Ambulatory Visit: Payer: Self-pay | Admitting: Family Medicine

## 2020-04-23 DIAGNOSIS — Z794 Long term (current) use of insulin: Secondary | ICD-10-CM

## 2020-05-14 DIAGNOSIS — M542 Cervicalgia: Secondary | ICD-10-CM | POA: Diagnosis not present

## 2020-05-14 DIAGNOSIS — M791 Myalgia, unspecified site: Secondary | ICD-10-CM | POA: Diagnosis not present

## 2020-05-14 DIAGNOSIS — G518 Other disorders of facial nerve: Secondary | ICD-10-CM | POA: Diagnosis not present

## 2020-05-14 DIAGNOSIS — G43719 Chronic migraine without aura, intractable, without status migrainosus: Secondary | ICD-10-CM | POA: Diagnosis not present

## 2020-05-19 ENCOUNTER — Telehealth: Payer: Self-pay

## 2020-05-19 NOTE — Telephone Encounter (Signed)
Patient calls nurse line stating that he was returning phone call to Dr. Valentina Lucks. I am unable to find telephone note as to reason for patient contact.   Please advise if you attempted to reach this patient. Patient may be reached at [phone]  To Dr. Pervis Hocking, RN

## 2020-05-22 NOTE — Telephone Encounter (Signed)
Attempted to contact multiple times 9/30 and 10/1  Left message on cell phone voice mail requesting call back with direct phone contact provided.

## 2020-06-01 ENCOUNTER — Telehealth: Payer: Self-pay | Admitting: Pharmacist

## 2020-06-01 NOTE — Telephone Encounter (Signed)
Noted and agree. 

## 2020-06-01 NOTE — Telephone Encounter (Signed)
After calling patients multiple times and requesting call back over the last few weeks, patient returned call today.   Patient stated that his blood sugar control has been suboptimal over the last several weeks.  He shared that he has not been eating correctly due to a terminal illness in his girlfriends father which resulted in his deatlh last week.   He has been psychologically stressed by participating in this process.   His CGM appears to have provided a decrease in severe low readings with alerts making him aware of dropping blood sugars before he gets any symptoms.  He was thankful for the CGM.    He acknowledges that he needs to focus on improving blood sugar control to achieve A1C level that will permit him to schedule his elective knee surgery.  He was verbally committed to improving control.   He has increased his Fiasp (insulin aspart) from 8 to 10 more consistently prior to meals.  He continues to take Lantus 36 units daily.  We agreed to make no changes to his insulin regimen today.  He will work harder on his diet.  We agreed to talk via the phone to adjust further in 2-3 weeks.    More discussion on blood sugar control at that time.   I shared good wishes and offered support from our office as he and his extended family process their recent loss.

## 2020-06-02 NOTE — Telephone Encounter (Signed)
Noted and agreed thank you Dr Koval. 

## 2020-06-24 ENCOUNTER — Telehealth: Payer: Self-pay | Admitting: Pharmacist

## 2020-06-24 NOTE — Telephone Encounter (Signed)
Patient contact RE Diabetes - no answer Left HIPAA compliant message requesting call back.

## 2020-07-02 ENCOUNTER — Telehealth: Payer: Self-pay | Admitting: Pharmacist

## 2020-07-02 ENCOUNTER — Other Ambulatory Visit: Payer: Self-pay | Admitting: Family Medicine

## 2020-07-02 DIAGNOSIS — E1140 Type 2 diabetes mellitus with diabetic neuropathy, unspecified: Secondary | ICD-10-CM

## 2020-07-02 MED ORDER — GVOKE HYPOPEN 2-PACK 0.5 MG/0.1ML ~~LOC~~ SOAJ: 0.5000 mg | SUBCUTANEOUS | 5 refills | Status: DC | PRN

## 2020-07-02 NOTE — Telephone Encounter (Signed)
Patient returned call after multiple attempts to reach him over the last several weeks.   He states that he has been doing poorly lately including sleeping more and having low energy.  He states he feels like his mood and energy level have improved over the last 5 days.    He admits that his blood sugar control has been erratic and not well controlled.  He states he has had to use the glucagon nasal spray ~ 3 times.  We discussed symptoms with use and also discussed glucagon autoinjector.  He requested a new prescription for this as an alternative to the glucagon nasal spray.   He continues to find the CGM highly useflu but realizes he could do better.   We agreed to make no medication changes as he is prioritizing lifestyle changes, "gettingback to the things he needs to do" as his priority at this time.  Plan to call an talk with him in ~ 3 weeks (early December) to assess control, goals and potentially insulin/diabetes medication adjustment.Marland Kitchen

## 2020-07-02 NOTE — Assessment & Plan Note (Signed)
Blood sugar control has been erratic and not well controlled.  He states he has had to use the glucagon nasal spray ~ 3 times.  We discussed symptoms with use and also discussed glucagon autoinjector.  He requested a new prescription for this as an alternative to the glucagon nasal spray.   He continues to find the CGM highly useflu but realizes he could do better.   We agreed to make no medication changes as he is prioritizing lifestyle changes, "gettingback to the things he needs to do" as his priority at this time.  Plan to call an talk with him in ~ 3 weeks (early December) to assess control, goals and potentially insulin/diabetes medication adjustment.Marland Kitchen

## 2020-07-03 NOTE — Telephone Encounter (Signed)
Noted and agreed, thanks Dr Koval.  

## 2020-07-03 NOTE — Telephone Encounter (Signed)
Noted and agree. 

## 2020-07-13 ENCOUNTER — Other Ambulatory Visit: Payer: Self-pay | Admitting: Family Medicine

## 2020-07-13 DIAGNOSIS — E118 Type 2 diabetes mellitus with unspecified complications: Secondary | ICD-10-CM

## 2020-07-13 DIAGNOSIS — G43719 Chronic migraine without aura, intractable, without status migrainosus: Secondary | ICD-10-CM | POA: Diagnosis not present

## 2020-07-13 DIAGNOSIS — E1140 Type 2 diabetes mellitus with diabetic neuropathy, unspecified: Secondary | ICD-10-CM

## 2020-07-13 DIAGNOSIS — G518 Other disorders of facial nerve: Secondary | ICD-10-CM | POA: Diagnosis not present

## 2020-07-13 DIAGNOSIS — M542 Cervicalgia: Secondary | ICD-10-CM | POA: Diagnosis not present

## 2020-07-13 DIAGNOSIS — Z794 Long term (current) use of insulin: Secondary | ICD-10-CM

## 2020-07-13 DIAGNOSIS — M791 Myalgia, unspecified site: Secondary | ICD-10-CM | POA: Diagnosis not present

## 2020-07-20 ENCOUNTER — Other Ambulatory Visit: Payer: Self-pay | Admitting: Family Medicine

## 2020-07-20 DIAGNOSIS — E785 Hyperlipidemia, unspecified: Secondary | ICD-10-CM

## 2020-07-28 ENCOUNTER — Other Ambulatory Visit: Payer: Self-pay | Admitting: Family Medicine

## 2020-07-28 DIAGNOSIS — E118 Type 2 diabetes mellitus with unspecified complications: Secondary | ICD-10-CM

## 2020-07-28 DIAGNOSIS — Z794 Long term (current) use of insulin: Secondary | ICD-10-CM

## 2020-07-30 ENCOUNTER — Telehealth: Payer: Self-pay | Admitting: Pharmacist

## 2020-07-30 NOTE — Telephone Encounter (Signed)
I reviewed CGM report and attempted phone call to discuss current control.   Left message requesting call back - provided by direct line.   If I do not hear from patient I will try again next week.

## 2020-07-30 NOTE — Telephone Encounter (Signed)
Noted and agree. 

## 2020-08-03 NOTE — Telephone Encounter (Signed)
Noted and agree. 

## 2020-08-03 NOTE — Telephone Encounter (Signed)
Patient returned call and shared that his blood sugars remain suboptimally controlled.  He was willing to schedule a visit to focus on blood sugar control in January with the pharmacy team.    Stress and pain (knee) are likely reasons for persistently elevated blood sugars.   I look forward to seeing him in-person in a few weeks.  Until that time, he was encouraged to focus on health eating.  No changes were made to his diabetes medications over the phone.

## 2020-08-07 DIAGNOSIS — F411 Generalized anxiety disorder: Secondary | ICD-10-CM | POA: Diagnosis not present

## 2020-08-07 DIAGNOSIS — F3181 Bipolar II disorder: Secondary | ICD-10-CM | POA: Diagnosis not present

## 2020-08-07 DIAGNOSIS — F900 Attention-deficit hyperactivity disorder, predominantly inattentive type: Secondary | ICD-10-CM | POA: Diagnosis not present

## 2020-08-13 DIAGNOSIS — M542 Cervicalgia: Secondary | ICD-10-CM | POA: Diagnosis not present

## 2020-08-13 DIAGNOSIS — M791 Myalgia, unspecified site: Secondary | ICD-10-CM | POA: Diagnosis not present

## 2020-08-13 DIAGNOSIS — G518 Other disorders of facial nerve: Secondary | ICD-10-CM | POA: Diagnosis not present

## 2020-08-13 DIAGNOSIS — G43719 Chronic migraine without aura, intractable, without status migrainosus: Secondary | ICD-10-CM | POA: Diagnosis not present

## 2020-08-27 ENCOUNTER — Other Ambulatory Visit: Payer: Self-pay | Admitting: Family Medicine

## 2020-09-09 ENCOUNTER — Other Ambulatory Visit: Payer: Self-pay | Admitting: Family Medicine

## 2020-09-20 ENCOUNTER — Other Ambulatory Visit: Payer: Self-pay | Admitting: Family Medicine

## 2020-09-24 DIAGNOSIS — M542 Cervicalgia: Secondary | ICD-10-CM | POA: Diagnosis not present

## 2020-09-24 DIAGNOSIS — G43719 Chronic migraine without aura, intractable, without status migrainosus: Secondary | ICD-10-CM | POA: Diagnosis not present

## 2020-09-24 DIAGNOSIS — M791 Myalgia, unspecified site: Secondary | ICD-10-CM | POA: Diagnosis not present

## 2020-09-24 DIAGNOSIS — G518 Other disorders of facial nerve: Secondary | ICD-10-CM | POA: Diagnosis not present

## 2020-10-01 ENCOUNTER — Other Ambulatory Visit: Payer: Self-pay | Admitting: Family Medicine

## 2020-10-07 ENCOUNTER — Other Ambulatory Visit: Payer: Self-pay | Admitting: Family Medicine

## 2020-10-07 DIAGNOSIS — Z794 Long term (current) use of insulin: Secondary | ICD-10-CM

## 2020-10-07 DIAGNOSIS — E1140 Type 2 diabetes mellitus with diabetic neuropathy, unspecified: Secondary | ICD-10-CM

## 2020-10-08 ENCOUNTER — Encounter: Payer: Self-pay | Admitting: Family Medicine

## 2020-10-15 ENCOUNTER — Ambulatory Visit: Payer: Medicaid Other | Admitting: Family Medicine

## 2020-10-15 ENCOUNTER — Other Ambulatory Visit (INDEPENDENT_AMBULATORY_CARE_PROVIDER_SITE_OTHER): Payer: Medicaid Other

## 2020-10-15 ENCOUNTER — Other Ambulatory Visit: Payer: Self-pay

## 2020-10-15 DIAGNOSIS — E1140 Type 2 diabetes mellitus with diabetic neuropathy, unspecified: Secondary | ICD-10-CM

## 2020-10-15 DIAGNOSIS — Z794 Long term (current) use of insulin: Secondary | ICD-10-CM

## 2020-10-15 LAB — POCT GLYCOSYLATED HEMOGLOBIN (HGB A1C): Hemoglobin A1C: 7.4 % — AB (ref 4.0–5.6)

## 2020-10-16 ENCOUNTER — Encounter: Payer: Self-pay | Admitting: Family Medicine

## 2020-10-19 ENCOUNTER — Other Ambulatory Visit: Payer: Self-pay

## 2020-10-19 ENCOUNTER — Ambulatory Visit: Payer: Medicaid Other | Admitting: Family Medicine

## 2020-10-19 ENCOUNTER — Encounter: Payer: Self-pay | Admitting: Pharmacist

## 2020-10-19 ENCOUNTER — Other Ambulatory Visit: Payer: Self-pay | Admitting: Family Medicine

## 2020-10-19 ENCOUNTER — Telehealth: Payer: Self-pay

## 2020-10-19 ENCOUNTER — Ambulatory Visit: Payer: Medicaid Other | Admitting: Pharmacist

## 2020-10-19 DIAGNOSIS — E1140 Type 2 diabetes mellitus with diabetic neuropathy, unspecified: Secondary | ICD-10-CM

## 2020-10-19 DIAGNOSIS — Z794 Long term (current) use of insulin: Secondary | ICD-10-CM

## 2020-10-19 NOTE — Progress Notes (Signed)
Patient showed up late for provider appt and was unable to be seen. Will re-schedule.  Provided patient Dexcom sensor and transmitter sample and patient applied to himself in clinic. No issues with application.   Patient requesting prescription for Accu-Chek FastClix lancing device as he lost his. His preferred pharmacy is [facility] [address].

## 2020-10-19 NOTE — Telephone Encounter (Signed)
Spoke with patient in regards to [facility] and insurance approval. The patient has not been seen for a face to face visit since July 2021. Patient had an apt with PCP for A1c and DM follow-up last week, however PCP was out due to an emergency. Patient did come in for a lab visit only for an A1c, but his insurance is needing an up to date face to face visit for [facility] approval. Patient scheduled with provider for this afternoon. I spoke to [place] about sensor samples when patient comes in for apt.

## 2020-10-20 ENCOUNTER — Other Ambulatory Visit: Payer: Self-pay | Admitting: Family Medicine

## 2020-10-20 DIAGNOSIS — E1140 Type 2 diabetes mellitus with diabetic neuropathy, unspecified: Secondary | ICD-10-CM

## 2020-10-20 MED ORDER — ACCU-CHEK FASTCLIX LANCET KIT: PACK | 6 refills | Status: AC

## 2020-10-20 NOTE — Progress Notes (Signed)
Sent in refill for lancet kit

## 2020-10-22 ENCOUNTER — Other Ambulatory Visit: Payer: Self-pay

## 2020-10-22 ENCOUNTER — Encounter: Payer: Self-pay | Admitting: Pharmacist

## 2020-10-22 ENCOUNTER — Ambulatory Visit (INDEPENDENT_AMBULATORY_CARE_PROVIDER_SITE_OTHER): Payer: Medicaid Other | Admitting: Pharmacist

## 2020-10-22 VITALS — BP 122/74 | HR 85 | Ht 67.0 in | Wt 183.8 lb

## 2020-10-22 DIAGNOSIS — Z794 Long term (current) use of insulin: Secondary | ICD-10-CM | POA: Diagnosis not present

## 2020-10-22 DIAGNOSIS — E118 Type 2 diabetes mellitus with unspecified complications: Secondary | ICD-10-CM

## 2020-10-22 DIAGNOSIS — E1140 Type 2 diabetes mellitus with diabetic neuropathy, unspecified: Secondary | ICD-10-CM | POA: Diagnosis not present

## 2020-10-22 DIAGNOSIS — E7800 Pure hypercholesterolemia, unspecified: Secondary | ICD-10-CM | POA: Diagnosis not present

## 2020-10-22 MED ORDER — LANTUS SOLOSTAR 100 UNIT/ML ~~LOC~~ SOPN: PEN_INJECTOR | SUBCUTANEOUS | 11 refills | Status: DC

## 2020-10-22 MED ORDER — TRULICITY 0.75 MG/0.5ML ~~LOC~~ SOAJ: 0.7500 mg | SUBCUTANEOUS | 0 refills | Status: DC

## 2020-10-22 NOTE — Patient Instructions (Signed)
It was nice to see you today!  Medication Changes: Begin Lantus 32 units daily  Finish supply for Victoza 1.8 mg daily x3 weeks, then start Trulicity 3.58 mg weekly x2 weeks  Continue Fiasp sliding scale with meals  Monitor blood sugars at home and keep a log (glucometer or piece of paper) to bring with you to your next visit.  Keep up the good work with diet and exercise. Aim for a diet full of vegetables, fruit and lean meats (chicken, [place], fish). Try to limit salt intake by eating fresh or frozen vegetables (instead of canned), rinse canned vegetables prior to cooking and do not add any additional salt to meals.

## 2020-10-22 NOTE — Progress Notes (Signed)
S:     Chief Complaint  Patient presents with  . Medication Management    Diabetes    Patient arrives in good spirits.  Presents for diabetes management and Dexcom G6 data interpretation. Patient was referred and last seen by Primary Care Provider on 03/06/20. Pharmacy has been follow-up periodically via telephone - no changes made to DM regimen on 07/30/20.   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   Today, patient reports medication adherence with DM medications. Reports taking Lantus 36 units daily and Fiasp 6-18 units with meals depending on carbohydrate intake and activity (averages 10-12 units/meal). Reports tolerating Victoza well with no GI complaints. Reports checking sugars 3-4x/day. Reports frequent use of Gvoke (glucagon autoinjector) - 4x in the past month. Reports ideal BG goal range is 130-150.    Insurance coverage/medication affordability: Medicaid   Patient reports adherence with medications.  Current diabetes medications include: Lantus (insulin glargine) 24 units daily (taking 36 units), Fiasp (insulin aspart) 8-10 units BID SSI (taking 6-18 units BID), Jardiance (empagliflozin) 25 mg daily, Victoza (liraglutide) 1.8 mg daily, metformin 500 mg BID Current hypertension medications include: carvedilol 6.25 mg BID, lisinopril 20 mg daily Current hyperlipidemia medications include: simvastatin 20 mg daily  Patient reports hypoglycemic events intermittently   O:    Dexcom G6 Report(Oct 09, 2020 - Oct 22, 2020) Average Glucose: 203 mg/dL Standard Deviation: 57 mg/dL Glucose Management Indicator (GMI): 8.2%   Time in Range: -Very high: 21% -High: 42% -In range: 36% -Low: <1% -Very low: 0%   Low Events: 2 hypoglycemic events in the 60's  Physical Exam Vitals reviewed.  Constitutional:      Appearance: Normal appearance.  Neurological:     General: No focal deficit present.     Mental Status: He is alert.  Psychiatric:        Mood  and Affect: Mood normal.        Behavior: Behavior normal.        Thought Content: Thought content normal.    Review of Systems  All other systems reviewed and are negative.   Lab Results  Component Value Date   HGBA1C 7.4 (A) 10/15/2020   Vitals:   10/22/20 0947  BP: 122/74  Pulse: 85  SpO2: 98%    Lipid Panel     Component Value Date/Time   CHOL 168 03/06/2020 1640   TRIG 154 (H) 03/06/2020 1640   HDL 44 03/06/2020 1640   CHOLHDL 3.8 03/06/2020 1640   CHOLHDL 3.9 11/02/2016 1551   VLDL 23 11/02/2016 1551   LDLCALC 97 03/06/2020 1640   LDLDIRECT 79 05/29/2009 2224    Clinical Atherosclerotic Cardiovascular Disease (ASCVD): No  The 10-year ASCVD risk score Mikey Bussing DC Jr., et al., 2013) is: 16%   Values used to calculate the score:     Age: 13 years     Sex: Male     Is Non-Hispanic African American: No     Diabetic: Yes     Tobacco smoker: No     Systolic Blood Pressure: 962 mmHg     Is BP treated: Yes     HDL Cholesterol: 44 mg/dL     Total Cholesterol: 168 mg/dL    A/P:  Diabetes  Diabetes longstanding currently near goal. Dexcom G6 report analyzed, which showed 2 hypoglycemic events in the 60s. Patient is able to verbalize appropriate hypoglycemia management plan. Diabetes medication adherence appears optimal. Patient continues to be an appropriate candidate for Dexcom as he  checks sugars 3-4x/day, uses 3-4 insulin injections/day, and at high risk of hypoglycemia as he frequently uses glucagon. To reduce number of daily injections, will switch from daily Victoza (liraglutide) to weekly Trulicity (dulaglutide).  Discussed incorporating more fiber and protein into diet to help prevent low sugars.  -Decreased Lantus (insulin glargine) from 36 to 32 units daily -Continued Victoza 1.8 mg daily x3 weeks, then start Trulicity 2.29 mg weekly x2 weeks -Continued Fiasp (insulin aspart) 6-18 units SSI w/meals  -Continued Jardiance (empagliflozin) 25 mg daily -Continued  metformin 500 mg BID -Extensively discussed pathophysiology of diabetes, recommended lifestyle interventions, dietary effects on blood sugar control -Counseled on s/sx of and management of hypoglycemia -Next A1C anticipated May 2022.   Medication Samples have been provided to the patient.  Drug name: Trulicity  Strength: 7.98 mg        Qty: 2 pens LOT: [id number]  Exp.Date: 02/19/2022  Dosing instructions: Inject 0.75 mg weekly  The patient has been instructed regarding the correct time, dose, and frequency of taking this medication, including desired effects and most common side effects.   ASCVD risk  ASCVD risk - primary prevention in patient with diabetes. Last LDL is not controlled. ASCVD risk score is not >20%  - moderate or high intensity statin indicated.  -Continued simvastatin 20 mg daily  HTN  Hypertension longstanding currently at goal. Blood pressure goal <130/80 mmHg. Patient antihypertensive medication adherence appears optimal.  -Continued carvedilol 6.25 mg BID -Continued lisinopril 20 mg daily  Written patient instructions provided.  Total time in face to face counseling 35 minutes.   Follow up Pharmacist Clinic Visit in 5 weeks.   Patient seen with Inis Sizer, PharmD Candidate, and Shauna Hugh, PharmD - PGY-1 Resident, and Lorel Monaco, PharmD, BCPS - PGY2 Pharmacy Resident.

## 2020-10-22 NOTE — Assessment & Plan Note (Signed)
ASCVD risk - primary prevention in patient with diabetes. Last LDL is not controlled. ASCVD risk score is not >20%  - moderate or high intensity statin indicated.  -Continued simvastatin 20 mg daily

## 2020-10-22 NOTE — Progress Notes (Deleted)
S:     Chief Complaint  Patient presents with  . Medication Management    Diabetes    Patient arrives ***.  Presents for diabetes evaluation, education, and management Patient was referred and last seen by Primary Care Provider on ***.  Patient was referred on ***.  Patient was last seen by Primary Care Provider on ***.   Patient reports Diabetes was diagnosed in ***.   No lows while sleeping  Family/Social History: ***  Insurance coverage/medication affordability: ***  Medication adherence reported *** .   Current diabetes medications include: *** Current hypertension medications include: *** Current hyperlipidemia medications include: ***  Patient {Actions; denies-reports:120008} hypoglycemic events.  Patient reported dietary habits: Eats *** meals/day Breakfast:*** Lunch:*** Dinner:*** Snacks:*** Drinks:***  Patient-reported exercise habits: ***   Patient {Actions; denies-reports:120008} nocturia (nighttime urination).  Patient {Actions; denies-reports:120008} neuropathy (nerve pain). Patient {Actions; denies-reports:120008} visual changes. Patient {Actions; denies-reports:120008} self foot exams.     O:  Physical Exam   ROS   Lab Results  Component Value Date   HGBA1C 7.4 (A) 10/15/2020   Vitals:   10/22/20 0947  BP: 122/74  Pulse: 85  SpO2: 98%    Lipid Panel     Component Value Date/Time   CHOL 168 03/06/2020 1640   TRIG 154 (H) 03/06/2020 1640   HDL 44 03/06/2020 1640   CHOLHDL 3.8 03/06/2020 1640   CHOLHDL 3.9 11/02/2016 1551   VLDL 23 11/02/2016 1551   LDLCALC 97 03/06/2020 1640   LDLDIRECT 79 05/29/2009 2224    Home fasting blood sugars: ***  2 hour post-meal/random blood sugars: ***.   Clinical Atherosclerotic Cardiovascular Disease (ASCVD): {YES/NO:21197} The 10-year ASCVD risk score Mikey Bussing DC Jr., et al., 2013) is: 16%   Values used to calculate the score:     Age: 60 years     Sex: Male     Is Non-Hispanic African  American: No     Diabetic: Yes     Tobacco smoker: No     Systolic Blood Pressure: 696 mmHg     Is BP treated: Yes     HDL Cholesterol: 44 mg/dL     Total Cholesterol: 168 mg/dL    A/P: Diabetes longstanding*** currently ***. Patient is *** able to verbalize appropriate hypoglycemia management plan. Medication adherence appears ***. Control is suboptimal due to ***. -{Meds adjust:18428} basal insulin *** (insulin ***). Patient will continue to titrate 1 unit every *** days if fasting blood sugar > 100mg /dl until fasting blood sugars reach goal or next visit.  -{Meds adjust:18428}  rapid insulin *** (insulin ***) to ***.  -{Meds adjust:18428} GLP-1 *** (generic name***) to ***.  -{Meds adjust:18428} SGLT2-I *** (generic name***) to ***. Counseled on sick day rules for ***. -Extensively discussed pathophysiology of diabetes, recommended lifestyle interventions, dietary effects on blood sugar control -Counseled on s/sx of and management of hypoglycemia -Next A1C anticipated ***.   ASCVD risk - primary***secondary prevention in patient with diabetes. Last LDL {Is/is not:9024} controlled. ASCVD risk score {Is/is not:9024} >20%  - {Desc; low/moderate/high:110033} intensity statin indicated. Aspirin {Is/is not:9024} indicated.  -{Meds adjust:18428} aspirin *** mg  -{Meds adjust:18428} ***statin *** mg.   Hypertension longstanding*** currently ***.  Blood pressure goal = *** mmHg. Medication adherence ***.  Blood pressure control is suboptimal due to ***. -***  Written patient instructions provided.  Total time in face to face counseling *** minutes.   Follow up Pharmacist/PCP*** Clinic Visit in ***.   Patient seen with ***

## 2020-10-22 NOTE — Assessment & Plan Note (Signed)
Diabetes longstanding currently near goal. Dexcom G6 report analyzed, which showed 2 hypoglycemic events in the 60s. Patient is able to verbalize appropriate hypoglycemia management plan. Diabetes medication adherence appears optimal. Patient continues to be an appropriate candidate for Dexcom as he checks sugars 3-4x/day, uses 3-4 insulin injections/day, and at high risk of hypoglycemia as he frequently uses glucagon. To reduce number of daily injections, will switch from daily Victoza (liraglutide) to weekly Trulicity (dulaglutide).  Discussed incorporating more fiber and protein into diet to help prevent low sugars.  -Decreased Lantus (insulin glargine) from 36 to 32 units daily -Continued Victoza 1.8 mg daily x3 weeks, then start Trulicity 6.72 mg weekly x2 weeks -Continued Fiasp (insulin aspart) 6-18 units SSI w/meals  -Continued Jardiance (empagliflozin) 25 mg daily -Continued metformin 500 mg BID

## 2020-10-22 NOTE — Progress Notes (Signed)
Reviewed: I agree with Dr. Koval's documentation and management. 

## 2020-10-27 ENCOUNTER — Telehealth: Payer: Self-pay

## 2020-10-27 NOTE — Telephone Encounter (Signed)
Completed prior auth for [facility] transmitter and sensors via covermymeds with updated face to face documentation.   Key: [id number]  Key: [id number]

## 2020-10-28 ENCOUNTER — Encounter: Payer: Self-pay | Admitting: Family Medicine

## 2020-10-28 NOTE — Telephone Encounter (Signed)
Sensors were approved, however the transmitter was not. I spoke to patient and he stated he is not due for a transmitter until ~May 28th. We will revisit this closer to date.

## 2020-11-02 ENCOUNTER — Telehealth: Payer: Self-pay

## 2020-11-02 NOTE — Telephone Encounter (Signed)
Received fax from pharmacy not dated. Transmitter not approved. Ottis Stain, CMA

## 2020-11-09 ENCOUNTER — Encounter: Payer: Self-pay | Admitting: Family Medicine

## 2020-11-09 MED ORDER — GVOKE HYPOPEN 2-PACK 0.5 MG/0.1ML ~~LOC~~ SOAJ: 0.5000 mg | SUBCUTANEOUS | 5 refills | Status: DC | PRN

## 2020-11-10 LAB — HM DIABETES EYE EXAM

## 2020-11-12 DIAGNOSIS — G43719 Chronic migraine without aura, intractable, without status migrainosus: Secondary | ICD-10-CM | POA: Diagnosis not present

## 2020-11-12 DIAGNOSIS — M542 Cervicalgia: Secondary | ICD-10-CM | POA: Diagnosis not present

## 2020-11-12 DIAGNOSIS — G518 Other disorders of facial nerve: Secondary | ICD-10-CM | POA: Diagnosis not present

## 2020-11-12 DIAGNOSIS — M791 Myalgia, unspecified site: Secondary | ICD-10-CM | POA: Diagnosis not present

## 2020-11-21 ENCOUNTER — Other Ambulatory Visit: Payer: Self-pay | Admitting: Family Medicine

## 2020-12-02 ENCOUNTER — Other Ambulatory Visit: Payer: Self-pay | Admitting: Family Medicine

## 2020-12-02 DIAGNOSIS — Z794 Long term (current) use of insulin: Secondary | ICD-10-CM

## 2020-12-02 DIAGNOSIS — E118 Type 2 diabetes mellitus with unspecified complications: Secondary | ICD-10-CM

## 2020-12-10 ENCOUNTER — Other Ambulatory Visit: Payer: Self-pay | Admitting: *Deleted

## 2020-12-10 DIAGNOSIS — Z794 Long term (current) use of insulin: Secondary | ICD-10-CM

## 2020-12-10 DIAGNOSIS — E118 Type 2 diabetes mellitus with unspecified complications: Secondary | ICD-10-CM

## 2020-12-10 MED ORDER — TRULICITY 0.75 MG/0.5ML ~~LOC~~ SOAJ: 0.7500 mg | SUBCUTANEOUS | 0 refills | Status: DC

## 2020-12-12 DIAGNOSIS — F411 Generalized anxiety disorder: Secondary | ICD-10-CM | POA: Diagnosis not present

## 2020-12-12 DIAGNOSIS — F900 Attention-deficit hyperactivity disorder, predominantly inattentive type: Secondary | ICD-10-CM | POA: Diagnosis not present

## 2020-12-12 DIAGNOSIS — F3181 Bipolar II disorder: Secondary | ICD-10-CM | POA: Diagnosis not present

## 2020-12-17 ENCOUNTER — Ambulatory Visit: Payer: Medicaid Other | Admitting: Pharmacist

## 2020-12-20 ENCOUNTER — Other Ambulatory Visit: Payer: Self-pay | Admitting: Family Medicine

## 2020-12-26 ENCOUNTER — Other Ambulatory Visit: Payer: Self-pay | Admitting: Family Medicine

## 2020-12-31 ENCOUNTER — Other Ambulatory Visit: Payer: Self-pay

## 2020-12-31 ENCOUNTER — Ambulatory Visit (INDEPENDENT_AMBULATORY_CARE_PROVIDER_SITE_OTHER): Payer: Medicaid Other | Admitting: Pharmacist

## 2020-12-31 DIAGNOSIS — Z794 Long term (current) use of insulin: Secondary | ICD-10-CM

## 2020-12-31 DIAGNOSIS — E1140 Type 2 diabetes mellitus with diabetic neuropathy, unspecified: Secondary | ICD-10-CM

## 2020-12-31 NOTE — Assessment & Plan Note (Signed)
Diabetes longstanding currently controlled. Patient is  able to verbalize appropriate hypoglycemia management plan. Medication adherence appears good. Control is suboptimal due to patient experiencing lows and overcorrected with rapid acting insulin. -Adjusted dose of rapid insulin Fiasp (insulin aspart) to a sliding scale regimen. Patient will take 8 units for BG < 150, 10 units for BG 150-250, and 12 units for BG >250. Patient agreeable and verbalized understanding of plan.  -Continued  basal insulin Lantus (insulin glargine)  at 32 units daily.  -Discontinued GLP-1 Trulicity (dulaglutide) due to hypoglycemic events.  -Continued SGLT2-I Jardiance (empagliflozin) 25 mg.  -Counseled on s/sx of and management of hypoglycemia -Next A1C anticipated in 3 weeks.

## 2020-12-31 NOTE — Progress Notes (Signed)
Reviewed: I agree with Dr. Koval's documentation and management. 

## 2020-12-31 NOTE — Patient Instructions (Addendum)
Hi, it was nice to see you today.  Today we discussed that our goal is to prevent you from using your glucagon. We would like your blood sugar to be in low 200's.  1. Stop Trucility (dulaglutide) / Victoza (liraglutide). 2. Continue Lantus (insulin glargine) 32 units daily in the morning. 3. For your Fiasp (insulin aspart), check your blood sugar before you eat, and please give the following based off your blood sugar. Do not give any additional Fiasp (insulin aspart) within 4 hours.  - Blood sugar < 150: 8 units  - Blood sugar 150 - 250: 10 units  - Blood sugar > 250: 12 units  Please follow up in 3 weeks on Thursday, June 2nd, shortly after 2:15pm.

## 2020-12-31 NOTE — Progress Notes (Signed)
    S:     Chief Complaint  Patient presents with  . Medication Management    Diabetes cgm     Patient arrives in good spirits.  Presents for diabetes evaluation, education, and management Patient was referred and last seen by Primary Care Provider Dr. Posey Pronto on 03/06/2020. Last seen in pharmacy clinic on 10/22/2020  Medication adherence reported good.   Current diabetes medications include:   Fiasp (insulin aspart)  patient uses 15-25 units BID  Lantus (insulin glargine) 32 units daily every morning  metformin 500mg  BID  Jardiance (empagliflozin) 25 mg daily Current hypertension medications include: carevedilol 6.25 mg BID, lisinopril 20 mg daily,  Current hyperlipidemia medications include: simvastatin 20 mg daily  Patient reports hypoglycemic events. Patient has used glucagon 5 times in the past month. Patient endorses drinking cup of juice when he feels low. Patient eats fruit (banana, apple) or juice before bed if he feels sugars are low. Patient endorses alarms waking him up intermittently.   Patient reported dietary habits: Eats 2 meals/day. Patient reports going only at most 5 hours without eating on a normal day.    O:    Lab Results  Component Value Date   HGBA1C 7.4 (A) 10/15/2020   There were no vitals filed for this visit.  Lipid Panel     Component Value Date/Time   CHOL 168 03/06/2020 1640   TRIG 154 (H) 03/06/2020 1640   HDL 44 03/06/2020 1640   CHOLHDL 3.8 03/06/2020 1640   CHOLHDL 3.9 11/02/2016 1551   VLDL 23 11/02/2016 1551   LDLCALC 97 03/06/2020 1640   LDLDIRECT 79 05/29/2009 2224    Clinical Atherosclerotic Cardiovascular Disease (ASCVD): No  The 10-year ASCVD risk score Mikey Bussing DC Jr., et al., 2013) is: 21.7%   Values used to calculate the score:     Age: 60 years     Sex: Male     Is Non-Hispanic African American: No     Diabetic: Yes     Tobacco smoker: No     Systolic Blood Pressure: 092 mmHg     Is BP treated: Yes     HDL Cholesterol:  44 mg/dL     Total Cholesterol: 168 mg/dL    A/P: Diabetes longstanding currently controlled. Patient is  able to verbalize appropriate hypoglycemia management plan. Medication adherence appears good. Control is suboptimal due to patient experiencing lows and overcorrected with rapid acting insulin. -Adjusted dose of rapid insulin Fiasp (insulin aspart) to a sliding scale regimen. Patient will take 8 units for BG < 150, 10 units for BG 150-250, and 12 units for BG >250. Patient agreeable and verbalized understanding of plan.  -Continued  basal insulin Lantus (insulin glargine)  at 32 units daily.  -Discontinued GLP-1 Trulicity (dulaglutide) due to hypoglycemic events.  -Continued SGLT2-I Jardiance (empagliflozin) 25 mg.  -Counseled on s/sx of and management of hypoglycemia -Next A1C anticipated in 3 weeks.    Written patient instructions provided.  Total time in face to face counseling 45 minutes.   Follow up Dr. Valentina Lucks in [facility] [facility] Visit in 3 weeks on June 2.   Patient seen with Marlowe Alt PharmD Candidate, Norina Buzzard, PharmD - PGY-1 Resident. and Lorel Monaco, PharmD, BCPS - PGY2 Pharmacy Resident.

## 2021-01-04 DIAGNOSIS — M791 Myalgia, unspecified site: Secondary | ICD-10-CM | POA: Diagnosis not present

## 2021-01-04 DIAGNOSIS — M542 Cervicalgia: Secondary | ICD-10-CM | POA: Diagnosis not present

## 2021-01-04 DIAGNOSIS — G518 Other disorders of facial nerve: Secondary | ICD-10-CM | POA: Diagnosis not present

## 2021-01-04 DIAGNOSIS — G43719 Chronic migraine without aura, intractable, without status migrainosus: Secondary | ICD-10-CM | POA: Diagnosis not present

## 2021-01-10 ENCOUNTER — Other Ambulatory Visit: Payer: Self-pay | Admitting: Family Medicine

## 2021-01-10 DIAGNOSIS — E1140 Type 2 diabetes mellitus with diabetic neuropathy, unspecified: Secondary | ICD-10-CM

## 2021-01-11 ENCOUNTER — Telehealth: Payer: Self-pay | Admitting: Pharmacist

## 2021-01-11 MED ORDER — DEXCOM G6 TRANSMITTER MISC: 3 refills | Status: DC

## 2021-01-11 NOTE — Telephone Encounter (Signed)
Attempted to contact patient following phone call reporting high blood glucose readings - reported 5/23 while I was out of office on my office phone.   Reviewed Dexcom CGM data - appears to have improved control over the past week but continues to have what appear to be post-prandial high readings.   Attempted to reach twice.  Left message twice.  Provided number for call back in needed.

## 2021-01-11 NOTE — Telephone Encounter (Signed)
Patient returned calls from earlier in the day.   He agrees that his blood sugar is improved as he reports NO low blood glucose symptoms or readings since last visit 5/12.  Patient believe he is not redosing within the 4 hour window following each dose of his Fiasp.  Patient continues to have readings - mostly post-prandial that may require a slight dose adjustment at next visit in 10 days.   NO change in insulin dosing today.     Review CGM prior to next visit.

## 2021-01-11 NOTE — Telephone Encounter (Signed)
Noted and agree. 

## 2021-01-15 ENCOUNTER — Other Ambulatory Visit: Payer: Self-pay | Admitting: Family Medicine

## 2021-01-20 DIAGNOSIS — Z79899 Other long term (current) drug therapy: Secondary | ICD-10-CM | POA: Diagnosis not present

## 2021-01-21 ENCOUNTER — Other Ambulatory Visit: Payer: Self-pay

## 2021-01-21 ENCOUNTER — Ambulatory Visit: Payer: Medicaid Other | Admitting: Pharmacist

## 2021-01-21 ENCOUNTER — Encounter: Payer: Self-pay | Admitting: Pharmacist

## 2021-01-21 DIAGNOSIS — E1140 Type 2 diabetes mellitus with diabetic neuropathy, unspecified: Secondary | ICD-10-CM

## 2021-01-21 DIAGNOSIS — Z794 Long term (current) use of insulin: Secondary | ICD-10-CM | POA: Diagnosis not present

## 2021-01-21 DIAGNOSIS — E118 Type 2 diabetes mellitus with unspecified complications: Secondary | ICD-10-CM | POA: Diagnosis not present

## 2021-01-21 MED ORDER — LANTUS SOLOSTAR 100 UNIT/ML ~~LOC~~ SOPN: PEN_INJECTOR | SUBCUTANEOUS | 11 refills | Status: DC

## 2021-01-21 NOTE — Progress Notes (Signed)
ReviewedQuintella Elliott with Dr. Graylin Shiver documentation and managemetn.

## 2021-01-21 NOTE — Assessment & Plan Note (Signed)
Diabetes longstanding currently uncontrolled. Dexcom G6 report analyzed, which showed blood sugars elevated throughout the day. Diabetes medication adherence appears optimal. Encouraged patient to use higher doses of rapid acting insulin (10-12 units) with meals for BG management. Patient verbalized agreement. Patient is able to verbalize appropriate hypoglycemia management plan. -Increased  basal insulin Lantus (insulin glargine) from 32 units to 34 units daily.  -Continued rapid insulin Fiasp (insulin aspart) sliding scale regimen. Patient will take 8 units for BG < 150, 10 units for BG 150-250, and 12 units for BG >250.  Encouraged to use 10-12 units as directed on sliding scale.  -Continued SGLT2-I Jardiance (empagliflozin) 25 mg daily -Continued metformin 500 mg twice daily -Counseled on s/sx of and management of hypoglycemia.  Patient appears to be appropriately using glucagon (GVOKE pens).  -Next A1C anticipated in 3 weeks.

## 2021-01-21 NOTE — Progress Notes (Signed)
S:     Chief Complaint  Patient presents with  . Medication Management    Diabetes F/u   Patient arrives in good spirits.  Presents for diabetes management and Dexcom G6 data interpretation. Patient was referred and last seen by Primary Care Provider on 03/06/20. Last seen in pharmacy clinic on 12/31/20.   Reports using an average of 8 units of insulin Fiasp (insulin aspart) with meals. Denies stacking rapid acting insulin within 4 hours. Reports using Lantus (insulin glargine) 32 units daily. Reports sugars have been "high" averaging >250. Reports 2 episodes of hypoglycemia requiring glucagon use in the last 1-3 weeks.   Patient reports adherence with medications.  Current diabetes medications include: Fiasp (insulin aspart) 8-10 units twice daily w/meals, Lantus (insulin glargine) 32 units daily, Jardiance (empagliflozin) 25 mg daily, metformin 500 mg twice daily Current hypertension medications include: carvedilol 6.25 mg twice daily, lisinopril 20 mg daily, Current hyperlipidemia medications include: simvastatin 20 mg daily  Patient reports hypoglycemic events - 2 episodes requiring glucagon in the past 1-3 weeks  Patient reported dietary habits: Eats 2 meals/day. Patient reports going only at most 5 hours without eating on a normal day.   O:  CGM report from last 14 days  Average Glucose 241  Glucose Meter Index (GMI) 9.1%  Glucose Variability    Time in Range (TIR) %  Very High 40%  High 41%  Target 18%  Low 0%  Very Low <1%   Physical Exam Constitutional:      Appearance: Normal appearance. He is normal weight.  Neurological:     Mental Status: He is alert and oriented to person, place, and time.  Psychiatric:        Mood and Affect: Mood normal.        Behavior: Behavior normal.        Thought Content: Thought content normal.    Review of Systems  All other systems reviewed and are negative.   Lab Results  Component Value Date   HGBA1C 7.4 (A)  10/15/2020   Vitals:   01/21/21 1450 01/21/21 1510  BP: (!) 161/76 (!) 146/80  Pulse: 79   SpO2: 98%     Lipid Panel     Component Value Date/Time   CHOL 168 03/06/2020 1640   TRIG 154 (H) 03/06/2020 1640   HDL 44 03/06/2020 1640   CHOLHDL 3.8 03/06/2020 1640   CHOLHDL 3.9 11/02/2016 1551   VLDL 23 11/02/2016 1551   LDLCALC 97 03/06/2020 1640   LDLDIRECT 79 05/29/2009 2224   Clinical Atherosclerotic Cardiovascular Disease (ASCVD): No  The 10-year ASCVD risk score Mikey Bussing DC Jr., et al., 2013) is: 23.2%   Values used to calculate the score:     Age: 16 years     Sex: Male     Is Non-Hispanic African American: No     Diabetic: Yes     Tobacco smoker: No     Systolic Blood Pressure: 622 mmHg     Is BP treated: Yes     HDL Cholesterol: 44 mg/dL     Total Cholesterol: 168 mg/dL   A/P: Diabetes longstanding currently uncontrolled. Dexcom G6 report analyzed, which showed blood sugars elevated throughout the day. Diabetes medication adherence appears optimal. Encouraged patient to use higher doses of rapid acting insulin (10-12 units) with meals for BG management. Patient verbalized agreement. Patient is able to verbalize appropriate hypoglycemia management plan. -Increased  basal insulin Lantus (insulin glargine) from 32 units to 34 units  daily.  -Continued rapid insulin Fiasp (insulin aspart) sliding scale regimen. Patient will take 8 units for BG < 150, 10 units for BG 150-250, and 12 units for BG >250.  Encouraged to use 10-12 units as directed on sliding scale.  -Continued SGLT2-I Jardiance (empagliflozin) 25 mg daily -Continued metformin 500 mg twice daily -Counseled on s/sx of and management of hypoglycemia.  Patient appears to be appropriately using glucagon (GVOKE pens).  -Next A1C anticipated in 3 weeks.   Written patient instructions provided. Total time in face to face counseling 30 minutes.   Follow up Pharmacist Clinic Visit in 3 weeks.   Patient seen with Caren Macadam PharmD Candidate, Romilda Garret, PharmD - PGY-1 Resident. and Lorel Monaco, PharmD, BCPS - PGY2 Pharmacy Resident.

## 2021-01-21 NOTE — Patient Instructions (Addendum)
It was nice to see you today!  Your goal blood sugar is 80-130 before eating and less than 180 after eating.  Medication Changes:  Increase Lantus to 34 units daily  Use insulin Fiasp (insulin aspart) sliding scale  8 units for BG < 150  10 units for BG 150-250  12 units for BG >250.  Patient agreeable and willing to try using higher doses of 10-12 units  Continue Jardiance (empagliflozin) 25 mg daily  Continue metformin 500 mg twice daily  Monitor blood sugars at home and keep a log (glucometer or piece of paper) to bring with you to your next visit.  Keep up the good work with diet and exercise. Aim for a diet full of vegetables, fruit and lean meats (chicken, [place], fish). Try to limit salt intake by eating fresh or frozen vegetables (instead of canned), rinse canned vegetables prior to cooking and do not add any additional salt to meals.

## 2021-01-24 ENCOUNTER — Other Ambulatory Visit: Payer: Self-pay | Admitting: Family Medicine

## 2021-01-24 DIAGNOSIS — E1140 Type 2 diabetes mellitus with diabetic neuropathy, unspecified: Secondary | ICD-10-CM

## 2021-01-29 ENCOUNTER — Other Ambulatory Visit: Payer: Self-pay | Admitting: Family Medicine

## 2021-01-31 ENCOUNTER — Other Ambulatory Visit: Payer: Self-pay | Admitting: Family Medicine

## 2021-02-11 ENCOUNTER — Ambulatory Visit: Payer: Medicaid Other | Admitting: Pharmacist

## 2021-02-11 ENCOUNTER — Other Ambulatory Visit: Payer: Self-pay

## 2021-02-11 DIAGNOSIS — G43719 Chronic migraine without aura, intractable, without status migrainosus: Secondary | ICD-10-CM | POA: Diagnosis not present

## 2021-02-11 DIAGNOSIS — Z794 Long term (current) use of insulin: Secondary | ICD-10-CM

## 2021-02-11 DIAGNOSIS — E1140 Type 2 diabetes mellitus with diabetic neuropathy, unspecified: Secondary | ICD-10-CM

## 2021-02-11 DIAGNOSIS — M791 Myalgia, unspecified site: Secondary | ICD-10-CM | POA: Diagnosis not present

## 2021-02-11 DIAGNOSIS — I10 Essential (primary) hypertension: Secondary | ICD-10-CM | POA: Diagnosis not present

## 2021-02-11 DIAGNOSIS — G518 Other disorders of facial nerve: Secondary | ICD-10-CM | POA: Diagnosis not present

## 2021-02-11 DIAGNOSIS — M542 Cervicalgia: Secondary | ICD-10-CM | POA: Diagnosis not present

## 2021-02-11 MED ORDER — LISINOPRIL 20 MG PO TABS: 0.5000 | ORAL_TABLET | Freq: Every day | ORAL | Status: DC

## 2021-02-11 MED ORDER — FIASP FLEXTOUCH 100 UNIT/ML ~~LOC~~ SOPN: 10.0000 [IU] | PEN_INJECTOR | Freq: Three times a day (TID) | SUBCUTANEOUS | 6 refills | Status: DC

## 2021-02-11 NOTE — Patient Instructions (Addendum)
Nice to see you today.   Increase FIASP to 10 -12-14 units prior to meals -   Continue same dose of Lantus 34 units once daily.   Reduce dose of lisinopril - take 1/2 tablet daily.    Follow-up in 3 weeks.

## 2021-02-12 ENCOUNTER — Encounter: Payer: Self-pay | Admitting: Pharmacist

## 2021-02-12 NOTE — Assessment & Plan Note (Signed)
Diabetes longstanding without any symptomatic low readings currently running higher than goal.. Patient is able to verbalize appropriate hypoglycemia management plan. Medication adherence appears excellent. Control is suboptimal due to suboptimal insulin regimen (likely needs more prandial insulin to improve post prandial control. -Continued basal insulin 34 (insulin glargine).  -Adjusted dose of  rapid insulin Fiasp (insulin aspart) to 06-02-13 units with this sliding scale including adjustments for carbs, activity and current reading.  -Counseled on s/sx of and management of hypoglycemia -Next A1C anticipated at next PCP visit

## 2021-02-12 NOTE — Progress Notes (Signed)
Reviewed: I agree with Dr. Koval's documentation and management. 

## 2021-02-12 NOTE — Assessment & Plan Note (Signed)
Hypertension longstanding with symptoms of orthostasis.  Patient denies any episodes or times when he believed that he was dehydrated.  Currently his diastolic reading, in office, of 70 is concerning for the cause of his dizziness.  Blood pressure goal = <130/80 mmHg.  - Decreased lisinopril from 20 to 10mg  daily (patient instructed to take 1/2 pill). - Continued carvedilol at this time, consideration for dose reduction of this agent at next visit if dizziness continues.

## 2021-02-12 NOTE — Progress Notes (Signed)
    S:     Chief Complaint  Patient presents with   Medication Management    Diabetes - CGM follow-up    Patient arrives in good spirits, ambulating without assistance.  Presents for diabetes evaluation, education, and management.  He complains of orthostatic dizziness on multiple occasions.  Patient last interaction with primary Care Provider on 10/28/2020.   Insurance coverage/medication affordability: Medicaid  Medication adherence reported excellent .   Current diabetes medications include: lantus (insulin glargine) 34 units, Fiasp (insulin aspart) 04-01-11 units. Current hypertension medications include: carvedilol and lisinopril.  Current hyperlipidemia medications include: simvastatin 20mg   Patient denies hypoglycemic events.  Patient reported dietary habits: Eats 3 meals/day  Patient-reported exercise habits: limited  O:  Physical Exam Constitutional:      Appearance: Normal appearance.  Pulmonary:     Effort: Pulmonary effort is normal.  Neurological:     Mental Status: He is alert.  Psychiatric:        Mood and Affect: Mood normal.        Behavior: Behavior normal.        Thought Content: Thought content normal.    Review of Systems  Neurological:  Positive for dizziness (orthostasis).   Lab Results  Component Value Date   HGBA1C 7.4 (A) 10/15/2020   Vitals:   02/11/21 1511  BP: 124/70  Pulse: 86  SpO2: 97%    Lipid Panel     Component Value Date/Time   CHOL 168 03/06/2020 1640   TRIG 154 (H) 03/06/2020 1640   HDL 44 03/06/2020 1640   CHOLHDL 3.8 03/06/2020 1640   CHOLHDL 3.9 11/02/2016 1551   VLDL 23 11/02/2016 1551   LDLCALC 97 03/06/2020 1640   LDLDIRECT 79 05/29/2009 2224    Avg home CGM glucose:  248  Very Low:  < 1 % Low: < 1 % In range:  16.1% High >180:  83.2% Very HIgh> 250:  48.8%  A/P: Diabetes longstanding without any symptomatic low readings currently running higher than goal.. Patient is able to verbalize appropriate  hypoglycemia management plan. Medication adherence appears excellent. Control is suboptimal due to suboptimal insulin regimen (likely needs more prandial insulin to improve post prandial control. -Continued basal insulin 34 (insulin glargine).  -Adjusted dose of  rapid insulin Fiasp (insulin aspart) to 06-02-13 units with this sliding scale including adjustments for carbs, activity and current reading.  -Counseled on s/sx of and management of hypoglycemia -Next A1C anticipated at next PCP visit  Hypertension longstanding with symptoms of orthostasis.  Patient denies any episodes or times when he believed that he was dehydrated.  Currently his diastolic reading, in office, of 70 is concerning for the cause of his dizziness.  Blood pressure goal = <130/80 mmHg.  - Decreased lisinopril from 20 to 10mg  daily (patient instructed to take 1/2 pill). - Continued carvedilol at this time, consideration for dose reduction of this agent at next visit if dizziness continues.   Written patient instructions provided.  Total time in face to face counseling 25 minutes.   Follow up Pharmacist 3 weeks then back to his PCP provider.  Patient seen with Romilda Garret, PharmD - PGY-1 Resident.

## 2021-02-14 ENCOUNTER — Other Ambulatory Visit: Payer: Self-pay | Admitting: Family Medicine

## 2021-02-15 ENCOUNTER — Other Ambulatory Visit: Payer: Self-pay | Admitting: Family Medicine

## 2021-02-27 DIAGNOSIS — F411 Generalized anxiety disorder: Secondary | ICD-10-CM | POA: Diagnosis not present

## 2021-02-27 DIAGNOSIS — F3181 Bipolar II disorder: Secondary | ICD-10-CM | POA: Diagnosis not present

## 2021-02-27 DIAGNOSIS — F900 Attention-deficit hyperactivity disorder, predominantly inattentive type: Secondary | ICD-10-CM | POA: Diagnosis not present

## 2021-03-04 ENCOUNTER — Ambulatory Visit: Payer: Medicaid Other | Admitting: Pharmacist

## 2021-03-04 ENCOUNTER — Ambulatory Visit: Payer: Medicaid Other

## 2021-03-16 ENCOUNTER — Other Ambulatory Visit: Payer: Self-pay | Admitting: Family Medicine

## 2021-03-18 ENCOUNTER — Other Ambulatory Visit: Payer: Self-pay

## 2021-03-18 ENCOUNTER — Ambulatory Visit (INDEPENDENT_AMBULATORY_CARE_PROVIDER_SITE_OTHER): Payer: Medicaid Other | Admitting: Pharmacist

## 2021-03-18 ENCOUNTER — Ambulatory Visit: Payer: Medicaid Other

## 2021-03-18 DIAGNOSIS — Z794 Long term (current) use of insulin: Secondary | ICD-10-CM

## 2021-03-18 DIAGNOSIS — I10 Essential (primary) hypertension: Secondary | ICD-10-CM | POA: Diagnosis not present

## 2021-03-18 DIAGNOSIS — E118 Type 2 diabetes mellitus with unspecified complications: Secondary | ICD-10-CM

## 2021-03-18 DIAGNOSIS — E1140 Type 2 diabetes mellitus with diabetic neuropathy, unspecified: Secondary | ICD-10-CM

## 2021-03-18 MED ORDER — FIASP FLEXTOUCH 100 UNIT/ML ~~LOC~~ SOPN: 16.0000 [IU] | PEN_INJECTOR | Freq: Three times a day (TID) | SUBCUTANEOUS | 6 refills | Status: DC

## 2021-03-18 MED ORDER — LANTUS SOLOSTAR 100 UNIT/ML ~~LOC~~ SOPN: PEN_INJECTOR | SUBCUTANEOUS | 11 refills | Status: DC

## 2021-03-18 NOTE — Telephone Encounter (Signed)
Hi Patrick Elliott this medication was d/c by Dr Valentina Lucks in May so will refuse refill.

## 2021-03-18 NOTE — Assessment & Plan Note (Signed)
Diabetes longstanding without any symptomatic low readings currently running higher than goal. Patient is able to verbalize appropriate hypoglycemia management plan. Medication adherence appears excellent. Control is suboptimal due to suboptimal insulin regimen (likely needs more prandial insulin to improve post prandial control.  -Increased Lantus (insulin glargine) to 38 units daily  -Increased Fiasp (insulin aspart) to 16 units with meals -Counseled on s/sx of and management of hypoglycemia -Counseled on stress management -Adjusted "high" BG alarm on Dexcom to be higher to try to improve sleep quality -Next A1C anticipated at next PCP visit

## 2021-03-18 NOTE — Progress Notes (Signed)
S:     Chief Complaint  Patient presents with   Medication Management    Diabetes   Patient arrives in good spirits, ambulating without assistance.  Presents for diabetes evaluation, education, and management.  He complains of orthostatic dizziness on multiple occasions.  Patient last interaction with Primary Care Provider on 10/28/2020. At last pharmacy clinic visit on 02/11/21, patient reported dizziness and BP in clinic was 124/70 so lisinopril was decreased from 20 to 10 mg daily. Fiasp dose was increased to 06-02-13 units with meals to help improve BG.   Today, patient reports recent death of a friend that has led to significant stress. Reports he has been primarily taking 14 units of Fiasp with each meal (three times daily) and an additional 10 units if his BG is in the 300s before bed. Reports one episode of low BG that did not require glucagon (improved with eating). Uncontrolled BG has also been contributing to his stress. Average home BG is up from last visit to 287. "High" alarms on Dexcom have been waking him up throughout the night and disrupting his sleep. Denies dizziness since decreasing lisinopril dose. Checks his BP at home and systolic BP is mostly in [id number].   Insurance coverage/medication affordability: Medicaid  Medication adherence reported excellent .   Current diabetes medications include: lantus (insulin glargine) 34 units daily, Fiasp (insulin aspart) 06-02-13 units with meals (has been taking 14 units with each meal and occasionally 10 units at bedtime if BG is still elevated). Current hypertension medications include: carvedilol 6.25 mg BID and lisinopril 10 mg daily Current hyperlipidemia medications include: simvastatin '20mg'$   Patient reports hypoglycemic events x1.  Patient reported dietary habits: Eats 3 meals/day  Patient-reported exercise habits: limited  O:  Physical Exam Constitutional:      Appearance: Normal appearance.  Pulmonary:     Effort:  Pulmonary effort is normal.  Neurological:     Mental Status: He is alert.  Psychiatric:        Mood and Affect: Mood normal.        Behavior: Behavior normal.        Thought Content: Thought content normal.    Review of Systems  Neurological:  Negative for dizziness (orthostasis).  All other systems reviewed and are negative.  Lab Results  Component Value Date   HGBA1C 7.4 (A) 10/15/2020   Vitals:   03/18/21 1424  BP: (!) 152/78  Pulse: (!) 126  SpO2: 99%     Lipid Panel     Component Value Date/Time   CHOL 168 03/06/2020 1640   TRIG 154 (H) 03/06/2020 1640   HDL 44 03/06/2020 1640   CHOLHDL 3.8 03/06/2020 1640   CHOLHDL 3.9 11/02/2016 1551   VLDL 23 11/02/2016 1551   LDLCALC 97 03/06/2020 1640   LDLDIRECT 79 05/29/2009 2224   Avg home CGM glucose:  287  Very Low:  < 1 % Low: < 1 % In range:  10% High >180:  19% Very High> 250:  70%  A/P:  Diabetes longstanding without any symptomatic low readings currently running higher than goal. Patient is able to verbalize appropriate hypoglycemia management plan. Medication adherence appears excellent. Control is suboptimal due to suboptimal insulin regimen (likely needs more prandial insulin to improve post prandial control.  -Increased Lantus (insulin glargine) to 38 units daily  -Increased Fiasp (insulin aspart) to 16 units with meals -Counseled on s/sx of and management of hypoglycemia -Counseled on stress management -Adjusted "high" BG alarm on Dexcom  to be higher to try to improve sleep quality -Next A1C anticipated at next PCP visit  Hypertension longstanding now with dizziness resolved since decreasing lisinopril to 10 mg daily.  BP reading in office today is above goal but home readings are at goal. Suspect this is due to discussing significant stress (heart rate also elevated). Blood pressure goal = <130/80 mmHg.  - Continued lisinopril 10 mg daily - Continued carvedilol 6.25 mg BID  Written patient  instructions provided.  Total time in face to face counseling 25 minutes.   Follow up Pharmacist 2 weeks. Patient seen with Rebbeca Paul, PharmD - PGY2 Pharmacy Resident.

## 2021-03-18 NOTE — Assessment & Plan Note (Signed)
Hypertension longstanding now with dizziness resolved since decreasing lisinopril to 10 mg daily.  BP reading in office today is above goal but home readings are at goal. Suspect this is due to discussing significant stress (heart rate also elevated). Blood pressure goal = <130/80 mmHg.  - Continued lisinopril 10 mg daily - Continued carvedilol 6.25 mg BID

## 2021-03-18 NOTE — Patient Instructions (Addendum)
It was nice to see you today!  Your goal blood sugar is 80-130 before eating and less than 180 after eating.  Medication Changes: INCREASE Lantus to 38 units daily  INCREASE Fiasp to 16 units with meals  Monitor blood sugars at home and keep a log (glucometer or piece of paper) to bring with you to your next visit.  Keep up the good work with diet and exercise. Aim for a diet full of vegetables, fruit and lean meats (chicken, [place], fish). Try to limit salt intake by eating fresh or frozen vegetables (instead of canned), rinse canned vegetables prior to cooking and do not add any additional salt to meals.   Follow up visit in 2 weeks.

## 2021-03-19 NOTE — Progress Notes (Signed)
Reviewed: I agree with Dr. Koval's documentation and management. 

## 2021-03-25 DIAGNOSIS — G43719 Chronic migraine without aura, intractable, without status migrainosus: Secondary | ICD-10-CM | POA: Diagnosis not present

## 2021-03-25 DIAGNOSIS — G518 Other disorders of facial nerve: Secondary | ICD-10-CM | POA: Diagnosis not present

## 2021-03-25 DIAGNOSIS — M791 Myalgia, unspecified site: Secondary | ICD-10-CM | POA: Diagnosis not present

## 2021-03-25 DIAGNOSIS — M542 Cervicalgia: Secondary | ICD-10-CM | POA: Diagnosis not present

## 2021-03-31 ENCOUNTER — Other Ambulatory Visit: Payer: Self-pay | Admitting: Family Medicine

## 2021-04-01 ENCOUNTER — Ambulatory Visit (INDEPENDENT_AMBULATORY_CARE_PROVIDER_SITE_OTHER): Payer: Medicaid Other

## 2021-04-01 ENCOUNTER — Other Ambulatory Visit: Payer: Self-pay

## 2021-04-01 ENCOUNTER — Encounter: Payer: Self-pay | Admitting: Pharmacist

## 2021-04-01 ENCOUNTER — Ambulatory Visit: Payer: Medicaid Other | Admitting: Pharmacist

## 2021-04-01 VITALS — BP 136/64 | Wt 191.8 lb

## 2021-04-01 DIAGNOSIS — E1140 Type 2 diabetes mellitus with diabetic neuropathy, unspecified: Secondary | ICD-10-CM

## 2021-04-01 DIAGNOSIS — E118 Type 2 diabetes mellitus with unspecified complications: Secondary | ICD-10-CM

## 2021-04-01 DIAGNOSIS — Z794 Long term (current) use of insulin: Secondary | ICD-10-CM

## 2021-04-01 DIAGNOSIS — Z23 Encounter for immunization: Secondary | ICD-10-CM

## 2021-04-01 MED ORDER — FIASP FLEXTOUCH 100 UNIT/ML ~~LOC~~ SOPN: 16.0000 [IU] | PEN_INJECTOR | Freq: Two times a day (BID) | SUBCUTANEOUS | 6 refills | Status: DC

## 2021-04-01 MED ORDER — LANTUS SOLOSTAR 100 UNIT/ML ~~LOC~~ SOPN: PEN_INJECTOR | SUBCUTANEOUS | 11 refills | Status: DC

## 2021-04-01 NOTE — Patient Instructions (Addendum)
It was nice to see you today!  Your goal blood sugar is 80-130 before eating and less than 180 after eating.  Medication Changes: INCREASE Lantus to 42 units daily  INCREASE Fiasp to 16-20 units with meals (twice daily) and 12-14 units with snacks when blood sugars are high  Keep up the good work with diet and exercise. Aim for a diet full of vegetables, fruit and lean meats (chicken, [place], fish). Try to limit salt intake by eating fresh or frozen vegetables (instead of canned), rinse canned vegetables prior to cooking and do not add any additional salt to meals.   Follow up with Dr. Posey Pronto in 2 weeks.  Follow up with pharmacist clinic in 1 month.

## 2021-04-01 NOTE — Progress Notes (Signed)
S:     Chief Complaint  Patient presents with   Medication Management    Diabetes Follow Up   Patient arrives in good spirits, ambulating without assistance.  Presents for diabetes evaluation, education, and management.  He complains of orthostatic dizziness on multiple occasions.  Patient last interaction with Primary Care Provider on 10/28/2020. At last pharmacy clinic visit on 02/11/21, patient reported dizziness and BP in clinic was 124/70 so lisinopril was decreased from 20 to 10 mg daily. Fiasp dose was increased to 06-02-13 units with meals to help improve BG.   At last pharmacy clinic visit on 03/18/21, patient reported experiencing significant stress due to the death of a friend. Dizziness resolved since decreasing lisinopril dose. 89% of BG were in the "high" range per Dexcom. Increased Lantus to 38 units daily and Fiasp to 16 units with meals.   Today, patient reports his stress levels have improved since last visit and he has started painting again. Denies dizziness. Has has one hypoglycemic event of 60 requiring glucagon. Reports problems with his Dexcom app that says it is in critical mode and beeps frequently at him regardless of BG. Has called Dexcom but unable to get through due to long wait times.   Insurance coverage/medication affordability: Medicaid  Medication adherence reported excellent .   Current diabetes medications include: Lantus (insulin glargine) 38 units daily, Fiasp (insulin aspart) 16-18 units with meals (2x daily) and 12-14 with a snack if BG are high (~4x/week) Current hypertension medications include: carvedilol 6.25 mg BID and lisinopril 10 mg daily Current hyperlipidemia medications include: simvastatin 20 mg  Patient reports hypoglycemic events x1 (BG 60, used Glucagon)  Patient reported dietary habits: Eats 2 meals/day and snacks  Patient-reported exercise habits: limited  O:  Physical Exam Constitutional:      Appearance: Normal appearance.   Pulmonary:     Effort: Pulmonary effort is normal.  Neurological:     Mental Status: He is alert.  Psychiatric:        Mood and Affect: Mood normal.        Behavior: Behavior normal.        Thought Content: Thought content normal.    Review of Systems  Neurological:  Negative for dizziness (orthostasis).  All other systems reviewed and are negative.  Lab Results  Component Value Date   HGBA1C 7.4 (A) 10/15/2020   Vitals:   04/01/21 1432  BP: (!) 162/78     Lipid Panel     Component Value Date/Time   CHOL 168 03/06/2020 1640   TRIG 154 (H) 03/06/2020 1640   HDL 44 03/06/2020 1640   CHOLHDL 3.8 03/06/2020 1640   CHOLHDL 3.9 11/02/2016 1551   VLDL 23 11/02/2016 1551   LDLCALC 97 03/06/2020 1640   LDLDIRECT 79 05/29/2009 2224   Avg home CGM glucose:  252 Time active: 81.5% Very Low:  0 % Low: 0% In range:  15.3% High >180:  84.7% Very High> 250:  51.5%  PHQ-9: 17  A/P:  Diabetes longstanding, BG improving since last visit but still elevated. Patient is able to verbalize appropriate hypoglycemia management plan. Medication adherence appears excellent. Control is suboptimal due to suboptimal insulin regimen.  -Increased Lantus (insulin glargine) to 42 units daily  -Increased Fiasp (insulin aspart) to 16-20 units with meals (twice daily)  -Continued Fiasp 12-14 units with snacks when BG remain elevated -Counseled on s/sx of and management of hypoglycemia -Counseled on stress management -Next A1C anticipated at next PCP visit  Follow up with PCP in 2 weeks. Follow up with pharmacist clinic in 1 month.   Written patient instructions provided.  Total time in face to face counseling 25 minutes.   Follow up Pharmacist 2 weeks. Patient seen with Meyer Russel, PharmD Candidate, Joseph Art, PharmD - PGY-1, and Rebbeca Paul, PharmD - PGY2 Pharmacy Resident.

## 2021-04-01 NOTE — Assessment & Plan Note (Signed)
Diabetes longstanding, BG improving since last visit but still elevated. Patient is able to verbalize appropriate hypoglycemia management plan. Medication adherence appears excellent. Control is suboptimal due to suboptimal insulin regimen.  -Increased Lantus (insulin glargine) to 42 units daily  -Increased Fiasp (insulin aspart) to 16-20 units with meals (twice daily)  -Continued Fiasp 12-14 units with snacks when BG remain elevated -Counseled on s/sx of and management of hypoglycemia -Counseled on stress management -Next A1C anticipated at next PCP visit

## 2021-04-01 NOTE — Progress Notes (Signed)
Prevnar 20 administered RD without complication.

## 2021-04-02 NOTE — Progress Notes (Signed)
Reviewed: I agree with Dr. Koval's documentation and management. 

## 2021-04-08 ENCOUNTER — Other Ambulatory Visit: Payer: Self-pay

## 2021-04-08 ENCOUNTER — Encounter: Payer: Self-pay | Admitting: *Deleted

## 2021-04-08 ENCOUNTER — Encounter
Admission: EM | Disposition: A | Discharge status: Home / Self Care | Payer: Self-pay | Source: Home / Self Care | Attending: Obstetrics and Gynecology

## 2021-04-08 ENCOUNTER — Inpatient Hospital Stay: Payer: Medicaid Other | Admitting: Anesthesiology

## 2021-04-08 ENCOUNTER — Emergency Department: Payer: Medicaid Other

## 2021-04-08 ENCOUNTER — Inpatient Hospital Stay
Admission: EM | Admit: 2021-04-08 | Discharge: 2021-04-13 | DRG: 853 | Disposition: A | Discharge status: Home / Self Care | Payer: Medicaid Other | Attending: Obstetrics and Gynecology | Admitting: Obstetrics and Gynecology

## 2021-04-08 DIAGNOSIS — Z5331 Laparoscopic surgical procedure converted to open procedure: Secondary | ICD-10-CM | POA: Diagnosis not present

## 2021-04-08 DIAGNOSIS — K766 Portal hypertension: Secondary | ICD-10-CM | POA: Diagnosis not present

## 2021-04-08 DIAGNOSIS — Z794 Long term (current) use of insulin: Secondary | ICD-10-CM

## 2021-04-08 DIAGNOSIS — Z87891 Personal history of nicotine dependence: Secondary | ICD-10-CM

## 2021-04-08 DIAGNOSIS — Z7189 Other specified counseling: Secondary | ICD-10-CM | POA: Diagnosis not present

## 2021-04-08 DIAGNOSIS — I5032 Chronic diastolic (congestive) heart failure: Secondary | ICD-10-CM | POA: Diagnosis present

## 2021-04-08 DIAGNOSIS — K631 Perforation of intestine (nontraumatic): Secondary | ICD-10-CM | POA: Diagnosis not present

## 2021-04-08 DIAGNOSIS — K219 Gastro-esophageal reflux disease without esophagitis: Secondary | ICD-10-CM | POA: Diagnosis present

## 2021-04-08 DIAGNOSIS — E861 Hypovolemia: Secondary | ICD-10-CM | POA: Diagnosis present

## 2021-04-08 DIAGNOSIS — Z79899 Other long term (current) drug therapy: Secondary | ICD-10-CM

## 2021-04-08 DIAGNOSIS — M1731 Unilateral post-traumatic osteoarthritis, right knee: Secondary | ICD-10-CM | POA: Diagnosis present

## 2021-04-08 DIAGNOSIS — L821 Other seborrheic keratosis: Secondary | ICD-10-CM | POA: Diagnosis present

## 2021-04-08 DIAGNOSIS — K55031 Focal (segmental) acute (reversible) ischemia of large intestine: Secondary | ICD-10-CM | POA: Diagnosis present

## 2021-04-08 DIAGNOSIS — K55041 Focal (segmental) acute infarction of large intestine: Secondary | ICD-10-CM | POA: Diagnosis present

## 2021-04-08 DIAGNOSIS — K7031 Alcoholic cirrhosis of liver with ascites: Secondary | ICD-10-CM | POA: Diagnosis present

## 2021-04-08 DIAGNOSIS — R739 Hyperglycemia, unspecified: Secondary | ICD-10-CM

## 2021-04-08 DIAGNOSIS — Z7984 Long term (current) use of oral hypoglycemic drugs: Secondary | ICD-10-CM

## 2021-04-08 DIAGNOSIS — K559 Vascular disorder of intestine, unspecified: Secondary | ICD-10-CM | POA: Diagnosis not present

## 2021-04-08 DIAGNOSIS — K7460 Unspecified cirrhosis of liver: Secondary | ICD-10-CM | POA: Diagnosis not present

## 2021-04-08 DIAGNOSIS — D734 Cyst of spleen: Secondary | ICD-10-CM | POA: Diagnosis not present

## 2021-04-08 DIAGNOSIS — I9589 Other hypotension: Secondary | ICD-10-CM | POA: Diagnosis present

## 2021-04-08 DIAGNOSIS — E1110 Type 2 diabetes mellitus with ketoacidosis without coma: Secondary | ICD-10-CM | POA: Diagnosis present

## 2021-04-08 DIAGNOSIS — Z933 Colostomy status: Secondary | ICD-10-CM

## 2021-04-08 DIAGNOSIS — E1140 Type 2 diabetes mellitus with diabetic neuropathy, unspecified: Secondary | ICD-10-CM

## 2021-04-08 DIAGNOSIS — Z20822 Contact with and (suspected) exposure to covid-19: Secondary | ICD-10-CM | POA: Diagnosis not present

## 2021-04-08 DIAGNOSIS — K529 Noninfective gastroenteritis and colitis, unspecified: Secondary | ICD-10-CM

## 2021-04-08 DIAGNOSIS — E1165 Type 2 diabetes mellitus with hyperglycemia: Secondary | ICD-10-CM | POA: Diagnosis not present

## 2021-04-08 DIAGNOSIS — R197 Diarrhea, unspecified: Secondary | ICD-10-CM | POA: Diagnosis not present

## 2021-04-08 DIAGNOSIS — E872 Acidosis, unspecified: Secondary | ICD-10-CM

## 2021-04-08 DIAGNOSIS — N179 Acute kidney failure, unspecified: Secondary | ICD-10-CM

## 2021-04-08 DIAGNOSIS — Z880 Allergy status to penicillin: Secondary | ICD-10-CM

## 2021-04-08 DIAGNOSIS — E871 Hypo-osmolality and hyponatremia: Secondary | ICD-10-CM | POA: Diagnosis present

## 2021-04-08 DIAGNOSIS — K660 Peritoneal adhesions (postprocedural) (postinfection): Secondary | ICD-10-CM | POA: Diagnosis not present

## 2021-04-08 DIAGNOSIS — E1142 Type 2 diabetes mellitus with diabetic polyneuropathy: Secondary | ICD-10-CM | POA: Diagnosis present

## 2021-04-08 DIAGNOSIS — Z433 Encounter for attention to colostomy: Secondary | ICD-10-CM

## 2021-04-08 DIAGNOSIS — I959 Hypotension, unspecified: Secondary | ICD-10-CM | POA: Diagnosis not present

## 2021-04-08 DIAGNOSIS — F1021 Alcohol dependence, in remission: Secondary | ICD-10-CM | POA: Diagnosis present

## 2021-04-08 DIAGNOSIS — E118 Type 2 diabetes mellitus with unspecified complications: Secondary | ICD-10-CM

## 2021-04-08 DIAGNOSIS — I110 Hypertensive heart disease with heart failure: Secondary | ICD-10-CM | POA: Diagnosis present

## 2021-04-08 DIAGNOSIS — A419 Sepsis, unspecified organism: Secondary | ICD-10-CM | POA: Diagnosis not present

## 2021-04-08 DIAGNOSIS — B999 Unspecified infectious disease: Secondary | ICD-10-CM | POA: Diagnosis not present

## 2021-04-08 DIAGNOSIS — D6959 Other secondary thrombocytopenia: Secondary | ICD-10-CM | POA: Diagnosis present

## 2021-04-08 DIAGNOSIS — F319 Bipolar disorder, unspecified: Secondary | ICD-10-CM | POA: Diagnosis present

## 2021-04-08 DIAGNOSIS — R6520 Severe sepsis without septic shock: Secondary | ICD-10-CM | POA: Diagnosis not present

## 2021-04-08 DIAGNOSIS — K625 Hemorrhage of anus and rectum: Secondary | ICD-10-CM

## 2021-04-08 DIAGNOSIS — Z8249 Family history of ischemic heart disease and other diseases of the circulatory system: Secondary | ICD-10-CM

## 2021-04-08 DIAGNOSIS — R112 Nausea with vomiting, unspecified: Secondary | ICD-10-CM

## 2021-04-08 DIAGNOSIS — G4700 Insomnia, unspecified: Secondary | ICD-10-CM | POA: Diagnosis present

## 2021-04-08 DIAGNOSIS — Z515 Encounter for palliative care: Secondary | ICD-10-CM | POA: Diagnosis not present

## 2021-04-08 DIAGNOSIS — K659 Peritonitis, unspecified: Secondary | ICD-10-CM | POA: Diagnosis present

## 2021-04-08 DIAGNOSIS — R6521 Severe sepsis with septic shock: Secondary | ICD-10-CM | POA: Diagnosis present

## 2021-04-08 DIAGNOSIS — Z882 Allergy status to sulfonamides status: Secondary | ICD-10-CM

## 2021-04-08 DIAGNOSIS — R269 Unspecified abnormalities of gait and mobility: Secondary | ICD-10-CM | POA: Diagnosis present

## 2021-04-08 DIAGNOSIS — E7800 Pure hypercholesterolemia, unspecified: Secondary | ICD-10-CM | POA: Diagnosis present

## 2021-04-08 DIAGNOSIS — F909 Attention-deficit hyperactivity disorder, unspecified type: Secondary | ICD-10-CM | POA: Diagnosis present

## 2021-04-08 DIAGNOSIS — F4310 Post-traumatic stress disorder, unspecified: Secondary | ICD-10-CM | POA: Diagnosis present

## 2021-04-08 DIAGNOSIS — E875 Hyperkalemia: Secondary | ICD-10-CM | POA: Diagnosis present

## 2021-04-08 HISTORY — PX: OSTOMY: SHX5997

## 2021-04-08 HISTORY — PX: COLON RESECTION SIGMOID: SHX6737

## 2021-04-08 HISTORY — PX: LAPAROSCOPY: SHX197

## 2021-04-08 LAB — CBC WITH DIFFERENTIAL/PLATELET
Abs Immature Granulocytes: 0.06 10*3/uL (ref 0.00–0.07)
Basophils Absolute: 0 10*3/uL (ref 0.0–0.1)
Basophils Relative: 0 %
Eosinophils Absolute: 0 10*3/uL (ref 0.0–0.5)
Eosinophils Relative: 0 %
HCT: 43.1 % (ref 39.0–52.0)
Hemoglobin: 14.5 g/dL (ref 13.0–17.0)
Immature Granulocytes: 1 %
Lymphocytes Relative: 6 %
Lymphs Abs: 0.7 10*3/uL (ref 0.7–4.0)
MCH: 28.1 pg (ref 26.0–34.0)
MCHC: 33.6 g/dL (ref 30.0–36.0)
MCV: 83.5 fL (ref 80.0–100.0)
Monocytes Absolute: 1.2 10*3/uL — ABNORMAL HIGH (ref 0.1–1.0)
Monocytes Relative: 10 %
Neutro Abs: 9.7 10*3/uL — ABNORMAL HIGH (ref 1.7–7.7)
Neutrophils Relative %: 83 %
Platelets: 121 10*3/uL — ABNORMAL LOW (ref 150–400)
RBC: 5.16 MIL/uL (ref 4.22–5.81)
RDW: 14.6 % (ref 11.5–15.5)
Smear Review: NORMAL
WBC: 11.7 10*3/uL — ABNORMAL HIGH (ref 4.0–10.5)
nRBC: 0 % (ref 0.0–0.2)

## 2021-04-08 LAB — URINE DRUG SCREEN, QUALITATIVE (ARMC ONLY)
Amphetamines, Ur Screen: POSITIVE — AB
Barbiturates, Ur Screen: NOT DETECTED
Benzodiazepine, Ur Scrn: NOT DETECTED
Cannabinoid 50 Ng, Ur ~~LOC~~: NOT DETECTED
Cocaine Metabolite,Ur ~~LOC~~: NOT DETECTED
MDMA (Ecstasy)Ur Screen: NOT DETECTED
Methadone Scn, Ur: NOT DETECTED
Opiate, Ur Screen: NOT DETECTED
Phencyclidine (PCP) Ur S: NOT DETECTED
Tricyclic, Ur Screen: POSITIVE — AB

## 2021-04-08 LAB — COMPREHENSIVE METABOLIC PANEL
ALT: 34 U/L (ref 0–44)
ALT: 24 U/L (ref 0–44)
AST: 32 U/L (ref 15–41)
AST: 19 U/L (ref 15–41)
Albumin: 3.5 g/dL (ref 3.5–5.0)
Albumin: 2.8 g/dL — ABNORMAL LOW (ref 3.5–5.0)
Alkaline Phosphatase: 41 U/L (ref 38–126)
Alkaline Phosphatase: 34 U/L — ABNORMAL LOW (ref 38–126)
Anion gap: 14 (ref 5–15)
Anion gap: 5 (ref 5–15)
BUN: 58 mg/dL — ABNORMAL HIGH (ref 8–23)
BUN: 42 mg/dL — ABNORMAL HIGH (ref 8–23)
CO2: 17 mmol/L — ABNORMAL LOW (ref 22–32)
CO2: 18 mmol/L — ABNORMAL LOW (ref 22–32)
Calcium: 8.4 mg/dL — ABNORMAL LOW (ref 8.9–10.3)
Calcium: 7.4 mg/dL — ABNORMAL LOW (ref 8.9–10.3)
Chloride: 92 mmol/L — ABNORMAL LOW (ref 98–111)
Chloride: 110 mmol/L (ref 98–111)
Creatinine, Ser: 3.28 mg/dL — ABNORMAL HIGH (ref 0.61–1.24)
Creatinine, Ser: 1.72 mg/dL — ABNORMAL HIGH (ref 0.61–1.24)
GFR, Estimated: 21 mL/min — ABNORMAL LOW (ref >60)
GFR, Estimated: 45 mL/min — ABNORMAL LOW (ref >60)
Glucose, Bld: 507 mg/dL (ref 70–99)
Glucose, Bld: 195 mg/dL — ABNORMAL HIGH (ref 70–99)
Potassium: 5 mmol/L (ref 3.5–5.1)
Potassium: 4 mmol/L (ref 3.5–5.1)
Sodium: 123 mmol/L — ABNORMAL LOW (ref 135–145)
Sodium: 133 mmol/L — ABNORMAL LOW (ref 135–145)
Total Bilirubin: 2.4 mg/dL — ABNORMAL HIGH (ref 0.3–1.2)
Total Bilirubin: 1.8 mg/dL — ABNORMAL HIGH (ref 0.3–1.2)
Total Protein: 6.3 g/dL — ABNORMAL LOW (ref 6.5–8.1)
Total Protein: 5.3 g/dL — ABNORMAL LOW (ref 6.5–8.1)

## 2021-04-08 LAB — GLUCOSE, CAPILLARY
Glucose-Capillary: 112 mg/dL — ABNORMAL HIGH (ref 70–99)
Glucose-Capillary: 117 mg/dL — ABNORMAL HIGH (ref 70–99)
Glucose-Capillary: 129 mg/dL — ABNORMAL HIGH (ref 70–99)
Glucose-Capillary: 138 mg/dL — ABNORMAL HIGH (ref 70–99)
Glucose-Capillary: 151 mg/dL — ABNORMAL HIGH (ref 70–99)
Glucose-Capillary: 157 mg/dL — ABNORMAL HIGH (ref 70–99)
Glucose-Capillary: 157 mg/dL — ABNORMAL HIGH (ref 70–99)
Glucose-Capillary: 157 mg/dL — ABNORMAL HIGH (ref 70–99)
Glucose-Capillary: 164 mg/dL — ABNORMAL HIGH (ref 70–99)
Glucose-Capillary: 165 mg/dL — ABNORMAL HIGH (ref 70–99)
Glucose-Capillary: 182 mg/dL — ABNORMAL HIGH (ref 70–99)
Glucose-Capillary: 184 mg/dL — ABNORMAL HIGH (ref 70–99)
Glucose-Capillary: 184 mg/dL — ABNORMAL HIGH (ref 70–99)
Glucose-Capillary: 295 mg/dL — ABNORMAL HIGH (ref 70–99)

## 2021-04-08 LAB — BLOOD GAS, VENOUS
Acid-base deficit: 11.6 mmol/L — ABNORMAL HIGH (ref 0.0–2.0)
Bicarbonate: 15.6 mmol/L — ABNORMAL LOW (ref 20.0–28.0)
O2 Saturation: 37.8 %
Patient temperature: 37
pCO2, Ven: 39 mmHg — ABNORMAL LOW (ref 44.0–60.0)
pH, Ven: 7.21 — ABNORMAL LOW (ref 7.250–7.430)
pO2, Ven: <31 mmHg — CL (ref 32.0–45.0)

## 2021-04-08 LAB — CORTISOL: Cortisol, Plasma: 91.1 ug/dL

## 2021-04-08 LAB — CBC
HCT: 38.2 % — ABNORMAL LOW (ref 39.0–52.0)
Hemoglobin: 12.8 g/dL — ABNORMAL LOW (ref 13.0–17.0)
MCH: 27.6 pg (ref 26.0–34.0)
MCHC: 33.5 g/dL (ref 30.0–36.0)
MCV: 82.3 fL (ref 80.0–100.0)
Platelets: 87 10*3/uL — ABNORMAL LOW (ref 150–400)
RBC: 4.64 MIL/uL (ref 4.22–5.81)
RDW: 15 % (ref 11.5–15.5)
WBC: 9 10*3/uL (ref 4.0–10.5)
nRBC: 0 % (ref 0.0–0.2)

## 2021-04-08 LAB — URINALYSIS, COMPLETE (UACMP) WITH MICROSCOPIC
Bilirubin Urine: NEGATIVE
Glucose, UA: >=500 mg/dL — AB
Hgb urine dipstick: NEGATIVE
Ketones, ur: NEGATIVE mg/dL
Leukocytes,Ua: NEGATIVE
Nitrite: NEGATIVE
Protein, ur: NEGATIVE mg/dL
Specific Gravity, Urine: 1.01 (ref 1.005–1.030)
WBC, UA: NONE SEEN WBC/hpf (ref 0–5)
pH: 6 (ref 5.0–8.0)

## 2021-04-08 LAB — LIPASE, BLOOD: Lipase: 27 U/L (ref 11–51)

## 2021-04-08 LAB — PROCALCITONIN: Procalcitonin: 42.98 ng/mL

## 2021-04-08 LAB — BETA-HYDROXYBUTYRIC ACID
Beta-Hydroxybutyric Acid: 0.11 mmol/L (ref 0.05–0.27)
Beta-Hydroxybutyric Acid: 0.24 mmol/L (ref 0.05–0.27)
Beta-Hydroxybutyric Acid: 0.18 mmol/L (ref 0.05–0.27)
Beta-Hydroxybutyric Acid: 0.29 mmol/L — ABNORMAL HIGH (ref 0.05–0.27)

## 2021-04-08 LAB — RESP PANEL BY RT-PCR (FLU A&B, COVID) ARPGX2
Influenza A by PCR: NEGATIVE
Influenza B by PCR: NEGATIVE
SARS Coronavirus 2 by RT PCR: NEGATIVE

## 2021-04-08 LAB — BASIC METABOLIC PANEL
Anion gap: 6 (ref 5–15)
BUN: 38 mg/dL — ABNORMAL HIGH (ref 8–23)
CO2: 18 mmol/L — ABNORMAL LOW (ref 22–32)
Calcium: 7.5 mg/dL — ABNORMAL LOW (ref 8.9–10.3)
Chloride: 112 mmol/L — ABNORMAL HIGH (ref 98–111)
Creatinine, Ser: 1.58 mg/dL — ABNORMAL HIGH (ref 0.61–1.24)
GFR, Estimated: 49 mL/min — ABNORMAL LOW (ref >60)
Glucose, Bld: 175 mg/dL — ABNORMAL HIGH (ref 70–99)
Potassium: 3.7 mmol/L (ref 3.5–5.1)
Sodium: 136 mmol/L (ref 135–145)

## 2021-04-08 LAB — LACTIC ACID, PLASMA
Lactic Acid, Venous: 3.7 mmol/L (ref 0.5–1.9)
Lactic Acid, Venous: 3.1 mmol/L (ref 0.5–1.9)
Lactic Acid, Venous: 1.6 mmol/L (ref 0.5–1.9)

## 2021-04-08 LAB — MAGNESIUM: Magnesium: 2.2 mg/dL (ref 1.7–2.4)

## 2021-04-08 LAB — PROTIME-INR
INR: 1.6 — ABNORMAL HIGH (ref 0.8–1.2)
Prothrombin Time: 18.8 seconds — ABNORMAL HIGH (ref 11.4–15.2)

## 2021-04-08 LAB — CBG MONITORING, ED
Glucose-Capillary: 397 mg/dL — ABNORMAL HIGH (ref 70–99)
Glucose-Capillary: 333 mg/dL — ABNORMAL HIGH (ref 70–99)

## 2021-04-08 LAB — TROPONIN I (HIGH SENSITIVITY): Troponin I (High Sensitivity): 10 ng/L (ref <18)

## 2021-04-08 LAB — ETHANOL: Alcohol, Ethyl (B): <10 mg/dL (ref <10)

## 2021-04-08 LAB — AMYLASE: Amylase: 62 U/L (ref 28–100)

## 2021-04-08 LAB — PHOSPHORUS: Phosphorus: 3.1 mg/dL (ref 2.5–4.6)

## 2021-04-08 LAB — AMMONIA: Ammonia: 24 umol/L (ref 9–35)

## 2021-04-08 LAB — MRSA NEXT GEN BY PCR, NASAL: MRSA by PCR Next Gen: NOT DETECTED

## 2021-04-08 SURGERY — LAPAROSCOPY, DIAGNOSTIC
Anesthesia: General | Site: Abdomen

## 2021-04-08 MED ORDER — KETAMINE HCL 50 MG/ML IJ SOLN
INTRAMUSCULAR | Status: AC
  Filled 2021-04-08: qty 1

## 2021-04-08 MED ORDER — PROPOFOL 10 MG/ML IV BOLUS
INTRAVENOUS | Status: DC | PRN
Start: 2021-04-08 — End: 2021-04-08
  Administered 2021-04-08: 120 mg via INTRAVENOUS

## 2021-04-08 MED ORDER — FENTANYL CITRATE (PF) 100 MCG/2ML IJ SOLN
INTRAMUSCULAR | Status: DC | PRN
  Administered 2021-04-08 (×2): 50 ug via INTRAVENOUS

## 2021-04-08 MED ORDER — POTASSIUM CHLORIDE 10 MEQ/100ML IV SOLN
10.0000 meq | INTRAVENOUS | Status: AC
  Administered 2021-04-08 (×2): 10 meq via INTRAVENOUS
  Filled 2021-04-08 (×2): qty 100

## 2021-04-08 MED ORDER — LACTATED RINGERS IV BOLUS
1000.0000 mL | Freq: Once | INTRAVENOUS | Status: AC
  Administered 2021-04-08: 1000 mL via INTRAVENOUS

## 2021-04-08 MED ORDER — MIDAZOLAM HCL 2 MG/2ML IJ SOLN
INTRAMUSCULAR | Status: DC | PRN
  Administered 2021-04-08: 2 mg via INTRAVENOUS

## 2021-04-08 MED ORDER — LIDOCAINE HCL (CARDIAC) PF 100 MG/5ML IV SOSY
PREFILLED_SYRINGE | INTRAVENOUS | Status: DC | PRN
  Administered 2021-04-08: 100 mg via INTRAVENOUS

## 2021-04-08 MED ORDER — SEVOFLURANE IN SOLN
RESPIRATORY_TRACT | Status: AC
  Filled 2021-04-08: qty 250

## 2021-04-08 MED ORDER — INSULIN REGULAR(HUMAN) IN NACL 100-0.9 UT/100ML-% IV SOLN
INTRAVENOUS | Status: DC
  Administered 2021-04-08: 1.8 [IU]/h via INTRAVENOUS
  Administered 2021-04-08: 11 [IU]/h via INTRAVENOUS
  Filled 2021-04-08: qty 100

## 2021-04-08 MED ORDER — SODIUM CHLORIDE 0.9 % IV BOLUS (SEPSIS): 1000.0000 mL | Freq: Once | INTRAVENOUS | Status: DC

## 2021-04-08 MED ORDER — METRONIDAZOLE 500 MG/100ML IV SOLN
500.0000 mg | Freq: Once | INTRAVENOUS | Status: AC
  Administered 2021-04-08: 500 mg via INTRAVENOUS
  Filled 2021-04-08: qty 100

## 2021-04-08 MED ORDER — ROCURONIUM BROMIDE 100 MG/10ML IV SOLN
INTRAVENOUS | Status: DC | PRN
  Administered 2021-04-08: 10 mg via INTRAVENOUS
  Administered 2021-04-08: 20 mg via INTRAVENOUS
  Administered 2021-04-08: 30 mg via INTRAVENOUS

## 2021-04-08 MED ORDER — INDOCYANINE GREEN 25 MG IV SOLR
INTRAVENOUS | Status: AC
  Filled 2021-04-08: qty 10

## 2021-04-08 MED ORDER — NOREPINEPHRINE 4 MG/250ML-% IV SOLN
INTRAVENOUS | Status: AC
  Administered 2021-04-08: 5 ug/min via INTRAVENOUS
  Filled 2021-04-08: qty 250

## 2021-04-08 MED ORDER — SODIUM CHLORIDE 0.9 % IV BOLUS
1000.0000 mL | Freq: Once | INTRAVENOUS | Status: AC
  Administered 2021-04-08: 1000 mL via INTRAVENOUS

## 2021-04-08 MED ORDER — SUGAMMADEX SODIUM 200 MG/2ML IV SOLN
INTRAVENOUS | Status: DC | PRN
  Administered 2021-04-08: 500 mg via INTRAVENOUS

## 2021-04-08 MED ORDER — METRONIDAZOLE 500 MG/100ML IV SOLN
500.0000 mg | Freq: Three times a day (TID) | INTRAVENOUS | Status: DC
  Administered 2021-04-08 – 2021-04-09 (×2): 500 mg via INTRAVENOUS
  Filled 2021-04-08 (×5): qty 100

## 2021-04-08 MED ORDER — CHLORHEXIDINE GLUCONATE CLOTH 2 % EX PADS
6.0000 | MEDICATED_PAD | Freq: Every day | CUTANEOUS | Status: DC
  Administered 2021-04-08 – 2021-04-13 (×4): 6 via TOPICAL

## 2021-04-08 MED ORDER — PHENYLEPHRINE HCL (PRESSORS) 10 MG/ML IV SOLN
INTRAVENOUS | Status: AC
  Filled 2021-04-08: qty 1

## 2021-04-08 MED ORDER — SODIUM CHLORIDE 0.9 % IV BOLUS (SEPSIS)
1000.0000 mL | Freq: Once | INTRAVENOUS | Status: AC
  Administered 2021-04-08: 1000 mL via INTRAVENOUS

## 2021-04-08 MED ORDER — LACTATED RINGERS IV SOLN: INTRAVENOUS | Status: DC

## 2021-04-08 MED ORDER — KETAMINE HCL 10 MG/ML IJ SOLN
INTRAMUSCULAR | Status: DC | PRN
  Administered 2021-04-08: 10 mg via INTRAVENOUS
  Administered 2021-04-08: 20 mg via INTRAVENOUS

## 2021-04-08 MED ORDER — SUCCINYLCHOLINE CHLORIDE 200 MG/10ML IV SOSY
PREFILLED_SYRINGE | INTRAVENOUS | Status: DC | PRN
  Administered 2021-04-08: 120 mg via INTRAVENOUS

## 2021-04-08 MED ORDER — 0.9 % SODIUM CHLORIDE (POUR BTL) OPTIME
TOPICAL | Status: DC | PRN
  Administered 2021-04-08: 3000 mL

## 2021-04-08 MED ORDER — INSULIN DETEMIR 100 UNIT/ML ~~LOC~~ SOLN
10.0000 [IU] | Freq: Every day | SUBCUTANEOUS | Status: DC
  Administered 2021-04-10 – 2021-04-11 (×2): 10 [IU] via SUBCUTANEOUS
  Filled 2021-04-08 (×3): qty 0.1

## 2021-04-08 MED ORDER — SODIUM CHLORIDE 0.9% IV SOLUTION: Freq: Once | INTRAVENOUS | Status: DC

## 2021-04-08 MED ORDER — POLYETHYLENE GLYCOL 3350 17 G PO PACK: 17.0000 g | PACK | Freq: Every day | ORAL | Status: DC | PRN

## 2021-04-08 MED ORDER — FENTANYL CITRATE (PF) 100 MCG/2ML IJ SOLN
50.0000 ug | INTRAMUSCULAR | Status: DC | PRN
Start: 2021-04-08 — End: 2021-04-13
  Administered 2021-04-08 – 2021-04-13 (×23): 50 ug via INTRAVENOUS
  Filled 2021-04-08 (×22): qty 2

## 2021-04-08 MED ORDER — PROPOFOL 10 MG/ML IV BOLUS
INTRAVENOUS | Status: AC
  Filled 2021-04-08: qty 20

## 2021-04-08 MED ORDER — SODIUM CHLORIDE (PF) 0.9 % IJ SOLN
INTRAMUSCULAR | Status: DC | PRN
  Administered 2021-04-08: 90 mL

## 2021-04-08 MED ORDER — SODIUM CHLORIDE 0.9 % IV SOLN: INTRAVENOUS | Status: DC

## 2021-04-08 MED ORDER — DOCUSATE SODIUM 100 MG PO CAPS: 100.0000 mg | ORAL_CAPSULE | Freq: Two times a day (BID) | ORAL | Status: DC | PRN

## 2021-04-08 MED ORDER — ONDANSETRON HCL 4 MG/2ML IJ SOLN
4.0000 mg | Freq: Once | INTRAMUSCULAR | Status: AC
  Administered 2021-04-08: 4 mg via INTRAVENOUS
  Filled 2021-04-08: qty 2

## 2021-04-08 MED ORDER — SODIUM CHLORIDE 0.9 % IV SOLN
250.0000 mL | INTRAVENOUS | Status: DC
  Administered 2021-04-08: 250 mL via INTRAVENOUS

## 2021-04-08 MED ORDER — SODIUM CHLORIDE 0.9 % IV SOLN
2.0000 g | Freq: Once | INTRAVENOUS | Status: AC
  Administered 2021-04-08: 2 g via INTRAVENOUS
  Filled 2021-04-08: qty 2

## 2021-04-08 MED ORDER — DEXTROSE IN LACTATED RINGERS 5 % IV SOLN: INTRAVENOUS | Status: DC

## 2021-04-08 MED ORDER — PANTOPRAZOLE SODIUM 40 MG IV SOLR
40.0000 mg | Freq: Two times a day (BID) | INTRAVENOUS | Status: DC
  Administered 2021-04-08 (×2): 40 mg via INTRAVENOUS
  Filled 2021-04-08 (×2): qty 40

## 2021-04-08 MED ORDER — SODIUM CHLORIDE 0.9 % IV SOLN
2.0000 g | Freq: Every day | INTRAVENOUS | Status: DC
  Filled 2021-04-08: qty 2

## 2021-04-08 MED ORDER — FENTANYL CITRATE (PF) 100 MCG/2ML IJ SOLN
INTRAMUSCULAR | Status: AC
  Filled 2021-04-08: qty 2

## 2021-04-08 MED ORDER — DEXTROSE 50 % IV SOLN: 0.0000 mL | INTRAVENOUS | Status: DC | PRN

## 2021-04-08 MED ORDER — ACETAMINOPHEN 10 MG/ML IV SOLN
INTRAVENOUS | Status: AC
  Filled 2021-04-08: qty 100

## 2021-04-08 MED ORDER — NOREPINEPHRINE BITARTRATE 1 MG/ML IV SOLN
INTRAVENOUS | Status: DC | PRN
  Administered 2021-04-08: 1 mL via INTRAVENOUS

## 2021-04-08 MED ORDER — INDOCYANINE GREEN 25 MG IV SOLR
INTRAVENOUS | Status: DC | PRN
  Administered 2021-04-08: 5 mg via INTRAVENOUS

## 2021-04-08 MED ORDER — MIDAZOLAM HCL 2 MG/2ML IJ SOLN
INTRAMUSCULAR | Status: AC
  Filled 2021-04-08: qty 2

## 2021-04-08 MED ORDER — INSULIN ASPART 100 UNIT/ML IJ SOLN
3.0000 [IU] | INTRAMUSCULAR | Status: DC
  Administered 2021-04-08: 6 [IU] via SUBCUTANEOUS
  Administered 2021-04-09 – 2021-04-10 (×3): 3 [IU] via SUBCUTANEOUS
  Filled 2021-04-08 (×4): qty 1

## 2021-04-08 MED ORDER — SODIUM CHLORIDE 0.9 % IV SOLN: 2.0000 g | Freq: Once | INTRAVENOUS | Status: DC

## 2021-04-08 MED ORDER — NOREPINEPHRINE 4 MG/250ML-% IV SOLN: 0.0000 ug/min | INTRAVENOUS | Status: DC

## 2021-04-08 MED ORDER — SODIUM CHLORIDE 0.9 % IV SOLN
INTRAVENOUS | Status: DC | PRN
  Administered 2021-04-08: 30 ug/min via INTRAVENOUS

## 2021-04-08 MED ORDER — VANCOMYCIN HCL IN DEXTROSE 1-5 GM/200ML-% IV SOLN
1000.0000 mg | Freq: Once | INTRAVENOUS | Status: AC
  Administered 2021-04-08: 1000 mg via INTRAVENOUS
  Filled 2021-04-08: qty 200

## 2021-04-08 MED ORDER — PHENYLEPHRINE HCL (PRESSORS) 10 MG/ML IV SOLN
INTRAVENOUS | Status: DC | PRN
  Administered 2021-04-08 (×6): 100 ug via INTRAVENOUS

## 2021-04-08 MED ORDER — NOREPINEPHRINE 4 MG/250ML-% IV SOLN
2.0000 ug/min | INTRAVENOUS | Status: DC
  Administered 2021-04-08: 2 ug/min via INTRAVENOUS

## 2021-04-08 SURGICAL SUPPLY — 70 items
APL PRP STRL LF DISP 70% ISPRP (MISCELLANEOUS) ×2
BULB RESERV EVAC DRAIN JP 100C (MISCELLANEOUS) ×1 IMPLANT
CANISTER SUCT 1200ML W/VALVE (MISCELLANEOUS) ×3 IMPLANT
CELL SAVER LIPIGURD (MISCELLANEOUS) ×2 IMPLANT
CHLORAPREP W/TINT 26 (MISCELLANEOUS) ×3 IMPLANT
DEVICE RETRIEVAL ALEXIS 14 (MISCELLANEOUS) IMPLANT
DRAIN CHANNEL JP 15F RND 16 (MISCELLANEOUS) ×1 IMPLANT
DRAPE LAPAROTOMY 100X77 ABD (DRAPES) ×3 IMPLANT
DRSG OPSITE POSTOP 3X4 (GAUZE/BANDAGES/DRESSINGS) ×2 IMPLANT
DRSG OPSITE POSTOP 4X10 (GAUZE/BANDAGES/DRESSINGS) ×1 IMPLANT
DRSG OPSITE POSTOP 4X8 (GAUZE/BANDAGES/DRESSINGS) IMPLANT
DRSG TEGADERM 4X10 (GAUZE/BANDAGES/DRESSINGS) IMPLANT
DRSG TELFA 3X8 NADH (GAUZE/BANDAGES/DRESSINGS) IMPLANT
ELECT BLADE 6.5 EXT (BLADE) ×3 IMPLANT
ELECT CAUTERY BLADE 6.4 (BLADE) ×3 IMPLANT
ELECT REM PT RETURN 9FT ADLT (ELECTROSURGICAL) ×3
ELECTRODE REM PT RTRN 9FT ADLT (ELECTROSURGICAL) ×2 IMPLANT
EXTRT SYSTEM ALEXIS 14CM (MISCELLANEOUS) ×3
EXTRT SYSTEM ALEXIS 17CM (MISCELLANEOUS)
GAUZE 4X4 16PLY ~~LOC~~+RFID DBL (SPONGE) ×4 IMPLANT
GLOVE SURG SYN 6.5 ES PF (GLOVE) ×9 IMPLANT
GLOVE SURG SYN 6.5 PF PI (GLOVE) ×6 IMPLANT
GLOVE SURG UNDER POLY LF SZ7 (GLOVE) ×9 IMPLANT
GOWN STRL REUS W/ TWL LRG LVL3 (GOWN DISPOSABLE) ×8 IMPLANT
GOWN STRL REUS W/TWL LRG LVL3 (GOWN DISPOSABLE) ×12
HANDLE SUCTION POOLE (INSTRUMENTS) ×2 IMPLANT
HOLDER FOLEY CATH W/STRAP (MISCELLANEOUS) ×3 IMPLANT
KIT OSTOMY 2 PC DRNBL 2.25 STR (WOUND CARE) IMPLANT
KIT OSTOMY DRAINABLE 2.25 STR (WOUND CARE) ×3
KIT TURNOVER KIT A (KITS) ×3 IMPLANT
LABEL OR SOLS (LABEL) ×3 IMPLANT
LIGASURE IMPACT 36 18CM CVD LR (INSTRUMENTS) ×3 IMPLANT
LIGHT WAVEGUIDE WIDE FLAT (MISCELLANEOUS) ×3 IMPLANT
MANIFOLD NEPTUNE II (INSTRUMENTS) ×3 IMPLANT
NEEDLE HYPO 22GX1.5 SAFETY (NEEDLE) ×4 IMPLANT
NS IRRIG 1000ML POUR BTL (IV SOLUTION) ×3 IMPLANT
PACK BASIN MAJOR ARMC (MISCELLANEOUS) ×3 IMPLANT
PACK COLON CLEAN CLOSURE (MISCELLANEOUS) ×3 IMPLANT
PAD DRESSING TELFA 3X8 NADH (GAUZE/BANDAGES/DRESSINGS) IMPLANT
RELOAD PROXIMATE 75MM BLUE (ENDOMECHANICALS) ×6 IMPLANT
RELOAD STAPLE 75 3.8 BLU REG (ENDOMECHANICALS) IMPLANT
RETRACTOR WND ALEXIS-O 25 LRG (MISCELLANEOUS) IMPLANT
RTRCTR WOUND ALEXIS O 25CM LRG (MISCELLANEOUS)
SEALER TISSUE X1 CVD JAW (INSTRUMENTS) ×1 IMPLANT
SOL PREP PVP 2OZ (MISCELLANEOUS) ×3
SOLUTION PREP PVP 2OZ (MISCELLANEOUS) ×2 IMPLANT
SPONGE DRAIN TRACH 4X4 STRL 2S (GAUZE/BANDAGES/DRESSINGS) ×1 IMPLANT
SPONGE T-LAP 18X18 ~~LOC~~+RFID (SPONGE) ×15 IMPLANT
STAPLER PROXIMATE 75MM BLUE (STAPLE) ×1 IMPLANT
STAPLER SKIN PROX 35W (STAPLE) ×4 IMPLANT
SUCTION POOLE HANDLE (INSTRUMENTS) ×3
SURGILUBE 2OZ TUBE FLIPTOP (MISCELLANEOUS) IMPLANT
SUT ETHILON 3-0 FS-10 30 BLK (SUTURE) ×3
SUT PDS AB 1 TP1 54 (SUTURE) IMPLANT
SUT PROLENE 2 0 SH DA (SUTURE) ×1 IMPLANT
SUT SILK 2 0 (SUTURE)
SUT SILK 2 0 SH (SUTURE) ×2 IMPLANT
SUT SILK 2-0 18XBRD TIE 12 (SUTURE) IMPLANT
SUT SILK 3 0 (SUTURE)
SUT SILK 3-0 (SUTURE) ×3 IMPLANT
SUT SILK 3-0 18XBRD TIE 12 (SUTURE) IMPLANT
SUT VIC AB 3-0 SH 27 (SUTURE)
SUT VIC AB 3-0 SH 27X BRD (SUTURE) IMPLANT
SUT VICRYL 3-0 CR8 SH (SUTURE) ×1 IMPLANT
SUTURE EHLN 3-0 FS-10 30 BLK (SUTURE) ×1 IMPLANT
SYR 20ML LL LF (SYRINGE) ×3 IMPLANT
SYR 30ML LL (SYRINGE) ×2 IMPLANT
SYSTEM CONTND EXTRCTN KII BLLN (MISCELLANEOUS) IMPLANT
TOWEL OR 17X26 4PK STRL BLUE (TOWEL DISPOSABLE) ×3 IMPLANT
TRAY FOLEY SLVR 16FR LF STAT (SET/KITS/TRAYS/PACK) ×3 IMPLANT

## 2021-04-08 NOTE — Progress Notes (Signed)
Inpatient Diabetes Program Recommendations  AACE/ADA: New Consensus Statement on Inpatient Glycemic Control (2015)  Target Ranges:  Prepandial:   less than 140 mg/dL      Peak postprandial:   less than 180 mg/dL (1-2 hours)      Critically ill patients:  140 - 180 mg/dL   Lab Results  Component Value Date   GLUCAP 184 (H) 04/08/2021   HGBA1C 7.4 (A) 10/15/2020    Review of Glycemic Control Results for Patrick Elliott, Patrick Elliott" (MRN [id number]) as of 04/08/2021 15:06  Ref. Range 04/08/2021 12:12 04/08/2021 13:01 04/08/2021 13:51 04/08/2021 14:34 04/08/2021 15:01  Glucose-Capillary Latest Ref Range: 70 - 99 mg/dL 157 (H) 164 (H) 165 (H) 184 (H) 184 (H)   Diabetes history: DM 2 Outpatient Diabetes medications:  Fiasp 16-20 units bid, Lantus 42 units daily, Jardiance 25 mg daily, Metformin 500 mg bid Current orders for Inpatient glycemic control:  IV insulin/DKA orders   Inpatient Diabetes Program Recommendations:    Note that CMP pending.  Once acidosis cleared, consider Lantus 26 units- 2 hours prior to d/c of insulin drip.  Also consider Novolog sensitive q 4 hours.  Will follow.  Thanks,  Adah Perl, RN, BC-ADM Inpatient Diabetes Coordinator Pager [phone]  (8a-5p)

## 2021-04-08 NOTE — Progress Notes (Signed)
CODE SEPSIS - PHARMACY COMMUNICATION  **Broad Spectrum Antibiotics should be administered within 1 hour of Sepsis diagnosis**  Time Code Sepsis Called/Page Received: 0310  Antibiotics Ordered: Cefepime + vancomycin + metronidazole  Time of 1st antibiotic administration: 0358  Additional action taken by pharmacy: N/A   Patrick Elliott 04/08/2021  3:13 AM

## 2021-04-08 NOTE — Anesthesia Preprocedure Evaluation (Signed)
Anesthesia Evaluation  Patient identified by MRN, date of birth, ID band Patient awake  General Assessment Comment:  Patient presented with BRBPR, sepsis, DKA; concern for ischemic colitis. DKA is resolving, sugars down from 500s to 200s. He does have hyponatremia though the sodium corrected for glucose is 130-133.  Patient appears well although in pain, joking around with me at appropriate points.  Reviewed: Allergy & Precautions, NPO status , Patient's Chart, lab work & pertinent test results  History of Anesthesia Complications Negative for: history of anesthetic complications  Airway Mallampati: II  TM Distance: >3 FB Neck ROM: Full    Dental  (+) Poor Dentition, Missing   Pulmonary neg pulmonary ROS, neg sleep apnea, neg COPD, Patient abstained from smoking.Not current smoker, former smoker,    breath sounds clear to auscultation       Cardiovascular Exercise Tolerance: Good METShypertension, Pt. on medications +CHF  (-) CAD and (-) Past MI (-) dysrhythmias  Rhythm:Regular Rate:Normal - Systolic murmurs Hx of CHF exacerbation 15 years ago, said he was intubated and "put into a medical coma" to save his life. Since then, patient says he follows up with cardiologist and at his last visit, said no changes to meds and stable.   Neuro/Psych PSYCHIATRIC DISORDERS Anxiety Bipolar Disorder  Neuromuscular disease    GI/Hepatic GERD  ,(+) Cirrhosis     (-) substance abuse  ,   Endo/Other  diabetes, Poorly Controlled  Renal/GU ARFRenal disease     Musculoskeletal   Abdominal   Peds  Hematology  (+) anemia ,   Anesthesia Other Findings Past Medical History: No date: Anemia No date: CHF (congestive heart failure) (HCC)     Comment:  DIASTOLIC No date: Cirrhosis ([place]) No date: Diabetes mellitus No date: Dizziness No date: Hypertension No date: Syncope and collapse No date: Tachycardia  Reproductive/Obstetrics                             Anesthesia Physical Anesthesia Plan  ASA: 4 and emergent  Anesthesia Plan: General   Post-op Pain Management:    Induction: Intravenous and Rapid sequence  PONV Risk Score and Plan: 4 or greater and Ondansetron, Dexamethasone, Midazolam and Treatment may vary due to age or medical condition  Airway Management Planned: Oral ETT and Video Laryngoscope Planned  Additional Equipment: Arterial line  Intra-op Plan:   Post-operative Plan: Possible Post-op intubation/ventilation  Informed Consent: I have reviewed the patients History and Physical, chart, labs and discussed the procedure including the risks, benefits and alternatives for the proposed anesthesia with the patient or authorized representative who has indicated his/her understanding and acceptance.     Dental advisory given  Plan Discussed with: CRNA and Surgeon  Anesthesia Plan Comments: (Discussed risks of anesthesia with patient with his girlfriend/medical POA at bedside, including PONV, sore throat, lip/dental damage. Rare risks discussed as well, such as cardiorespiratory and neurological sequelae, and allergic reactions. Discussed possbility of prolonged intubation given comorbidities but we will make reasonable efforts to extubate in OR if safe. Patient counseled on being higher risk for anesthesia due to comorbidities: DKA, sepsis, CHF, cirrhosis. Patient was told about increased risk of cardiac and respiratory events, including death. Patient understands. )        Anesthesia Quick Evaluation

## 2021-04-08 NOTE — Consult Note (Signed)
PHARMACY CONSULT NOTE  Pharmacy Consult for Electrolyte Monitoring and Replacement   Recent Labs: Potassium  Date Value  04/08/2021 3.7 mmol/L  04/25/2012 4.2 mEq/L   Magnesium (mg/dL)  Date Value  04/08/2021 2.2   Calcium (mg/dL)  Date Value  04/08/2021 7.5 (L)  04/25/2012 9.3   Albumin (g/dL)  Date Value  04/08/2021 2.8 (L)  04/16/2019 4.3  04/25/2012 4.1   Phosphorus (mg/dL)  Date Value  04/08/2021 3.1   Sodium  Date Value  04/08/2021 136 mmol/L  03/06/2020 136 mmol/L  04/25/2012 134 mEq/L (L)   Assessment: Patient is a 60 y/o M with medical history including diabetes, alcohol abuse, HTN, HLD, bipolar disorder, ADHD, anxiety, peripheral neuropathy, diastolic HF who is admitted with sepsis. Pharmacy consulted to assist with electrolyte monitoring and replacement as indicated.   Patient is on an insulin infusion  Goal of Therapy:  Electrolytes within normal limits K+ > 4 while on insulin infusion  Plan:  --Will order KCL IV 7mq x 2 doses --With AKI, on fluid resuscitation --Continue to follow along. Will recheck with AM labs  WPearla Dubonnet 04/08/2021 5:54 PM

## 2021-04-08 NOTE — Op Note (Signed)
Preoperative diagnosis: peritonitis  Postoperative diagnosis: colon perforation and ischemia  Procedure: diagnostic laparoscopy, open abdominal washout, sigmoidectomy and colostomy (Hartmann's procedure).   Anesthesia: GETA  Surgeon: Benjamine Sprague, DO  Wound Classification: clean contaminated  Specimen: Sigmoid colon Complications: None apparent  EBL: 150 mL  Indications:  Patient is a 60 y.o. male with peritonitis and vital sign instability taken for exploration.  See H&P for further details.  Description of procedure:  The patient was placed in the supine position and general endotracheal anesthesia was induced. A time-out was completed verifying correct patient, procedure, site, positioning, and implant(s) and/or special equipment prior to beginning this procedure. Preoperative antibiotics were continued from floor. The abdomen was prepped and draped in the usual sterile fashion.  Foley and NG tube placed.  Palmer's point was chosen to insert a Veress needle, and after confirming 2 clicks, and his positive saline drop test, insufflation initiated with minimal increase in abdominal pressure.  Pressure was then increased to 15 mm.  Needle removed and 5 mm port placed through the same area using Optiview technique.  Upon entering the abdominal cavity, no injury from previous Veress needle placement or Optiview was noted.    Inspection of the abdomen noted a focal area of the sigmoid colon with intense adhesions and exudative discharge, surrounded by purulent fluid, indicating infection within the abdominal cavity.  In the center of this area within the colon mesenteric side was a area of necrosis.  Concern was for an ischemic episode causing a colon perforation with abdominal contents spillage.  Decision was made at this point to proceed with a Hartman's procedure to prevent further decline in health from the injured colon.  No obvious perforation was noted otherwise.  The rest of the colon as  well as the surrounding small bowel did not have any obvious pathology.  A vertical midline incision was made from slightly above the umbilicus down to the pubis. This was deepened through the subcutaneous tissues and hemostasis was achieved with electrocautery. The linea alba was identified and incised and the peritoneal cavity entered.Small bowel retracted to the right using a moist towel  Using electrocautery, the colon was freed from its peritoneal attachments along the line of Toldt proximally from the mid descending colon and distally to rectosigmoid junction.  Points of transection were selected proximally at mid descending colon and distally at rectosigmoid junction, well beyond the areas of inflammation and grossly thickened colon serosa. The bowel was divided with 75 mm blue load stapler stapler at t both ends Enseal used to divide the mesentery close to the colon. The specimen was removed, silk suture placed at the distal end, and sent to pathology. The abdominal cavity was then copiously irrigated until clear lfluid return noted and hemostasis was checked.  Rectal stump was tagged with 2-0 Prolene suture   The proximal colon reached easily to the proposed colostomy site on left abdominal wall without tension. A disk of skin was removed from the colostomy site and the incision was deepened through all layers of the abdominal wall and dilated to admit two fingers.  After the ostomy was created in the abdominal wall, it was noted the proximal end of the colon started to turn ischemic, therefore another blue load stapler was used to transecte at the more viable tissue, and the ischemic colon was removed out of the operative field.  The colon was passed out through the ostomy site without torsion or tension.   Anderson drain was then  placed through the right lower quadrant abdominal wall and placed within the pelvis.  Secured to the skin using 3-0 nylon.  This was covered with JP drain dressing at  the end of the procedure.  A clean closure protocol initiated and the midline incision fascia was closed with running suture of 1 PDS x2 after infusion of Exparel. The skin and 2 port sites were closed with skin staples and dressed with honeycomb dressing. The still viable colon and was trimmed of excess fat and colostomy was matured with multiple interrupted sutures of 3-0 Vicryl.  Patches of black areas were noted within the mucosa after opening the ostomy, but the gross majority of the serosa and mucosa still looked grossly viable.  An ostomy bag was applied.   The patient tolerated the procedure well, extubated, and transferred to ICU in stable condition.  Sponge count and instrument count all correct at end of procedure.

## 2021-04-08 NOTE — Consult Note (Signed)
Pharmacy Antibiotic Note  Patrick Elliott is a 60 y.o. male with medical history including diabetes, alcohol abuse, HTN, HLD, bipolar disorder, ADHD, anxiety, peripheral neuropathy, diastolic HF admitted on 0000000 with sepsis.  Pharmacy has been consulted for cefepime dosing. Patient is also ordered metronidazole.  Infection source appears to be intra-abdominal at this time  Plan:  Cefepime 2 g IV q24h  Weight: 87.9 kg (193 lb 12.8 oz)  Temp (24hrs), Avg:99.3 F (37.4 C), Min:99.1 F (37.3 C), Max:99.4 F (37.4 C)  Recent Labs  Lab 04/08/21 0219 04/08/21 0343  WBC 11.7*  --   CREATININE 3.28*  --   LATICACIDVEN  --  3.7*    Estimated Creatinine Clearance: 25 mL/min (A) (by C-G formula based on SCr of 3.28 mg/dL (H)).    Allergies  Allergen Reactions   Penicillins Nausea Only    Occurred with amoxicillin    Sulfamethoxazole Nausea Only and Rash    Antimicrobials this admission: Vancomycin 8/18 x 1 Cefepime 8/18 >>  Metronidazole 8/18 >>   Dose adjustments this admission: N/A  Microbiology results: 8/18 BCx: pending 8/18 UCx: pending   Thank you for allowing pharmacy to be a part of this patient's care.  Benita Gutter 04/08/2021 5:21 AM

## 2021-04-08 NOTE — Progress Notes (Signed)
[facility] Progress Note Patient Name: AXSEL MIKLE DOB: April 20, 1961 MRN: [id number]   Date of Service  04/08/2021  HPI/Events of Note  Patient admitted to the ICU with suspected septic shock from ischemic vs infectious colitis, GI bleeding, AKI and DKA, hemoglobin is however stable.  eICU Interventions  New Patient Evaluation.        Frederik Pear 04/08/2021, 6:49 AM
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
## 2021-04-08 NOTE — Anesthesia Procedure Notes (Signed)
Procedure Name: Intubation Date/Time: 04/08/2021 9:07 AM Performed by: Lily Peer, Kiyo Heal, CRNA Pre-anesthesia Checklist: Patient identified, Emergency Drugs available, Suction available and Patient being monitored Patient Re-evaluated:Patient Re-evaluated prior to induction Oxygen Delivery Method: Circle system utilized Preoxygenation: Pre-oxygenation with 100% oxygen Induction Type: IV induction and Rapid sequence Laryngoscope Size: McGraph and 4 Grade View: Grade I Tube type: Oral Number of attempts: 1 Airway Equipment and Method: Stylet Placement Confirmation: ETT inserted through vocal cords under direct vision, positive ETCO2 and breath sounds checked- equal and bilateral Secured at: 22 cm Tube secured with: Tape Dental Injury: Teeth and Oropharynx as per pre-operative assessment

## 2021-04-08 NOTE — Anesthesia Procedure Notes (Signed)
Arterial Line Insertion Start/End8/18/2022 9:31 AM, 04/08/2021 9:31 AM Performed by: Arita Miss, MD, anesthesiologist  Patient location: OR. Preanesthetic checklist: patient identified, IV checked, site marked, risks and benefits discussed, surgical consent, monitors and equipment checked, pre-op evaluation and anesthesia consent Lidocaine 1% used for infiltration and patient sedated Left, radial was placed Catheter size: 20 G Hand hygiene performed  and maximum sterile barriers used   Attempts: 1 Procedure performed without using ultrasound guided technique. Following insertion, dressing applied. Post procedure assessment: normal and unchanged

## 2021-04-08 NOTE — Transfer of Care (Signed)
Immediate Anesthesia Transfer of Care Note  Patient: Patrick Elliott  Procedure(s) Performed: LAPAROSCOPY DIAGNOSTIC (Abdomen) COLON RESECTION SIGMOID (Abdomen) OSTOMY CREATION (Abdomen)  Patient Location: ICU  Anesthesia Type:General  Level of Consciousness: awake, drowsy and patient cooperative  Airway & Oxygen Therapy: Patient Spontanous Breathing and Patient connected to face mask oxygen  Post-op Assessment: Report given to RN and Post -op Vital signs reviewed and stable  Post vital signs: Reviewed and stable  Last Vitals:  Vitals Value Taken Time  BP    Temp    Pulse    Resp    SpO2      Last Pain:  Vitals:   04/08/21 0830  TempSrc: Oral  PainSc:          Complications: No notable events documented.

## 2021-04-08 NOTE — ED Notes (Signed)
Pt in CT.

## 2021-04-08 NOTE — ED Triage Notes (Signed)
Pt says for 24 hours he has "pinkish" tinged vomiting, bright red stools, feels weak. Pale, hypotensive in triage. Also his sugars have been in the 200-250's.

## 2021-04-08 NOTE — Sepsis Progress Note (Signed)
Monitoring for code sepsis protocol. 

## 2021-04-08 NOTE — ED Notes (Signed)
Lab called about add on. 

## 2021-04-08 NOTE — Progress Notes (Signed)
Pt was taken to the OR while FFP was infusing, and before the 15 minute vital recheck time.  FFP finished while in OR.
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
## 2021-04-08 NOTE — Consult Note (Signed)
PHARMACY CONSULT NOTE  Pharmacy Consult for Electrolyte Monitoring and Replacement   Recent Labs: Potassium  Date Value  04/08/2021 5.0 mmol/L  04/25/2012 4.2 mEq/L   Magnesium (mg/dL)  Date Value  06/16/2010 1.8   Calcium (mg/dL)  Date Value  04/08/2021 8.4 (L)  04/25/2012 9.3   Albumin (g/dL)  Date Value  04/08/2021 3.5  04/16/2019 4.3  04/25/2012 4.1   Sodium  Date Value  04/08/2021 123 mmol/L (L)  03/06/2020 136 mmol/L  04/25/2012 134 mEq/L (L)   Assessment: Patient is a 60 y/o M with medical history including diabetes, alcohol abuse, HTN, HLD, bipolar disorder, ADHD, anxiety, peripheral neuropathy, diastolic HF who is admitted with sepsis. Pharmacy consulted to assist with electrolyte monitoring and replacement as indicated.   Patient is on an insulin infusion  Goal of Therapy:  Electrolytes within normal limits K+ > 4 while on insulin infusion  Plan:  --No electrolyte replacement warranted at this time --With AKI, on fluid resuscitation --Continue to follow along  Benita Gutter  04/08/2021 5:26 AM
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
## 2021-04-08 NOTE — Consult Note (Signed)
PHARMACY -  BRIEF ANTIBIOTIC NOTE   Pharmacy has received consult(s) for vancomycin and aztreonam from an ED provider. Patient is also ordered metronidazole. The patient's profile has been reviewed for ht/wt/allergies/indication/available labs.    Allergy history reviewed. PCN allergy documented as nausea only which occurred with amoxicillin. Will change aztreonam to cefepime  One time order(s) placed for  --Cefepime 2 g IV --Vancomycin 1 g IV  Further antibiotics/pharmacy consults should be ordered by admitting physician if indicated.                       Thank you, Benita Gutter 04/08/2021  3:13 AM

## 2021-04-09 ENCOUNTER — Encounter: Payer: Self-pay | Admitting: Surgery

## 2021-04-09 DIAGNOSIS — K631 Perforation of intestine (nontraumatic): Secondary | ICD-10-CM

## 2021-04-09 DIAGNOSIS — A419 Sepsis, unspecified organism: Secondary | ICD-10-CM | POA: Diagnosis not present

## 2021-04-09 DIAGNOSIS — Z7189 Other specified counseling: Secondary | ICD-10-CM | POA: Diagnosis not present

## 2021-04-09 DIAGNOSIS — E1110 Type 2 diabetes mellitus with ketoacidosis without coma: Secondary | ICD-10-CM | POA: Diagnosis not present

## 2021-04-09 DIAGNOSIS — K559 Vascular disorder of intestine, unspecified: Secondary | ICD-10-CM

## 2021-04-09 DIAGNOSIS — N179 Acute kidney failure, unspecified: Secondary | ICD-10-CM

## 2021-04-09 DIAGNOSIS — E872 Acidosis: Secondary | ICD-10-CM | POA: Diagnosis not present

## 2021-04-09 DIAGNOSIS — Z515 Encounter for palliative care: Secondary | ICD-10-CM | POA: Diagnosis not present

## 2021-04-09 DIAGNOSIS — B999 Unspecified infectious disease: Secondary | ICD-10-CM

## 2021-04-09 DIAGNOSIS — R6521 Severe sepsis with septic shock: Secondary | ICD-10-CM

## 2021-04-09 LAB — URINE CULTURE: Culture: <10000 — AB

## 2021-04-09 LAB — HEPATITIS PANEL, ACUTE
HCV Ab: NONREACTIVE
Hep A IgM: NONREACTIVE
Hep B C IgM: NONREACTIVE
Hepatitis B Surface Ag: NONREACTIVE

## 2021-04-09 LAB — BPAM FFP
Blood Product Expiration Date: 202208232359
ISSUE DATE / TIME: 202208180833
Unit Type and Rh: 6200

## 2021-04-09 LAB — SURGICAL PATHOLOGY

## 2021-04-09 LAB — BASIC METABOLIC PANEL
Anion gap: 6 (ref 5–15)
BUN: 25 mg/dL — ABNORMAL HIGH (ref 8–23)
CO2: 19 mmol/L — ABNORMAL LOW (ref 22–32)
Calcium: 7.5 mg/dL — ABNORMAL LOW (ref 8.9–10.3)
Chloride: 111 mmol/L (ref 98–111)
Creatinine, Ser: 1.13 mg/dL (ref 0.61–1.24)
GFR, Estimated: >60 mL/min (ref >60)
Glucose, Bld: 127 mg/dL — ABNORMAL HIGH (ref 70–99)
Potassium: 3.9 mmol/L (ref 3.5–5.1)
Sodium: 136 mmol/L (ref 135–145)

## 2021-04-09 LAB — GLUCOSE, CAPILLARY
Glucose-Capillary: 112 mg/dL — ABNORMAL HIGH (ref 70–99)
Glucose-Capillary: 114 mg/dL — ABNORMAL HIGH (ref 70–99)
Glucose-Capillary: 116 mg/dL — ABNORMAL HIGH (ref 70–99)
Glucose-Capillary: 122 mg/dL — ABNORMAL HIGH (ref 70–99)
Glucose-Capillary: 124 mg/dL — ABNORMAL HIGH (ref 70–99)
Glucose-Capillary: 132 mg/dL — ABNORMAL HIGH (ref 70–99)

## 2021-04-09 LAB — CBC
HCT: 35.2 % — ABNORMAL LOW (ref 39.0–52.0)
Hemoglobin: 12.1 g/dL — ABNORMAL LOW (ref 13.0–17.0)
MCH: 29 pg (ref 26.0–34.0)
MCHC: 34.4 g/dL (ref 30.0–36.0)
MCV: 84.4 fL (ref 80.0–100.0)
Platelets: 61 10*3/uL — ABNORMAL LOW (ref 150–400)
RBC: 4.17 MIL/uL — ABNORMAL LOW (ref 4.22–5.81)
RDW: 15.1 % (ref 11.5–15.5)
WBC: 4.1 10*3/uL (ref 4.0–10.5)
nRBC: 0 % (ref 0.0–0.2)

## 2021-04-09 LAB — BLOOD GAS, ARTERIAL
Acid-base deficit: 5.1 mmol/L — ABNORMAL HIGH (ref 0.0–2.0)
Bicarbonate: 18.6 mmol/L — ABNORMAL LOW (ref 20.0–28.0)
FIO2: 0.21
O2 Saturation: 94.5 %
Patient temperature: 37
pCO2 arterial: 30 mmHg — ABNORMAL LOW (ref 32.0–48.0)
pH, Arterial: 7.4 (ref 7.350–7.450)
pO2, Arterial: 73 mmHg — ABNORMAL LOW (ref 83.0–108.0)

## 2021-04-09 LAB — PROCALCITONIN: Procalcitonin: 22.61 ng/mL

## 2021-04-09 LAB — PREPARE FRESH FROZEN PLASMA

## 2021-04-09 LAB — PHOSPHORUS: Phosphorus: 1.7 mg/dL — ABNORMAL LOW (ref 2.5–4.6)

## 2021-04-09 LAB — MAGNESIUM: Magnesium: 2.1 mg/dL (ref 1.7–2.4)

## 2021-04-09 MED ORDER — SODIUM PHOSPHATES 45 MMOLE/15ML IV SOLN
30.0000 mmol | Freq: Once | INTRAVENOUS | Status: AC
  Administered 2021-04-09: 30 mmol via INTRAVENOUS
  Filled 2021-04-09: qty 10

## 2021-04-09 MED ORDER — POLYVINYL ALCOHOL 1.4 % OP SOLN
1.0000 [drp] | OPHTHALMIC | Status: DC | PRN
  Administered 2021-04-10: 1 [drp] via OPHTHALMIC
  Filled 2021-04-09: qty 15

## 2021-04-09 MED ORDER — VANCOMYCIN HCL 1750 MG/350ML IV SOLN
1750.0000 mg | INTRAVENOUS | Status: DC
  Administered 2021-04-09: 1750 mg via INTRAVENOUS
  Filled 2021-04-09 (×2): qty 350

## 2021-04-09 MED ORDER — SODIUM CHLORIDE 0.9 % IV SOLN
2.0000 g | Freq: Three times a day (TID) | INTRAVENOUS | Status: DC
  Administered 2021-04-09: 2 g via INTRAVENOUS
  Filled 2021-04-09 (×3): qty 2

## 2021-04-09 MED ORDER — PIPERACILLIN-TAZOBACTAM 3.375 G IVPB
3.3750 g | Freq: Three times a day (TID) | INTRAVENOUS | Status: DC
  Administered 2021-04-09 – 2021-04-12 (×9): 3.375 g via INTRAVENOUS
  Filled 2021-04-09 (×9): qty 50

## 2021-04-09 MED ORDER — PROPOFOL 500 MG/50ML IV EMUL
INTRAVENOUS | Status: AC
  Filled 2021-04-09: qty 400

## 2021-04-09 MED ORDER — PANTOPRAZOLE SODIUM 40 MG IV SOLR
40.0000 mg | INTRAVENOUS | Status: DC
  Administered 2021-04-10 – 2021-04-12 (×3): 40 mg via INTRAVENOUS
  Filled 2021-04-09 (×3): qty 40

## 2021-04-09 MED ORDER — PHENYLEPHRINE HCL (PRESSORS) 10 MG/ML IV SOLN
INTRAVENOUS | Status: AC
  Filled 2021-04-09: qty 1

## 2021-04-09 MED ORDER — SODIUM CHLORIDE 0.9 % IV SOLN
100.0000 mg | INTRAVENOUS | Status: DC
  Administered 2021-04-10 – 2021-04-12 (×3): 100 mg via INTRAVENOUS
  Filled 2021-04-09 (×3): qty 100

## 2021-04-09 MED ORDER — SODIUM BICARBONATE 8.4 % IV SOLN
50.0000 meq | Freq: Once | INTRAVENOUS | Status: AC
  Administered 2021-04-09: 50 meq via INTRAVENOUS
  Filled 2021-04-09: qty 50

## 2021-04-09 MED ORDER — SODIUM CHLORIDE 0.9 % IV SOLN
200.0000 mg | Freq: Once | INTRAVENOUS | Status: AC
  Administered 2021-04-09: 200 mg via INTRAVENOUS
  Filled 2021-04-09: qty 200

## 2021-04-09 NOTE — Consult Note (Signed)
Consultation Note Date: 04/09/2021   Patient Name: Patrick Elliott  DOB: [birth year]  MRN: [id number]  Age / Sex: 60 y.o., male  PCP: Lattie Haw, MD Referring Physician: Flora Lipps, MD  Reason for Consultation: Establishing goals of care  HPI/Patient Profile: 60 y.o. 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   Clinical Assessment and Goals of Care: I have reviewed medical records including EPIC notes, labs and imaging, received report from RN, assessed the patient.  Patrick Elliott is lying quietly in bed.  He appears acutely ill.  He greets me, making and mostly keeping eye contact.  He is alert and oriented able to make his basic needs known.  Bedside nursing staff is present attending to needs.  There is no family at bedside at this time.    I introduced Palliative Medicine as specialized medical care for people living with serious illness. It focuses on providing relief from the symptoms and stress of a serious illness. The goal is to improve quality of life for both the patient and the family.  We discussed a brief life review.  Patrick Elliott tells me that he has been disabled for several years.  He lives alone and independently.  He has a girlfriend, Carlyon Shadow, but they do not live together.  He is divorced, has no children, has parents are in their 17s, he tells me he has no siblings.  We then focused on their current illness.  Patrick Elliott is able to somewhat accurately tell me what has happened.  He no longer looks toxic.  We talked about time for outcomes.   Advanced directives, concepts specific to code status, artifical feeding  and hydration, and rehospitalization were considered and discussed.  At this point Patrick Elliott tells me that he would want life support, but not long-term.  He shares that 15 years ago he was on life support for over 30 days.  He states he would not want a tracheostomy.  Discussed the importance of continued conversation with family and the medical providers regarding overall plan of care and treatment options, ensuring decisions are within the context of the patient's values and GOCs.  Questions and concerns were addressed.  The family was encouraged to call with questions or concerns.  PMT will continue to support holistically.  Conference with attending, bedside nursing staff, transition of care team related to patient condition, needs, goals of care.   HCPOA   OTHER -Patrick Elliott tells me that he would like for significant other, Carlyon Shadow, to be his healthcare surrogate.  He shares that he is divorced, has no children and his parents are in their 69s.  He also tells me that he has no siblings.  I share that I will request chaplain service to come and assist with completing [facility] POA  paperwork.    SUMMARY OF RECOMMENDATIONS   At this point full scope/full code Set limits for no tracheostomy Time for outcomes   Code Status/Advance Care Planning: Full code -tells me he would not want long-term vent support, neck tracheostomy  Symptom Management:  Per CCM/surgery, no additional needs at this time.  Palliative Prophylaxis:  Frequent Pain Assessment, Oral Care, and Turn Reposition  Additional Recommendations (Limitations, Scope, Preferences): Full Scope Treatment  Psycho-social/Spiritual:  Desire for further Chaplaincy support:yes Additional Recommendations: Caregiving  Support/Resources  Prognosis:  Unable to determine, based on outcomes.  Improving.  Discharge Planning:  To be determined, based on outcomes.  Would likely benefit from short-term rehab, complicated by Medicaid only.        Primary Diagnoses: Present on Admission:  Severe sepsis with lactic acidosis ([place])   I have reviewed the medical record, interviewed the patient and family, and examined the patient. The following aspects are pertinent.  Past Medical History:  Diagnosis Date   Anemia    CHF (congestive heart failure) (HCC)    DIASTOLIC   Cirrhosis (HCC)    Diabetes mellitus    Dizziness    Hypertension    Syncope and collapse    Tachycardia    Social History   Socioeconomic History   Marital status: Divorced    Spouse name: Not on file   Number of children: Not on file   Years of education: Not on file   Highest education level: Not on file  Occupational History   Not on file  Tobacco Use   Smoking status: Former    Types: Cigarettes    Quit date: 04/22/2006    Years since quitting: 14.9   Smokeless tobacco: Never  Substance and Sexual Activity   Alcohol use: No   Drug use: No   Sexual activity: Not on file  Other Topics Concern   Not on file  Social History Narrative   Not on file   Social Determinants of Health   Financial Resource Strain: Not on file  Food Insecurity: Not on file  Transportation Needs: Not on file  Physical Activity: Not on file  Stress: Not on file  Social Connections: Not on file   Family History  Problem Relation Age of Onset   Hypertension Father    Scheduled Meds:  sodium chloride   Intravenous Once   Chlorhexidine Gluconate Cloth  6 each Topical Q0600   insulin aspart  3-9 Units Subcutaneous Q4H   insulin detemir  10 Units Subcutaneous Daily   [START ON 04/10/2021] pantoprazole (PROTONIX) IV  40 mg Intravenous Q24H   Continuous Infusions:  sodium chloride Stopped (04/08/21 0703)   anidulafungin 200 mg (04/09/21 1058)   Followed by   Derrill Memo ON 04/10/2021] anidulafungin     piperacillin-tazobactam (ZOSYN)  IV     sodium phosphate  Dextrose 5% IVPB     vancomycin 1,750 mg (04/09/21 1234)   PRN Meds:.dextrose, docusate sodium,  fentaNYL (SUBLIMAZE) injection, polyethylene glycol, polyvinyl alcohol Medications Prior to Admission:  Prior to Admission medications   Medication Sig Start Date End Date Taking? Authorizing Provider  Accu-Chek FastClix Lancets MISC USE AS DIRECTED UP TO 5 TIMES A DAY 07/14/20  Yes Lurline Del, DO  ACCU-CHEK GUIDE test strip USE AS INSTRUCTED 5 TIMES DAILY 10/19/20  Yes Lattie Haw, MD  BD PEN NEEDLE NANO 2ND GEN 32G X 4 MM MISC USE AS DIRECTED TWICE A DAY WITH BYETTA 12/02/20  Yes Lattie Haw, MD  Blood Glucose Monitoring  Suppl KIT 1 each as directed by Does not apply route. 07/05/17  Yes Orson Eva J, DO  BOTOX 100 units SOLR injection Inject 1 each into the skin as needed. 05/05/20  Yes [provider]  carvedilol (COREG) 6.25 MG tablet TAKE 1 TABLET BY MOUTH TWICE A DAY WITH MEALS 01/18/21  Yes Lattie Haw, MD  Continuous Blood Gluc Sensor (DEXCOM G6 SENSOR) MISC USE 1 SENSOR EVERY 10 DAYS 02/16/21  Yes Lattie Haw, MD  Continuous Blood Gluc Transmit (DEXCOM G6 TRANSMITTER) MISC USE AS DIRECTED 02/16/21  Yes Lattie Haw, MD  insulin aspart (FIASP FLEXTOUCH) 100 UNIT/ML FlexTouch Pen Inject 16-20 Units into the skin 2 (two) times daily before a meal. And 12-14 units into the skin daily for snacks Patient taking differently: Inject 16 Units into the skin 2 (two) times daily before a meal. Per sliding scale 04/01/21  Yes Hensel, Jamal Collin, MD  insulin glargine (LANTUS SOLOSTAR) 100 UNIT/ML Solostar Pen INJECT 42 UNITS INTO THE SKIN DAILY. 04/01/21  Yes Hensel, Jamal Collin, MD  JARDIANCE 25 MG TABS tablet TAKE 1 TABLET BY MOUTH EVERY DAY 01/11/21  Yes Brimage, Vondra, DO  lamoTRIgine (LAMICTAL) 25 MG tablet TAKE 4 TABLETS (100 MG TOTAL) BY MOUTH DAILY. 03/21/18  Yes Bufford Lope, DO  Lancets Misc. (ACCU-CHEK FASTCLIX LANCET) KIT As directed up to 4 times a day 10/20/20  Yes Chambliss, Jeb Levering, MD  lisdexamfetamine (VYVANSE) 70 MG capsule Take 1 capsule (70 mg total) by mouth daily. Per  Dr. Tammi Klippel.  Scripts written for 2/21 and 3/21.  Due for refills 4/21. 12/24/19  Yes Lattie Haw, MD  lisinopril (ZESTRIL) 20 MG tablet Take 0.5 tablets (10 mg total) by mouth daily. 02/11/21  Yes Hensel, Jamal Collin, MD  lithium carbonate (ESKALITH) 450 MG CR tablet TAKE 1 TABLET BY MOUTH EVERY DAY WITH FOOD 06/06/16  Yes Haney, Alyssa A, MD  metFORMIN (GLUCOPHAGE) 500 MG tablet TAKE 1 TABLET (500 MG TOTAL) BY MOUTH 2 (TWO) TIMES DAILY WITH A MEAL. 07/20/20  Yes Lattie Haw, MD  methocarbamol (ROBAXIN) 500 MG tablet TAKE 1 TABLET BY MOUTH EVERY 8 HOURS AS NEEDED FOR MUSCLE SPASMS 02/01/21  Yes Lattie Haw, MD  methylphenidate (RITALIN) 10 MG tablet Take 1 tablet (10 mg total) by mouth daily. 12/24/19  Yes Lattie Haw, MD  Multiple Vitamin (MULTIVITAMIN) capsule Take 1 capsule by mouth daily.     Yes [provider]  nortriptyline (PAMELOR) 50 MG capsule Take 50 mg by mouth at bedtime. 05/05/19  Yes [provider]  omeprazole (PRILOSEC) 20 MG capsule TAKE 1 CAPSULE BY MOUTH EVERY DAY 10/01/20  Yes Lattie Haw, MD  pregabalin (LYRICA) 100 MG capsule TAKE 1 CAPSULE BY MOUTH THREE TIMES A DAY 04/01/21  Yes Lattie Haw, MD  RESTASIS 0.05 % ophthalmic emulsion Place 1 drop into both eyes 2 (two) times daily. 04/29/19  Yes [provider]  simvastatin (ZOCOR) 20 MG tablet TAKE 1 TABLET BY MOUTH EVERY DAY 07/20/20  Yes Lattie Haw, MD  Glucagon (GVOKE HYPOPEN 2-PACK) 0.5 MG/0.1ML SOAJ Inject 0.5 mg into the skin as needed. 11/09/20   Lattie Haw, MD  tadalafil (CIALIS) 20 MG tablet Take 5 mg by mouth as needed.    [provider]  zolpidem (AMBIEN) 10 MG tablet Take 1 tablet (10 mg total) by mouth daily. 12/24/19   Lattie Haw, MD  HUMALOG 100 UNIT/ML injection INJECT 2-5 UNITS INTO THE SKIN 3 TIMES A DAY BEFORE MEALS  04/29/14  Hairford,  Tyler Pita, MD   Allergies  Allergen Reactions   Penicillins Nausea Only    Occurred with amoxicillin  Tolerated cephalosporins  before   Sulfamethoxazole Nausea Only and Rash   Review of Systems  Unable to perform ROS: Acuity of condition   Physical Exam Vitals and nursing note reviewed.  Cardiovascular:     Rate and Rhythm: Normal rate.  Pulmonary:     Effort: Pulmonary effort is normal. No respiratory distress.  Skin:    General: Skin is warm and dry.  Neurological:     Mental Status: He is alert and oriented to person, place, and time.  Psychiatric:        Mood and Affect: Mood normal.        Behavior: Behavior normal.    Vital Signs: BP 134/71   Pulse 98   Temp 98.4 F (36.9 C) (Axillary)   Resp 18   Wt 87.6 kg   SpO2 95%   BMI 30.25 kg/m  Pain Scale: 0-10   Pain Score: Asleep   SpO2: SpO2: 95 % O2 Device:SpO2: 95 % O2 Flow Rate: .O2 Flow Rate (L/min): 0 L/min  IO: Intake/output summary:  Intake/Output Summary (Last 24 hours) at 04/09/2021 1251 Last data filed at 04/09/2021 0518 Gross per 24 hour  Intake 5054.61 ml  Output 6395 ml  Net -1340.39 ml    LBM: Last BM Date: 04/09/21 Baseline Weight: Weight: 87.9 kg Most recent weight: Weight: 87.6 kg     Palliative Assessment/Data:   Flowsheet Rows    Flowsheet Row Most Recent Value  Intake Tab   Referral Department Hospitalist  Unit at Time of Referral ICU  Palliative Care Primary Diagnosis Other (Comment)  Date Notified 04/08/21  Palliative Care Type New Palliative care  Reason for referral Clarify Goals of Care  Date of Admission 04/08/21  Date first seen by Palliative Care 04/09/21  # of days Palliative referral response time 1 Day(s)  # of days IP prior to Palliative referral 0  Clinical Assessment   Palliative Performance Scale Score 40%  Pain Max last 24 hours Not able to report  Pain Min Last 24 hours Not able to report  Dyspnea Max Last 24 Hours Not able to report  Dyspnea Min Last 24 hours Not able to report  Nausea Max Last 24 Hours Not able to report  Psychosocial & Spiritual Assessment   Palliative Care  Outcomes        Time In: 0930  Time Out: 1020 Time Total: 50 minutes  Greater than 50%  of this time was spent counseling and coordinating care related to the above assessment and plan.  Signed by: Drue Novel, NP   Please contact Palliative Medicine Team phone at [phone] for questions and concerns.  For individual provider: See Shea Evans

## 2021-04-09 NOTE — Progress Notes (Signed)
Chaplain Maggie made initial visit with patient and his partner, [name], at bedside. Advanced Directive education was shared and paperwork left for completion. A question was raised about AD paperwork the patient began to complete on 04/08/21 just prior to being quickly taken to surgery. Chaplain was unable to locate the forms and provided a fresh copy for completion. Contact on call chaplain at [phone] to organize notary and witness signatures.

## 2021-04-09 NOTE — Progress Notes (Signed)
HIV prelim test reactive- confirmatory test pending ( neg HIV in 2018) . Await confirmatory to decide on further management
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
## 2021-04-09 NOTE — Consult Note (Signed)
Pharmacy Antibiotic Note  Patrick Elliott is a 60 y.o. male with medical history including diabetes, alcohol abuse, HTN, HLD, bipolar disorder, ADHD, anxiety, peripheral neuropathy, diastolic HF admitted on 0000000 with sepsis. Patient taken to the OR 8/18 for colon perforation and ischemia, s/p Hartmann's procedure. Pharmacy has been consulted for vancomycin and Zosyn dosing. Patient is also on Eraxis. ID has been consulted.  Plan: Vancomycin 1750 mg IV q24h Goal AUC 400-550 Expected AUC: 481 SCr used: 1.13  Zosyn 3.375 g IV q8h extended infusion   Weight: 87.6 kg (193 lb 2 oz)  Temp (24hrs), Avg:98.8 F (37.1 C), Min:98.4 F (36.9 C), Max:99.2 F (37.3 C)  Recent Labs  Lab 04/08/21 0219 04/08/21 0343 04/08/21 0538 04/08/21 1438 04/08/21 1706 04/09/21 0410  WBC 11.7*  --   --  9.0  --  4.1  CREATININE 3.28*  --   --  1.72* 1.58* 1.13  LATICACIDVEN  --  3.7* 3.1* 1.6  --   --      Estimated Creatinine Clearance: 72.5 mL/min (by C-G formula based on SCr of 1.13 mg/dL).    Allergies  Allergen Reactions   Penicillins Nausea Only    Occurred with amoxicillin  Tolerated cephalosporins before   Sulfamethoxazole Nausea Only and Rash    Antimicrobials this admission: Cefepime 8/18 >> 8/19 Metronidazole 8/18 >> 8/19 Vancomycin 8/18 >> Zosyn 8/19 >> Eraxis 8/19 >>   Microbiology results: 8/18 Peritoneal Cx: NG, pending 8/18 BCx: NGTD 8/18 UCx: insignificant growth  8/18 MRSA PCR: (-)  Thank you for allowing pharmacy to be a part of this patient's care.  Tawnya Crook, PharmD, BCPS Clinical Pharmacist 04/09/2021 11:31 AM

## 2021-04-09 NOTE — Progress Notes (Signed)
Initial Nutrition Assessment  DOCUMENTATION CODES:   Not applicable  INTERVENTION:   If tube feeds initiated, recommend:  Osmolite 1.5 _0 /hr- Initiate at 39m/hr and increase by 138mhr q 8 hours until goal rate is reached.   Pro-Source 4519mID via tube, provides 40kcal and 11g of protein per serving   Free water flushes 5m67m hours to maintain tube patency   Regimen provides 2420kcal/day, 120g/day protein and 1369ml52m free water  Pt at high refeed risk; recommend monitor potassium, magnesium and phosphorus labs daily until stable  NUTRITION DIAGNOSIS:   Inadequate oral intake related to acute illness as evidenced by NPO status.  GOAL:   Patient will meet greater than or equal to 90% of their needs  MONITOR:   Diet advancement, Weight trends, Skin, I & O's, Labs  REASON FOR ASSESSMENT:   Rounds    ASSESSMENT:   60 y/48male with h/o etoh abuse, bipolar disorder, anxiety, ADHD, HTN, GERD, DM, CHF and cirrhosis who is admitted with colon perforation now s/p Hartmann's 8/18. Pt also found to be HIV positive.  Met with pt in room today. Pt reports fair appetite and oral intake at baseline. Pt reports that he generally eats about two meals per day. Pt reports poor appetite and oral intake for 1 day pta r/t nausea, vomiting and abdominal pain. Pt denies any nausea today but reports pain around his incision and ostomy site mainly when he moves. Pt does appear to have some distension in his abdomen on exam today. Pt with NGT to LIS with zero output. Per surgery, no tube feed initiation today. Pt would like to have vanilla Ensure once his diet advances. Pt is at high refeed risk.   Per chart, pt appears weight stable at baseline. Pt reports his UBW is ~182-186lbs.   Medications reviewed and include: insulin, protonix, zosyn, vancomycin   Labs reviewed: K 3.9 wnl, BUN 25(H), P 1.7(L), Mg 2.1 wnl Cbgs- 129, 157, 112, 116 x 24 hrs AIC 7.4- 09/2020  NUTRITION - FOCUSED  PHYSICAL EXAM:  Flowsheet Row Most Recent Value  Orbital Region No depletion  Upper Arm Region Mild depletion  Thoracic and Lumbar Region No depletion  Buccal Region No depletion  Temple Region No depletion  Clavicle Bone Region Severe depletion  Clavicle and Acromion Bone Region Severe depletion  Scapular Bone Region No depletion  Dorsal Hand No depletion  Patellar Region Mild depletion  Anterior Thigh Region Mild depletion  Posterior Calf Region Mild depletion  Edema (RD Assessment) None  Hair Reviewed  Eyes Reviewed  Mouth Reviewed  Skin Reviewed  Nails Reviewed   Diet Order:   Diet Order             Diet NPO time specified  Diet effective now                  EDUCATION NEEDS:   Education needs have been addressed  Skin:  Skin Assessment: Reviewed RN Assessment (incision abdomen)  Last BM:  8/19- 51ml 58mostomy  Height:   Ht Readings from Last 1 Encounters:  03/18/21 _1  (1.702 m)    Weight:   Wt Readings from Last 1 Encounters:  04/09/21 87.6 kg    Ideal Body Weight:  67.27 kg  BMI:  Body mass index is 30.25 kg/m.  Estimated Nutritional Needs:   Kcal:  2100-2400kcal/day  Protein:  105-120g/day  Fluid:  1.7-2.0L/day  Patrick Elliott Patrick DistanceD, LDN Please refer to [facility] and/or RD on-call/weekend/after  hours pager
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
## 2021-04-09 NOTE — Anesthesia Postprocedure Evaluation (Signed)
Anesthesia Post Note  Patient: Patrick Elliott  Procedure(s) Performed: LAPAROSCOPY DIAGNOSTIC (Abdomen) COLON RESECTION SIGMOID (Abdomen) OSTOMY CREATION (Abdomen)  Patient location during evaluation: ICU Anesthesia Type: General Level of consciousness: sedated Vital Signs Assessment: post-procedure vital signs reviewed and stable Respiratory status: spontaneous breathing Cardiovascular status: stable Anesthetic complications: no   No notable events documented.   Last Vitals:  Vitals:   04/09/21 0600 04/09/21 0700  BP: (!) 145/68 134/71  Pulse: 98   Resp: 16 18  Temp:    SpO2: 95%     Last Pain:  Vitals:   04/09/21 0501  TempSrc:   PainSc: Asleep                 Lerry Liner

## 2021-04-09 NOTE — Consult Note (Signed)
PHARMACY CONSULT NOTE  Pharmacy Consult for Electrolyte Monitoring and Replacement   Recent Labs: Potassium  Date Value  04/09/2021 3.9 mmol/L  04/25/2012 4.2 mEq/L   Magnesium (mg/dL)  Date Value  04/09/2021 2.1   Calcium (mg/dL)  Date Value  04/09/2021 7.5 (L)  04/25/2012 9.3   Albumin (g/dL)  Date Value  04/08/2021 2.8 (L)  04/16/2019 4.3  04/25/2012 4.1   Phosphorus (mg/dL)  Date Value  04/09/2021 1.7 (L)   Sodium  Date Value  04/09/2021 136 mmol/L  03/06/2020 136 mmol/L  04/25/2012 134 mEq/L (L)   Assessment: Patient is a 60 y/o M with medical history including diabetes, alcohol abuse, HTN, HLD, bipolar disorder, ADHD, anxiety, peripheral neuropathy, diastolic HF who is admitted with sepsis. Patient taken to OR 8/18 for colon perforation and ischemia, s/p Hartmann's procedure. Pharmacy consulted to assist with electrolyte monitoring and replacement as indicated.    Goal of Therapy:  Electrolytes within normal limits  Plan:  --Sodium phos 30 mmol IV x 1 --Continue to follow along. Recheck with AM labs  Tawnya Crook, PharmD, BCPS Clinical Pharmacist 04/09/2021 11:40 AM

## 2021-04-09 NOTE — Consult Note (Signed)
[place] Nurse ostomy follow up Patient receiving care in [facility] ICU 14.  Patient too sleepy to participate in teaching today. Stoma type/location: LUQ colostomy Stomal assessment/size: 1.5 inches round, moist, red, slightly budded, sutures intact Peristomal assessment: intact Treatment options for stomal/peristomal skin: barrier ring Output: drops of brown liquid in existing pouch plus gas. Ostomy pouching: 2pc. 2 and 1/4 inch flat.  Patient uses Pouch Lawson #[id number]; skin barrier Kellie Simmering [phone]; and barrier ring Kellie Simmering [phone].  You will need equal numbers of each. And, this is for the 2 and 1/4 inch size. Education provided: none Enrolled patient in [employer] Discharge program: No   Additional supplies ordered by [place]. Pattern left at the bedside.  Val Riles, RN, MSN, CWOCN, CNS-BC, pager [phone]

## 2021-04-09 NOTE — Progress Notes (Signed)
Subjective:  CC: Patrick Elliott is a 60 y.o. male  Hospital stay day 1, 1 Day Post-Op hartman's  HPI: No acute issues overnight.  Gas and stool in ostomy bag now, pain controlled.  ROS:  General: Denies weight loss, weight gain, fatigue, fevers, chills, and night sweats. Heart: Denies chest pain, palpitations, racing heart, irregular heartbeat, leg pain or swelling, and decreased activity tolerance. Respiratory: Denies breathing difficulty, shortness of breath, wheezing, cough, and sputum. GI: Denies change in appetite, heartburn, nausea, vomiting, constipation, diarrhea, and blood in stool. GU: Denies difficulty urinating, pain with urinating, urgency, frequency, blood in urine.   Objective:   Temp:  [98.4 F (36.9 C)-99.2 F (37.3 C)] 98.4 F (36.9 C) (08/19 0400) Pulse Rate:  [73-98] 98 (08/19 0600) Resp:  [13-26] 18 (08/19 0700) BP: (109-147)/(52-73) 134/71 (08/19 0700) SpO2:  [95 %-100 %] 95 % (08/19 0600) Arterial Line BP: (101-165)/(41-63) 149/62 (08/19 0700) Weight:  [87.6 kg] 87.6 kg (08/19 0455)       Weight: 87.6 kg BMI (Calculated): 30.24   Intake/Output this shift:   Intake/Output Summary (Last 24 hours) at 04/09/2021 1350 Last data filed at 04/09/2021 [id number] Gross per 24 hour  Intake 4512.61 ml  Output 6395 ml  Net -1882.39 ml    Constitutional :  alert, cooperative, appears stated age, and no distress  Respiratory:  clear to auscultation bilaterally  Cardiovascular:  regular rate and rhythm  Gastrointestinal: Soft, no guarding, some TTP around incision.  Ostomy with gas, mucosa dusky .   Skin: Cool and moist. Staples C/D/I. JP with serosanguinous outpt. NG in place  Psychiatric: Normal affect, non-agitated, not confused       LABS:  CMP Latest Ref Rng & Units 04/09/2021 04/08/2021 04/08/2021  Glucose 70 - 99 mg/dL 127(H) 175(H) 195(H)  BUN 8 - 23 mg/dL 25(H) 38(H) 42(H)  Creatinine 0.61 - 1.24 mg/dL 1.13 1.58(H) 1.72(H)  Sodium 135 - 145 mmol/L 136 136  133(L)  Potassium 3.5 - 5.1 mmol/L 3.9 3.7 4.0  Chloride 98 - 111 mmol/L 111 112(H) 110  CO2 22 - 32 mmol/L 19(L) 18(L) 18(L)  Calcium 8.9 - 10.3 mg/dL 7.5(L) 7.5(L) 7.4(L)  Total Protein 6.5 - 8.1 g/dL - - 5.3(L)  Total Bilirubin 0.3 - 1.2 mg/dL - - 1.8(H)  Alkaline Phos 38 - 126 U/L - - 34(L)  AST 15 - 41 U/L - - 19  ALT 0 - 44 U/L - - 24   CBC Latest Ref Rng & Units 04/09/2021 04/08/2021 04/08/2021  WBC 4.0 - 10.5 K/uL 4.1 9.0 11.7(H)  Hemoglobin 13.0 - 17.0 g/dL 12.1(L) 12.8(L) 14.5  Hematocrit 39.0 - 52.0 % 35.2(L) 38.2(L) 43.1  Platelets 150 - 400 K/uL 61(L) 87(L) 121(L)    RADS: N/a Assessment:   S/p hartman's. Will observe for another day prior to starting feeds.  CPM.

## 2021-04-09 NOTE — Consult Note (Signed)
Pharmacy Antibiotic Note  Patrick Elliott is a 60 y.o. male with medical history including diabetes, alcohol abuse, HTN, HLD, bipolar disorder, ADHD, anxiety, peripheral neuropathy, diastolic HF admitted on 0000000 with sepsis.  Pharmacy has been consulted for cefepime dosing. Patient is also ordered metronidazole.  Infection source is peritonitis secondary to colon perforation and ischemia. Patient was taken to OR 8/18 for diagnostic laparotomy, open abdominal washout, sigmoidectomy and colostomy (Hartmann's procedure). OR cultures are pending.  Plan:  Adjust cefepime to 2 g IV q8h given improved renal function  Weight: 87.6 kg (193 lb 2 oz)  Temp (24hrs), Avg:99.1 F (37.3 C), Min:98.6 F (37 C), Max:100.3 F (37.9 C)  Recent Labs  Lab 04/08/21 0219 04/08/21 0343 04/08/21 0538 04/08/21 1438 04/08/21 1706 04/09/21 0410  WBC 11.7*  --   --  9.0  --  4.1  CREATININE 3.28*  --   --  1.72* 1.58* 1.13  LATICACIDVEN  --  3.7* 3.1* 1.6  --   --      Estimated Creatinine Clearance: 72.5 mL/min (by C-G formula based on SCr of 1.13 mg/dL).    Allergies  Allergen Reactions   Penicillins Nausea Only    Occurred with amoxicillin  Tolerated cephalosporins before   Sulfamethoxazole Nausea Only and Rash    Antimicrobials this admission: Vancomycin 8/18 x 1 Cefepime 8/18 >>  Metronidazole 8/18 >>   Dose adjustments this admission: N/A  Microbiology results: 8/18 Peritoneal Cx: pending 8/18 MRSA PCR: (-) 8/18 BCx: NGTD 8/18 UCx: pending   Thank you for allowing pharmacy to be a part of this patient's care.  Benita Gutter 04/09/2021 5:34 AM

## 2021-04-10 DIAGNOSIS — A419 Sepsis, unspecified organism: Secondary | ICD-10-CM | POA: Diagnosis not present

## 2021-04-10 DIAGNOSIS — E872 Acidosis: Secondary | ICD-10-CM | POA: Diagnosis not present

## 2021-04-10 DIAGNOSIS — K7460 Unspecified cirrhosis of liver: Secondary | ICD-10-CM

## 2021-04-10 DIAGNOSIS — R6520 Severe sepsis without septic shock: Secondary | ICD-10-CM | POA: Diagnosis not present

## 2021-04-10 LAB — CBC
HCT: 35.5 % — ABNORMAL LOW (ref 39.0–52.0)
Hemoglobin: 11.8 g/dL — ABNORMAL LOW (ref 13.0–17.0)
MCH: 28 pg (ref 26.0–34.0)
MCHC: 33.2 g/dL (ref 30.0–36.0)
MCV: 84.1 fL (ref 80.0–100.0)
Platelets: 71 10*3/uL — ABNORMAL LOW (ref 150–400)
RBC: 4.22 MIL/uL (ref 4.22–5.81)
RDW: 15.1 % (ref 11.5–15.5)
WBC: 4 10*3/uL (ref 4.0–10.5)
nRBC: 0 % (ref 0.0–0.2)

## 2021-04-10 LAB — HIV ANTIBODY (ROUTINE TESTING W REFLEX): HIV Screen 4th Generation wRfx: REACTIVE — AB

## 2021-04-10 LAB — HEPATIC FUNCTION PANEL
ALT: 24 U/L (ref 0–44)
AST: 25 U/L (ref 15–41)
Albumin: 2.8 g/dL — ABNORMAL LOW (ref 3.5–5.0)
Alkaline Phosphatase: 40 U/L (ref 38–126)
Bilirubin, Direct: 0.4 mg/dL — ABNORMAL HIGH (ref 0.0–0.2)
Indirect Bilirubin: 1.6 mg/dL — ABNORMAL HIGH (ref 0.3–0.9)
Total Bilirubin: 2 mg/dL — ABNORMAL HIGH (ref 0.3–1.2)
Total Protein: 5.5 g/dL — ABNORMAL LOW (ref 6.5–8.1)

## 2021-04-10 LAB — PHOSPHORUS: Phosphorus: 1.8 mg/dL — ABNORMAL LOW (ref 2.5–4.6)

## 2021-04-10 LAB — GLUCOSE, CAPILLARY
Glucose-Capillary: 112 mg/dL — ABNORMAL HIGH (ref 70–99)
Glucose-Capillary: 107 mg/dL — ABNORMAL HIGH (ref 70–99)
Glucose-Capillary: 123 mg/dL — ABNORMAL HIGH (ref 70–99)
Glucose-Capillary: 93 mg/dL (ref 70–99)
Glucose-Capillary: 111 mg/dL — ABNORMAL HIGH (ref 70–99)

## 2021-04-10 LAB — BASIC METABOLIC PANEL
Anion gap: 9 (ref 5–15)
BUN: 19 mg/dL (ref 8–23)
CO2: 21 mmol/L — ABNORMAL LOW (ref 22–32)
Calcium: 7.7 mg/dL — ABNORMAL LOW (ref 8.9–10.3)
Chloride: 106 mmol/L (ref 98–111)
Creatinine, Ser: 0.96 mg/dL (ref 0.61–1.24)
GFR, Estimated: >60 mL/min (ref >60)
Glucose, Bld: 117 mg/dL — ABNORMAL HIGH (ref 70–99)
Potassium: 3.7 mmol/L (ref 3.5–5.1)
Sodium: 136 mmol/L (ref 135–145)

## 2021-04-10 LAB — PROCALCITONIN: Procalcitonin: 8.31 ng/mL

## 2021-04-10 LAB — PROTIME-INR
INR: 1.4 — ABNORMAL HIGH (ref 0.8–1.2)
Prothrombin Time: 16.7 seconds — ABNORMAL HIGH (ref 11.4–15.2)

## 2021-04-10 MED ORDER — NAPHAZOLINE-GLYCERIN 0.012-0.25 % OP SOLN
1.0000 [drp] | Freq: Four times a day (QID) | OPHTHALMIC | Status: DC | PRN
  Filled 2021-04-10: qty 15

## 2021-04-10 MED ORDER — ENOXAPARIN SODIUM 40 MG/0.4ML IJ SOSY
40.0000 mg | PREFILLED_SYRINGE | INTRAMUSCULAR | Status: DC
  Administered 2021-04-10 – 2021-04-13 (×4): 40 mg via SUBCUTANEOUS
  Filled 2021-04-10 (×4): qty 0.4

## 2021-04-10 MED ORDER — POTASSIUM PHOSPHATES 15 MMOLE/5ML IV SOLN
30.0000 mmol | Freq: Once | INTRAVENOUS | Status: AC
  Administered 2021-04-10: 30 mmol via INTRAVENOUS
  Filled 2021-04-10: qty 10

## 2021-04-10 NOTE — Consult Note (Signed)
PHARMACY CONSULT NOTE  Pharmacy Consult for Electrolyte Monitoring and Replacement   Recent Labs: Potassium  Date Value  04/10/2021 3.7 mmol/L  04/25/2012 4.2 mEq/L   Magnesium (mg/dL)  Date Value  04/09/2021 2.1   Calcium (mg/dL)  Date Value  04/10/2021 7.7 (L)  04/25/2012 9.3   Albumin (g/dL)  Date Value  04/10/2021 2.8 (L)  04/16/2019 4.3  04/25/2012 4.1   Phosphorus (mg/dL)  Date Value  04/10/2021 1.8 (L)   Sodium  Date Value  04/10/2021 136 mmol/L  03/06/2020 136 mmol/L  04/25/2012 134 mEq/L (L)   Assessment: Patient is a 60 y/o M with medical history including diabetes, alcohol abuse, HTN, HLD, bipolar disorder, ADHD, anxiety, peripheral neuropathy, diastolic HF who is admitted with sepsis. Patient taken to OR 8/18 for colon perforation and ischemia, s/p Hartmann's procedure. Pharmacy consulted to assist with electrolyte monitoring and replacement as indicated.    Goal of Therapy:  Electrolytes within normal limits  Plan:  K 3.7  Phos 1.8  Scr 0.96  Na 136 --will order Potassium phosphate 30 mmol IV x 1 --Continue to follow along. Recheck with AM labs  Chinita Greenland PharmD Clinical Pharmacist 04/10/2021

## 2021-04-10 NOTE — Consult Note (Signed)
Pharmacy Antibiotic Note  Patrick Elliott is a 60 y.o. male with medical history including diabetes, alcohol abuse, HTN, HLD, bipolar disorder, ADHD, anxiety, peripheral neuropathy, diastolic HF admitted on 0000000 with sepsis. Patient taken to the OR 8/18 for colon perforation and ischemia, s/p Hartmann's procedure. Pharmacy has been consulted for Zosyn dosing.  ID following  -Vancomycin discontinued by ID 8/19 -also on Eraxis '100mg'$  IV q24h  Plan: Continue Zosyn 3.375 g IV q8h extended infusion   Weight: 88.5 kg (195 lb 1.7 oz)  Temp (24hrs), Avg:98.4 F (36.9 C), Min:98.1 F (36.7 C), Max:98.7 F (37.1 C)  Recent Labs  Lab 04/08/21 0219 04/08/21 0343 04/08/21 0538 04/08/21 1438 04/08/21 1706 04/09/21 0410 04/10/21 0617  WBC 11.7*  --   --  9.0  --  4.1 4.0  CREATININE 3.28*  --   --  1.72* 1.58* 1.13 0.96  LATICACIDVEN  --  3.7* 3.1* 1.6  --   --   --      Estimated Creatinine Clearance: 85.8 mL/min (by C-G formula based on SCr of 0.96 mg/dL).    Allergies  Allergen Reactions   Penicillins Nausea Only    Occurred with amoxicillin  Tolerated cephalosporins before   Sulfamethoxazole Nausea Only and Rash    Antimicrobials this admission: Cefepime 8/18 >> 8/19 Metronidazole 8/18 >> 8/19 Vancomycin 8/18 >> 8/19 Zosyn 8/19 >> Eraxis 8/19 >>   Microbiology results: 8/18 Peritoneal Cx: NG, pending 8/18 BCx: NGTD 8/18 UCx: insignificant growth  8/18 MRSA PCR: (-)  Thank you for allowing pharmacy to be a part of this patient's care.  Noralee Space, PharmD Clinical Pharmacist 04/10/2021 10:25 AM

## 2021-04-10 NOTE — TOC Initial Note (Signed)
Transition of Care [facility]) - Initial/Assessment Note    Patient Details  Name: Patrick Elliott MRN: [id number] Date of Birth: 1960/10/08  Transition of Care [facility]) CM/SW Contact:    Magnus Ivan, LCSW Phone Number: 04/10/2021, 12:04 PM  Clinical Narrative:             CSW completed high risk assessment with patient. Patient lives alone and usually drives himself to appointments. PCP is Dr. Posey Pronto. [facility]. Patient uses a cane, also has a shower chair at home. Patient denied SNF or [place] history. TOC will follow for possible needs.       Expected Discharge Plan: Home/Self Care Barriers to Discharge: Continued Medical Work up   Patient Goals and CMS Choice Patient states their goals for this hospitalization and ongoing recovery are:: to return home CMS Medicare.gov Compare Post Acute Care list provided to:: Patient Choice offered to / list presented to : Patient  Expected Discharge Plan and Services Expected Discharge Plan: Home/Self Care       Living arrangements for the past 2 months: Single Family Home                                      Prior Living Arrangements/Services Living arrangements for the past 2 months: Single Family Home Lives with:: Self Patient language and need for interpreter reviewed:: Yes Do you feel safe going back to the place where you live?: Yes          Current home services: DME Criminal Activity/Legal Involvement Pertinent to Current Situation/Hospitalization: No - Comment as needed  Activities of Daily Living      Permission Sought/Granted Permission sought to share information with : Facility Art therapist granted to share information with : Yes, Verbal Permission Granted     Permission granted to share info w AGENCY: HH/DME if needed        Emotional Assessment       Orientation: : Oriented to Self, Oriented to Place, Oriented to  Time, Oriented to Situation Alcohol / Substance Use:  Not Applicable Psych Involvement: No (comment)  Admission diagnosis:  Lactic acidosis [E87.2] Colitis [K52.9] Hyperglycemia [R73.9] Rectal bleed [K62.5] AKI (acute kidney injury) ([place]) [N17.9] Nausea vomiting and diarrhea [R11.2, R19.7] Hypotension due to hypovolemia [I95.89, E86.1] Severe sepsis with lactic acidosis ([place]) [A41.9, E87.2, R65.20] Patient Active Problem List   Diagnosis Date Noted   Cirrhosis ([place]) 04/10/2021   Severe sepsis with lactic acidosis ([place]) 04/08/2021   Seborrheic keratosis 03/07/2020   Post-traumatic osteoarthritis of right knee 10/10/2018   Primary insomnia 02/08/2018   Healthcare maintenance 08/19/2016   Urethral pain 12/05/2014   Thoracic back pain 05/30/2013   Chronic venous insufficiency 05/30/2013   Chronic diastolic congestive heart failure ([place]) 10/18/2012   Arthritis of midfoot 09/09/2010   UNEQUAL LEG LENGTH 08/05/2010   ABNORMALITY OF GAIT 08/05/2010   PERIPHERAL NEUROPATHY 06/16/2010   GERD 05/19/2010   Attention deficit hyperactivity disorder (ADHD) 04/14/2010   Bipolar disorder ([place]) 12/24/2008   ANXIETY DISORDER 08/26/2008   Hypercholesteremia 05/23/2008   HYPERTENSION, BENIGN ESSENTIAL 02/20/2007   Diabetes mellitus with neuropathy ([place]) 10/19/2006   ALCOHOL ABUSE, HX OF 10/19/2006   PCP:  Lattie Haw, MD [facility]   [id number] [place] [place] [id number]: [phone]: [phone]    Social Determinants of Health (  SDOH) Interventions    Readmission Risk Interventions Readmission Risk Prevention Plan 04/10/2021  Transportation Screening Complete  PCP or Specialist Appt within 3-5 Days Complete  HRI or [place] Complete  Social Work Consult for [place] Planning/Counseling Complete  Palliative Care Screening Not Applicable  Medication Review Press photographer) Complete  Some recent data might be hidden

## 2021-04-10 NOTE — Plan of Care (Addendum)
NG clamped for trial per OR vervbal orders.  Will unclamped in 4hours (1530).  If 150cc less output (after turning suction back on) - OR indicated we may pull the NG and will proceed with diet.  1550.  NG was re-connected to suction at 1530.  Output was only about 25cc.  NG will be discontinued per orders and Dr. Ree Kida of successful trial.

## 2021-04-10 NOTE — Progress Notes (Signed)
Subjective:  CC: Patrick Elliott is a 60 y.o. male  Hospital stay day 2, 2 Days Post-Op hartman's  HPI: No acute issues overnight.  Gas and stool in ostomy bag now, pain controlled. Minimal output from NGT.  ROS:  General: Denies weight loss, weight gain, fatigue, fevers, chills, and night sweats. Heart: Denies chest pain, palpitations, racing heart, irregular heartbeat, leg pain or swelling, and decreased activity tolerance. Respiratory: Denies breathing difficulty, shortness of breath, wheezing, cough, and sputum. GI: Denies change in appetite, heartburn, nausea, vomiting, constipation, diarrhea, and blood in stool. GU: Denies difficulty urinating, pain with urinating, urgency, frequency, blood in urine.   Objective:   Temp:  [98.1 F (36.7 C)-98.7 F (37.1 C)] 98.7 F (37.1 C) (08/20 1100) Pulse Rate:  [84-90] 86 (08/20 0600) Resp:  [16-20] 16 (08/20 0600) BP: (132-157)/(71-92) 134/71 (08/20 0600) SpO2:  [93 %-99 %] 94 % (08/20 0600) Weight:  [88.5 kg] 88.5 kg (08/20 0500)       Weight: 88.5 kg BMI (Calculated): 30.55   Intake/Output this shift:   Intake/Output Summary (Last 24 hours) at 04/10/2021 1253 Last data filed at 04/10/2021 1001 Gross per 24 hour  Intake 922.75 ml  Output 5240 ml  Net -4317.25 ml     Constitutional :  alert, cooperative, appears stated age, and no distress  Respiratory:  clear to auscultation bilaterally  Cardiovascular:  regular rate and rhythm  Gastrointestinal: Soft, no guarding, some TTP around incision.  Ostomy with gas, mucosa dusky .   Skin: Cool and moist. Staples C/D/I. JP with serosanguinous outpt. NG in place  Psychiatric: Normal affect, non-agitated, not confused       LABS:  CMP Latest Ref Rng & Units 04/10/2021 04/09/2021 04/08/2021  Glucose 70 - 99 mg/dL 117(H) 127(H) 175(H)  BUN 8 - 23 mg/dL 19 25(H) 38(H)  Creatinine 0.61 - 1.24 mg/dL 0.96 1.13 1.58(H)  Sodium 135 - 145 mmol/L 136 136 136  Potassium 3.5 - 5.1 mmol/L 3.7 3.9  3.7  Chloride 98 - 111 mmol/L 106 111 112(H)  CO2 22 - 32 mmol/L 21(L) 19(L) 18(L)  Calcium 8.9 - 10.3 mg/dL 7.7(L) 7.5(L) 7.5(L)  Total Protein 6.5 - 8.1 g/dL 5.5(L) - -  Total Bilirubin 0.3 - 1.2 mg/dL 2.0(H) - -  Alkaline Phos 38 - 126 U/L 40 - -  AST 15 - 41 U/L 25 - -  ALT 0 - 44 U/L 24 - -   CBC Latest Ref Rng & Units 04/10/2021 04/09/2021 04/08/2021  WBC 4.0 - 10.5 K/uL 4.0 4.1 9.0  Hemoglobin 13.0 - 17.0 g/dL 11.8(L) 12.1(L) 12.8(L)  Hematocrit 39.0 - 52.0 % 35.5(L) 35.2(L) 38.2(L)  Platelets 150 - 400 K/uL 71(L) 61(L) 87(L)    RADS: N/a Assessment:   S/p hartman's. Will clamp NGT and possibly remove if low residual.  If removed, can initiate diet.

## 2021-04-10 NOTE — Progress Notes (Signed)
PROGRESS NOTE    FINCH PFUHL  [id number] DOB: Sep 15, 1960 DOA: 04/08/2021 PCP: Lattie Haw, MD  Outpatient Specialists: endocrinology    Brief Narrative:   Admitted 8/18 with one day of vomiting, abdominal pain, and hematochezia. Found to have colon perforation with peritonitis, likely 2/2 ischemic bowel. Is now s/p open washout with sigmoid colectomy and colostomy on 8/18. Transferred to hospitalist service 8/20.    Assessment & Plan:   Active Problems:   Severe sepsis with lactic acidosis (HCC)   Cirrhosis ([place])  # Ischemic colitis with perforation # Peritonitis # Sepsis # GI bleeding S/p washout with Hartman's procedure 8/18. ID following. NG tube in place. Hemodynamically stable. Hgb stable, no further bleeding - continue anidulafungin and zosyn - f/u gen surg recs, may start oral feeds today  # T2DM Mild DKA on presentation, now resolved - home jardiance, glargine, aspart, metformin on hold - SSI q4 while npo, lantus 10  # HIV positive test Screening positive, likely false positive as no recent risk factors and neg hiv in 2018 - f/u confirmatory testing  # Bipolar disorder - re-start home lithium and lamotrigine when taking po  # ADHD - re-start home vyvanse and ritalin at some point  # HTN - re-start home simvastatin when taking po - home lisinopril on hold  # Insomnia - home zolpidem and nortriptyline on hold  # Neuropathy - home pregabalin on hold  # Cirrhosis # Thrombocytopenia Stable. Not followed by GI as outpt. Hx alcoholism, abstinent 15 years now. - outpt GI f/u   DVT prophylaxis: lovenox Code Status: full Family Communication: none @ bedside  Level of care: Stepdown Status is: Inpatient  Remains inpatient appropriate because:Inpatient level of care appropriate due to severity of illness  Dispo: The patient is from: Home              Anticipated d/c is to: tbd              Patient currently is not medically stable to  d/c.   Difficult to place patient No        Consultants:  ID, gen surg  Procedures: Hartmann's procedure 8/18  Antimicrobials:  See above    Subjective: This morning pain moderate. Thirsty, has bit of appetite. Output at ostomy. No chest pain or sob  Objective: Vitals:   04/10/21 0300 04/10/21 0400 04/10/21 0500 04/10/21 0600  BP: (!) 144/76 (!) 151/83 (!) 143/74 134/71  Pulse: 84 90 90 86  Resp: '17 17 17 16  '$ Temp:  98.5 F (36.9 C)    TempSrc:  Oral    SpO2: 94% 93% 95% 94%  Weight:   88.5 kg     Intake/Output Summary (Last 24 hours) at 04/10/2021 0751 Last data filed at 04/10/2021 0600 Gross per 24 hour  Intake 922.75 ml  Output 4570 ml  Net -3647.25 ml   Filed Weights   04/08/21 0453 04/09/21 0455 04/10/21 0500  Weight: 87.9 kg 87.6 kg 88.5 kg    Examination:  General exam: Appears calm and comfortable  Respiratory system: Clear to auscultation. Respiratory effort normal. Cardiovascular system: S1 & S2 heard, RRR. No JVD, murmurs, rubs, gallops or clicks. No pedal edema. Gastrointestinal system: Abdomen is distended. Ostomy with output. Midline surgical incision intact. Mild tenderness throughout Central nervous system: Alert and oriented. No focal neurological deficits. Extremities: Symmetric 5 x 5 power. Skin: No rashes, lesions or ulcers Psychiatry: Judgement and insight appear normal. Mood & affect appropriate.  Data Reviewed: I have personally reviewed following labs and imaging studies  CBC: Recent Labs  Lab 04/08/21 0219 04/08/21 1438 04/09/21 0410 04/10/21 0617  WBC 11.7* 9.0 4.1 4.0  NEUTROABS 9.7*  --   --   --   HGB 14.5 12.8* 12.1* 11.8*  HCT 43.1 38.2* 35.2* 35.5*  MCV 83.5 82.3 84.4 84.1  PLT 121* 87* 61* 71*   Basic Metabolic Panel: Recent Labs  Lab 04/08/21 0219 04/08/21 1438 04/08/21 1706 04/09/21 0410 04/10/21 0617  NA 123* 133* 136 136 136  K 5.0 4.0 3.7 3.9 3.7  CL 92* 110 112* 111 106  CO2 17* 18* 18* 19*  21*  GLUCOSE 507* 195* 175* 127* 117*  BUN 58* 42* 38* 25* 19  CREATININE 3.28* 1.72* 1.58* 1.13 0.96  CALCIUM 8.4* 7.4* 7.5* 7.5* 7.7*  MG  --   --  2.2 2.1  --   PHOS  --   --  3.1 1.7* 1.8*   GFR: Estimated Creatinine Clearance: 85.8 mL/min (by C-G formula based on SCr of 0.96 mg/dL). Liver Function Tests: Recent Labs  Lab 04/08/21 0219 04/08/21 1438 04/10/21 0617  AST 32 19 25  ALT 34 24 24  ALKPHOS 41 34* 40  BILITOT 2.4* 1.8* 2.0*  PROT 6.3* 5.3* 5.5*  ALBUMIN 3.5 2.8* 2.8*   Recent Labs  Lab 04/08/21 0219  LIPASE 27  AMYLASE 62   Recent Labs  Lab 04/08/21 0231  AMMONIA 24   Coagulation Profile: Recent Labs  Lab 04/08/21 0219 04/10/21 0617  INR 1.6* 1.4*   Cardiac Enzymes: No results for input(s): CKTOTAL, CKMB, CKMBINDEX, TROPONINI in the last 168 hours. BNP (last 3 results) No results for input(s): PROBNP in the last 8760 hours. HbA1C: No results for input(s): HGBA1C in the last 72 hours. CBG: Recent Labs  Lab 04/09/21 1150 04/09/21 1527 04/09/21 1952 04/09/21 2336 04/10/21 0413  GLUCAP 114* 124* 132* 122* 112*   Lipid Profile: No results for input(s): CHOL, HDL, LDLCALC, TRIG, CHOLHDL, LDLDIRECT in the last 72 hours. Thyroid Function Tests: No results for input(s): TSH, T4TOTAL, FREET4, T3FREE, THYROIDAB in the last 72 hours. Anemia Panel: No results for input(s): VITAMINB12, FOLATE, FERRITIN, TIBC, IRON, RETICCTPCT in the last 72 hours. Urine analysis:    Component Value Date/Time   COLORURINE YELLOW (A) 04/08/2021 0222   APPEARANCEUR CLEAR (A) 04/08/2021 0222   LABSPEC 1.010 04/08/2021 0222   PHURINE 6.0 04/08/2021 0222   GLUCOSEU >=500 (A) 04/08/2021 0222   HGBUR NEGATIVE 04/08/2021 0222   BILIRUBINUR NEGATIVE 04/08/2021 0222   BILIRUBINUR NEG 12/31/2014 1649   KETONESUR NEGATIVE 04/08/2021 0222   PROTEINUR NEGATIVE 04/08/2021 0222   UROBILINOGEN 2.0 12/31/2014 1649   NITRITE NEGATIVE 04/08/2021 0222   LEUKOCYTESUR NEGATIVE  04/08/2021 0222   Sepsis Labs: '@LABRCNTIP'$ (procalcitonin:4,lacticidven:4)  ) Recent Results (from the past 240 hour(s))  Resp Panel by RT-PCR (Flu A&B, Covid) Nasopharyngeal Swab     Status: None   Collection Time: 04/08/21  2:22 AM   Specimen: Nasopharyngeal Swab; Nasopharyngeal(NP) swabs in vial transport medium  Result Value Ref Range Status   SARS Coronavirus 2 by RT PCR NEGATIVE NEGATIVE Final    Comment: (NOTE) SARS-CoV-2 target nucleic acids are NOT DETECTED.  The SARS-CoV-2 RNA is generally detectable in upper respiratory specimens during the acute phase of infection. The lowest concentration of SARS-CoV-2 viral copies this assay can detect is 138 copies/mL. A negative result does not preclude SARS-Cov-2 infection and should not be used as the sole  basis for treatment or other patient management decisions. A negative result may occur with  improper specimen collection/handling, submission of specimen other than nasopharyngeal swab, presence of viral mutation(s) within the areas targeted by this assay, and inadequate number of viral copies(<138 copies/mL). A negative result must be combined with clinical observations, patient history, and epidemiological information. The expected result is Negative.  Fact Sheet for Patients:  EntrepreneurPulse.com.au  Fact Sheet for Healthcare Providers:  IncredibleEmployment.be  This test is no t yet approved or cleared by the [place] FDA and  has been authorized for detection and/or diagnosis of SARS-CoV-2 by FDA under an Emergency Use Authorization (EUA). This EUA will remain  in effect (meaning this test can be used) for the duration of the COVID-19 declaration under Section 564(b)(1) of the Act, 21 U.S.C.section 360bbb-3(b)(1), unless the authorization is terminated  or revoked sooner.       Influenza A by PCR NEGATIVE NEGATIVE Final   Influenza B by PCR NEGATIVE NEGATIVE Final     Comment: (NOTE) The Xpert Xpress SARS-CoV-2/FLU/RSV plus assay is intended as an aid in the diagnosis of influenza from Nasopharyngeal swab specimens and should not be used as a sole basis for treatment. Nasal washings and aspirates are unacceptable for Xpert Xpress SARS-CoV-2/FLU/RSV testing.  Fact Sheet for Patients: EntrepreneurPulse.com.au  Fact Sheet for Healthcare Providers: IncredibleEmployment.be  This test is not yet approved or cleared by the [place] FDA and has been authorized for detection and/or diagnosis of SARS-CoV-2 by FDA under an Emergency Use Authorization (EUA). This EUA will remain in effect (meaning this test can be used) for the duration of the COVID-19 declaration under Section 564(b)(1) of the Act, 21 U.S.C. section 360bbb-3(b)(1), unless the authorization is terminated or revoked.  Performed at [facility], [address] [place], [place] [address]   Culture, blood (Routine X 2) w Reflex to ID Panel     Status: None (Preliminary result)   Collection Time: 04/08/21  3:43 AM   Specimen: BLOOD  Result Value Ref Range Status   Specimen Description BLOOD LEFT [facility]  Final   Special Requests   Final    BOTTLES DRAWN AEROBIC AND ANAEROBIC Blood Culture adequate volume   Culture   Final    NO GROWTH 2 DAYS Performed at [facility], [address] [place], [place] [address]    Report Status PENDING  Incomplete  Culture, blood (Routine X 2) w Reflex to ID Panel     Status: None (Preliminary result)   Collection Time: 04/08/21  3:43 AM   Specimen: BLOOD  Result Value Ref Range Status   Specimen Description BLOOD RIGHT FA  Final   Special Requests   Final    BOTTLES DRAWN AEROBIC AND ANAEROBIC Blood Culture adequate volume   Culture   Final    NO GROWTH 2 DAYS Performed at [facility], [address], [place], [place] [address]    Report Status PENDING  Incomplete  Urine Culture      Status: Abnormal   Collection Time: 04/08/21  3:43 AM   Specimen: Urine, Clean Catch  Result Value Ref Range Status   Specimen Description   Final    URINE, CLEAN CATCH Performed at [facility], [address], [place], [place] [address]    Special Requests   Final    NONE Performed at [facility], [address] [place], [place] [address]    Culture (A)  Final    <10,000  COLONIES/mL INSIGNIFICANT GROWTH Performed at [facility] [address] [place], [place] [address]    Report Status 04/09/2021 FINAL  Final  MRSA Next Gen by PCR, Nasal     Status: None   Collection Time: 04/08/21  6:34 AM   Specimen: Nasal Mucosa; Nasal Swab  Result Value Ref Range Status   MRSA by PCR Next Gen NOT DETECTED NOT DETECTED Final    Comment: (NOTE) The GeneXpert MRSA Assay (FDA approved for NASAL specimens only), is one component of a comprehensive MRSA colonization surveillance program. It is not intended to diagnose MRSA infection nor to guide or monitor treatment for MRSA infections. Test performance is not FDA approved in patients less than 22 years old. Performed at [facility], [address] [place], [place] [address]   Aerobic/Anaerobic Culture w Gram Stain (surgical/deep wound)     Status: None (Preliminary result)   Collection Time: 04/08/21 10:02 AM   Specimen: Other Source; Body Fluid  Result Value Ref Range Status   Specimen Description   Final    PERITONEAL Performed at [facility], [address] [place], [place] [address]    Special Requests SWAB  Final   Gram Stain   Final    FEW WBC PRESENT,BOTH PMN AND MONONUCLEAR NO ORGANISMS SEEN    Culture   Final    NO GROWTH < 24 HOURS Performed at [facility], [place] [address], [place], [place] [address]    Report Status PENDING  Incomplete         Radiology Studies: No results found.      Scheduled Meds:  sodium chloride   Intravenous Once    Chlorhexidine Gluconate Cloth  6 each Topical Q0600   insulin aspart  3-9 Units Subcutaneous Q4H   insulin detemir  10 Units Subcutaneous Daily   pantoprazole (PROTONIX) IV  40 mg Intravenous Q24H   Continuous Infusions:  sodium chloride Stopped (04/08/21 0703)   anidulafungin     piperacillin-tazobactam (ZOSYN)  IV 3.375 g (04/10/21 0557)     LOS: 2 days    Time spent: 63 min    Desma Maxim, MD [facility]   If 7PM-7AM, please contact night-coverage [email or web address] Password [email or web address] 04/10/2021, 7:51 AM

## 2021-04-10 NOTE — Plan of Care (Signed)

## 2021-04-11 DIAGNOSIS — R6520 Severe sepsis without septic shock: Secondary | ICD-10-CM | POA: Diagnosis not present

## 2021-04-11 DIAGNOSIS — E872 Acidosis: Secondary | ICD-10-CM | POA: Diagnosis not present

## 2021-04-11 DIAGNOSIS — A419 Sepsis, unspecified organism: Secondary | ICD-10-CM | POA: Diagnosis not present

## 2021-04-11 LAB — HEPATIC FUNCTION PANEL
ALT: 25 U/L (ref 0–44)
AST: 24 U/L (ref 15–41)
Albumin: 2.9 g/dL — ABNORMAL LOW (ref 3.5–5.0)
Alkaline Phosphatase: 47 U/L (ref 38–126)
Bilirubin, Direct: 0.3 mg/dL — ABNORMAL HIGH (ref 0.0–0.2)
Indirect Bilirubin: 1.1 mg/dL — ABNORMAL HIGH (ref 0.3–0.9)
Total Bilirubin: 1.4 mg/dL — ABNORMAL HIGH (ref 0.3–1.2)
Total Protein: 6.1 g/dL — ABNORMAL LOW (ref 6.5–8.1)

## 2021-04-11 LAB — BASIC METABOLIC PANEL
Anion gap: 8 (ref 5–15)
BUN: 16 mg/dL (ref 8–23)
CO2: 21 mmol/L — ABNORMAL LOW (ref 22–32)
Calcium: 8.1 mg/dL — ABNORMAL LOW (ref 8.9–10.3)
Chloride: 107 mmol/L (ref 98–111)
Creatinine, Ser: 0.77 mg/dL (ref 0.61–1.24)
GFR, Estimated: >60 mL/min (ref >60)
Glucose, Bld: 102 mg/dL — ABNORMAL HIGH (ref 70–99)
Potassium: 3.6 mmol/L (ref 3.5–5.1)
Sodium: 136 mmol/L (ref 135–145)

## 2021-04-11 LAB — GLUCOSE, CAPILLARY
Glucose-Capillary: 100 mg/dL — ABNORMAL HIGH (ref 70–99)
Glucose-Capillary: 101 mg/dL — ABNORMAL HIGH (ref 70–99)
Glucose-Capillary: 146 mg/dL — ABNORMAL HIGH (ref 70–99)
Glucose-Capillary: 197 mg/dL — ABNORMAL HIGH (ref 70–99)
Glucose-Capillary: 207 mg/dL — ABNORMAL HIGH (ref 70–99)
Glucose-Capillary: 99 mg/dL (ref 70–99)

## 2021-04-11 LAB — PHOSPHORUS: Phosphorus: 2.2 mg/dL — ABNORMAL LOW (ref 2.5–4.6)

## 2021-04-11 LAB — CBC
HCT: 39.6 % (ref 39.0–52.0)
Hemoglobin: 13.2 g/dL (ref 13.0–17.0)
MCH: 28 pg (ref 26.0–34.0)
MCHC: 33.3 g/dL (ref 30.0–36.0)
MCV: 84.1 fL (ref 80.0–100.0)
Platelets: 87 10*3/uL — ABNORMAL LOW (ref 150–400)
RBC: 4.71 MIL/uL (ref 4.22–5.81)
RDW: 14.6 % (ref 11.5–15.5)
WBC: 3.7 10*3/uL — ABNORMAL LOW (ref 4.0–10.5)
nRBC: 0 % (ref 0.0–0.2)

## 2021-04-11 LAB — MAGNESIUM: Magnesium: 1.7 mg/dL (ref 1.7–2.4)

## 2021-04-11 LAB — PROTIME-INR
INR: 1.2 (ref 0.8–1.2)
Prothrombin Time: 15.4 seconds — ABNORMAL HIGH (ref 11.4–15.2)

## 2021-04-11 MED ORDER — POTASSIUM PHOSPHATES 15 MMOLE/5ML IV SOLN
20.0000 mmol | Freq: Once | INTRAVENOUS | Status: AC
  Administered 2021-04-11: 20 mmol via INTRAVENOUS
  Filled 2021-04-11 (×2): qty 6.67

## 2021-04-11 MED ORDER — MAGNESIUM SULFATE 2 GM/50ML IV SOLN
2.0000 g | Freq: Once | INTRAVENOUS | Status: AC
  Administered 2021-04-11: 2 g via INTRAVENOUS
  Filled 2021-04-11: qty 50

## 2021-04-11 MED ORDER — CARVEDILOL 6.25 MG PO TABS
6.2500 mg | ORAL_TABLET | Freq: Two times a day (BID) | ORAL | Status: DC
  Administered 2021-04-11 – 2021-04-13 (×4): 6.25 mg via ORAL
  Filled 2021-04-11 (×4): qty 1

## 2021-04-11 MED ORDER — LISINOPRIL 10 MG PO TABS
10.0000 mg | ORAL_TABLET | Freq: Every day | ORAL | Status: DC
  Administered 2021-04-11 – 2021-04-13 (×3): 10 mg via ORAL
  Filled 2021-04-11 (×3): qty 1

## 2021-04-11 MED ORDER — LAMOTRIGINE 100 MG PO TABS
100.0000 mg | ORAL_TABLET | Freq: Every day | ORAL | Status: DC
  Administered 2021-04-11 – 2021-04-13 (×3): 100 mg via ORAL
  Filled 2021-04-11 (×3): qty 1

## 2021-04-11 MED ORDER — SIMVASTATIN 20 MG PO TABS
20.0000 mg | ORAL_TABLET | Freq: Every day | ORAL | Status: DC
  Administered 2021-04-11 – 2021-04-13 (×3): 20 mg via ORAL
  Filled 2021-04-11 (×3): qty 1

## 2021-04-11 MED ORDER — LITHIUM CARBONATE ER 450 MG PO TBCR
450.0000 mg | EXTENDED_RELEASE_TABLET | Freq: Every day | ORAL | Status: DC
  Administered 2021-04-11 – 2021-04-12 (×2): 450 mg via ORAL
  Filled 2021-04-11 (×3): qty 1

## 2021-04-11 MED ORDER — INSULIN ASPART 100 UNIT/ML IJ SOLN
0.0000 [IU] | Freq: Three times a day (TID) | INTRAMUSCULAR | Status: DC
  Administered 2021-04-11: 5 [IU] via SUBCUTANEOUS
  Administered 2021-04-12: 2 [IU] via SUBCUTANEOUS
  Administered 2021-04-12: 5 [IU] via SUBCUTANEOUS
  Administered 2021-04-12: 2 [IU] via SUBCUTANEOUS
  Administered 2021-04-13: 3 [IU] via SUBCUTANEOUS
  Filled 2021-04-11 (×5): qty 1

## 2021-04-11 MED ORDER — INSULIN GLARGINE-YFGN 100 UNIT/ML ~~LOC~~ SOLN
10.0000 [IU] | Freq: Every day | SUBCUTANEOUS | Status: DC
  Administered 2021-04-12: 10 [IU] via SUBCUTANEOUS
  Filled 2021-04-11 (×2): qty 0.1

## 2021-04-11 NOTE — Plan of Care (Signed)

## 2021-04-11 NOTE — Evaluation (Signed)
Physical Therapy Evaluation Patient Details Name: Patrick Elliott MRN: [id number] DOB: 1960-09-04 Today's Date: 04/11/2021   History of Present Illness  Pt is a 60 y/o M admitted on 04/08/21 with c/c of vomiting, abdominal pain & hematochezia. pt found to have colon perforation with peritonitis likely 2/2 ischemic bowel. 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  Clinical Impression  Pt seen for PT evaluation with co-tx with OT. Pt's significant other [name]) present for session. Prior to admission pt was independent with all mobility with use of SPC 2/2 R knee pain (pt needs TKA). On this date, pt c/o increased abdomina/back surgical pain. PT educates pt on log rolling technique for increased comfort with mobility. Pt is able to perform transfers with cuing for safe hand placement & requires RW to ambulate household distances with min assist + chair follow for safety (pt did require seated rest break 2/2 fatigue & R knee pain). Pt is able to stand & void in room with supervision. At this time, pt is unsafe to d/c home alone and would benefit, and is eager, to d/c to STR to maximize independence with mobility & reduce fall risk prior to return home alone.     Follow Up Recommendations SNF;Supervision/Assistance - 24 hour    Equipment Recommendations  Rolling walker with 5" wheels    Recommendations for Other Services       Precautions / Restrictions Precautions Precautions: Fall Precaution Comments: log roll for comfort 2/2 multiple abdominal incisions/drains Restrictions Weight Bearing Restrictions: No      Mobility  Bed Mobility Overal bed mobility: Needs Assistance Bed Mobility: Rolling;Sidelying to Sit Rolling: Min guard Sidelying to sit: Min guard       General bed mobility comments: cuing for log rolling technique    Transfers Overall transfer level: Needs assistance Equipment used: Rolling walker (2 wheeled) Transfers: Sit to/from  Stand Sit to Stand: Supervision         General transfer comment: cuing for safe hand placement to push to standing  Ambulation/Gait Ambulation/Gait assistance: Min guard Gait Distance (Feet): 60 Feet Assistive device: Rolling walker (2 wheeled) Gait Pattern/deviations: Decreased step length - right;Decreased step length - left;Decreased stride length;Narrow base of support Gait velocity: decreased   General Gait Details: cuing to ambulate within base of AD when turning, pt beginning to experience R knee buckling towards end of gait (significant other reports this is farther than pt typically walks at home) so rest break taken  Stairs            Wheelchair Mobility    Modified Rankin (Stroke Patients Only)       Balance Overall balance assessment: Needs assistance Sitting-balance support: Bilateral upper extremity supported;Feet supported Sitting balance-Leahy Scale: Good     Standing balance support: No upper extremity supported;During functional activity Standing balance-Leahy Scale: Fair                               Pertinent Vitals/Pain Pain Assessment: 0-10 Pain Score: 7  Pain Location: L side of abdomen & back Pain Descriptors / Indicators: Discomfort;Grimacing Pain Intervention(s): Limited activity within patient's tolerance;Monitored during session    Home Living Family/patient expects to be discharged to:: Private residence Living Arrangements: Alone Available Help at Discharge: Family;Friend(s);Available PRN/intermittently Type of Home: House (condo) Home Access: Stairs to enter Entrance Stairs-Rails: Right;Left (wideset) Entrance Stairs-Number of Steps: 5 Home Layout: One level Home Equipment:  Cane - single point Additional Comments: R knee brace (was scheduled for TKA in 2020 but surgery was cancelled 2/2 covid & procedure never rescheduled), limited ambulator 2/2 R knee pain    Prior Function Level of Independence: Independent  with assistive device(s)   Gait / Transfers Assistance Needed: Ambulatory with [facility] & R knee brace           Hand Dominance        Extremity/Trunk Assessment   Upper Extremity Assessment Upper Extremity Assessment: Overall WFL for tasks assessed    Lower Extremity Assessment Lower Extremity Assessment: Generalized weakness       Communication   Communication: No difficulties  Cognition Arousal/Alertness: Awake/alert Behavior During Therapy: WFL for tasks assessed/performed Overall Cognitive Status: Within Functional Limits for tasks assessed                                 General Comments: Pleasant gentleman, appreciative of efforts      General Comments      Exercises     Assessment/Plan    PT Assessment Patient needs continued PT services  PT Problem List Decreased strength;Decreased mobility;Decreased activity tolerance;Decreased balance;Decreased knowledge of use of DME;Pain       PT Treatment Interventions DME instruction;Therapeutic activities;Modalities;Gait training;Therapeutic exercise;Patient/family education;Stair training;Functional mobility training;Manual techniques;Balance training;Neuromuscular re-education    PT Goals (Current goals can be found in the Care Plan section)  Acute Rehab PT Goals Patient Stated Goal: go to rehab PT Goal Formulation: With patient Time For Goal Achievement: 04/25/21 Potential to Achieve Goals: Good    Frequency Min 2X/week   Barriers to discharge Decreased caregiver support;Inaccessible home environment lives alone with 5 steps to enter    Co-evaluation PT/OT/SLP Co-Evaluation/Treatment: Yes Reason for Co-Treatment: Complexity of the patient's impairments (multi-system involvement);For patient/therapist safety;To address functional/ADL transfers PT goals addressed during session: Proper use of DME;Mobility/safety with mobility;Balance         AM-PAC PT "6 Clicks" Mobility  Outcome  Measure Help needed turning from your back to your side while in a flat bed without using bedrails?: A Little Help needed moving from lying on your back to sitting on the side of a flat bed without using bedrails?: A Little Help needed moving to and from a bed to a chair (including a wheelchair)?: A Little Help needed standing up from a chair using your arms (e.g., wheelchair or bedside chair)?: A Little Help needed to walk in hospital room?: A Little Help needed climbing 3-5 steps with a railing? : A Lot 6 Click Score: 17    End of Session   Activity Tolerance: Patient tolerated treatment well Patient left: in chair;with chair alarm set;with call bell/phone within reach;with family/visitor present Nurse Communication: Mobility status PT Visit Diagnosis: Unsteadiness on feet (R26.81);Muscle weakness (generalized) (M62.81);Pain Pain - Right/Left: Left Pain - part of body:  (abdomen & back)    Time: [id number] PT Time Calculation (min) (ACUTE ONLY): 23 min   Charges:   PT Evaluation $PT Eval Moderate Complexity: [place], PT, DPT 04/11/21, 3:04 PM   Waunita Schooner 04/11/2021, 3:02 PM

## 2021-04-11 NOTE — Consult Note (Signed)
PHARMACY CONSULT NOTE  Pharmacy Consult for Electrolyte Monitoring and Replacement   Recent Labs: Potassium  Date Value  04/11/2021 3.6 mmol/L  04/25/2012 4.2 mEq/L   Magnesium (mg/dL)  Date Value  04/11/2021 1.7   Calcium (mg/dL)  Date Value  04/11/2021 8.1 (L)  04/25/2012 9.3   Albumin (g/dL)  Date Value  04/11/2021 2.9 (L)  04/16/2019 4.3  04/25/2012 4.1   Phosphorus (mg/dL)  Date Value  04/11/2021 2.2 (L)   Sodium  Date Value  04/11/2021 136 mmol/L  03/06/2020 136 mmol/L  04/25/2012 134 mEq/L (L)   Assessment: Patient is a 60 y/o M with medical history including diabetes, alcohol abuse, HTN, HLD, bipolar disorder, ADHD, anxiety, peripheral neuropathy, diastolic HF who is admitted with sepsis. Patient taken to OR 8/18 for colon perforation and ischemia, s/p Hartmann's procedure. Pharmacy consulted to assist with electrolyte monitoring and replacement as indicated.    Goal of Therapy:  Electrolytes within normal limits  Plan:  K 3.6  Mag 1.7  Phos 2.2  Scr 0.77  Na 136 -will order Magnesium sulfate 2 gm IV x1 --will order Potassium phosphate 20 mmol IV x 1 --Continue to follow along. Recheck with AM labs  Chinita Greenland PharmD Clinical Pharmacist 04/11/2021

## 2021-04-11 NOTE — Evaluation (Signed)
Occupational Therapy Evaluation Patient Details Name: Patrick Elliott MRN: [id number] DOB: 05-30-1961 Today's Date: 04/11/2021    History of Present Illness Pt is a 60 y/o M admitted on 04/08/21 with c/o of vomiting, abdominal pain & hematochezia. pt found to have colon perforation with peritonitis likely 2/2 ischemic bowel. 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   Clinical Impression   Pt seen for OT evaluation this date in setting of acute hospitalization d/t colon perforation. Pt reports being MOD I at baseline with fxl mobility with L knee brace and SPC. Pt reports being INDEP with self care at baseline. Pt presents this date with decreased fxl activity tolerance and post-op pain limiting his performance and safety with self care at this time. Pt requires: SETUP for seated UB ADLs and MOD A for seated LB ADLs d/t decreased ability to tolerate hip rotation and no tolerance for bending at the waist. CGA for ADL transfers with RW for stability. Pt left seated in chair at end of session with all needs met and in reach. Anticipate he will require STR f/u OT services to improve balance and strength for fall prevention with ADLs.    Follow Up Recommendations  SNF    Equipment Recommendations  3 in 1 bedside commode;Other (comment) (2ww)    Recommendations for Other Services       Precautions / Restrictions Precautions Precautions: Fall Precaution Comments: log roll for comfort 2/2 multiple abdominal incisions/drains Restrictions Weight Bearing Restrictions: No      Mobility Bed Mobility Overal bed mobility: Needs Assistance Bed Mobility: Rolling;Sidelying to Sit Rolling: Min guard Sidelying to sit: Min guard       General bed mobility comments: cuing for log rolling technique    Transfers Overall transfer level: Needs assistance Equipment used: Rolling walker (2 wheeled) Transfers: Sit to/from Stand Sit to Stand: Supervision          General transfer comment: hand placement with front wheel walker, sequence for sit<>stand    Balance Overall balance assessment: Needs assistance Sitting-balance support: Bilateral upper extremity supported;Feet supported Sitting balance-Leahy Scale: Good     Standing balance support: No upper extremity supported;During functional activity Standing balance-Leahy Scale: Fair                             ADL either performed or assessed with clinical judgement   ADL Overall ADL's : Needs assistance/impaired                                       General ADL Comments: requires SETUP for seated UB ADLs and MOD A for seated LB ADLs d/t decreased ability to tolerate hip rotation and no tolerance for bending at the waist. CGA for ADL transfers with RW for stability.     Vision Patient Visual Report: No change from baseline       Perception     Praxis      Pertinent Vitals/Pain Pain Assessment: 0-10 Pain Score: 7  Pain Location: L side of abdomen & back Pain Descriptors / Indicators: Discomfort;Grimacing Pain Intervention(s): Limited activity within patient's tolerance;Monitored during session     Hand Dominance     Extremity/Trunk Assessment Upper Extremity Assessment Upper Extremity Assessment: Overall WFL for tasks assessed   Lower Extremity Assessment Lower Extremity Assessment: Generalized weakness  Communication Communication Communication: No difficulties   Cognition Arousal/Alertness: Awake/alert Behavior During Therapy: WFL for tasks assessed/performed Overall Cognitive Status: Within Functional Limits for tasks assessed                                 General Comments: Pleasant gentleman, appreciative of efforts   General Comments       Exercises Other Exercises Other Exercises: OT ed re: role of OT with pt and significant other   Shoulder Instructions      Home Living Family/patient expects to be  discharged to:: Private residence Living Arrangements: Alone Available Help at Discharge: Family;Friend(s);Available PRN/intermittently (significant other works) Type of Home: House ([place]) Home Access: Stairs to enter Technical brewer of Steps: 5 Entrance Stairs-Rails: Right;Left (wide-set) Home Layout: One level               Home Equipment: Cane - single point;Shower seat   Additional Comments: R knee brace (was scheduled for TKA in 2020 but surgery was cancelled 2/2 covid & procedure never rescheduled), limited ambulator 2/2 R knee pain      Prior Functioning/Environment Level of Independence: Independent with assistive device(s)  Gait / Transfers Assistance Needed: Ambulatory with [facility] & R knee brace   Communication / Swallowing Assistance Needed: Drove only to [facility]/get groceries. Able to do all basic self care.          OT Problem List: Decreased strength;Decreased activity tolerance;Impaired balance (sitting and/or standing);Decreased knowledge of use of DME or AE;Pain      OT Treatment/Interventions: Self-care/ADL training;DME and/or AE instruction;Therapeutic activities;Balance training;Therapeutic exercise;Patient/family education    OT Goals(Current goals can be found in the care plan section) Acute Rehab OT Goals Patient Stated Goal: go to rehab OT Goal Formulation: With patient Time For Goal Achievement: 04/25/21 Potential to Achieve Goals: Good  OT Frequency: Min 1X/week   Barriers to D/C: Decreased caregiver support  pt's significant other works and they do not live together.       Co-evaluation PT/OT/SLP Co-Evaluation/Treatment: Yes Reason for Co-Treatment: Complexity of the patient's impairments (multi-system involvement);To address functional/ADL transfers;For patient/therapist safety PT goals addressed during session: Mobility/safety with mobility;Proper use of DME OT goals addressed during session: ADL's and self-care      AM-PAC OT "6  Clicks" Daily Activity     Outcome Measure Help from another person eating meals?: None Help from another person taking care of personal grooming?: A Little Help from another person toileting, which includes using toliet, bedpan, or urinal?: A Little Help from another person bathing (including washing, rinsing, drying)?: A Little Help from another person to put on and taking off regular upper body clothing?: A Little Help from another person to put on and taking off regular lower body clothing?: A Little 6 Click Score: 19   End of Session Equipment Utilized During Treatment: Gait belt;Rolling walker Nurse Communication: Mobility status  Activity Tolerance: Patient tolerated treatment well Patient left: in chair;with call bell/phone within reach  OT Visit Diagnosis: Unsteadiness on feet (R26.81);Muscle weakness (generalized) (M62.81)                Time: 6160-7371 OT Time Calculation (min): 27 min Charges:  OT General Charges $OT Visit: 1 Visit OT Evaluation $OT Eval Moderate Complexity: [place], [place], OTR/L ascom [phone] 04/11/21, 5:04 PM

## 2021-04-11 NOTE — Progress Notes (Signed)
Subjective:  CC: Patrick Elliott is a 60 y.o. male  Hospital stay day 3, 3 Days Post-Op hartman's  HPI: No acute issues overnight.  NG tube removed and clear liquid diet initiated without any difficulty.  Patient continues to demonstrate bowel function with gas and stool in his colostomy bag.  Pain is well controlled.  ROS:  General: Denies weight loss, weight gain, fatigue, fevers, chills, and night sweats. Heart: Denies chest pain, palpitations, racing heart, irregular heartbeat, leg pain or swelling, and decreased activity tolerance. Respiratory: Denies breathing difficulty, shortness of breath, wheezing, cough, and sputum. GI: Denies change in appetite, heartburn, nausea, vomiting, constipation, diarrhea, and blood in stool. GU: Denies difficulty urinating, pain with urinating, urgency, frequency, blood in urine.   Objective:   Temp:  [97.6 F (36.4 C)-98.7 F (37.1 C)] 98.2 F (36.8 C) (08/21 1116) Pulse Rate:  [74-79] 74 (08/21 1116) Resp:  [13-20] 20 (08/21 1116) BP: (134-159)/(63-81) 155/79 (08/21 1116) SpO2:  [97 %-100 %] 100 % (08/21 1116) Weight:  [84 kg] 84 kg (08/21 0501)     Height: 5' 7.01" (170.2 cm) Weight: 84 kg BMI (Calculated): 29   Intake/Output this shift:   Intake/Output Summary (Last 24 hours) at 04/11/2021 1618 Last data filed at 04/11/2021 1348 Gross per 24 hour  Intake 80.78 ml  Output 1635 ml  Net -1554.22 ml     Constitutional :  alert, cooperative, appears stated age, and no distress  Respiratory:  clear to auscultation bilaterally  Cardiovascular:  regular rate and rhythm  Gastrointestinal: Soft, no guarding, some TTP around incision.  Ostomy with gas, mucosa dusky .   Skin: Cool and moist. Staples C/D/I. JP with serosanguinous outpt.   Psychiatric: Normal affect, non-agitated, not confused       LABS:  CMP Latest Ref Rng & Units 04/11/2021 04/10/2021 04/09/2021  Glucose 70 - 99 mg/dL 102(H) 117(H) 127(H)  BUN 8 - 23 mg/dL 16 19 25(H)   Creatinine 0.61 - 1.24 mg/dL 0.77 0.96 1.13  Sodium 135 - 145 mmol/L 136 136 136  Potassium 3.5 - 5.1 mmol/L 3.6 3.7 3.9  Chloride 98 - 111 mmol/L 107 106 111  CO2 22 - 32 mmol/L 21(L) 21(L) 19(L)  Calcium 8.9 - 10.3 mg/dL 8.1(L) 7.7(L) 7.5(L)  Total Protein 6.5 - 8.1 g/dL 6.1(L) 5.5(L) -  Total Bilirubin 0.3 - 1.2 mg/dL 1.4(H) 2.0(H) -  Alkaline Phos 38 - 126 U/L 47 40 -  AST 15 - 41 U/L 24 25 -  ALT 0 - 44 U/L 25 24 -   CBC Latest Ref Rng & Units 04/11/2021 04/10/2021 04/09/2021  WBC 4.0 - 10.5 K/uL 3.7(L) 4.0 4.1  Hemoglobin 13.0 - 17.0 g/dL 13.2 11.8(L) 12.1(L)  Hematocrit 39.0 - 52.0 % 39.6 35.5(L) 35.2(L)  Platelets 150 - 400 K/uL 87(L) 71(L) 61(L)    RADS: N/a Assessment:   S/p Hartman's procedure.  Tolerating clear liquids.  Will advance to full liquids today.  Anticipate discharge within the next 24 to 48 hours.

## 2021-04-11 NOTE — Progress Notes (Signed)
PROGRESS NOTE    Patrick Elliott  [id number] DOB: 09-21-1960 DOA: 04/08/2021 PCP: Lattie Haw, MD  Outpatient Specialists: endocrinology    Brief Narrative:   Admitted 8/18 with one day of vomiting, abdominal pain, and hematochezia. Found to have colon perforation with peritonitis, likely 2/2 ischemic bowel. Is now s/p open washout with sigmoid colectomy and colostomy on 8/18. Transferred to hospitalist service 8/20.    Assessment & Plan:   Active Problems:   Severe sepsis with lactic acidosis (HCC)   Cirrhosis ([place])  # Ischemic colitis with perforation # Peritonitis # Sepsis # GI bleeding S/p washout with Hartman's procedure 8/18. ID following. NG tube discontinued 8/20. Hgb stable, no further bleeding - continue anidulafungin and zosyn - continue to advance diet today, per gen surg - pt/ot consults, patient is interested in rehab if qualifies  # T2DM Mild DKA on presentation, now resolved - home jardiance, aspart, metformin on hold - SSI, lantus 10  # HIV positive test Screening positive, likely false positive as no recent risk factors and neg hiv in 2018 - f/u confirmatory testing  # Bipolar disorder - re-start home lithium and lamotrigine   # ADHD - re-start home vyvanse and ritalin at some point  # HTN - re-start home simvastatin and lisinopril and coreg  # Insomnia - home zolpidem and nortriptyline on hold  # Neuropathy - home pregabalin on hold  # Cirrhosis # Thrombocytopenia Stable. Not followed by GI as outpt. Hx alcoholism, abstinent 15 years now. - outpt GI f/u   DVT prophylaxis: lovenox Code Status: full Family Communication: significant other updated @ bedside 8/21  Level of care: Progressive Cardiac Status is: Inpatient  Remains inpatient appropriate because:Inpatient level of care appropriate due to severity of illness  Dispo: The patient is from: Home              Anticipated d/c is to: tbd, pt/ot consults pending               Patient currently is not medically stable to d/c.   Difficult to place patient No        Consultants:  ID, gen surg  Procedures: Hartmann's procedure 8/18  Antimicrobials:  See above    Subjective: This morning pain moderate. Tolerated clears.  Objective: Vitals:   04/11/21 0420 04/11/21 0501 04/11/21 0749 04/11/21 1116  BP: (!) 159/79  134/74 (!) 155/79  Pulse: 79  79 74  Resp: '13  15 20  '$ Temp: 97.6 F (36.4 C)  98.7 F (37.1 C) 98.2 F (36.8 C)  TempSrc: Oral     SpO2: 100%  97% 100%  Weight:  84 kg    Height:  5' 7.01" (1.702 m)      Intake/Output Summary (Last 24 hours) at 04/11/2021 1225 Last data filed at 04/11/2021 0930 Gross per 24 hour  Intake 867.01 ml  Output 1610 ml  Net -742.99 ml   Filed Weights   04/09/21 0455 04/10/21 0500 04/11/21 0501  Weight: 87.6 kg 88.5 kg 84 kg    Examination:  General exam: Appears calm and comfortable  Respiratory system: Clear to auscultation. Respiratory effort normal. Cardiovascular system: S1 & S2 heard, RRR. No JVD, murmurs, rubs, gallops or clicks. No pedal edema. Gastrointestinal system: Abdomen is distended. Ostomy with output. Midline surgical incision intact. Mild tenderness throughout Central nervous system: Alert and oriented. No focal neurological deficits. Extremities: Symmetric 5 x 5 power. Skin: No rashes, lesions or ulcers Psychiatry: Judgement and insight appear normal. Mood &  affect appropriate.     Data Reviewed: I have personally reviewed following labs and imaging studies  CBC: Recent Labs  Lab 04/08/21 0219 04/08/21 1438 04/09/21 0410 04/10/21 0617 04/11/21 0417  WBC 11.7* 9.0 4.1 4.0 3.7*  NEUTROABS 9.7*  --   --   --   --   HGB 14.5 12.8* 12.1* 11.8* 13.2  HCT 43.1 38.2* 35.2* 35.5* 39.6  MCV 83.5 82.3 84.4 84.1 84.1  PLT 121* 87* 61* 71* 87*   Basic Metabolic Panel: Recent Labs  Lab 04/08/21 1438 04/08/21 1706 04/09/21 0410 04/10/21 0617 04/11/21 0417  NA 133* 136  136 136 136  K 4.0 3.7 3.9 3.7 3.6  CL 110 112* 111 106 107  CO2 18* 18* 19* 21* 21*  GLUCOSE 195* 175* 127* 117* 102*  BUN 42* 38* 25* 19 16  CREATININE 1.72* 1.58* 1.13 0.96 0.77  CALCIUM 7.4* 7.5* 7.5* 7.7* 8.1*  MG  --  2.2 2.1  --  1.7  PHOS  --  3.1 1.7* 1.8* 2.2*   GFR: Estimated Creatinine Clearance: 100.5 mL/min (by C-G formula based on SCr of 0.77 mg/dL). Liver Function Tests: Recent Labs  Lab 04/08/21 0219 04/08/21 1438 04/10/21 0617 04/11/21 0417  AST 32 '19 25 24  '$ ALT 34 '24 24 25  '$ ALKPHOS 41 34* 40 47  BILITOT 2.4* 1.8* 2.0* 1.4*  PROT 6.3* 5.3* 5.5* 6.1*  ALBUMIN 3.5 2.8* 2.8* 2.9*   Recent Labs  Lab 04/08/21 0219  LIPASE 27  AMYLASE 62   Recent Labs  Lab 04/08/21 0231  AMMONIA 24   Coagulation Profile: Recent Labs  Lab 04/08/21 0219 04/10/21 0617 04/11/21 0417  INR 1.6* 1.4* 1.2   Cardiac Enzymes: No results for input(s): CKTOTAL, CKMB, CKMBINDEX, TROPONINI in the last 168 hours. BNP (last 3 results) No results for input(s): PROBNP in the last 8760 hours. HbA1C: No results for input(s): HGBA1C in the last 72 hours. CBG: Recent Labs  Lab 04/10/21 1929 04/11/21 0020 04/11/21 0426 04/11/21 0751 04/11/21 1119  GLUCAP 111* 101* 100* 99 146*   Lipid Profile: No results for input(s): CHOL, HDL, LDLCALC, TRIG, CHOLHDL, LDLDIRECT in the last 72 hours. Thyroid Function Tests: No results for input(s): TSH, T4TOTAL, FREET4, T3FREE, THYROIDAB in the last 72 hours. Anemia Panel: No results for input(s): VITAMINB12, FOLATE, FERRITIN, TIBC, IRON, RETICCTPCT in the last 72 hours. Urine analysis:    Component Value Date/Time   COLORURINE YELLOW (A) 04/08/2021 0222   APPEARANCEUR CLEAR (A) 04/08/2021 0222   LABSPEC 1.010 04/08/2021 0222   PHURINE 6.0 04/08/2021 0222   GLUCOSEU >=500 (A) 04/08/2021 0222   HGBUR NEGATIVE 04/08/2021 0222   BILIRUBINUR NEGATIVE 04/08/2021 0222   BILIRUBINUR NEG 12/31/2014 1649   KETONESUR NEGATIVE 04/08/2021 0222    PROTEINUR NEGATIVE 04/08/2021 0222   UROBILINOGEN 2.0 12/31/2014 1649   NITRITE NEGATIVE 04/08/2021 0222   LEUKOCYTESUR NEGATIVE 04/08/2021 0222   Sepsis Labs: '@LABRCNTIP'$ (procalcitonin:4,lacticidven:4)  ) Recent Results (from the past 240 hour(s))  Resp Panel by RT-PCR (Flu A&B, Covid) Nasopharyngeal Swab     Status: None   Collection Time: 04/08/21  2:22 AM   Specimen: Nasopharyngeal Swab; Nasopharyngeal(NP) swabs in vial transport medium  Result Value Ref Range Status   SARS Coronavirus 2 by RT PCR NEGATIVE NEGATIVE Final    Comment: (NOTE) SARS-CoV-2 target nucleic acids are NOT DETECTED.  The SARS-CoV-2 RNA is generally detectable in upper respiratory specimens during the acute phase of infection. The lowest concentration of SARS-CoV-2 viral copies  this assay can detect is 138 copies/mL. A negative result does not preclude SARS-Cov-2 infection and should not be used as the sole basis for treatment or other patient management decisions. A negative result may occur with  improper specimen collection/handling, submission of specimen other than nasopharyngeal swab, presence of viral mutation(s) within the areas targeted by this assay, and inadequate number of viral copies(<138 copies/mL). A negative result must be combined with clinical observations, patient history, and epidemiological information. The expected result is Negative.  Fact Sheet for Patients:  EntrepreneurPulse.com.au  Fact Sheet for Healthcare Providers:  IncredibleEmployment.be  This test is no t yet approved or cleared by the [place] FDA and  has been authorized for detection and/or diagnosis of SARS-CoV-2 by FDA under an Emergency Use Authorization (EUA). This EUA will remain  in effect (meaning this test can be used) for the duration of the COVID-19 declaration under Section 564(b)(1) of the Act, 21 U.S.C.section 360bbb-3(b)(1), unless the authorization is  terminated  or revoked sooner.       Influenza A by PCR NEGATIVE NEGATIVE Final   Influenza B by PCR NEGATIVE NEGATIVE Final    Comment: (NOTE) The Xpert Xpress SARS-CoV-2/FLU/RSV plus assay is intended as an aid in the diagnosis of influenza from Nasopharyngeal swab specimens and should not be used as a sole basis for treatment. Nasal washings and aspirates are unacceptable for Xpert Xpress SARS-CoV-2/FLU/RSV testing.  Fact Sheet for Patients: EntrepreneurPulse.com.au  Fact Sheet for Healthcare Providers: IncredibleEmployment.be  This test is not yet approved or cleared by the [place] FDA and has been authorized for detection and/or diagnosis of SARS-CoV-2 by FDA under an Emergency Use Authorization (EUA). This EUA will remain in effect (meaning this test can be used) for the duration of the COVID-19 declaration under Section 564(b)(1) of the Act, 21 U.S.C. section 360bbb-3(b)(1), unless the authorization is terminated or revoked.  Performed at [facility], [address] [place], [place] [address]   Culture, blood (Routine X 2) w Reflex to ID Panel     Status: None (Preliminary result)   Collection Time: 04/08/21  3:43 AM   Specimen: BLOOD  Result Value Ref Range Status   Specimen Description BLOOD LEFT [facility]  Final   Special Requests   Final    BOTTLES DRAWN AEROBIC AND ANAEROBIC Blood Culture adequate volume   Culture   Final    NO GROWTH 3 DAYS Performed at [facility], [address], [place], [place] [address]    Report Status PENDING  Incomplete  Culture, blood (Routine X 2) w Reflex to ID Panel     Status: None (Preliminary result)   Collection Time: 04/08/21  3:43 AM   Specimen: BLOOD  Result Value Ref Range Status   Specimen Description BLOOD RIGHT FA  Final   Special Requests   Final    BOTTLES DRAWN AEROBIC AND ANAEROBIC Blood Culture adequate volume   Culture   Final    NO GROWTH 3  DAYS Performed at [facility], [address], [place], [place] [address]    Report Status PENDING  Incomplete  Urine Culture     Status: Abnormal   Collection Time: 04/08/21  3:43 AM   Specimen: Urine, Clean Catch  Result Value Ref Range Status   Specimen Description   Final    URINE, CLEAN CATCH Performed at [facility], [address] [place], [place] [address]    Special Requests   Final    NONE  Performed at [facility], [address] [place], [place] [address]    Culture (A)  Final    <10,000 COLONIES/mL INSIGNIFICANT GROWTH Performed at [facility] [address], [place], [place] [address]    Report Status 04/09/2021 FINAL  Final  MRSA Next Gen by PCR, Nasal     Status: None   Collection Time: 04/08/21  6:34 AM   Specimen: Nasal Mucosa; Nasal Swab  Result Value Ref Range Status   MRSA by PCR Next Gen NOT DETECTED NOT DETECTED Final    Comment: (NOTE) The GeneXpert MRSA Assay (FDA approved for NASAL specimens only), is one component of a comprehensive MRSA colonization surveillance program. It is not intended to diagnose MRSA infection nor to guide or monitor treatment for MRSA infections. Test performance is not FDA approved in patients less than 38 years old. Performed at [facility], [address] [place], [place] [address]   Aerobic/Anaerobic Culture w Gram Stain (surgical/deep wound)     Status: None (Preliminary result)   Collection Time: 04/08/21 10:02 AM   Specimen: Other Source; Body Fluid  Result Value Ref Range Status   Specimen Description   Final    PERITONEAL Performed at [facility], [address] [place] [place] [address], [place] [address]    Special Requests SWAB  Final   Gram Stain   Final    FEW WBC PRESENT,BOTH PMN AND MONONUCLEAR NO ORGANISMS SEEN    Culture   Final    NO GROWTH 3 DAYS NO ANAEROBES ISOLATED; CULTURE IN PROGRESS FOR 5 DAYS Performed at [facility], [place] [place], [place] [address]    Report Status PENDING  Incomplete         Radiology Studies: No results found.      Scheduled Meds:  sodium chloride   Intravenous Once   Chlorhexidine Gluconate Cloth  6 each Topical Q0600   enoxaparin (LOVENOX) injection  40 mg Subcutaneous Q24H   insulin aspart  3-9 Units Subcutaneous Q4H   insulin detemir  10 Units Subcutaneous Daily   pantoprazole (PROTONIX) IV  40 mg Intravenous Q24H   Continuous Infusions:  sodium chloride Stopped (04/08/21 0703)   anidulafungin 100 mg (04/10/21 1001)   piperacillin-tazobactam (ZOSYN)  IV 3.375 g (04/11/21 0651)   potassium PHOSPHATE IVPB (in mmol) 20 mmol (04/11/21 1203)     LOS: 3 days    Time spent: 25 min    Desma Maxim, MD [facility]   If 7PM-7AM, please contact night-coverage [email or web address] Password [facility] 04/11/2021, 12:25 PM

## 2021-04-12 DIAGNOSIS — E872 Acidosis: Secondary | ICD-10-CM | POA: Diagnosis not present

## 2021-04-12 DIAGNOSIS — A419 Sepsis, unspecified organism: Secondary | ICD-10-CM | POA: Diagnosis not present

## 2021-04-12 DIAGNOSIS — R6520 Severe sepsis without septic shock: Secondary | ICD-10-CM | POA: Diagnosis not present

## 2021-04-12 LAB — TYPE AND SCREEN
ABO/RH(D): A POS
Antibody Screen: NEGATIVE
PT AG Type: NEGATIVE
Unit division: 0
Unit division: 0

## 2021-04-12 LAB — BPAM RBC
Blood Product Expiration Date: 202209212359
Blood Product Expiration Date: 202209242359
Unit Type and Rh: 5100
Unit Type and Rh: 5100

## 2021-04-12 LAB — CBC
HCT: 39.3 % (ref 39.0–52.0)
Hemoglobin: 13.2 g/dL (ref 13.0–17.0)
MCH: 27.6 pg (ref 26.0–34.0)
MCHC: 33.6 g/dL (ref 30.0–36.0)
MCV: 82.2 fL (ref 80.0–100.0)
Platelets: 106 10*3/uL — ABNORMAL LOW (ref 150–400)
RBC: 4.78 MIL/uL (ref 4.22–5.81)
RDW: 14.2 % (ref 11.5–15.5)
WBC: 3.6 10*3/uL — ABNORMAL LOW (ref 4.0–10.5)
nRBC: 0 % (ref 0.0–0.2)

## 2021-04-12 LAB — BASIC METABOLIC PANEL
Anion gap: 6 (ref 5–15)
BUN: 13 mg/dL (ref 8–23)
CO2: 22 mmol/L (ref 22–32)
Calcium: 8.1 mg/dL — ABNORMAL LOW (ref 8.9–10.3)
Chloride: 106 mmol/L (ref 98–111)
Creatinine, Ser: 0.73 mg/dL (ref 0.61–1.24)
GFR, Estimated: >60 mL/min (ref >60)
Glucose, Bld: 140 mg/dL — ABNORMAL HIGH (ref 70–99)
Potassium: 3.7 mmol/L (ref 3.5–5.1)
Sodium: 134 mmol/L — ABNORMAL LOW (ref 135–145)

## 2021-04-12 LAB — HEPATIC FUNCTION PANEL
ALT: 24 U/L (ref 0–44)
AST: 22 U/L (ref 15–41)
Albumin: 2.9 g/dL — ABNORMAL LOW (ref 3.5–5.0)
Alkaline Phosphatase: 51 U/L (ref 38–126)
Bilirubin, Direct: 0.2 mg/dL (ref 0.0–0.2)
Indirect Bilirubin: 0.8 mg/dL (ref 0.3–0.9)
Total Bilirubin: 1 mg/dL (ref 0.3–1.2)
Total Protein: 6 g/dL — ABNORMAL LOW (ref 6.5–8.1)

## 2021-04-12 LAB — PHOSPHORUS: Phosphorus: 2.2 mg/dL — ABNORMAL LOW (ref 2.5–4.6)

## 2021-04-12 LAB — PROTIME-INR
INR: 1.2 (ref 0.8–1.2)
Prothrombin Time: 15.4 seconds — ABNORMAL HIGH (ref 11.4–15.2)

## 2021-04-12 LAB — GLUCOSE, CAPILLARY
Glucose-Capillary: 138 mg/dL — ABNORMAL HIGH (ref 70–99)
Glucose-Capillary: 144 mg/dL — ABNORMAL HIGH (ref 70–99)
Glucose-Capillary: 210 mg/dL — ABNORMAL HIGH (ref 70–99)
Glucose-Capillary: 203 mg/dL — ABNORMAL HIGH (ref 70–99)

## 2021-04-12 LAB — HIV-1/HIV-2 QUAL RNA
HIV-1 RNA, Qualitative: NONREACTIVE
HIV-2 RNA, Qualitative: NONREACTIVE

## 2021-04-12 LAB — MAGNESIUM: Magnesium: 2 mg/dL (ref 1.7–2.4)

## 2021-04-12 MED ORDER — K PHOS MONO-SOD PHOS DI & MONO 155-852-130 MG PO TABS
500.0000 mg | ORAL_TABLET | Freq: Three times a day (TID) | ORAL | Status: AC
  Administered 2021-04-12 (×3): 500 mg via ORAL
  Filled 2021-04-12 (×3): qty 2

## 2021-04-12 MED ORDER — AMOXICILLIN-POT CLAVULANATE 875-125 MG PO TABS: 1.0000 | ORAL_TABLET | Freq: Two times a day (BID) | ORAL | Status: DC

## 2021-04-12 MED ORDER — AMOXICILLIN-POT CLAVULANATE 875-125 MG PO TABS
1.0000 | ORAL_TABLET | Freq: Two times a day (BID) | ORAL | Status: DC
  Administered 2021-04-13: 1 via ORAL
  Filled 2021-04-12: qty 1

## 2021-04-12 NOTE — Consult Note (Addendum)
[facility] for Electrolyte Monitoring and Replacement   Recent Labs: Potassium  Date Value  04/12/2021 3.7 mmol/L  04/25/2012 4.2 mEq/L   Magnesium (mg/dL)  Date Value  04/12/2021 2.0   Calcium (mg/dL)  Date Value  04/12/2021 8.1 (L)  04/25/2012 9.3   Albumin (g/dL)  Date Value  04/12/2021 2.9 (L)  04/16/2019 4.3  04/25/2012 4.1   Phosphorus (mg/dL)  Date Value  04/12/2021 2.2 (L)   Sodium  Date Value  04/12/2021 134 mmol/L (L)  03/06/2020 136 mmol/L  04/25/2012 134 mEq/L (L)   Assessment: Patient is a 60 y/o M with medical history including diabetes, alcohol abuse, HTN, HLD, bipolar disorder, ADHD, anxiety, peripheral neuropathy, diastolic HF who is admitted with sepsis. Patient taken to OR 8/18 for colon perforation and ischemia, s/p Hartmann's procedure.   Pharmacy consulted to assist with electrolyte monitoring and replacement as indicated.   Goal of Therapy:  Electrolytes within normal limits  Plan:  K 3.7  Mag 2.0  Phos 2.2  Scr 0.77  Na 136  --will order Kphos tabs 2 tabs x 3 --Continue to follow along. Recheck with AM labs  Patrick Elliott, PharmD, BCPS Clinical Pharmacist 04/12/2021 7:51 AM

## 2021-04-12 NOTE — Progress Notes (Signed)
Physical Therapy Treatment Patient Details Name: Patrick Elliott MRN: [id number] DOB: Mar 13, 1961 Today's Date: 04/12/2021    History of Present Illness Pt is a 60 y/o M admitted on 04/08/21 with c/o of vomiting, abdominal pain & hematochezia. pt found to have colon perforation with peritonitis likely 2/2 ischemic bowel. 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    PT Comments    Pt is making good progress towards goals with ability to ambulate further distance this session. Pt also able to perform stair training to demonstrate safe entry into home. Pt complains of moderate pain with exertion. Upgrading disposition to out patient PT. Care team notified. Will continue to progress as able.   Follow Up Recommendations  Outpatient PT;Supervision - Intermittent     Equipment Recommendations       Recommendations for Other Services       Precautions / Restrictions Precautions Precautions: Fall Restrictions Weight Bearing Restrictions: No    Mobility  Bed Mobility Overal bed mobility: Needs Assistance Bed Mobility: Rolling;Sidelying to Sit Rolling: Supervision Sidelying to sit: Supervision       General bed mobility comments: improved technique. Mod pain noted. Once seated at EOB, able to sit with upright posture    Transfers Overall transfer level: Needs assistance Equipment used: Rolling walker (2 wheeled) Transfers: Sit to/from Stand Sit to Stand: Supervision         General transfer comment: safe technique with upright posture. RW used  Ambulation/Gait Ambulation/Gait assistance: Counsellor (Feet): 150 Feet Assistive device: Rolling walker (2 wheeled) Gait Pattern/deviations: Step-through pattern     General Gait Details: ambulated with reciprocal gait pattern with RW. Upright posture noted. Slight pain noted with exertion   Stairs Stairs: Yes Stairs assistance: Min guard Stair Management: One rail Left;Step to  pattern Number of Stairs: 4 General stair comments: safe technique with step to gait pattern. Use of railing with safe technique. No buckling noted   Wheelchair Mobility    Modified Rankin (Stroke Patients Only)       Balance Overall balance assessment: Needs assistance Sitting-balance support: Bilateral upper extremity supported;Feet supported Sitting balance-Leahy Scale: Good     Standing balance support: No upper extremity supported;During functional activity Standing balance-Leahy Scale: Fair                              Cognition Arousal/Alertness: Awake/alert Behavior During Therapy: WFL for tasks assessed/performed Overall Cognitive Status: Within Functional Limits for tasks assessed                                 General Comments: pleasant and agreeable to session      Exercises      General Comments        Pertinent Vitals/Pain Pain Assessment: Faces Faces Pain Scale: Hurts even more Pain Location: abdomen Pain Descriptors / Indicators: Discomfort;Grimacing Pain Intervention(s): Limited activity within patient's tolerance;Patient requesting pain meds-RN notified;RN gave pain meds during session    Home Living                      Prior Function            PT Goals (current goals can now be found in the care plan section) Acute Rehab PT Goals Patient Stated Goal: go home PT Goal Formulation: With patient Time For  Goal Achievement: 04/25/21 Potential to Achieve Goals: Good Progress towards PT goals: Progressing toward goals    Frequency    Min 2X/week      PT Plan Current plan remains appropriate    Co-evaluation              AM-PAC PT "6 Clicks" Mobility   Outcome Measure  Help needed turning from your back to your side while in a flat bed without using bedrails?: A Little Help needed moving from lying on your back to sitting on the side of a flat bed without using bedrails?: A Little Help  needed moving to and from a bed to a chair (including a wheelchair)?: A Little Help needed standing up from a chair using your arms (e.g., wheelchair or bedside chair)?: A Little Help needed to walk in hospital room?: A Little Help needed climbing 3-5 steps with a railing? : A Little 6 Click Score: 18    End of Session Equipment Utilized During Treatment: Gait belt Activity Tolerance: Patient tolerated treatment well Patient left: in bed Nurse Communication: Mobility status PT Visit Diagnosis: Unsteadiness on feet (R26.81);Muscle weakness (generalized) (M62.81);Pain Pain - Right/Left: Left Pain - part of body:  (abdomen)     Time: [id number] PT Time Calculation (min) (ACUTE ONLY): 17 min  Charges:  $Gait Training: 8-22 mins                     Greggory Stallion, PT, DPT [phone]    Brentyn Seehafer 04/12/2021, 4:23 PM

## 2021-04-12 NOTE — Consult Note (Signed)
[place] Nurse ostomy follow up Patient receiving care in [facility]. Stoma type/location: LUQ colostomy Stomal assessment/size: 1.5 inches, round, moist, red, some slough along inferior stoma Peristomal assessment: intact Treatment options for stomal/peristomal skin: barrier ring Output: thin brown stool Ostomy pouching: 2pc. 2 3/4 inches skin barrier Kellie Simmering #2), pouch Kellie Simmering [phone]), and barrier ring Kellie Simmering [phone]) used today as this is what was available. Education provided:  Patient shown how to open, empty, clean bottom of pouch. Patient participated in removing old pouch, observed skin cleansing, participated in placing barrier ring around stoma and skin barrier on abdomen, as well as, snapping pouch onto barrier. I explained to change the pouch twice weekly, and empty it when 1/3 full. Also, to wash the skin with water only. He verbalized his understanding of all instructions. [place] education folder provided. Enrolled patient in [place] Discharge program: Yes, today. It is to be sent to his girlfriend's home at [place], [place].  The plan is to send him to a SNF for rehab before home according to the MD note.  Val Riles, RN, MSN, CWOCN, CNS-BC, pager [phone]

## 2021-04-12 NOTE — TOC Progression Note (Addendum)
Transition of Care [facility]) - Progression Note    Patient Details  Name: Patrick Elliott MRN: [id number] Date of Birth: 08-07-1961  Transition of Care [facility]) CM/SW Contact  Eileen Stanford, LCSW Phone Number: 04/12/2021, 2:51 PM  Clinical Narrative:  CSW unable to find a SNF and [place] agency to take pt due to insurance. Pt understanding and states he is agreeable to outpatient therapy referral to the [facility] location. Ostomy supplies and walker ordered through [employer]. Ostomy supplies will be delivered to the home.    Expected Discharge Plan: Home/Self Care Barriers to Discharge: Continued Medical Work up  Expected Discharge Plan and Services Expected Discharge Plan: Home/Self Care       Living arrangements for the past 2 months: Single Family Home                                       Social Determinants of Health (SDOH) Interventions    Readmission Risk Interventions Readmission Risk Prevention Plan 04/10/2021  Transportation Screening Complete  PCP or Specialist Appt within 3-5 Days Complete  HRI or [place] Complete  Social Work Consult for [place] Planning/Counseling Complete  Palliative Care Screening Not Applicable  Medication Review Press photographer) Complete  Some recent data might be hidden

## 2021-04-12 NOTE — TOC Progression Note (Signed)
Transition of Care [facility]) - Progression Note    Patient Details  Name: Patrick Elliott MRN: [id number] Date of Birth: September 23, 1960  Transition of Care [facility]) CM/SW Contact  Eileen Stanford, LCSW Phone Number: 04/12/2021, 3:01 PM  Clinical Narrative:   Outpatient referral faxed to [facility]--they will follow up with pt on appointment.   Expected Discharge Plan: Home/Self Care Barriers to Discharge: Continued Medical Work up  Expected Discharge Plan and Services Expected Discharge Plan: Home/Self Care       Living arrangements for the past 2 months: Single Family Home                                       Social Determinants of Health (SDOH) Interventions    Readmission Risk Interventions Readmission Risk Prevention Plan 04/10/2021  Transportation Screening Complete  PCP or Specialist Appt within 3-5 Days Complete  HRI or [place] Complete  Social Work Consult for [place] Planning/Counseling Complete  Palliative Care Screening Not Applicable  Medication Review Press photographer) Complete  Some recent data might be hidden

## 2021-04-12 NOTE — Progress Notes (Signed)
Date of Admission:  04/08/2021     ID: Patrick Elliott is a 60 y.o. male  Active Problems:   Severe sepsis with lactic acidosis (HCC)   Cirrhosis ([place])    Subjective: Doing better No abdominal pain No fever Appetite better No GI bleed He participated with PT.  Medications:   sodium chloride   Intravenous Once   carvedilol  6.25 mg Oral BID WC   Chlorhexidine Gluconate Cloth  6 each Topical Q0600   enoxaparin (LOVENOX) injection  40 mg Subcutaneous Q24H   insulin aspart  0-15 Units Subcutaneous TID WC   insulin glargine-yfgn  10 Units Subcutaneous Daily   lamoTRIgine  100 mg Oral Daily   lisinopril  10 mg Oral Daily   lithium carbonate  450 mg Oral Daily   pantoprazole (PROTONIX) IV  40 mg Intravenous Q24H   phosphorus  500 mg Oral TID   simvastatin  20 mg Oral Daily    Objective: Vital signs in last 24 hours: Temp:  [97.8 F (36.6 C)-98.7 F (37.1 C)] 98.4 F (36.9 C) (08/22 0819) Pulse Rate:  [67-73] 70 (08/22 0819) Resp:  [15-17] 17 (08/22 0819) BP: (130-152)/(63-80) 130/69 (08/22 0819) SpO2:  [97 %-100 %] 97 % (08/22 0819)  PHYSICAL EXAM:  General: Alert, cooperative, no distress, appears stated age.  Head: Normocephalic, without obvious abnormality, atraumatic. Eyes: Conjunctivae clear, anicteric sclerae. Pupils are equal ENT Nares normal. No drainage or sinus tenderness. Lips, mucosa, and tongue normal. No Thrush Neck: Supple, symmetrical, no adenopathy, thyroid: non tender no carotid bruit and no JVD. Back: No CVA tenderness. Lungs: Clear to auscultation bilaterally. No Wheezing or Rhonchi. No rales. Heart: Regular rate and rhythm, no murmur, rub or gallop. Abdomen: Soft, laparoscopic surgical incision site JP drain present Colostomy Extremities: atraumatic, no cyanosis. No edema. No clubbing Skin: No rashes or lesions. Or bruising Lymph: Cervical, supraclavicular normal. Neurologic: Grossly non-focal  Lab Results Recent Labs    04/11/21 0417  04/12/21 0431  WBC 3.7* 3.6*  HGB 13.2 13.2  HCT 39.6 39.3  NA 136 134*  K 3.6 3.7  CL 107 106  CO2 21* 22  BUN 16 13  CREATININE 0.77 0.73   Liver Panel Recent Labs    04/11/21 0417 04/12/21 0431  PROT 6.1* 6.0*  ALBUMIN 2.9* 2.9*  AST 24 22  ALT 25 24  ALKPHOS 47 51  BILITOT 1.4* 1.0  BILIDIR 0.3* 0.2  IBILI 1.1* 0.8   Sedimentation Rate No results for input(s): ESRSEDRATE in the last 72 hours. C-Reactive Protein No results for input(s): CRP in the last 72 hours.  Microbiology:  Studies/Results: No results found.   Assessment/Plan: Septic shock secondary to sigmoid colon perforation due to ischemic colitis..  Underwent laparotomy and resection of the necrosed bowel with colostomy.  Much better.  Pathology showed ischemic colitis with transmural inflammation, serositis and fibrinous serosal adhesions.  Culture negative. Currently on Zosyn and anidulafungin.  We can discontinue both and switch him to p.o. Augmentin for 12moe days.until 04/18/21 Patient has nausea with Augmentin but allergic reactions.  T.  Can be given with Zofran if needed.  Explained to the patient that he can take the medicine after he eats something..  Lower GI bleed secondary to ischemic colitis.  Hemoglobin has been stable  History of alcoholic cirrhosis with portal hypertension.  Abstaining from alcohol for many years now.  Diabetes mellitus was on insulin and metformin at home.  Patient has admitted with mild DKA.  This  is resolved now.  Routine HIV test showed a preliminary reactive result but HIV RNA is negative.  Hence is a false positive test result.  He does not have any HIV infection.  Bipolar disorder.  On lithium.  Discussed the management with the patient and the care team. ID will sign off.  Call if needed.

## 2021-04-12 NOTE — Clinical Social Work Note (Signed)
[place] REFERRAL  Occupational Therapy * Physical Therapy * Speech Therapy          DATE ___8/22/22________________  PATIENT NAME____[name]_____________  PATIENT MRN___[id number]_________________  DIAGNOSIS/DIAGNOSIS CODE __Severe sepsis with lactic acidosis __A41.9, E87.2, R65.20___  DATE OF DISCHARGE: ___8/22/22___________  PRIMARY CARE PHYSICIAN __Poonam Patel_____________    PCP PHONE/FAX_____[phone]______________________    Dear Provider (Name: ____________________   Fax: ___________________________):   I certify that I have examined this patient and that occupational/physical/speech therapy is necessary on an outpatient basis.    The patient has expressed interest in completing their recommended course of therapy at your  location.  Once a formal order from the patient's primary care physician has been obtained, please contact him/her to schedule an appointment for evaluation at your earliest convenience.   [ * ]  Physical Therapy Evaluate and Treat          [ * ]  Occupational Therapy Evaluate and Treat                        [  ]  Speech Therapy Evaluate and Treat     The patient's primary care physician (listed above) must furnish and be responsible for a formal order such that the recommended services may be furnished while under the primary physician's care, and that the plan of care will be established and reviewed every 30 days (or more often if condition necessitates).  MD's electronic signature below

## 2021-04-12 NOTE — Progress Notes (Addendum)
PROGRESS NOTE    Patrick Elliott  [id number] DOB: October 01, 1960 DOA: 04/08/2021 PCP: Lattie Haw, MD  Outpatient Specialists: endocrinology    Brief Narrative:   Admitted 8/18 with one day of vomiting, abdominal pain, and hematochezia. Found to have colon perforation with peritonitis, likely 2/2 ischemic bowel. Is now s/p open washout with sigmoid colectomy and colostomy on 8/18. Transferred to hospitalist service 8/20.    Assessment & Plan:   Active Problems:   Severe sepsis with lactic acidosis (HCC)   Cirrhosis ([place])  # Ischemic colitis with perforation # Peritonitis # Sepsis # GI bleeding S/p washout with Hartman's procedure 8/18. ID following. NG tube discontinued 8/20. Hgb stable, no further bleeding. Diet advanced - discussed w/ ID, will transition to oral augmentin to take for 7 more days - qualifies only for outpatient pt, referral made - has received ostomy teaching, will d/c with some supplies, others will be mailed to him, also referred to [facility] outpt ostomy clinic - will need order for walker @ d/c  # T2DM Mild DKA on presentation, now resolved - home jardiance, aspart, metformin on hold - SSI, lantus 10  # HIV positive screening test False positive, rna is neg  # Bipolar disorder - re-started home lithium and lamotrigine   # ADHD - re-start home vyvanse and ritalin at some point  # HTN - re-started home simvastatin and lisinopril and coreg  # Insomnia - home zolpidem and nortriptyline on hold  # Neuropathy - home pregabalin on hold  # Cirrhosis # Thrombocytopenia Stable. Not followed by GI as outpt. Hx alcoholism, abstinent 15 years now. - outpt GI f/u   DVT prophylaxis: lovenox Code Status: full Family Communication: significant other updated @ bedside 8/21  Level of care: Med-Surg Status is: Inpatient  Remains inpatient appropriate because:Unsafe d/c plan  Dispo: The patient is from: Home              Anticipated d/c is to:  tbd, pt/ot consults pending              Patient currently stable for d/c home, no safe d/c plan today, plan for tomorrow   Difficult to place patient No        Consultants:  ID, gen surg  Procedures: Hartmann's procedure 8/18  Antimicrobials:  See above    Subjective: This morning pain moderate. Tolerated reg diet.  Objective: Vitals:   04/12/21 0108 04/12/21 0439 04/12/21 0819 04/12/21 1149  BP: (!) 152/80 140/73 130/69 139/73  Pulse: 72 72 70 73  Resp:  '15 17 18  '$ Temp: 98.5 F (36.9 C) 98.7 F (37.1 C) 98.4 F (36.9 C) 97.8 F (36.6 C)  TempSrc: Oral Oral Oral   SpO2: 98% 99% 97% 99%  Weight:      Height:        Intake/Output Summary (Last 24 hours) at 04/12/2021 1346 Last data filed at 04/12/2021 1149 Gross per 24 hour  Intake 693.58 ml  Output 2830 ml  Net -2136.42 ml   Filed Weights   04/09/21 0455 04/10/21 0500 04/11/21 0501  Weight: 87.6 kg 88.5 kg 84 kg    Examination:  General exam: Appears calm and comfortable  Respiratory system: Clear to auscultation. Respiratory effort normal. Cardiovascular system: S1 & S2 heard, RRR. No JVD, murmurs, rubs, gallops or clicks. No pedal edema. Gastrointestinal system: Abdomen is distended. Ostomy with output. Midline surgical incision intact. Mild tenderness throughout Central nervous system: Alert and oriented. No focal neurological deficits. Extremities: Symmetric 5  x 5 power. Skin: No rashes, lesions or ulcers Psychiatry: Judgement and insight appear normal. Mood & affect appropriate.     Data Reviewed: I have personally reviewed following labs and imaging studies  CBC: Recent Labs  Lab 04/08/21 0219 04/08/21 1438 04/09/21 0410 04/10/21 0617 04/11/21 0417 04/12/21 0431  WBC 11.7* 9.0 4.1 4.0 3.7* 3.6*  NEUTROABS 9.7*  --   --   --   --   --   HGB 14.5 12.8* 12.1* 11.8* 13.2 13.2  HCT 43.1 38.2* 35.2* 35.5* 39.6 39.3  MCV 83.5 82.3 84.4 84.1 84.1 82.2  PLT 121* 87* 61* 71* 87* 106*    Basic Metabolic Panel: Recent Labs  Lab 04/08/21 1706 04/09/21 0410 04/10/21 0617 04/11/21 0417 04/12/21 0431  NA 136 136 136 136 134*  K 3.7 3.9 3.7 3.6 3.7  CL 112* 111 106 107 106  CO2 18* 19* 21* 21* 22  GLUCOSE 175* 127* 117* 102* 140*  BUN 38* 25* '19 16 13  '$ CREATININE 1.58* 1.13 0.96 0.77 0.73  CALCIUM 7.5* 7.5* 7.7* 8.1* 8.1*  MG 2.2 2.1  --  1.7 2.0  PHOS 3.1 1.7* 1.8* 2.2* 2.2*   GFR: Estimated Creatinine Clearance: 100.5 mL/min (by C-G formula based on SCr of 0.73 mg/dL). Liver Function Tests: Recent Labs  Lab 04/08/21 0219 04/08/21 1438 04/10/21 0617 04/11/21 0417 04/12/21 0431  AST 32 '19 25 24 22  '$ ALT 34 '24 24 25 24  '$ ALKPHOS 41 34* 40 47 51  BILITOT 2.4* 1.8* 2.0* 1.4* 1.0  PROT 6.3* 5.3* 5.5* 6.1* 6.0*  ALBUMIN 3.5 2.8* 2.8* 2.9* 2.9*   Recent Labs  Lab 04/08/21 0219  LIPASE 27  AMYLASE 62   Recent Labs  Lab 04/08/21 0231  AMMONIA 24   Coagulation Profile: Recent Labs  Lab 04/08/21 0219 04/10/21 0617 04/11/21 0417 04/12/21 0431  INR 1.6* 1.4* 1.2 1.2   Cardiac Enzymes: No results for input(s): CKTOTAL, CKMB, CKMBINDEX, TROPONINI in the last 168 hours. BNP (last 3 results) No results for input(s): PROBNP in the last 8760 hours. HbA1C: No results for input(s): HGBA1C in the last 72 hours. CBG: Recent Labs  Lab 04/11/21 1119 04/11/21 1620 04/11/21 2010 04/12/21 0814 04/12/21 1144  GLUCAP 146* 207* 197* 138* 144*   Lipid Profile: No results for input(s): CHOL, HDL, LDLCALC, TRIG, CHOLHDL, LDLDIRECT in the last 72 hours. Thyroid Function Tests: No results for input(s): TSH, T4TOTAL, FREET4, T3FREE, THYROIDAB in the last 72 hours. Anemia Panel: No results for input(s): VITAMINB12, FOLATE, FERRITIN, TIBC, IRON, RETICCTPCT in the last 72 hours. Urine analysis:    Component Value Date/Time   COLORURINE YELLOW (A) 04/08/2021 0222   APPEARANCEUR CLEAR (A) 04/08/2021 0222   LABSPEC 1.010 04/08/2021 0222   PHURINE 6.0 04/08/2021  0222   GLUCOSEU >=500 (A) 04/08/2021 0222   HGBUR NEGATIVE 04/08/2021 0222   BILIRUBINUR NEGATIVE 04/08/2021 0222   BILIRUBINUR NEG 12/31/2014 1649   KETONESUR NEGATIVE 04/08/2021 0222   PROTEINUR NEGATIVE 04/08/2021 0222   UROBILINOGEN 2.0 12/31/2014 1649   NITRITE NEGATIVE 04/08/2021 0222   LEUKOCYTESUR NEGATIVE 04/08/2021 0222   Sepsis Labs: '@LABRCNTIP'$ (procalcitonin:4,lacticidven:4)  ) Recent Results (from the past 240 hour(s))  Resp Panel by RT-PCR (Flu A&B, Covid) Nasopharyngeal Swab     Status: None   Collection Time: 04/08/21  2:22 AM   Specimen: Nasopharyngeal Swab; Nasopharyngeal(NP) swabs in vial transport medium  Result Value Ref Range Status   SARS Coronavirus 2 by RT PCR NEGATIVE NEGATIVE Final  Comment: (NOTE) SARS-CoV-2 target nucleic acids are NOT DETECTED.  The SARS-CoV-2 RNA is generally detectable in upper respiratory specimens during the acute phase of infection. The lowest concentration of SARS-CoV-2 viral copies this assay can detect is 138 copies/mL. A negative result does not preclude SARS-Cov-2 infection and should not be used as the sole basis for treatment or other patient management decisions. A negative result may occur with  improper specimen collection/handling, submission of specimen other than nasopharyngeal swab, presence of viral mutation(s) within the areas targeted by this assay, and inadequate number of viral copies(<138 copies/mL). A negative result must be combined with clinical observations, patient history, and epidemiological information. The expected result is Negative.  Fact Sheet for Patients:  EntrepreneurPulse.com.au  Fact Sheet for Healthcare Providers:  IncredibleEmployment.be  This test is no t yet approved or cleared by the [place] FDA and  has been authorized for detection and/or diagnosis of SARS-CoV-2 by FDA under an Emergency Use Authorization (EUA). This EUA will remain   in effect (meaning this test can be used) for the duration of the COVID-19 declaration under Section 564(b)(1) of the Act, 21 U.S.C.section 360bbb-3(b)(1), unless the authorization is terminated  or revoked sooner.       Influenza A by PCR NEGATIVE NEGATIVE Final   Influenza B by PCR NEGATIVE NEGATIVE Final    Comment: (NOTE) The Xpert Xpress SARS-CoV-2/FLU/RSV plus assay is intended as an aid in the diagnosis of influenza from Nasopharyngeal swab specimens and should not be used as a sole basis for treatment. Nasal washings and aspirates are unacceptable for Xpert Xpress SARS-CoV-2/FLU/RSV testing.  Fact Sheet for Patients: EntrepreneurPulse.com.au  Fact Sheet for Healthcare Providers: IncredibleEmployment.be  This test is not yet approved or cleared by the [place] FDA and has been authorized for detection and/or diagnosis of SARS-CoV-2 by FDA under an Emergency Use Authorization (EUA). This EUA will remain in effect (meaning this test can be used) for the duration of the COVID-19 declaration under Section 564(b)(1) of the Act, 21 U.S.C. section 360bbb-3(b)(1), unless the authorization is terminated or revoked.  Performed at [facility], [address] [place], [place] [address]   Culture, blood (Routine X 2) w Reflex to ID Panel     Status: None (Preliminary result)   Collection Time: 04/08/21  3:43 AM   Specimen: BLOOD  Result Value Ref Range Status   Specimen Description BLOOD LEFT [facility]  Final   Special Requests   Final    BOTTLES DRAWN AEROBIC AND ANAEROBIC Blood Culture adequate volume   Culture   Final    NO GROWTH 4 DAYS Performed at [facility], [address] [place], [place] [address]    Report Status PENDING  Incomplete  Culture, blood (Routine X 2) w Reflex to ID Panel     Status: None (Preliminary result)   Collection Time: 04/08/21  3:43 AM   Specimen: BLOOD  Result Value Ref Range Status    Specimen Description BLOOD RIGHT FA  Final   Special Requests   Final    BOTTLES DRAWN AEROBIC AND ANAEROBIC Blood Culture adequate volume   Culture   Final    NO GROWTH 4 DAYS Performed at [facility], [address], [place], [place] [address]    Report Status PENDING  Incomplete  Urine Culture     Status: Abnormal   Collection Time: 04/08/21  3:43 AM   Specimen: Urine, Clean Catch  Result Value Ref Range Status   Specimen Description  Final    URINE, CLEAN CATCH Performed at [facility], [address] [place], [place] [address]    Special Requests   Final    NONE Performed at [facility], [address] [place], [place] [address]    Culture (A)  Final    <10,000 COLONIES/mL INSIGNIFICANT GROWTH Performed at [place] [address], [place], [place] [address]    Report Status 04/09/2021 FINAL  Final  MRSA Next Gen by PCR, Nasal     Status: None   Collection Time: 04/08/21  6:34 AM   Specimen: Nasal Mucosa; Nasal Swab  Result Value Ref Range Status   MRSA by PCR Next Gen NOT DETECTED NOT DETECTED Final    Comment: (NOTE) The GeneXpert MRSA Assay (FDA approved for NASAL specimens only), is one component of a comprehensive MRSA colonization surveillance program. It is not intended to diagnose MRSA infection nor to guide or monitor treatment for MRSA infections. Test performance is not FDA approved in patients less than 20 years old. Performed at [facility], [address], [place], [place] [address]   Fungus Culture With Stain     Status: None (Preliminary result)   Collection Time: 04/08/21 10:02 AM   Specimen: Other Source; Body Fluid  Result Value Ref Range Status   Fungus Stain Final report  Final    Comment: (NOTE) Performed At: [facility] [place], [place] [id number] Rush Farmer MD [id number]    Fungus (Mycology) Culture PENDING  Incomplete   Fungal Source PERITONEAL   Final    Comment: Performed at [facility], [address] [place], [place] [address]  Aerobic/Anaerobic Culture w Gram Stain (surgical/deep wound)     Status: None (Preliminary result)   Collection Time: 04/08/21 10:02 AM   Specimen: Other Source; Body Fluid  Result Value Ref Range Status   Specimen Description   Final    PERITONEAL Performed at [facility], [address] [place], [place] [address]    Special Requests SWAB  Final   Gram Stain   Final    FEW WBC PRESENT,BOTH PMN AND MONONUCLEAR NO ORGANISMS SEEN    Culture   Final    NO GROWTH 4 DAYS NO ANAEROBES ISOLATED; CULTURE IN PROGRESS FOR 5 DAYS Performed at [facility], [place] [address], [place],  [address]    Report Status PENDING  Incomplete  Fungus Culture Result     Status: None   Collection Time: 04/08/21 10:02 AM  Result Value Ref Range Status   Result 1 Comment  Final    Comment: (NOTE) KOH/Calcofluor preparation:  no fungus observed. Performed At: [facility] [place], [place] [id number] Rush Farmer MD [id number]          Radiology Studies: No results found.      Scheduled Meds:  sodium chloride   Intravenous Once   carvedilol  6.25 mg Oral BID WC   Chlorhexidine Gluconate Cloth  6 each Topical Q0600   enoxaparin (LOVENOX) injection  40 mg Subcutaneous Q24H   insulin aspart  0-15 Units Subcutaneous TID WC   insulin glargine-yfgn  10 Units Subcutaneous Daily   lamoTRIgine  100 mg Oral Daily   lisinopril  10 mg Oral Daily   lithium carbonate  450 mg Oral Daily   pantoprazole (PROTONIX) IV  40 mg Intravenous Q24H   phosphorus  500 mg Oral TID   simvastatin  20 mg Oral Daily  Continuous Infusions:  sodium chloride Stopped (04/08/21 0703)   anidulafungin 100 mg (04/12/21 1250)   piperacillin-tazobactam (ZOSYN)  IV 3.375 g (04/12/21 0626)     LOS: 4 days    Time spent: 25 min    Desma Maxim, MD [facility]   If 7PM-7AM, please contact night-coverage [email or web address] Password [email or web address] 04/12/2021, 1:46 PM

## 2021-04-12 NOTE — Progress Notes (Signed)
Subjective:  CC: Patrick Elliott is a 60 y.o. male  Hospital stay day 4, 4 Days Post-Op hartman's  HPI: No acute issues overnight.  Tolerating full liquid diet  ROS:  General: Denies weight loss, weight gain, fatigue, fevers, chills, and night sweats. Heart: Denies chest pain, palpitations, racing heart, irregular heartbeat, leg pain or swelling, and decreased activity tolerance. Respiratory: Denies breathing difficulty, shortness of breath, wheezing, cough, and sputum. GI: Denies change in appetite, heartburn, nausea, vomiting, constipation, diarrhea, and blood in stool. GU: Denies difficulty urinating, pain with urinating, urgency, frequency, blood in urine.   Objective:   Temp:  [97.8 F (36.6 C)-98.7 F (37.1 C)] 98.7 F (37.1 C) (08/22 0439) Pulse Rate:  [67-74] 72 (08/22 0439) Resp:  [15-20] 15 (08/22 0439) BP: (131-155)/(63-80) 140/73 (08/22 0439) SpO2:  [97 %-100 %] 99 % (08/22 0439)     Height: 5' 7.01" (170.2 cm) Weight: 84 kg BMI (Calculated): 29   Intake/Output this shift:   Intake/Output Summary (Last 24 hours) at 04/12/2021 0809 Last data filed at 04/12/2021 0400 Gross per 24 hour  Intake 1053.58 ml  Output 3230 ml  Net -2176.42 ml    Constitutional :  alert, cooperative, appears stated age, and no distress  Respiratory:  clear to auscultation bilaterally  Cardiovascular:  regular rate and rhythm  Gastrointestinal: Soft, no guarding, minimal  TTP around incision.  Ostomy healthy, pink and productive .   Skin: Cool and moist. Staples C/D/I. JP with serosanguinous outpt.   Psychiatric: Normal affect, non-agitated, not confused       LABS:  CMP Latest Ref Rng & Units 04/12/2021 04/11/2021 04/10/2021  Glucose 70 - 99 mg/dL 140(H) 102(H) 117(H)  BUN 8 - 23 mg/dL '13 16 19  '$ Creatinine 0.61 - 1.24 mg/dL 0.73 0.77 0.96  Sodium 135 - 145 mmol/L 134(L) 136 136  Potassium 3.5 - 5.1 mmol/L 3.7 3.6 3.7  Chloride 98 - 111 mmol/L 106 107 106  CO2 22 - 32 mmol/L 22 21(L)  21(L)  Calcium 8.9 - 10.3 mg/dL 8.1(L) 8.1(L) 7.7(L)  Total Protein 6.5 - 8.1 g/dL 6.0(L) 6.1(L) 5.5(L)  Total Bilirubin 0.3 - 1.2 mg/dL 1.0 1.4(H) 2.0(H)  Alkaline Phos 38 - 126 U/L 51 47 40  AST 15 - 41 U/L '22 24 25  '$ ALT 0 - 44 U/L '24 25 24   '$ CBC Latest Ref Rng & Units 04/12/2021 04/11/2021 04/10/2021  WBC 4.0 - 10.5 K/uL 3.6(L) 3.7(L) 4.0  Hemoglobin 13.0 - 17.0 g/dL 13.2 13.2 11.8(L)  Hematocrit 39.0 - 52.0 % 39.3 39.6 35.5(L)  Platelets 150 - 400 K/uL 106(L) 87(L) 71(L)    RADS: N/a Assessment:   S/p hartman's. Recovering well from surgical standpoint.  Ok to d/c to SNF per PT/OT recs from surgery standpoint. No lift >10lbs, no push/pull >30lbs. Ostomy education and teaching, as well as arrangement for ostomy care needs to be in place at SNF prior to transfer.  Complete abx course. No need to cover staple lines at this point.  4x4 gauze around JP site and secure with tape.  F/u one week with me in office for wound recheck, possible staple removal and/or JP drain removal.  Surgery will peripherally follow for now.  Please call with questions.

## 2021-04-13 DIAGNOSIS — A419 Sepsis, unspecified organism: Secondary | ICD-10-CM | POA: Diagnosis not present

## 2021-04-13 DIAGNOSIS — R6520 Severe sepsis without septic shock: Secondary | ICD-10-CM | POA: Diagnosis not present

## 2021-04-13 DIAGNOSIS — Z933 Colostomy status: Secondary | ICD-10-CM

## 2021-04-13 DIAGNOSIS — E872 Acidosis: Secondary | ICD-10-CM | POA: Diagnosis not present

## 2021-04-13 DIAGNOSIS — K659 Peritonitis, unspecified: Secondary | ICD-10-CM

## 2021-04-13 DIAGNOSIS — K559 Vascular disorder of intestine, unspecified: Secondary | ICD-10-CM

## 2021-04-13 LAB — BASIC METABOLIC PANEL
Anion gap: 7 (ref 5–15)
BUN: 15 mg/dL (ref 8–23)
CO2: 21 mmol/L — ABNORMAL LOW (ref 22–32)
Calcium: 8.4 mg/dL — ABNORMAL LOW (ref 8.9–10.3)
Chloride: 106 mmol/L (ref 98–111)
Creatinine, Ser: 0.74 mg/dL (ref 0.61–1.24)
GFR, Estimated: >60 mL/min (ref >60)
Glucose, Bld: 223 mg/dL — ABNORMAL HIGH (ref 70–99)
Potassium: 3.9 mmol/L (ref 3.5–5.1)
Sodium: 134 mmol/L — ABNORMAL LOW (ref 135–145)

## 2021-04-13 LAB — HIV-1/HIV-2 QUALITATIVE RNA
Final Interpretation: NEGATIVE
HIV-1 RNA, Qualitative: NONREACTIVE
HIV-2 RNA, Qualitative: NONREACTIVE

## 2021-04-13 LAB — CULTURE, BLOOD (ROUTINE X 2)
Culture: NO GROWTH
Culture: NO GROWTH
Special Requests: ADEQUATE
Special Requests: ADEQUATE

## 2021-04-13 LAB — GLUCOSE, CAPILLARY: Glucose-Capillary: 180 mg/dL — ABNORMAL HIGH (ref 70–99)

## 2021-04-13 LAB — PHOSPHORUS: Phosphorus: 3 mg/dL (ref 2.5–4.6)

## 2021-04-13 LAB — AEROBIC/ANAEROBIC CULTURE W GRAM STAIN (SURGICAL/DEEP WOUND): Culture: NO GROWTH

## 2021-04-13 LAB — HIV-1/2 AB - DIFFERENTIATION
HIV 1 Ab: NONREACTIVE
HIV 2 Ab: NONREACTIVE
Note: NEGATIVE

## 2021-04-13 LAB — MAGNESIUM: Magnesium: 1.9 mg/dL (ref 1.7–2.4)

## 2021-04-13 MED ORDER — AMOXICILLIN-POT CLAVULANATE 875-125 MG PO TABS: 1.0000 | ORAL_TABLET | Freq: Two times a day (BID) | ORAL | 0 refills | Status: DC

## 2021-04-13 MED ORDER — PANTOPRAZOLE SODIUM 40 MG PO TBEC
40.0000 mg | DELAYED_RELEASE_TABLET | Freq: Every day | ORAL | Status: DC
  Administered 2021-04-13: 40 mg via ORAL
  Filled 2021-04-13: qty 1

## 2021-04-13 MED ORDER — PREGABALIN 50 MG PO CAPS
100.0000 mg | ORAL_CAPSULE | Freq: Three times a day (TID) | ORAL | Status: DC
  Administered 2021-04-13: 100 mg via ORAL
  Filled 2021-04-13: qty 2

## 2021-04-13 MED ORDER — LANTUS SOLOSTAR 100 UNIT/ML ~~LOC~~ SOPN: PEN_INJECTOR | SUBCUTANEOUS | 11 refills | Status: DC

## 2021-04-13 NOTE — Progress Notes (Signed)
PHARMACIST - PHYSICIAN COMMUNICATION  DR:  Si Raider  CONCERNING: IV to Oral Route Change Policy  RECOMMENDATION: This patient is receiving pantoprazole by the intravenous route.  Based on criteria approved by the Pharmacy and Therapeutics Committee, the intravenous medication(s) is/are being converted to the equivalent oral dose form(s).   DESCRIPTION: These criteria include: The patient is eating (either orally or via tube) and/or has been taking other orally administered medications for a least 24 hours The patient has no evidence of active gastrointestinal bleeding or impaired GI absorption (gastrectomy, short bowel, patient on TNA or NPO).  If you have questions about this conversion, please contact the Pharmacy Department  '[]'$   [phone] )  [facility] '[x]'$   [phone] )  [facility] '[]'$   [phone] )  [facility] '[]'$   [phone] )  [facility] '[]'$   [phone] )  [place], PharmD, BCPS Clinical Pharmacist 04/13/2021 8:59 AM

## 2021-04-13 NOTE — Progress Notes (Signed)
DC paperwork reviewed and given to pt. Ostomy supplies per order were given to pt as well. Pt taken to front entrance via wheelchair and assisted into girlfriends car.

## 2021-04-13 NOTE — Discharge Instructions (Signed)
No lifting greater than 10 pounds, no pushing or pulling greater than 30 pounds

## 2021-04-13 NOTE — Consult Note (Signed)
PHARMACY CONSULT NOTE  Pharmacy Consult for Electrolyte Monitoring and Replacement   Recent Labs: Potassium  Date Value  04/13/2021 3.9 mmol/L  04/25/2012 4.2 mEq/L   Magnesium (mg/dL)  Date Value  04/13/2021 1.9   Calcium (mg/dL)  Date Value  04/13/2021 8.4 (L)  04/25/2012 9.3   Albumin (g/dL)  Date Value  04/12/2021 2.9 (L)  04/16/2019 4.3  04/25/2012 4.1   Phosphorus (mg/dL)  Date Value  04/13/2021 3.0   Sodium  Date Value  04/13/2021 134 mmol/L (L)  03/06/2020 136 mmol/L  04/25/2012 134 mEq/L (L)   Assessment: Patient is a 60 y/o M with medical history including diabetes, alcohol abuse, HTN, HLD, bipolar disorder, ADHD, anxiety, peripheral neuropathy, diastolic HF who is admitted with sepsis. Patient taken to OR 8/18 for colon perforation and ischemia, s/p Hartmann's procedure.   Pharmacy consulted to assist with electrolyte monitoring and replacement as indicated.   Goal of Therapy:  Electrolytes within normal limits  Plan:  K 3.9  Mag 1.9  Phos 3.0  Scr 0.74  Na 134 Pharmacy will sign off at this time  Lu Duffel, PharmD, BCPS Clinical Pharmacist 04/13/2021 8:55 AM

## 2021-04-13 NOTE — Discharge Summary (Signed)
Patrick Elliott BPZ:[id number] DOB: 1961-01-26 DOA: 04/08/2021  PCP: Lattie Haw, MD  Admit date: 04/08/2021 Discharge date: 04/13/2021  Time spent: 35 minutes  Recommendations for Outpatient Follow-up:  Gen surg f/u 1 week  Needs to establish with gi/hepatology for cirrhosis    Discharge Diagnoses:  Active Problems:   Severe sepsis with lactic acidosis (HCC)   Cirrhosis ([place])   Discharge Condition: stable  Diet recommendation: regular  Filed Weights   04/09/21 0455 04/10/21 0500 04/11/21 0501  Weight: 87.6 kg 88.5 kg 84 kg    History of present illness:  60 y.o with significant PMHas below who presented to the ED with chief complaints of rectal bleed, pink-tinged vomitus, abdominal pain and elevated blood sugars.   Patient is not a very good historian, history mostly obtained from patient's chart as there is no family member at the bedside.  Per patient's chart, patient presented with above symptoms with onset in the last 24 hours.  He describes symptoms of bright red loose stools, colicky abdominal pain with associated with generalized weakness and elevated blood sugars in the 200s to 300s.  Denies other symptoms of fevers or chills, shortness of breath, chest pain or dizziness.  Denies alcohol use in the last 15 years.  Hospital Course:  # Ischemic colitis with perforation # Peritonitis # Sepsis # GI bleeding Presented with ischemic colitis complicated by perforation. S/p washout with Hartman's procedure 8/18. ID followed. NG tube discontinued 8/20. Hgb stable, no further bleeding. Diet advanced - oral augmentin through 8/28 - gen surg f/u 1 week, maintain JP drain until then - received ostomy training here, will be sent ostomy supplies, also referred to ostomy clinic in [place] - pt evaluated, only qualifies for outpt pt, referral made, discharged w/ rolling walker  # T2DM Mild DKA on presentation, now resolved - resume home meds but with lower dose of lantus to  start   # HIV positive screening test False positive, rna is neg   # Bipolar disorder stable   # Cirrhosis # Thrombocytopenia Stable. Not followed by GI as outpt. Hx alcoholism, abstinent 15 years now. - needs outpt GI f/u  Procedures: Hartmann's procedure   Consultations: Gen surg, ID  Discharge Exam: Vitals:   04/13/21 0513 04/13/21 0728  BP: 137/74 (!) 142/86  Pulse: 67 69  Resp: 18 18  Temp: 98.4 F (36.9 C) 98.1 F (36.7 C)  SpO2: 98% 100%    General exam: Appears calm and comfortable  Respiratory system: Clear to auscultation. Respiratory effort normal. Cardiovascular system: S1 & S2 heard, RRR. No JVD, murmurs, rubs, gallops or clicks. No pedal edema. Gastrointestinal system: Abdomen is distended. Ostomy with output. Midline surgical incision intact. Mild tenderness throughout Central nervous system: Alert and oriented. No focal neurological deficits. Extremities: Symmetric 5 x 5 power. Skin: No rashes, lesions or ulcers Psychiatry: Judgement and insight appear normal. Mood & affect appropriate.   Discharge Instructions   Discharge Instructions     Amb Referral to Ostomy Clinic   Complete by: As directed    Reason for referral modifiers: Pre and post-operative counseling for ostomy management   DME Ostomy supplies   Complete by: As directed    Diet - low sodium heart healthy   Complete by: As directed    Discharge wound care:   Complete by: As directed    Staples can be left uncovered   Increase activity slowly   Complete by: As directed    Walker rolling   Complete by:  As directed       Allergies as of 04/13/2021       Reactions   Penicillins Nausea Only   Occurred with amoxicillin  Tolerated cephalosporins before   Sulfamethoxazole Nausea Only, Rash        Medication List     TAKE these medications    Accu-Chek FastClix Lancet Kit As directed up to 4 times a day   Accu-Chek FastClix Lancets Misc USE AS DIRECTED UP TO 5 TIMES A  DAY   Accu-Chek Guide test strip Generic drug: glucose blood USE AS INSTRUCTED 5 TIMES DAILY   amoxicillin-clavulanate 875-125 MG tablet Commonly known as: AUGMENTIN Take 1 tablet by mouth every 12 (twelve) hours.   BD Pen Needle Nano 2nd Gen 32G X 4 MM Misc Generic drug: Insulin Pen Needle USE AS DIRECTED TWICE A DAY WITH BYETTA   Blood Glucose Monitoring Suppl Kit 1 each as directed by Does not apply route.   Botox 100 units Solr injection Generic drug: botulinum toxin Type A Inject 1 each into the skin as needed.   carvedilol 6.25 MG tablet Commonly known as: COREG TAKE 1 TABLET BY MOUTH TWICE A DAY WITH MEALS   Dexcom G6 Sensor Misc USE 1 SENSOR EVERY 10 DAYS   Dexcom G6 Transmitter Misc USE AS DIRECTED   Fiasp FlexTouch 100 UNIT/ML FlexTouch Pen Generic drug: insulin aspart Inject 16-20 Units into the skin 2 (two) times daily before a meal. And 12-14 units into the skin daily for snacks What changed:  how much to take additional instructions   Gvoke HypoPen 2-Pack 0.5 MG/0.1ML Soaj Generic drug: Glucagon Inject 0.5 mg into the skin as needed.   Jardiance 25 MG Tabs tablet Generic drug: empagliflozin TAKE 1 TABLET BY MOUTH EVERY DAY   lamoTRIgine 25 MG tablet Commonly known as: LAMICTAL TAKE 4 TABLETS (100 MG TOTAL) BY MOUTH DAILY.   Lantus SoloStar 100 UNIT/ML Solostar Pen Generic drug: insulin glargine INJECT 25 UNITS INTO THE SKIN DAILY. What changed: additional instructions   lisdexamfetamine 70 MG capsule Commonly known as: VYVANSE Take 1 capsule (70 mg total) by mouth daily. Per Dr. Tammi Klippel.  Scripts written for 2/21 and 3/21.  Due for refills 4/21.   lisinopril 20 MG tablet Commonly known as: ZESTRIL Take 0.5 tablets (10 mg total) by mouth daily.   lithium carbonate 450 MG CR tablet Commonly known as: ESKALITH TAKE 1 TABLET BY MOUTH EVERY DAY WITH FOOD   metFORMIN 500 MG tablet Commonly known as: GLUCOPHAGE TAKE 1 TABLET (500 MG TOTAL)  BY MOUTH 2 (TWO) TIMES DAILY WITH A MEAL.   methocarbamol 500 MG tablet Commonly known as: ROBAXIN TAKE 1 TABLET BY MOUTH EVERY 8 HOURS AS NEEDED FOR MUSCLE SPASMS   methylphenidate 10 MG tablet Commonly known as: RITALIN Take 1 tablet (10 mg total) by mouth daily.   multivitamin capsule Take 1 capsule by mouth daily.   nortriptyline 50 MG capsule Commonly known as: PAMELOR Take 50 mg by mouth at bedtime.   omeprazole 20 MG capsule Commonly known as: PRILOSEC TAKE 1 CAPSULE BY MOUTH EVERY DAY   pregabalin 100 MG capsule Commonly known as: LYRICA TAKE 1 CAPSULE BY MOUTH THREE TIMES A DAY   Restasis 0.05 % ophthalmic emulsion Generic drug: cycloSPORINE Place 1 drop into both eyes 2 (two) times daily.   simvastatin 20 MG tablet Commonly known as: ZOCOR TAKE 1 TABLET BY MOUTH EVERY DAY   tadalafil 20 MG tablet Commonly known as: CIALIS Take 5  mg by mouth as needed.   zolpidem 10 MG tablet Commonly known as: AMBIEN Take 1 tablet (10 mg total) by mouth daily.               Durable Medical Equipment  (From admission, onward)           Start     Ordered   04/12/21 0000  DME Ostomy supplies        04/12/21 1500              Discharge Care Instructions  (From admission, onward)           Start     Ordered   04/13/21 0000  Discharge wound care:       Comments: Jodell Cipro can be left uncovered   04/13/21 1137           Allergies  Allergen Reactions   Penicillins Nausea Only    Occurred with amoxicillin  Tolerated cephalosporins before   Sulfamethoxazole Nausea Only and Rash    Follow-up Information     Sakai, Isami, DO Follow up.   Specialty: Surgery Why: follow-up in 1 week, call to schedule appointment Contact information: [place] [address] [place] Follow up.   Specialty: General Surgery Why: schedule an appointment as needed Contact information: [address] [phone] [place]        Lattie Haw, MD Follow up.   Specialty: Family Medicine Contact information: [address] [place] [address] [phone]                  The results of significant diagnostics from this hospitalization (including imaging, microbiology, ancillary and laboratory) are listed below for reference.    Significant Diagnostic Studies: CT ABDOMEN PELVIS WO CONTRAST  Result Date: 04/08/2021 CLINICAL DATA:  Vomiting pink blood. Bright red stools with hypotension. EXAM: CT ABDOMEN AND PELVIS WITHOUT CONTRAST TECHNIQUE: Multidetector CT imaging of the abdomen and pelvis was performed following the standard protocol without IV contrast. COMPARISON:  None. FINDINGS: Lower chest: No acute finding. Multifocal coronary atherosclerotic calcification. Gynecomastia Hepatobiliary: Cirrhotic liver morphology with lobulation, hypertrophied umbilical vein, and fissure/caudate lobe enlargement. No superimposed acute finding.No evidence of biliary obstruction or stone. Pancreas: Unremarkable. Spleen: Enlarged with dystrophic calcification along an area of lateral capsular based thinning. Cystic density which is incidental. Adrenals/Urinary Tract: Negative adrenals. No hydronephrosis or stone. Unremarkable bladder. Stomach/Bowel: Segment of thick walled distal descending to sigmoid colon with mesenteric stranding. There are a few loops of distended small bowel without definite obstructive pattern. Vascular/Lymphatic: Atheromatous calcifications of the aorta and visceral branches. Umbilical venous recanalization/distension. No mass or adenopathy. Reproductive:No pathologic findings. Other: Trace ascitic fluid seen in the right lower quadrant. Musculoskeletal: Fatty infiltration of the upper left rectus femoris is likely from atrophy. IMPRESSION: 1. Distal descending to segmental colitis, possible ischemic colitis given  the history. 2. Cirrhosis with portal hypertension. 3. Atherosclerosis which includes the coronary arteries. Electronically Signed   By: Monte Fantasia M.D.   On: 04/08/2021 04:25   DG Chest Port 1 View  Result Date: 04/08/2021 CLINICAL DATA:  Questionable sepsis. EXAM: PORTABLE CHEST 1 VIEW COMPARISON:  Chest radiograph dated 05/11/2006. FINDINGS: No focal consolidation, pleural effusion, or pneumothorax. The cardiac silhouette is within normal limits. No acute osseous pathology. IMPRESSION: No active disease. Electronically Signed   By: Anner Crete M.D.   On:  04/08/2021 03:50    Microbiology: Recent Results (from the past 240 hour(s))  Resp Panel by RT-PCR (Flu A&B, Covid) Nasopharyngeal Swab     Status: None   Collection Time: 04/08/21  2:22 AM   Specimen: Nasopharyngeal Swab; Nasopharyngeal(NP) swabs in vial transport medium  Result Value Ref Range Status   SARS Coronavirus 2 by RT PCR NEGATIVE NEGATIVE Final    Comment: (NOTE) SARS-CoV-2 target nucleic acids are NOT DETECTED.  The SARS-CoV-2 RNA is generally detectable in upper respiratory specimens during the acute phase of infection. The lowest concentration of SARS-CoV-2 viral copies this assay can detect is 138 copies/mL. A negative result does not preclude SARS-Cov-2 infection and should not be used as the sole basis for treatment or other patient management decisions. A negative result may occur with  improper specimen collection/handling, submission of specimen other than nasopharyngeal swab, presence of viral mutation(s) within the areas targeted by this assay, and inadequate number of viral copies(<138 copies/mL). A negative result must be combined with clinical observations, patient history, and epidemiological information. The expected result is Negative.  Fact Sheet for Patients:  EntrepreneurPulse.com.au  Fact Sheet for Healthcare Providers:  IncredibleEmployment.be  This  test is no t yet approved or cleared by the [place] FDA and  has been authorized for detection and/or diagnosis of SARS-CoV-2 by FDA under an Emergency Use Authorization (EUA). This EUA will remain  in effect (meaning this test can be used) for the duration of the COVID-19 declaration under Section 564(b)(1) of the Act, 21 U.S.C.section 360bbb-3(b)(1), unless the authorization is terminated  or revoked sooner.       Influenza A by PCR NEGATIVE NEGATIVE Final   Influenza B by PCR NEGATIVE NEGATIVE Final    Comment: (NOTE) The Xpert Xpress SARS-CoV-2/FLU/RSV plus assay is intended as an aid in the diagnosis of influenza from Nasopharyngeal swab specimens and should not be used as a sole basis for treatment. Nasal washings and aspirates are unacceptable for Xpert Xpress SARS-CoV-2/FLU/RSV testing.  Fact Sheet for Patients: EntrepreneurPulse.com.au  Fact Sheet for Healthcare Providers: IncredibleEmployment.be  This test is not yet approved or cleared by the [place] FDA and has been authorized for detection and/or diagnosis of SARS-CoV-2 by FDA under an Emergency Use Authorization (EUA). This EUA will remain in effect (meaning this test can be used) for the duration of the COVID-19 declaration under Section 564(b)(1) of the Act, 21 U.S.C. section 360bbb-3(b)(1), unless the authorization is terminated or revoked.  Performed at [facility], [address] [place], [place] [address]   Culture, blood (Routine X 2) w Reflex to ID Panel     Status: None   Collection Time: 04/08/21  3:43 AM   Specimen: BLOOD  Result Value Ref Range Status   Specimen Description BLOOD LEFT [facility]  Final   Special Requests   Final    BOTTLES DRAWN AEROBIC AND ANAEROBIC Blood Culture adequate volume   Culture   Final    NO GROWTH 5 DAYS Performed at [facility], [address], [place], [place] [address]    Report Status 04/13/2021  FINAL  Final  Culture, blood (Routine X 2) w Reflex to ID Panel     Status: None   Collection Time: 04/08/21  3:43 AM   Specimen: BLOOD  Result Value Ref Range Status   Specimen Description BLOOD RIGHT FA  Final   Special Requests   Final    BOTTLES DRAWN AEROBIC AND ANAEROBIC Blood Culture adequate volume  Culture   Final    NO GROWTH 5 DAYS Performed at [facility], [address] [place], [place] [address]    Report Status 04/13/2021 FINAL  Final  Urine Culture     Status: Abnormal   Collection Time: 04/08/21  3:43 AM   Specimen: Urine, Clean Catch  Result Value Ref Range Status   Specimen Description   Final    URINE, CLEAN CATCH Performed at [facility], [address], [place], [place] [address]    Special Requests   Final    NONE Performed at [facility], [address], [place], [place] [address]    Culture (A)  Final    <10,000 COLONIES/mL INSIGNIFICANT GROWTH Performed at [facility], [place] [address] [place], [place] [address]    Report Status 04/09/2021 FINAL  Final  MRSA Next Gen by PCR, Nasal     Status: None   Collection Time: 04/08/21  6:34 AM   Specimen: Nasal Mucosa; Nasal Swab  Result Value Ref Range Status   MRSA by PCR Next Gen NOT DETECTED NOT DETECTED Final    Comment: (NOTE) The GeneXpert MRSA Assay (FDA approved for NASAL specimens only), is one component of a comprehensive MRSA colonization surveillance program. It is not intended to diagnose MRSA infection nor to guide or monitor treatment for MRSA infections. Test performance is not FDA approved in patients less than 56 years old. Performed at [facility], [address] [place], [place] [address]   Fungus Culture With Stain     Status: None (Preliminary result)   Collection Time: 04/08/21 10:02 AM   Specimen: Other Source; Body Fluid  Result Value Ref Range Status   Fungus Stain Final report  Final    Comment: (NOTE) Performed At: [facility] [place], [place] [id number] Rush Farmer MD EB:[id number]    Fungus (Mycology) Culture PENDING  Incomplete   Fungal Source PERITONEAL  Final    Comment: Performed at [facility], [address] [place], [place] [address]  Aerobic/Anaerobic Culture w Gram Stain (surgical/deep wound)     Status: None (Preliminary result)   Collection Time: 04/08/21 10:02 AM   Specimen: Other Source; Body Fluid  Result Value Ref Range Status   Specimen Description   Final    PERITONEAL Performed at [facility], [address] [place], [place] [address]    Special Requests SWAB  Final   Gram Stain   Final    FEW WBC PRESENT,BOTH PMN AND MONONUCLEAR NO ORGANISMS SEEN    Culture   Final    NO GROWTH 4 DAYS NO ANAEROBES ISOLATED; CULTURE IN PROGRESS FOR 5 DAYS Performed at [facility], [place] [address] [place], [place] [address]    Report Status PENDING  Incomplete  Fungus Culture Result     Status: None   Collection Time: 04/08/21 10:02 AM  Result Value Ref Range Status   Result 1 Comment  Final    Comment: (NOTE) KOH/Calcofluor preparation:  no fungus observed. Performed At: [facility] [place], [place] [id number] Rush Farmer MD NO:[id number]      Labs: Basic Metabolic Panel: Recent Labs  Lab 04/08/21 1706 04/09/21 0410 04/10/21 0617 04/11/21 0417 04/12/21 0431 04/13/21 0304  NA 136 136 136 136 134* 134*  K 3.7 3.9 3.7 3.6 3.7 3.9  CL 112* 111 106 107 106 106  CO2 18* 19* 21* 21* 22 21*  GLUCOSE 175*  127* 117* 102* 140* 223*  BUN 38* 25* '19 16 13 15  ' CREATININE 1.58* 1.13 0.96 0.77 0.73 0.74  CALCIUM 7.5* 7.5* 7.7* 8.1* 8.1* 8.4*  MG 2.2 2.1  --  1.7 2.0 1.9  PHOS 3.1 1.7* 1.8* 2.2* 2.2* 3.0   Liver Function Tests: Recent Labs  Lab 04/08/21 0219 04/08/21 1438 04/10/21 0617 04/11/21 0417 04/12/21 0431  AST 32 '19 25 24 22  ' ALT 34 '24 24 25 24  ' ALKPHOS 41 34* 40 47 51  BILITOT 2.4*  1.8* 2.0* 1.4* 1.0  PROT 6.3* 5.3* 5.5* 6.1* 6.0*  ALBUMIN 3.5 2.8* 2.8* 2.9* 2.9*   Recent Labs  Lab 04/08/21 0219  LIPASE 27  AMYLASE 62   Recent Labs  Lab 04/08/21 0231  AMMONIA 24   CBC: Recent Labs  Lab 04/08/21 0219 04/08/21 1438 04/09/21 0410 04/10/21 0617 04/11/21 0417 04/12/21 0431  WBC 11.7* 9.0 4.1 4.0 3.7* 3.6*  NEUTROABS 9.7*  --   --   --   --   --   HGB 14.5 12.8* 12.1* 11.8* 13.2 13.2  HCT 43.1 38.2* 35.2* 35.5* 39.6 39.3  MCV 83.5 82.3 84.4 84.1 84.1 82.2  PLT 121* 87* 61* 71* 87* 106*   Cardiac Enzymes: No results for input(s): CKTOTAL, CKMB, CKMBINDEX, TROPONINI in the last 168 hours. BNP: BNP (last 3 results) No results for input(s): BNP in the last 8760 hours.  ProBNP (last 3 results) No results for input(s): PROBNP in the last 8760 hours.  CBG: Recent Labs  Lab 04/12/21 0814 04/12/21 1144 04/12/21 1637 04/12/21 2019 04/13/21 0728  GLUCAP 138* 144* 210* 203* 180*       Signed:  Desma Maxim MD.  [facility] 04/13/2021, 11:42 AM

## 2021-04-14 ENCOUNTER — Other Ambulatory Visit: Payer: Self-pay | Admitting: Family Medicine

## 2021-04-14 ENCOUNTER — Telehealth: Payer: Self-pay

## 2021-04-14 ENCOUNTER — Telehealth: Payer: Self-pay | Admitting: Pharmacist

## 2021-04-14 DIAGNOSIS — Z794 Long term (current) use of insulin: Secondary | ICD-10-CM

## 2021-04-14 DIAGNOSIS — E1140 Type 2 diabetes mellitus with diabetic neuropathy, unspecified: Secondary | ICD-10-CM

## 2021-04-14 NOTE — Telephone Encounter (Signed)
Patient called within 30 minutes of arrival home after his hospitalization for a bowel perforation. He was hospitalized at [facility] last Thursday evening and had surgery including placement of a colostomy.  He spent ~ 2 days in the ICU and ~ 2 days in step down.   He called to discuss his insulin regimen following this procedure.    We discussed and agreed to use a reduced long acting Lantus (insulin glargine) dose.  Reducing to 10 units once daily from 25 units.  We also agreed to slightly reduce but continue the same intervals of sliding scale at this time.  Patient aware to use less if his carbohydrate intake is limited.    I plan to follow-up 8/25 to discuss control and additional adjustments.

## 2021-04-14 NOTE — Telephone Encounter (Signed)
Transition Care Management Unsuccessful Follow-up Telephone Call  Date of discharge and from where:  04/13/2021-[facility]  Attempts:  1st Attempt  Reason for unsuccessful TCM follow-up call:  Voice mail full

## 2021-04-14 NOTE — Telephone Encounter (Signed)
I agree with Dr Koval's assessment and plan. 

## 2021-04-14 NOTE — Telephone Encounter (Signed)
Noted, thank you Dr Valentina Lucks.

## 2021-04-15 NOTE — Telephone Encounter (Signed)
Transition Care Management Unsuccessful Follow-up Telephone Call  Date of discharge and from where:  04/13/2021-[facility]  Attempts:  2nd Attempt  Reason for unsuccessful TCM follow-up call:  Voice mail full

## 2021-04-16 ENCOUNTER — Telehealth: Payer: Self-pay | Admitting: Pharmacist

## 2021-04-16 NOTE — Telephone Encounter (Signed)
Patient contacted for follow/up of recent hospitalization for bowel perforation and insulin dose management.   Since last contact patient reports blood sugars have been fairly well controlled in the 200s.  He restarted his CGM today once he got home and reported a current blood glucose of 282.    Medications currently being used; V  Fiasp (insulin aspart)  6-8 units with meals Lantus (insulni glargine) 10 units once daily.   We discussed increasing both doses gradually as his meal intake continues to increase.  Anticipate needs of both insulin types will be > 50% of previous (pre-surgery doses) in the next week.   We agreed that short-term goals were to avoid readings > 300 and to avoid hypoglycemia (< 80)  as his body is recovering.     Total time with patient call and documentation of interaction: 12 minutes.  F/U Phone call planned: 5 days (likely close phone follow-up for the next two-three weeks will be required to help control blood sugars as needs will fluctuate and increase as he returns to normal function.

## 2021-04-16 NOTE — Telephone Encounter (Signed)
I have reviewed Dr Graylin Shiver note and agree with his assessment and plan.

## 2021-04-18 ENCOUNTER — Other Ambulatory Visit: Payer: Self-pay | Admitting: Obstetrics and Gynecology

## 2021-04-18 NOTE — Telephone Encounter (Signed)
Transition Care Management Unsuccessful Follow-up Telephone Call  Date of discharge and from where:  04/13/2021 from [facility]  Attempts:  3rd Attempt  Reason for unsuccessful TCM follow-up call:  Unable to reach patient

## 2021-04-19 DIAGNOSIS — Z933 Colostomy status: Secondary | ICD-10-CM | POA: Diagnosis not present

## 2021-04-20 ENCOUNTER — Telehealth: Payer: Self-pay | Admitting: Pharmacist

## 2021-04-20 DIAGNOSIS — E1140 Type 2 diabetes mellitus with diabetic neuropathy, unspecified: Secondary | ICD-10-CM

## 2021-04-20 NOTE — Telephone Encounter (Signed)
-----   Message from Leavy Cella, [place] sent at 04/16/2021  1:10 PM EDT ----- Regarding: Glucose / Insulin Adjustment

## 2021-04-20 NOTE — Assessment & Plan Note (Signed)
Contacted patient in follow-up of blood sugar following recent hospitalization for bowel perforation and colostomy placement.    Patient reports feeling much better.  States he feels remarkably well for all that has happened to [employer].  He reports consuming protein shakes to help with wound healing and calorie intake.    CGM has been OFF for two days - checking home readings manually, reports reading in 200s with a few 300 range. Denies any low readings or symptoms of hypoglycemia.   He has increased his insulin doses back to pre-surgery levels.  Lantus (insulin glargine) 24 units daily Fiasp (Insulin aspart) ~16 units with meals.   We agreed to make no adjustments today as he does not have CGM on.  Plans to have CGM back on later today.   Plan is for me to review CGM in 3 days to ensure continue acceptable level of control.   Call in 1 week unless CGM is elevated out of goal range in 3 days.

## 2021-04-20 NOTE — Telephone Encounter (Signed)
Contacted patient in follow-up of blood sugar following recent hospitalization for bowel perforation and colostomy placement.    Patient reports feeling much better.  States he feels remarkably well for all that has happened to [employer].  He reports consuming protein shakes to help with wound healing and calorie intake.    CGM has been OFF for two days - checking home readings manually, reports reading in 200s with a few 300 range. Denies any low readings or symptoms of hypoglycemia.   He has increased his insulin doses back to pre-surgery levels.  Lantus (insulin glargine) 24 units daily Fiasp (Insulin aspart) ~16 units with meals.   We agreed to make no adjustments today as he does not have CGM on.  Plans to have CGM back on later today.   Plan is for me to review CGM in 3 days to ensure continue acceptable level of control.   Call in 1 week unless CGM is elevated out of goal range in 3 days.

## 2021-04-20 NOTE — Telephone Encounter (Signed)
Noted and agreed, thanks Dr Valentina Lucks.

## 2021-04-20 NOTE — Telephone Encounter (Signed)
Noted and agree. 

## 2021-04-22 ENCOUNTER — Other Ambulatory Visit: Payer: Self-pay | Admitting: Family Medicine

## 2021-04-22 DIAGNOSIS — Z794 Long term (current) use of insulin: Secondary | ICD-10-CM

## 2021-04-28 ENCOUNTER — Telehealth: Payer: Self-pay | Admitting: Pharmacist

## 2021-04-28 NOTE — Telephone Encounter (Signed)
Patient contacted for follow/up of CGM readings and insulin control.    No answer, left HIPAA compliant email sharing no need to call back but provided number if patient desired to talk.   CGM report looks like patient has had excellent control over the last 7 days.   (14 day average remain elevated but more recent number are significantly better)  No low readings recorded on CGM.    Total time with patient call and documentation of interaction: 6 minutes.  F/U Phone call planned: 1 week.

## 2021-04-28 NOTE — Telephone Encounter (Signed)
-----   Message from Leavy Cella, [place] sent at 04/23/2021  9:14 AM EDT ----- Regarding: Check CGM and call if elevated

## 2021-05-06 ENCOUNTER — Telehealth: Payer: Self-pay | Admitting: Pharmacist

## 2021-05-06 NOTE — Telephone Encounter (Signed)
Noted and agree. 

## 2021-05-06 NOTE — Telephone Encounter (Signed)
-----   Message from Leavy Cella, [place] sent at 04/28/2021  2:06 PM EDT ----- Regarding: CGM and Insulin dosing follow-up

## 2021-05-06 NOTE — Telephone Encounter (Signed)
Patient contacted for follow-up of recent hospitalization for GI perforation and S/P colostomy  Since last contact patient reports increased appetite and significant weight regain following loss of approximately 30 lbs following procedure/hospitalization.   CGM -Dexcom report reviewed and discussed with patient.  Overall, control appears to be good and no changes to diabetes regimen were made.  <0.4% low readings / 42% 70-180  /  only 14% > '250mg'$ /dl  Patient denies any significant hypoglycemic events.     Motivation to recover from surgery.  Colostomy repair is anticipated ~ 4 months after initial event (December).    Total time with patient call and documentation of interaction: 14 minutes.  F/U Phone call planned: No planned phone follow-up.  Encouraged patient to call / schedule PCP follow-up as needed.  Patient verbalized appreciation for the call.

## 2021-05-07 LAB — FUNGAL ORGANISM REFLEX

## 2021-05-07 LAB — FUNGUS CULTURE WITH STAIN

## 2021-05-07 LAB — FUNGUS CULTURE RESULT

## 2021-05-07 NOTE — Telephone Encounter (Signed)
Thank you Dr Valentina Lucks. I will follow up with Mr Laible on 27th Sept.

## 2021-05-18 ENCOUNTER — Ambulatory Visit: Payer: Medicaid Other | Admitting: Family Medicine

## 2021-05-19 DIAGNOSIS — Z933 Colostomy status: Secondary | ICD-10-CM | POA: Diagnosis not present

## 2021-06-07 ENCOUNTER — Ambulatory Visit: Payer: Medicaid Other | Admitting: Student

## 2021-06-21 DIAGNOSIS — Z933 Colostomy status: Secondary | ICD-10-CM | POA: Diagnosis not present

## 2021-06-22 ENCOUNTER — Ambulatory Visit: Payer: Medicaid Other | Admitting: Family Medicine

## 2021-06-22 ENCOUNTER — Encounter: Payer: Self-pay | Admitting: Family Medicine

## 2021-06-22 ENCOUNTER — Other Ambulatory Visit: Payer: Self-pay

## 2021-06-22 ENCOUNTER — Ambulatory Visit (HOSPITAL_COMMUNITY)
Admission: RE | Admit: 2021-06-22 | Discharge: 2021-06-22 | Disposition: A | Discharge status: Home / Self Care | Payer: Medicaid Other | Source: Ambulatory Visit | Attending: Nurse Practitioner | Admitting: Nurse Practitioner

## 2021-06-22 VITALS — BP 128/60 | HR 90 | Ht 67.0 in | Wt 180.6 lb

## 2021-06-22 DIAGNOSIS — Z933 Colostomy status: Secondary | ICD-10-CM | POA: Insufficient documentation

## 2021-06-22 DIAGNOSIS — K9400 Colostomy complication, unspecified: Secondary | ICD-10-CM | POA: Diagnosis not present

## 2021-06-22 DIAGNOSIS — E1140 Type 2 diabetes mellitus with diabetic neuropathy, unspecified: Secondary | ICD-10-CM | POA: Diagnosis not present

## 2021-06-22 DIAGNOSIS — Z794 Long term (current) use of insulin: Secondary | ICD-10-CM | POA: Diagnosis not present

## 2021-06-22 DIAGNOSIS — K432 Incisional hernia without obstruction or gangrene: Secondary | ICD-10-CM

## 2021-06-22 DIAGNOSIS — M1731 Unilateral post-traumatic osteoarthritis, right knee: Secondary | ICD-10-CM

## 2021-06-22 LAB — POCT GLYCOSYLATED HEMOGLOBIN (HGB A1C): HbA1c, POC (controlled diabetic range): 7.5 % — AB (ref 0.0–7.0)

## 2021-06-22 NOTE — Patient Instructions (Signed)
Thank you for coming to see me today. It was a pleasure.   Continue current medication  Please follow-up with PCP in 1 month  If you have any questions or concerns, please do not hesitate to call the office at [phone].  Best,   Carollee Leitz, MD

## 2021-06-22 NOTE — Progress Notes (Signed)
    SUBJECTIVE:   CHIEF COMPLAINT / HPI: physical  No acute concerns Has not been seen by PCP in a while.  Followed by Dr Valentina Lucks for diabetes management. Has had some low glucose 40-50's.  Currently asymptomatic.  Reports while in hospital, was recommended  to follow up with liver specialist for history of cirrhosis.  Would like to know about surgery on left knee,was scheduled before COVID but had been postponed.  Then had recent surgery on bowels.    PERTINENT  PMH / PSH:  DM Type 2 HTN  OBJECTIVE:   BP 128/60   Pulse 90   Ht 5\' 7"  (1.702 m)   Wt 180 lb 9.6 oz (81.9 kg)   SpO2 99%   BMI 28.29 kg/m    General: Alert, no acute distress Cardio: Normal S1 and S2, RRR, no r/m/g Pulm: CTAB, normal work of breathing Abdomen: Bowel sounds normal. Abdomen soft and non-tender. Right colostomy  ASSESSMENT/PLAN:   Diabetes mellitus with neuropathy (HCC) A1c 7.5 today.  Has had some hypoglycemic events to 40's-50's Recommend to follow up with Dr Valentina Lucks who is managing DM Strict  Return precautions provided   Cirrhosis ([place]) Defer to PCP for management  Post-traumatic osteoarthritis of right knee Recommend follow up with PCP for referral to Ortho once cleared from GI s/p recent colostomy     Carollee Leitz, MD [place]

## 2021-06-22 NOTE — Progress Notes (Signed)
[facility]   Reason for visit:  LUQ colostomy HPI:  Ischemic colitis ROS  Review of Systems  Gastrointestinal:        Parastomal hernia  Skin:  Positive for rash.       Irritation to peristomal skin.   All other systems reviewed and are negative. Vital signs:  BP (!) 160/87 (BP Location: Right Arm)   Pulse 93   Temp 97.6 F (36.4 C) (Oral)   Resp 18   Ht 5\' 7"  (1.702 m)   Wt 77.1 kg   SpO2 100%   BMI 26.63 kg/m  Exam:  Physical Exam Constitutional:      Appearance: He is normal weight.  Musculoskeletal:     Comments: Walks with cane  Neurological:     Mental Status: He is alert.  Psychiatric:        Mood and Affect: Mood normal.    Stoma type/location:  LMQ colostomy with parastomal hernia that has worsened in the last 2 days.  Stomal assessment/size:  1 3/8" pink moist stoma with surrounding hernia.  He has experienced some leaks and the hernia will likely worsen this.  I am switching him to Mio Flip.  Made for hernias and rounded abdomen.  Barrier ring and belt.  Peristomal assessment:  Parastomal hernia (photo in chart)  Treatment options for stomal/peristomal skin: barrier ring and convex Mio flip pouch Output: soft brown stool Ostomy pouching: 1pc.convex with barrier ring and belt.  Education provided:  Enrolled with coloplast pouches.  Will need to be set up with [employer] for ongoing supplies. WIll wait to hear how coloplast pouch performed.     Impression/dx  Parastomal hernia Discussion  Need for convex pouch and belt.  Plan  Added new pouching system. Call office and let me know if new pouch is working If so, will set up with supply company.       Visit time: 45 minutes.   Domenic Moras FNP-BC

## 2021-06-24 ENCOUNTER — Other Ambulatory Visit: Payer: Self-pay | Admitting: Family Medicine

## 2021-06-24 NOTE — Discharge Instructions (Signed)
You are enrolled with coloplast for samples of the newpouch.   Let me know how this works and if you want to switch over to this.

## 2021-06-25 ENCOUNTER — Other Ambulatory Visit: Payer: Self-pay

## 2021-06-25 ENCOUNTER — Encounter: Payer: Self-pay | Admitting: Family Medicine

## 2021-06-25 NOTE — Assessment & Plan Note (Signed)
A1c 7.5 today.  Has had some hypoglycemic events to 40's-50's Recommend to follow up with Dr Valentina Lucks who is managing DM Strict  Return precautions provided

## 2021-06-25 NOTE — Patient Instructions (Addendum)
06/25/2021 Name: Patrick Elliott MRN: [id number] DOB: [birth year]  Referred by: Lattie Haw, MD Reason for referral : High Risk Managed Medicaid (Unsuccessful Telephone Outreach)   An unsuccessful telephone outreach was attempted today. The patient was referred to the case management team for assistance with care management and care coordination.    Follow Up Plan: A HIPAA compliant phone message was left for the patient providing contact information and requesting a return call.  The Managed Medicaid care management team will reach out to the patient again over the next 14 days.   Salvatore Marvel RN, BSN Community Care Coordinator [facility] Mobile: [phone]

## 2021-06-25 NOTE — Assessment & Plan Note (Signed)
Defer to PCP for management.

## 2021-06-25 NOTE — Assessment & Plan Note (Signed)
Recommend follow up with PCP for referral to Ortho once cleared from GI s/p recent colostomy

## 2021-06-25 NOTE — Patient Outreach (Addendum)
  06/25/2021 Name: Patrick Elliott MRN: [id number] DOB: 1960-09-05  Referred by: Lattie Haw, MD Reason for referral : High Risk Managed Medicaid (Unsuccessful Telephone Outreach)   An unsuccessful telephone outreach was attempted today. The patient was referred to the case management team for assistance with care management and care coordination.  A HIPPA approved voicemail message was left requesting that the patient return the telephone call.  Follow Up Plan: The Managed Medicaid care management team will reach out to the patient again over the next 14 days.  Salvatore Marvel RN, BSN Community Care Coordinator [facility] Mobile: [phone]

## 2021-07-06 ENCOUNTER — Telehealth: Payer: Self-pay | Admitting: Family Medicine

## 2021-07-06 NOTE — Telephone Encounter (Signed)
..   Medicaid Managed Care   Unsuccessful Outreach Note  07/06/2021 Name: Patrick Elliott MRN: [id number] DOB: 1960-11-21  Referred by: Lattie Haw, MD Reason for referral : High Risk Managed Medicaid (I called the patient today to get his phone visit with the MM RNCM rescheduled. I left my name and number on his VM.)   An unsuccessful telephone outreach was attempted today. The patient was referred to the case management team for assistance with care management and care coordination.   Follow Up Plan: The care management team will reach out to the patient again over the next 7 days.   [place]

## 2021-07-07 DIAGNOSIS — M542 Cervicalgia: Secondary | ICD-10-CM | POA: Diagnosis not present

## 2021-07-07 DIAGNOSIS — G518 Other disorders of facial nerve: Secondary | ICD-10-CM | POA: Diagnosis not present

## 2021-07-07 DIAGNOSIS — G43719 Chronic migraine without aura, intractable, without status migrainosus: Secondary | ICD-10-CM | POA: Diagnosis not present

## 2021-07-07 DIAGNOSIS — M791 Myalgia, unspecified site: Secondary | ICD-10-CM | POA: Diagnosis not present

## 2021-07-08 ENCOUNTER — Other Ambulatory Visit: Payer: Self-pay | Admitting: Family Medicine

## 2021-07-08 DIAGNOSIS — Z794 Long term (current) use of insulin: Secondary | ICD-10-CM

## 2021-07-13 ENCOUNTER — Other Ambulatory Visit: Payer: Self-pay | Admitting: Family Medicine

## 2021-07-24 ENCOUNTER — Other Ambulatory Visit: Payer: Self-pay | Admitting: Family Medicine

## 2021-07-24 DIAGNOSIS — E1140 Type 2 diabetes mellitus with diabetic neuropathy, unspecified: Secondary | ICD-10-CM

## 2021-07-24 DIAGNOSIS — E118 Type 2 diabetes mellitus with unspecified complications: Secondary | ICD-10-CM

## 2021-07-24 DIAGNOSIS — Z794 Long term (current) use of insulin: Secondary | ICD-10-CM

## 2021-07-28 DIAGNOSIS — Z933 Colostomy status: Secondary | ICD-10-CM | POA: Diagnosis not present

## 2021-08-09 ENCOUNTER — Other Ambulatory Visit: Payer: Self-pay | Admitting: Family Medicine

## 2021-08-09 DIAGNOSIS — E785 Hyperlipidemia, unspecified: Secondary | ICD-10-CM

## 2021-08-09 DIAGNOSIS — F900 Attention-deficit hyperactivity disorder, predominantly inattentive type: Secondary | ICD-10-CM | POA: Diagnosis not present

## 2021-08-09 DIAGNOSIS — F3181 Bipolar II disorder: Secondary | ICD-10-CM | POA: Diagnosis not present

## 2021-08-09 DIAGNOSIS — F411 Generalized anxiety disorder: Secondary | ICD-10-CM | POA: Diagnosis not present

## 2021-08-18 DIAGNOSIS — G43719 Chronic migraine without aura, intractable, without status migrainosus: Secondary | ICD-10-CM | POA: Diagnosis not present

## 2021-08-18 DIAGNOSIS — G518 Other disorders of facial nerve: Secondary | ICD-10-CM | POA: Diagnosis not present

## 2021-08-18 DIAGNOSIS — M542 Cervicalgia: Secondary | ICD-10-CM | POA: Diagnosis not present

## 2021-08-18 DIAGNOSIS — M791 Myalgia, unspecified site: Secondary | ICD-10-CM | POA: Diagnosis not present

## 2021-08-28 ENCOUNTER — Other Ambulatory Visit: Payer: Self-pay | Admitting: Family Medicine

## 2021-08-28 DIAGNOSIS — E118 Type 2 diabetes mellitus with unspecified complications: Secondary | ICD-10-CM

## 2021-08-30 ENCOUNTER — Telehealth: Payer: Self-pay | Admitting: *Deleted

## 2021-08-30 DIAGNOSIS — Z933 Colostomy status: Secondary | ICD-10-CM | POA: Diagnosis not present

## 2021-08-30 NOTE — Telephone Encounter (Signed)
Pt calling in wanting dr Valentina Lucks to give him a call. Please advise. Patrick Elliott Kennon Holter, CMA

## 2021-08-30 NOTE — Telephone Encounter (Signed)
Noted and agree. 

## 2021-08-30 NOTE — Telephone Encounter (Signed)
Phone call to patient at his request for call back.   Patient reports he is recovering from a recent diagnosis of COVID.  He states his blood sugar control has been fairly good.  He did not have any specific request today.  He did not have any need for refills of his current medications.   - Patient recently reestablished mental health management with Dr. Tammi Klippel - who he previously had a long-term relationship several years ago.  He was happy to reestablish care with Dr. Tammi Klippel.   - Patient has colostomy in place and plans to follow-up about his reanastomoses procedure which could happen in the next few months.    His call today was primarily to contact me and ensure he had a direct line contact for PRN use.  He lost my contact information when he damaged his phone recently and needed to replace his phone.  No change in his diabetes or CGM management plans at this time.  I look forward to following up with him in the near future.  I asked his to reschedule a PCP appointment with Dr. Posey Pronto.

## 2021-09-03 DIAGNOSIS — Z794 Long term (current) use of insulin: Secondary | ICD-10-CM | POA: Diagnosis not present

## 2021-09-03 DIAGNOSIS — E7800 Pure hypercholesterolemia, unspecified: Secondary | ICD-10-CM | POA: Diagnosis not present

## 2021-09-03 DIAGNOSIS — E1140 Type 2 diabetes mellitus with diabetic neuropathy, unspecified: Secondary | ICD-10-CM | POA: Diagnosis not present

## 2021-09-19 ENCOUNTER — Other Ambulatory Visit: Payer: Self-pay | Admitting: Family Medicine

## 2021-09-19 DIAGNOSIS — E1140 Type 2 diabetes mellitus with diabetic neuropathy, unspecified: Secondary | ICD-10-CM

## 2021-09-30 DIAGNOSIS — Z933 Colostomy status: Secondary | ICD-10-CM | POA: Diagnosis not present

## 2021-10-07 ENCOUNTER — Other Ambulatory Visit: Payer: Self-pay | Admitting: Family Medicine

## 2021-10-07 DIAGNOSIS — J019 Acute sinusitis, unspecified: Secondary | ICD-10-CM | POA: Diagnosis not present

## 2021-10-12 ENCOUNTER — Other Ambulatory Visit: Payer: Self-pay | Admitting: Family Medicine

## 2021-10-15 ENCOUNTER — Telehealth: Payer: Self-pay | Admitting: Pharmacist

## 2021-10-15 NOTE — Telephone Encounter (Signed)
Patient called my office line directly and shared several updates.   He reports we was sick with Covid in January and following that illness he developed a second respiratory infection.  He states he is currently recovering from this most recent illness.    He reports that his colostomy re-anastomosis procedure has not yet been scheduled.   He reports that he is awaiting a knee procedure that has been delayed due to his respiratory illness.  He is concerned that his sugars are too high and his A1c needs to be < 8 for the surgeon to agree to surgery.   His most recent A1c was 8.1  He shared recent blood glucose readings/trends from his CGM device and he feels like he is running high due to stress/recent infection.  We agreed to make minimal change to his current doses but we also agreed to allow a 2-4 unit sliding scale for Fiasp to help with elevated readings.   I asked him to reschedule with me in the upcoming weeks.  He agreed and has made a follow-up appointment in 1 week.

## 2021-10-15 NOTE — Telephone Encounter (Signed)
Noted and agree. 

## 2021-10-19 ENCOUNTER — Telehealth: Payer: Self-pay | Admitting: Family Medicine

## 2021-10-19 NOTE — Telephone Encounter (Signed)
.. °  Medicaid Managed Care   Unsuccessful Outreach Note  10/19/2021 Name: [name] MRN: [id number] DOB: 01-30-1961  Referred by: Lattie Haw, MD Reason for referral : High Risk Managed Medicaid (I called the patient today to get him rescheduled with the MM RNCM. I left my name and number on his VM.)   A second unsuccessful telephone outreach was attempted today. The patient was referred to the case management team for assistance with care management and care coordination.   Follow Up Plan: The care management team will reach out to the patient again over the next 14 days.    [place]

## 2021-10-20 DIAGNOSIS — M791 Myalgia, unspecified site: Secondary | ICD-10-CM | POA: Diagnosis not present

## 2021-10-20 DIAGNOSIS — M542 Cervicalgia: Secondary | ICD-10-CM | POA: Diagnosis not present

## 2021-10-20 DIAGNOSIS — G518 Other disorders of facial nerve: Secondary | ICD-10-CM | POA: Diagnosis not present

## 2021-10-20 DIAGNOSIS — G43719 Chronic migraine without aura, intractable, without status migrainosus: Secondary | ICD-10-CM | POA: Diagnosis not present

## 2021-10-21 ENCOUNTER — Other Ambulatory Visit: Payer: Self-pay

## 2021-10-21 ENCOUNTER — Ambulatory Visit: Payer: Medicaid Other | Admitting: Pharmacist

## 2021-10-21 ENCOUNTER — Encounter: Payer: Self-pay | Admitting: Pharmacist

## 2021-10-21 DIAGNOSIS — E1140 Type 2 diabetes mellitus with diabetic neuropathy, unspecified: Secondary | ICD-10-CM | POA: Diagnosis not present

## 2021-10-21 DIAGNOSIS — Z794 Long term (current) use of insulin: Secondary | ICD-10-CM | POA: Diagnosis not present

## 2021-10-21 MED ORDER — FIASP FLEXTOUCH 100 UNIT/ML ~~LOC~~ SOPN: 20.0000 [IU] | PEN_INJECTOR | Freq: Two times a day (BID) | SUBCUTANEOUS | 6 refills | Status: DC

## 2021-10-21 NOTE — Progress Notes (Signed)
Reviewed: I agree with Dr. Koval's documentation and management. 

## 2021-10-21 NOTE — Patient Instructions (Addendum)
Great to see you today.  ? ?Continue Lantus 40 units once daily.  ? ?Increase Fiasp as follows: ?If blood sugars is  ?Less than 200 and you have less than 15 grams of Carb:  20 units ?200-250 and you are eating 15 grams of Carb: 22 units ?200-250 and you are eating 30 grams of Carb: 24 units ?Higher than 250 and eating less than 15 grams of Carb: 24 units ?Higher than 250 and at least 15 grams of Carb: 26 units ? ?Dr. Valentina Lucks will call in 1 month to follow up.  ? ? ?

## 2021-10-21 NOTE — Progress Notes (Signed)
? ? ?S:    ? ?Chief Complaint  ?Patient presents with  ? Medication Management  ?  Diabetes  ? ? ?Patrick Elliott is a 61 y.o. male who presents for diabetes evaluation, education, and management.   Patient has been seen for many years in pharmacy clinic and is seen by Primary Care Provider, Dr. Posey Pronto.  ? ?PMH is significant for recent Covid infection in January and respiratory illness in February. ?He is planning for his colostomy "take-down" in April and he would like to follow than with a knee replacement later in the year.  He is here today to focus on improving his glycemic control to achieve an A1c < 8 to permit his clearance for surgery.  ? ?Today, arrives in good spirits and ambulating without assistance.  ? ?Current diabetes medications include: Lantus 40 units once daily AND Fiasp 20 units twice daily.  ? ? ?Patient reports hypoglycemic events requiring management several weeks ago.  Shared that the auto-injector of glucagon is much "kinder" in tolerability.  He believes in corrects his sugar but does not overcorrect his blood glucose.   ? ?Average Glucose: 235 mg/dL ?Glucose Management Indicator: 8.9  ?Time in Goal:  ?- Time in range 70-180: 28% ?- Time above range: 72% ?- Time below range: 0% ? ?Patient-reported exercise habits: limited  due to knee - pending replacement.  He is able to ambulate but ireports minimal ability to exercise due to knee and colostomy.  ? ? ?O:  ?Physical Exam ?Constitutional:   ?   Appearance: He is normal weight.  ?Neurological:  ?   Mental Status: He is alert.  ? ? ?Review of Systems  ?Musculoskeletal:  Positive for joint pain.  ?Psychiatric/Behavioral:  Positive for depression. The patient is nervous/anxious.   ?All other systems reviewed and are negative. Working with Dr. Tammi Klippel.  ? ? ?Regained weight to achieve ~ 180 lbs (pre- colostomy/illness weight) ? ?Lab Results  ?Component Value Date  ? HGBA1C 7.5 (A) 06/22/2021  ? ?Vitals:  ? 10/21/21 1456  ?BP: (!) 148/78   ?Pulse: 99  ?SpO2: 98%  ? ? ?Lipid Panel  ?   ?Component Value Date/Time  ? CHOL 168 03/06/2020 1640  ? TRIG 154 (H) 03/06/2020 1640  ? HDL 44 03/06/2020 1640  ? CHOLHDL 3.8 03/06/2020 1640  ? CHOLHDL 3.9 11/02/2016 1551  ? VLDL 23 11/02/2016 1551  ? LDLCALC 97 03/06/2020 1640  ? LDLDIRECT 79 05/29/2009 2224  ? ? ?A/P: ?Diabetes longstanding insulin requiring currently below target control. Patient is able to verbalize appropriate hypoglycemia management plan. Medication adherence appears optimal. Control is suboptimal due to lack of adjustment for glycemic intake and correction. . ?-Continued basal insulin Lantus  (insulin glargine). At 40 units once daily.  ?-Adjusted dose of ultra-rapid insulin Fiasp (insulin aspart) to include a sliding scale as follows.  ?100-200 and you have less than 15 grams of Carb:  20 units ?200-250 and you are eating 15 grams of Carb: 22 units ?200-250 and you are eating 30 grams of Carb: 24 units ?Higher than 250 and eating less than 15 grams of Carb: 24 units ?Higher than 250 and at least 15 grams of Carb: 26 units ?-Continued SGLT2-I Jardiance (generic name empagliflozin) at 25mg  daily.  ?-Continued metformin 500mg  BID.   ?-Patient educated on purpose, proper use, and potential adverse effects.  ?-Extensively discussed pathophysiology of diabetes, recommended lifestyle interventions, dietary effects on blood sugar control.  ?-Counseled on s/sx of and management of hypoglycemia.  ?-  Next A1c anticipated 6-8 weeks.  ? ?Written patient instructions provided. Patient verbalized understanding of treatment plan. Total time in face to face counseling 27 minutes. ? ?Follow up pharmacist by phone in 1 month to review CGM data and potentially make further adjustment.  ?

## 2021-10-21 NOTE — Assessment & Plan Note (Signed)
Diabetes longstanding insulin requiring currently below target control. Patient is able to verbalize appropriate hypoglycemia management plan. Medication adherence appears optimal. Control is suboptimal due to lack of adjustment for glycemic intake and correction. . ?-Continued basal insulin Lantus  (insulin glargine). At 40 units once daily.  ?-Adjusted dose of ultra-rapid insulin Fiasp (insulin aspart) to include a sliding scale as follows.  ?100-200 and you have less than 15 grams of Carb:  20 units ?200-250 and you are eating 15 grams of Carb: 22 units ?200-250 and you are eating 30 grams of Carb: 24 units ?Higher than 250 and eating less than 15 grams of Carb: 24 units ?Higher than 250 and at least 15 grams of Carb: 26 units ?-Continued SGLT2-I Jardiance (generic name empagliflozin) at 25mg  daily.  ?-Continued metformin 500mg  BID.  ?

## 2021-10-26 DIAGNOSIS — Z79899 Other long term (current) drug therapy: Secondary | ICD-10-CM | POA: Diagnosis not present

## 2021-10-26 DIAGNOSIS — F411 Generalized anxiety disorder: Secondary | ICD-10-CM | POA: Diagnosis not present

## 2021-11-02 DIAGNOSIS — Z933 Colostomy status: Secondary | ICD-10-CM | POA: Diagnosis not present

## 2021-11-09 DIAGNOSIS — F411 Generalized anxiety disorder: Secondary | ICD-10-CM | POA: Diagnosis not present

## 2021-11-09 DIAGNOSIS — F3181 Bipolar II disorder: Secondary | ICD-10-CM | POA: Diagnosis not present

## 2021-11-09 DIAGNOSIS — F900 Attention-deficit hyperactivity disorder, predominantly inattentive type: Secondary | ICD-10-CM | POA: Diagnosis not present

## 2021-11-12 LAB — HM DIABETES EYE EXAM

## 2021-11-19 ENCOUNTER — Telehealth: Payer: Self-pay | Admitting: Pharmacist

## 2021-11-19 NOTE — Telephone Encounter (Signed)
Noted and agree. 

## 2021-11-19 NOTE — Telephone Encounter (Signed)
-----   Message from Leavy Cella, [place] sent at 10/21/2021  4:36 PM EST ----- ?Regarding: Diabetes Control prior to surgery ? ? ?

## 2021-11-19 NOTE — Telephone Encounter (Signed)
Patient returned call that I had made to him earlier in the day.  His CGM (Dexcom) report was not sharing data and I could not determine his current control.  ? ?We discussed current blood sugar control and goals for upcoming surgeries.  ?At this time, he believes that his blood sugar control is adequate to have his knee surgery and also for his colostomy - re-anastomoses which are both pending in the coming months.  ? ?I shared with him that I would be out of the office for a few days in early April and shared with him that I would be happy to assist with an insulin dose adjustment if needed later in the month.   ? ?He thanked me for the call.  We made no changes to his insulin regimen.  ?

## 2021-11-21 ENCOUNTER — Other Ambulatory Visit: Payer: Self-pay | Admitting: Family Medicine

## 2021-11-21 DIAGNOSIS — E1140 Type 2 diabetes mellitus with diabetic neuropathy, unspecified: Secondary | ICD-10-CM

## 2021-11-29 DIAGNOSIS — E7800 Pure hypercholesterolemia, unspecified: Secondary | ICD-10-CM | POA: Diagnosis not present

## 2021-12-02 DIAGNOSIS — I10 Essential (primary) hypertension: Secondary | ICD-10-CM | POA: Diagnosis not present

## 2021-12-02 DIAGNOSIS — E118 Type 2 diabetes mellitus with unspecified complications: Secondary | ICD-10-CM | POA: Diagnosis not present

## 2021-12-02 DIAGNOSIS — E7800 Pure hypercholesterolemia, unspecified: Secondary | ICD-10-CM | POA: Diagnosis not present

## 2021-12-03 ENCOUNTER — Emergency Department: Payer: Medicaid Other

## 2021-12-03 ENCOUNTER — Encounter: Payer: Self-pay | Admitting: Internal Medicine

## 2021-12-03 ENCOUNTER — Inpatient Hospital Stay
Admission: EM | Admit: 2021-12-03 | Discharge: 2021-12-05 | DRG: 071 | Disposition: A | Discharge status: Home / Self Care | Payer: Medicaid Other | Attending: Family Medicine | Admitting: Family Medicine

## 2021-12-03 DIAGNOSIS — I5032 Chronic diastolic (congestive) heart failure: Secondary | ICD-10-CM | POA: Diagnosis not present

## 2021-12-03 DIAGNOSIS — Y92009 Unspecified place in unspecified non-institutional (private) residence as the place of occurrence of the external cause: Secondary | ICD-10-CM

## 2021-12-03 DIAGNOSIS — G9341 Metabolic encephalopathy: Principal | ICD-10-CM | POA: Diagnosis present

## 2021-12-03 DIAGNOSIS — R4182 Altered mental status, unspecified: Secondary | ICD-10-CM | POA: Diagnosis present

## 2021-12-03 DIAGNOSIS — K7469 Other cirrhosis of liver: Secondary | ICD-10-CM

## 2021-12-03 DIAGNOSIS — D696 Thrombocytopenia, unspecified: Secondary | ICD-10-CM

## 2021-12-03 DIAGNOSIS — I6529 Occlusion and stenosis of unspecified carotid artery: Secondary | ICD-10-CM | POA: Diagnosis not present

## 2021-12-03 DIAGNOSIS — W19XXXA Unspecified fall, initial encounter: Secondary | ICD-10-CM | POA: Diagnosis present

## 2021-12-03 DIAGNOSIS — D72819 Decreased white blood cell count, unspecified: Secondary | ICD-10-CM | POA: Diagnosis not present

## 2021-12-03 DIAGNOSIS — K7460 Unspecified cirrhosis of liver: Secondary | ICD-10-CM | POA: Diagnosis present

## 2021-12-03 DIAGNOSIS — T50904A Poisoning by unspecified drugs, medicaments and biological substances, undetermined, initial encounter: Secondary | ICD-10-CM | POA: Diagnosis not present

## 2021-12-03 DIAGNOSIS — E1140 Type 2 diabetes mellitus with diabetic neuropathy, unspecified: Secondary | ICD-10-CM | POA: Diagnosis present

## 2021-12-03 DIAGNOSIS — Z8249 Family history of ischemic heart disease and other diseases of the circulatory system: Secondary | ICD-10-CM

## 2021-12-03 DIAGNOSIS — F319 Bipolar disorder, unspecified: Secondary | ICD-10-CM | POA: Diagnosis present

## 2021-12-03 DIAGNOSIS — F3175 Bipolar disorder, in partial remission, most recent episode depressed: Secondary | ICD-10-CM | POA: Diagnosis not present

## 2021-12-03 DIAGNOSIS — R404 Transient alteration of awareness: Secondary | ICD-10-CM | POA: Diagnosis not present

## 2021-12-03 DIAGNOSIS — R4020 Unspecified coma: Secondary | ICD-10-CM | POA: Diagnosis not present

## 2021-12-03 DIAGNOSIS — R442 Other hallucinations: Secondary | ICD-10-CM | POA: Diagnosis not present

## 2021-12-03 DIAGNOSIS — F29 Unspecified psychosis not due to a substance or known physiological condition: Secondary | ICD-10-CM | POA: Diagnosis not present

## 2021-12-03 DIAGNOSIS — R401 Stupor: Secondary | ICD-10-CM | POA: Diagnosis not present

## 2021-12-03 DIAGNOSIS — Z933 Colostomy status: Secondary | ICD-10-CM

## 2021-12-03 DIAGNOSIS — R443 Hallucinations, unspecified: Secondary | ICD-10-CM | POA: Diagnosis present

## 2021-12-03 DIAGNOSIS — I110 Hypertensive heart disease with heart failure: Secondary | ICD-10-CM | POA: Diagnosis present

## 2021-12-03 DIAGNOSIS — S0181XA Laceration without foreign body of other part of head, initial encounter: Secondary | ICD-10-CM | POA: Diagnosis present

## 2021-12-03 DIAGNOSIS — K219 Gastro-esophageal reflux disease without esophagitis: Secondary | ICD-10-CM | POA: Diagnosis present

## 2021-12-03 DIAGNOSIS — I440 Atrioventricular block, first degree: Secondary | ICD-10-CM | POA: Diagnosis present

## 2021-12-03 DIAGNOSIS — Z20822 Contact with and (suspected) exposure to covid-19: Secondary | ICD-10-CM | POA: Diagnosis present

## 2021-12-03 DIAGNOSIS — T887XXA Unspecified adverse effect of drug or medicament, initial encounter: Secondary | ICD-10-CM | POA: Diagnosis not present

## 2021-12-03 DIAGNOSIS — K559 Vascular disorder of intestine, unspecified: Secondary | ICD-10-CM | POA: Diagnosis present

## 2021-12-03 DIAGNOSIS — Z888 Allergy status to other drugs, medicaments and biological substances status: Secondary | ICD-10-CM

## 2021-12-03 DIAGNOSIS — T43625A Adverse effect of amphetamines, initial encounter: Secondary | ICD-10-CM | POA: Diagnosis present

## 2021-12-03 DIAGNOSIS — Z87891 Personal history of nicotine dependence: Secondary | ICD-10-CM

## 2021-12-03 DIAGNOSIS — T148XXA Other injury of unspecified body region, initial encounter: Secondary | ICD-10-CM

## 2021-12-03 DIAGNOSIS — I10 Essential (primary) hypertension: Secondary | ICD-10-CM | POA: Diagnosis present

## 2021-12-03 LAB — COMPREHENSIVE METABOLIC PANEL
ALT: 28 U/L (ref 0–44)
AST: 25 U/L (ref 15–41)
Albumin: 3.8 g/dL (ref 3.5–5.0)
Alkaline Phosphatase: 45 U/L (ref 38–126)
Anion gap: 6 (ref 5–15)
BUN: 15 mg/dL (ref 8–23)
CO2: 22 mmol/L (ref 22–32)
Calcium: 9.4 mg/dL (ref 8.9–10.3)
Chloride: 109 mmol/L (ref 98–111)
Creatinine, Ser: 1.13 mg/dL (ref 0.61–1.24)
GFR, Estimated: >60 mL/min (ref >60)
Glucose, Bld: 197 mg/dL — ABNORMAL HIGH (ref 70–99)
Potassium: 4.5 mmol/L (ref 3.5–5.1)
Sodium: 137 mmol/L (ref 135–145)
Total Bilirubin: 1.1 mg/dL (ref 0.3–1.2)
Total Protein: 6.8 g/dL (ref 6.5–8.1)

## 2021-12-03 LAB — CBC WITH DIFFERENTIAL/PLATELET
Abs Immature Granulocytes: 0.01 10*3/uL (ref 0.00–0.07)
Basophils Absolute: 0 10*3/uL (ref 0.0–0.1)
Basophils Relative: 0 %
Eosinophils Absolute: 0.1 10*3/uL (ref 0.0–0.5)
Eosinophils Relative: 5 %
HCT: 36.3 % — ABNORMAL LOW (ref 39.0–52.0)
Hemoglobin: 11.4 g/dL — ABNORMAL LOW (ref 13.0–17.0)
Immature Granulocytes: 0 %
Lymphocytes Relative: 21 %
Lymphs Abs: 0.6 10*3/uL — ABNORMAL LOW (ref 0.7–4.0)
MCH: 26.5 pg (ref 26.0–34.0)
MCHC: 31.4 g/dL (ref 30.0–36.0)
MCV: 84.2 fL (ref 80.0–100.0)
Monocytes Absolute: 0.3 10*3/uL (ref 0.1–1.0)
Monocytes Relative: 11 %
Neutro Abs: 1.6 10*3/uL — ABNORMAL LOW (ref 1.7–7.7)
Neutrophils Relative %: 63 %
Platelets: 71 10*3/uL — ABNORMAL LOW (ref 150–400)
RBC: 4.31 MIL/uL (ref 4.22–5.81)
RDW: 14.4 % (ref 11.5–15.5)
Smear Review: NORMAL
WBC: 2.7 10*3/uL — ABNORMAL LOW (ref 4.0–10.5)
nRBC: 0 % (ref 0.0–0.2)

## 2021-12-03 LAB — RESP PANEL BY RT-PCR (FLU A&B, COVID) ARPGX2
Influenza A by PCR: NEGATIVE
Influenza B by PCR: NEGATIVE
SARS Coronavirus 2 by RT PCR: NEGATIVE

## 2021-12-03 LAB — TSH: TSH: 2.457 u[IU]/mL (ref 0.350–4.500)

## 2021-12-03 LAB — LACTIC ACID, PLASMA: Lactic Acid, Venous: 1.3 mmol/L (ref 0.5–1.9)

## 2021-12-03 LAB — BLOOD GAS, VENOUS
Acid-base deficit: 0.7 mmol/L (ref 0.0–2.0)
Bicarbonate: 26.2 mmol/L (ref 20.0–28.0)
O2 Saturation: 40.2 %
Patient temperature: 37
pCO2, Ven: 52 mmHg (ref 44–60)
pH, Ven: 7.31 (ref 7.25–7.43)
pO2, Ven: <31 mmHg — CL (ref 32–45)

## 2021-12-03 LAB — LITHIUM LEVEL: Lithium Lvl: 0.64 mmol/L (ref 0.60–1.20)

## 2021-12-03 LAB — AMMONIA: Ammonia: 22 umol/L (ref 9–35)

## 2021-12-03 LAB — ETHANOL: Alcohol, Ethyl (B): <10 mg/dL (ref <10)

## 2021-12-03 LAB — ACETAMINOPHEN LEVEL: Acetaminophen (Tylenol), Serum: <10 ug/mL — ABNORMAL LOW (ref 10–30)

## 2021-12-03 LAB — SALICYLATE LEVEL: Salicylate Lvl: <7 mg/dL — ABNORMAL LOW (ref 7.0–30.0)

## 2021-12-03 LAB — D-DIMER, QUANTITATIVE: D-Dimer, Quant: 0.55 ug/mL-FEU — ABNORMAL HIGH (ref 0.00–0.50)

## 2021-12-03 LAB — TROPONIN I (HIGH SENSITIVITY): Troponin I (High Sensitivity): 10 ng/L (ref <18)

## 2021-12-03 MED ORDER — INSULIN ASPART 100 UNIT/ML IJ SOLN
0.0000 [IU] | Freq: Three times a day (TID) | INTRAMUSCULAR | Status: DC
  Administered 2021-12-04 – 2021-12-05 (×2): 1 [IU] via SUBCUTANEOUS
  Filled 2021-12-03 (×2): qty 1

## 2021-12-03 MED ORDER — HEPARIN SODIUM (PORCINE) 5000 UNIT/ML IJ SOLN
5000.0000 [IU] | Freq: Three times a day (TID) | INTRAMUSCULAR | Status: DC
  Administered 2021-12-04 – 2021-12-05 (×3): 5000 [IU] via SUBCUTANEOUS
  Filled 2021-12-03 (×3): qty 1

## 2021-12-03 MED ORDER — SODIUM CHLORIDE 0.9% FLUSH
3.0000 mL | Freq: Two times a day (BID) | INTRAVENOUS | Status: DC
  Administered 2021-12-04 (×3): 3 mL via INTRAVENOUS

## 2021-12-03 MED ORDER — LACTATED RINGERS IV BOLUS
1000.0000 mL | Freq: Once | INTRAVENOUS | Status: AC
  Administered 2021-12-03: 1000 mL via INTRAVENOUS
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
## 2021-12-03 NOTE — ED Triage Notes (Addendum)
Pt arrives with [facility] after family called out stating pt was having psychotic episode. Upon arrival, pt had agonal breathing with O2 sat 83%; pt was bagged and placed on 2L O2 with O2 sat 99%, end tidal in the 30's per EMS. Pt given a total of 4 mg Narcan by EMS; per family, no hx of drug use. Pt has distended abdomen with ostomy bag on left side of abdomen. CBG 211, HR in 60's, BP 124/70, temp 98.7 axillary. Pt responsive to pain. ?

## 2021-12-03 NOTE — ED Notes (Signed)
Patient transported to CT 

## 2021-12-03 NOTE — ED Provider Notes (Signed)
? ?[facility] ?Provider Note ? ? ? Event Date/Time  ? First MD Initiated Contact with Patient 12/03/21 2012   ?  (approximate) ? ? ?History  ? ?Altered Mental Status and unresponsive ? ? ?HPI ? ?Patrick Elliott is a 61 y.o. male with a past medical history of cirrhosis, DM, bipolar disorder, and ischemic colitis status post perforation and Hartman's procedure 8/18 who presents via EMS from home for evaluation of unresponsiveness.  EMS was called out for unresponsiveness on arrival patient was notably unresponsive with stable vitals.  He was given some Narcan and reportedly responded little bit.  He was noted to have SPO2 on arrival of 83% improved to the mid 90s after the Narcan.  He is known to have a distended abdomen, colostomy bag in place.  No other history is available from patient on arrival as he is nonverbal. ? ?  ? ? ?Physical Exam  ?Triage Vital Signs: ?ED Triage Vitals [12/03/21 2000]  ?Enc Vitals Group  ?   BP 121/67  ?   Pulse Rate 65  ?   Resp 14  ?   Temp (!) 97.1 ?F (36.2 ?C)  ?   Temp Source Rectal  ?   SpO2 99 %  ?   Weight   ?   Height   ?   Head Circumference   ?   Peak Flow   ?   Pain Score   ?   Pain Loc   ?   Pain Edu?   ?   Excl. in [place]?   ? ? ?Most recent vital signs: ?Vitals:  ? 12/03/21 2240 12/03/21 2300  ?BP: 125/73 129/72  ?Pulse: 72 63  ?Resp: (!) 22 14  ?Temp:    ?SpO2: 99% 100%  ? ? ?General: Awake, is toxic appearing and moaning. ?CV:  2+ radial pulses.  Prolonged capillary fill in the digits.  No significant murmur. ?Resp:  Normal effort.  Clear bilaterally. ?Abd:  Stented with soft with ostomy bag in place. ?Other:  Some ecchymosis over the left lower thigh.  There is also a scratch over the right eyebrow.  No other obvious trauma to the face scalp head neck back torso or extremities.  Patient withdraws all extremities to noxious stimuli.  Pulls are symmetric equally round and reactive to light. ? ? ?ED Results / Procedures / Treatments  ?Labs ?(all labs  ordered are listed, but only abnormal results are displayed) ?Labs Reviewed  ?BLOOD GAS, VENOUS - Abnormal; Notable for the following components:  ?    Result Value  ? pO2, Ven <31 (*)   ? All other components within normal limits  ?CBC WITH DIFFERENTIAL/PLATELET - Abnormal; Notable for the following components:  ? WBC 2.7 (*)   ? Hemoglobin 11.4 (*)   ? HCT 36.3 (*)   ? Platelets 71 (*)   ? Neutro Abs 1.6 (*)   ? Lymphs Abs 0.6 (*)   ? All other components within normal limits  ?COMPREHENSIVE METABOLIC PANEL - Abnormal; Notable for the following components:  ? Glucose, Bld 197 (*)   ? All other components within normal limits  ?SALICYLATE LEVEL - Abnormal; Notable for the following components:  ? Salicylate Lvl <6.7 (*)   ? All other components within normal limits  ?ACETAMINOPHEN LEVEL - Abnormal; Notable for the following components:  ? Acetaminophen (Tylenol), Serum <10 (*)   ? All other components within normal limits  ?D-DIMER, QUANTITATIVE - Abnormal; Notable for the following  components:  ? D-Dimer, Quant 0.55 (*)   ? All other components within normal limits  ?RESP PANEL BY RT-PCR (FLU A&B, COVID) [id number]  ?CULTURE, BLOOD (ROUTINE X 2)  ?CULTURE, BLOOD (ROUTINE X 2)  ?URINE CULTURE  ?LITHIUM LEVEL  ?AMMONIA  ?ETHANOL  ?TSH  ?LACTIC ACID, PLASMA  ?LAMOTRIGINE LEVEL  ?URINALYSIS, COMPLETE (UACMP) WITH MICROSCOPIC  ?URINE DRUG SCREEN, QUALITATIVE ([facility] ONLY)  ?COMPREHENSIVE METABOLIC PANEL  ?MAGNESIUM  ?PHOSPHORUS  ?TSH  ?BRAIN NATRIURETIC PEPTIDE  ?HEMOGLOBIN A1C  ?COMPREHENSIVE METABOLIC PANEL  ?CBC  ?T4, FREE  ?CK  ?RPR  ?LYME DISEASE SEROLOGY W/REFLEX  ?SEDIMENTATION RATE  ?PROCALCITONIN  ?PROCALCITONIN  ?TROPONIN I (HIGH SENSITIVITY)  ? ? ? ?EKG ? ?ECG is remarkable sinus rhythm with a ventricular rate of 64, normal axis, personally block with a PR interval of 230, otherwise unremarkable intervals without evidence of acute ischemia or significant arrhythmia. ? ? ?RADIOLOGY ? ? ?CT head and C-spine my  interpretation without evidence of skull fracture, acute C-spine injury, intracranial hemorrhage, acute ischemia, mass effect edema or other acute process.  I also reviewed radiologist interpretation. ? ?Chest reviewed by myself shows no focal consoidation, effusion, edema, pneumothorax or other clear acute thoracic process. I also reviewed radiology interpretation and agree with findings described. ? ?Plain film of the pelvis ordered interpreted myself without evidence of fracture or dislocation.  I also reviewed radiologist interpretation. ? ? ?PROCEDURES: ? ?Critical Care performed: Yes, see critical care procedure note(s) ? ?.Critical Care ?Performed by: Lucrezia Starch, MD ?Authorized by: Lucrezia Starch, MD  ? ?Critical care provider statement:  ?  Critical care time (minutes):  30 ?  Critical care was necessary to treat or prevent imminent or life-threatening deterioration of the following conditions:  CNS failure or compromise ?  Critical care was time spent personally by me on the following activities:  Development of treatment plan with patient or surrogate, discussions with consultants, evaluation of patient's response to treatment, examination of patient, ordering and review of laboratory studies, ordering and review of radiographic studies, ordering and performing treatments and interventions, pulse oximetry, re-evaluation of patient's condition and review of old charts ?.1-3 Lead EKG Interpretation ?Performed by: Lucrezia Starch, MD ?Authorized by: Lucrezia Starch, MD  ? ?  Interpretation: normal   ?  ECG rate assessment: normal   ?  Rhythm: sinus rhythm   ?  Ectopy: none   ?  Conduction: abnormal   ?  Abnormal conduction: 1st degree AV block   ? ?The patient is on the cardiac monitor to evaluate for evidence of arrhythmia and/or significant heart rate changes. ? ? ?MEDICATIONS ORDERED IN ED: ?Medications  ?sodium chloride flush (NS) 0.9 % injection 3 mL (has no administration in time range)   ?heparin injection 5,000 Units (has no administration in time range)  ?insulin aspart (novoLOG) injection 0-6 Units (has no administration in time range)  ?iohexol (OMNIPAQUE) 350 MG/ML injection 100 mL (has no administration in time range)  ?lactated ringers bolus 1,000 mL (0 mLs Intravenous Stopped 12/03/21 2324)  ? ? ? ?IMPRESSION / MDM / ASSESSMENT AND PLAN / ED COURSE  ?I reviewed the triage vital signs and the nursing notes. ?             ?               ? ?Differential diagnosis includes, but is not limited to CVA, SAH versus other significant traumatic brain injury from fall, arrhythmia, anemia, metabolic derangements,  acute infectious process, toxic ingestion, endocrine derangements, and hepatic encephalopathy. ? ?CT head and C-spine my interpretation without evidence of skull fracture, acute C-spine injury, intracranial hemorrhage, acute ischemia, mass effect edema or other acute process.  I also reviewed radiologist interpretation. ? ?Chest reviewed by myself shows no focal consoidation, effusion, edema, pneumothorax or other clear acute thoracic process. I also reviewed radiology interpretation and agree with findings described. ? ?Plain film of the pelvis ordered interpreted myself without evidence of fracture or dislocation.  I also reviewed radiologist interpretation. ? ? ?EKG without significant arrhythmia and nonelevated troponin not suggestive of ACS.  Lithium level is therapeutic at 0.64.  Ammonia is within normal limits.  He is remarkable WBC count of 2.7, hemoglobin 11.4 and platelets of 71.  It seems patient's platelets were last checked when he was hospitalized 7 months ago were between 79 and 106.  CMP shows a glucose of 197 without any other significant electrolyte or metabolic derangements.  Serum acetaminophen level salicylate level and ethanol level undetectable.  Lactic acid is not elevated at 1.3.  COVID influenza PCR negative.  VBG remarkable for mild hypercarbic acidosis with a pH of  7.31 and PCO2 of 52 and bicarb of 26.2. ? ?Overall unclear etiology for his encephalopathy and reported fall earlier today.  I will admit to medicine service for further evaluation and management. ?  ? ? ?FINAL CL

## 2021-12-03 NOTE — Assessment & Plan Note (Addendum)
Patient presented with encephalopathy as evidenced by somnolence. ?Patient was nonresponsive on arrival.  Work-up so far unremarkable. ?Suspicion was drug ingestion but there is no other evidence of infection at this time. ?Other differentials include metabolic encephalopathy, TIA/CVA. ?Obtain lithium and Lamictal levels. ?CT head unremarkable, MRI no acute abnormality noted. ?UA unremarkable, other labs unremarkable. ?CTA chest, abdomen pelvis no acute abnormality noted.: ?Patient has improved.  He is alert oriented following full commands. ?Urine drug screen positive for amphetamines and tricyclics. ?Encephalopathy resolved.  Patient wants to be discharged. ?

## 2021-12-03 NOTE — Assessment & Plan Note (Addendum)
Hold metformin. ?Start regular insulin sliding scale. ? ?

## 2021-12-04 ENCOUNTER — Observation Stay: Payer: Medicaid Other

## 2021-12-04 ENCOUNTER — Encounter: Payer: Self-pay | Admitting: Radiology

## 2021-12-04 DIAGNOSIS — D696 Thrombocytopenia, unspecified: Secondary | ICD-10-CM | POA: Diagnosis not present

## 2021-12-04 DIAGNOSIS — Z87891 Personal history of nicotine dependence: Secondary | ICD-10-CM | POA: Diagnosis not present

## 2021-12-04 DIAGNOSIS — K219 Gastro-esophageal reflux disease without esophagitis: Secondary | ICD-10-CM | POA: Diagnosis not present

## 2021-12-04 DIAGNOSIS — T43625A Adverse effect of amphetamines, initial encounter: Secondary | ICD-10-CM | POA: Diagnosis not present

## 2021-12-04 DIAGNOSIS — K8020 Calculus of gallbladder without cholecystitis without obstruction: Secondary | ICD-10-CM | POA: Diagnosis not present

## 2021-12-04 DIAGNOSIS — I2510 Atherosclerotic heart disease of native coronary artery without angina pectoris: Secondary | ICD-10-CM | POA: Diagnosis not present

## 2021-12-04 DIAGNOSIS — E1140 Type 2 diabetes mellitus with diabetic neuropathy, unspecified: Secondary | ICD-10-CM | POA: Diagnosis not present

## 2021-12-04 DIAGNOSIS — T148XXA Other injury of unspecified body region, initial encounter: Secondary | ICD-10-CM | POA: Diagnosis not present

## 2021-12-04 DIAGNOSIS — F29 Unspecified psychosis not due to a substance or known physiological condition: Secondary | ICD-10-CM | POA: Diagnosis not present

## 2021-12-04 DIAGNOSIS — R4182 Altered mental status, unspecified: Secondary | ICD-10-CM | POA: Diagnosis not present

## 2021-12-04 DIAGNOSIS — G9341 Metabolic encephalopathy: Secondary | ICD-10-CM | POA: Diagnosis not present

## 2021-12-04 DIAGNOSIS — I110 Hypertensive heart disease with heart failure: Secondary | ICD-10-CM | POA: Diagnosis not present

## 2021-12-04 DIAGNOSIS — K559 Vascular disorder of intestine, unspecified: Secondary | ICD-10-CM | POA: Diagnosis not present

## 2021-12-04 DIAGNOSIS — F319 Bipolar disorder, unspecified: Secondary | ICD-10-CM | POA: Diagnosis not present

## 2021-12-04 DIAGNOSIS — W19XXXA Unspecified fall, initial encounter: Secondary | ICD-10-CM | POA: Diagnosis present

## 2021-12-04 DIAGNOSIS — I6529 Occlusion and stenosis of unspecified carotid artery: Secondary | ICD-10-CM | POA: Diagnosis not present

## 2021-12-04 DIAGNOSIS — J9811 Atelectasis: Secondary | ICD-10-CM | POA: Diagnosis not present

## 2021-12-04 DIAGNOSIS — Z20822 Contact with and (suspected) exposure to covid-19: Secondary | ICD-10-CM | POA: Diagnosis not present

## 2021-12-04 DIAGNOSIS — I440 Atrioventricular block, first degree: Secondary | ICD-10-CM | POA: Diagnosis not present

## 2021-12-04 DIAGNOSIS — Z8249 Family history of ischemic heart disease and other diseases of the circulatory system: Secondary | ICD-10-CM | POA: Diagnosis not present

## 2021-12-04 DIAGNOSIS — R0902 Hypoxemia: Secondary | ICD-10-CM | POA: Diagnosis not present

## 2021-12-04 DIAGNOSIS — R55 Syncope and collapse: Secondary | ICD-10-CM | POA: Diagnosis not present

## 2021-12-04 DIAGNOSIS — Z933 Colostomy status: Secondary | ICD-10-CM | POA: Diagnosis not present

## 2021-12-04 DIAGNOSIS — R443 Hallucinations, unspecified: Secondary | ICD-10-CM | POA: Diagnosis not present

## 2021-12-04 DIAGNOSIS — D72819 Decreased white blood cell count, unspecified: Secondary | ICD-10-CM | POA: Diagnosis not present

## 2021-12-04 DIAGNOSIS — R4020 Unspecified coma: Secondary | ICD-10-CM | POA: Diagnosis not present

## 2021-12-04 DIAGNOSIS — Y92009 Unspecified place in unspecified non-institutional (private) residence as the place of occurrence of the external cause: Secondary | ICD-10-CM | POA: Diagnosis not present

## 2021-12-04 DIAGNOSIS — I5032 Chronic diastolic (congestive) heart failure: Secondary | ICD-10-CM | POA: Diagnosis not present

## 2021-12-04 DIAGNOSIS — R401 Stupor: Secondary | ICD-10-CM | POA: Diagnosis not present

## 2021-12-04 DIAGNOSIS — Z888 Allergy status to other drugs, medicaments and biological substances status: Secondary | ICD-10-CM | POA: Diagnosis not present

## 2021-12-04 DIAGNOSIS — K7460 Unspecified cirrhosis of liver: Secondary | ICD-10-CM | POA: Diagnosis not present

## 2021-12-04 DIAGNOSIS — S0181XA Laceration without foreign body of other part of head, initial encounter: Secondary | ICD-10-CM | POA: Diagnosis not present

## 2021-12-04 LAB — URINALYSIS, COMPLETE (UACMP) WITH MICROSCOPIC
Bacteria, UA: NONE SEEN
Bilirubin Urine: NEGATIVE
Glucose, UA: >=500 mg/dL — AB
Hgb urine dipstick: NEGATIVE
Ketones, ur: NEGATIVE mg/dL
Leukocytes,Ua: NEGATIVE
Nitrite: NEGATIVE
Protein, ur: NEGATIVE mg/dL
Specific Gravity, Urine: 1.021 (ref 1.005–1.030)
Squamous Epithelial / HPF: NONE SEEN (ref 0–5)
pH: 7 (ref 5.0–8.0)

## 2021-12-04 LAB — COMPREHENSIVE METABOLIC PANEL
ALT: 26 U/L (ref 0–44)
ALT: 25 U/L (ref 0–44)
AST: 21 U/L (ref 15–41)
AST: 20 U/L (ref 15–41)
Albumin: 3.4 g/dL — ABNORMAL LOW (ref 3.5–5.0)
Albumin: 3.4 g/dL — ABNORMAL LOW (ref 3.5–5.0)
Alkaline Phosphatase: 42 U/L (ref 38–126)
Alkaline Phosphatase: 43 U/L (ref 38–126)
Anion gap: 6 (ref 5–15)
Anion gap: 5 (ref 5–15)
BUN: 14 mg/dL (ref 8–23)
BUN: 13 mg/dL (ref 8–23)
CO2: 24 mmol/L (ref 22–32)
CO2: 24 mmol/L (ref 22–32)
Calcium: 8.8 mg/dL — ABNORMAL LOW (ref 8.9–10.3)
Calcium: 8.8 mg/dL — ABNORMAL LOW (ref 8.9–10.3)
Chloride: 108 mmol/L (ref 98–111)
Chloride: 109 mmol/L (ref 98–111)
Creatinine, Ser: 0.81 mg/dL (ref 0.61–1.24)
Creatinine, Ser: 0.81 mg/dL (ref 0.61–1.24)
GFR, Estimated: >60 mL/min (ref >60)
GFR, Estimated: >60 mL/min (ref >60)
Glucose, Bld: 126 mg/dL — ABNORMAL HIGH (ref 70–99)
Glucose, Bld: 125 mg/dL — ABNORMAL HIGH (ref 70–99)
Potassium: 4.1 mmol/L (ref 3.5–5.1)
Potassium: 3.9 mmol/L (ref 3.5–5.1)
Sodium: 138 mmol/L (ref 135–145)
Sodium: 138 mmol/L (ref 135–145)
Total Bilirubin: 1.4 mg/dL — ABNORMAL HIGH (ref 0.3–1.2)
Total Bilirubin: 1.4 mg/dL — ABNORMAL HIGH (ref 0.3–1.2)
Total Protein: 6.2 g/dL — ABNORMAL LOW (ref 6.5–8.1)
Total Protein: 6.1 g/dL — ABNORMAL LOW (ref 6.5–8.1)

## 2021-12-04 LAB — PROCALCITONIN
Procalcitonin: <0.1 ng/mL
Procalcitonin: <0.1 ng/mL

## 2021-12-04 LAB — URINE DRUG SCREEN, QUALITATIVE (ARMC ONLY)
Amphetamines, Ur Screen: POSITIVE — AB
Barbiturates, Ur Screen: NOT DETECTED
Benzodiazepine, Ur Scrn: NOT DETECTED
Cannabinoid 50 Ng, Ur ~~LOC~~: NOT DETECTED
Cocaine Metabolite,Ur ~~LOC~~: NOT DETECTED
MDMA (Ecstasy)Ur Screen: NOT DETECTED
Methadone Scn, Ur: NOT DETECTED
Opiate, Ur Screen: NOT DETECTED
Phencyclidine (PCP) Ur S: NOT DETECTED
Tricyclic, Ur Screen: POSITIVE — AB

## 2021-12-04 LAB — RPR: RPR Ser Ql: NONREACTIVE

## 2021-12-04 LAB — CBC
HCT: 37.7 % — ABNORMAL LOW (ref 39.0–52.0)
Hemoglobin: 11.5 g/dL — ABNORMAL LOW (ref 13.0–17.0)
MCH: 27.1 pg (ref 26.0–34.0)
MCHC: 30.5 g/dL (ref 30.0–36.0)
MCV: 88.7 fL (ref 80.0–100.0)
Platelets: 78 10*3/uL — ABNORMAL LOW (ref 150–400)
RBC: 4.25 MIL/uL (ref 4.22–5.81)
RDW: 14.6 % (ref 11.5–15.5)
WBC: 3 10*3/uL — ABNORMAL LOW (ref 4.0–10.5)
nRBC: 0 % (ref 0.0–0.2)

## 2021-12-04 LAB — CK: Total CK: 94 U/L (ref 49–397)

## 2021-12-04 LAB — MAGNESIUM: Magnesium: 2 mg/dL (ref 1.7–2.4)

## 2021-12-04 LAB — CBG MONITORING, ED: Glucose-Capillary: 119 mg/dL — ABNORMAL HIGH (ref 70–99)

## 2021-12-04 LAB — HEMOGLOBIN A1C
Hgb A1c MFr Bld: 7.4 % — ABNORMAL HIGH (ref 4.8–5.6)
Mean Plasma Glucose: 165.68 mg/dL

## 2021-12-04 LAB — PHOSPHORUS: Phosphorus: 3.6 mg/dL (ref 2.5–4.6)

## 2021-12-04 LAB — BRAIN NATRIURETIC PEPTIDE: B Natriuretic Peptide: 75.7 pg/mL (ref 0.0–100.0)

## 2021-12-04 LAB — SEDIMENTATION RATE: Sed Rate: 10 mm/hr (ref 0–16)

## 2021-12-04 LAB — TSH: TSH: 1.967 u[IU]/mL (ref 0.350–4.500)

## 2021-12-04 LAB — GLUCOSE, CAPILLARY: Glucose-Capillary: 175 mg/dL — ABNORMAL HIGH (ref 70–99)

## 2021-12-04 LAB — T4, FREE: Free T4: 0.58 ng/dL — ABNORMAL LOW (ref 0.61–1.12)

## 2021-12-04 MED ORDER — IOHEXOL 350 MG/ML SOLN
100.0000 mL | Freq: Once | INTRAVENOUS | Status: AC | PRN
  Administered 2021-12-04: 100 mL via INTRAVENOUS

## 2021-12-04 NOTE — ED Notes (Signed)
This RN called & spoke with Baxter Flattery in dietary services to request a tray for pt. She will send a tray up. ?

## 2021-12-04 NOTE — ED Notes (Signed)
Patient transported to CT 

## 2021-12-04 NOTE — Assessment & Plan Note (Addendum)
Patient does not appear volume overloaded. ?Patient does not want to wait for echocardiogram.  Echocardiogram can be done outpatient.  Patient is being discharged ? ?

## 2021-12-04 NOTE — Hospital Course (Addendum)
This 61 years old male with PMH significant for liver cirrhosis, ischemic colitis s/p Hartman's procedure in 03/2017, diabetes mellitus type 2, bipolar, thrombocytopenia, false positive HIV test with negative RNA came in the ED with altered mentation, syncope and collapse.  Patient was found confused, patient left his friend a voice message, she could not understand what he was saying.  She also reported patient was having hallucinations.  She went to pick him up and patient had a laceration on the right forehead and had a fall seems like he has passed out briefly.  Patient was confused and could not dress himself so she called 911 and patient was brought in the ED.  Patient is admitted for altered mental status.  Work-up so far unremarkable.  CT head, MRI unremarkable.  UA normal all blood work normal.  Urine drug screen positive for amphetamines and tricyclic's.  Patient back to his baseline mental status patient has been ambulatory, denies any other symptoms.  Patient feels better want to be discharged.  Patient is being discharged home ?

## 2021-12-04 NOTE — Assessment & Plan Note (Addendum)
Resume home medications

## 2021-12-04 NOTE — ED Notes (Signed)
Patient returned to room from MRI

## 2021-12-04 NOTE — ED Notes (Signed)
Patient transported to MRI 

## 2021-12-04 NOTE — ED Notes (Signed)
This RN spoke with pt placement who states that pt will still be going to the same unit, but a different room closer to the nurse's station & that room is not clean yet. ?

## 2021-12-04 NOTE — Assessment & Plan Note (Signed)
Monitor liver function. ? ?

## 2021-12-04 NOTE — Assessment & Plan Note (Addendum)
Lactic acid 1.4. ?CTA chest, abdomen pelvis no acute abnormality noted. ?

## 2021-12-04 NOTE — ED Notes (Signed)
Visitor at BS 

## 2021-12-04 NOTE — ED Notes (Signed)
When completing In & out cath for urine specimen, patient awoke, was responsive. Patient was still somnolent, but was able to converse and tell this RN  that he is in the hospital. Notified Dr. Posey Pronto.  ?

## 2021-12-04 NOTE — ED Notes (Signed)
Pt does not want any visitors except the person currently at the bedside. This RN communicated to receiving RN. ?

## 2021-12-04 NOTE — ED Notes (Signed)
Patient's best friend (has been at patient bedside all night). Please call with updates: ? ?Susie ?[phone] ?

## 2021-12-04 NOTE — Progress Notes (Signed)
?Progress Note ? ? ?PatientKAMAAL CAST Elliott:[id number] DOB: 1960-10-06 DOA: 12/03/2021     0 ?DOS: the patient was seen and examined on 12/04/2021 ?  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  Patient was found confused, patient left his friend a voice message, she could not understand what he was saying.  She also reported patient was having hallucinations.  She went to pick him up and patient had a laceration on the right forehead and had a fall seems like he has passed out briefly.  Patient was confused and could not dress himself so she called 911 and patient was brought in the ED.  Patient is admitted for altered mental status. ? ?Assessment and Plan: ?* AMS (altered mental status) ?Patient presented with encephalopathy as evidenced by somnolence. ?Patient was nonresponsive on arrival.  Work-up so far unremarkable. ?Suspicion was drug ingestion but there is no other evidence of infection at this time. ?Other differentials include metabolic encephalopathy, TIA/CVA. ?Obtain lithium and Lamictal levels. ?CT head unremarkable, MRI no acute abnormality noted. ?UA unremarkable, other labs unremarkable. ?CTA chest, abdomen pelvis no acute abnormality noted.: ?Patient has improved.  He is alert oriented following full commands. ?Urine drug screen positive for amphetamines and tricyclics. ? ?Ischemic colitis ([place]) ?Lactic acid 1.4. ?CTA chest, abdomen pelvis no acute abnormality noted. ? ?Cirrhosis ([place]) ?Monitor liver function. ? ? ?Chronic diastolic congestive heart failure ([place]) ?Obtain 2D echocardiogram. ?Patient does not appear volume overloaded. ? ? ?GERD ?Continue pantoprazole. ? ?HYPERTENSION, BENIGN ESSENTIAL ?Hold blood pressure medications and blood  pressure is on the lower side. ? ?Bipolar disorder ([place]) ?Resume home medications. ? ?Diabetes mellitus with neuropathy ([place]) ?Hold metformin. ?Start regular insulin sliding scale. ? ? ? ?Subjective: Patient was seen and examined at bedside.  Overnight events noted. ?Patient is much improved.  He is alert oriented x 3 following full commands. ?Work-up so far has been unremarkable. ? ?Physical Exam: ?Vitals:  ? 12/04/21 1000 12/04/21 1100 12/04/21 1200 12/04/21 1300  ?BP: 115/69 (!) 104/50 (!) 115/55 117/65  ?Pulse: 66 62 66 66  ?Resp: '13 15 16 17  '$ ?Temp:      ?TempSrc:      ?SpO2: 100% 99% 100% 97%  ? ? ?General exam: Appears comfortable, not in any acute distress.  Deconditioned ?Respiratory system: CTA bilaterally, no wheezing, no crackles, normal respiratory effort. ?Cardiovascular system: S1-S2 heard, regular rate and rhythm, no murmur. ?Gastrointestinal system: Abdomen is soft, nontender, nondistended, BS+ ?Central nervous system: Alert, oriented x 3, no focal neurological deficits. ?Extremities: No edema, no cyanosis, no clubbing. ?Psychiatry: Mood, insight, judgment normal. ? ?Data Reviewed: ?I have Reviewed nursing notes, Vitals, and Lab results since pt's last encounter. Pertinent lab results CBC, BMP, UA ?I have ordered test including CBC, BMP ?I have independently visualized and interpreted imaging MRI which showed no acute abnormality noted. ?I have reviewed the last note from hospitalist,  ?I have discussed pt's care plan and test results with patient.  ? ?Family Communication: No family at bedside ? ?Disposition: ?Status is: Inpatient ?Remains inpatient appropriate because:  ? ?Admitted for confusion, altered mental status, possible syncopal episode, work-up so far has been unremarkable. ? ?Anticipated discharge home in 1 to 2 days. ? ? Planned Discharge Destination: Home ? ? ? ? ?Time spent:  50 minutes ? ?Author: ?Shawna Clamp, MD ?12/04/2021 1:57 PM ? ?For on call review [email or web address].  ?

## 2021-12-04 NOTE — Assessment & Plan Note (Addendum)
Continue pantoprazole. °

## 2021-12-04 NOTE — ED Notes (Signed)
Secure msg sent to Raj Janus, RN for ED to IP SBAR. ?

## 2021-12-04 NOTE — ED Notes (Signed)
Patient resting in bed with eyes closed. Respirations even and unlabored. NAD noted.  

## 2021-12-04 NOTE — Assessment & Plan Note (Addendum)
Hold blood pressure medications and blood pressure is on the lower side. ?

## 2021-12-05 LAB — URINE CULTURE: Culture: NO GROWTH

## 2021-12-05 LAB — PROCALCITONIN: Procalcitonin: <0.1 ng/mL

## 2021-12-05 LAB — GLUCOSE, CAPILLARY
Glucose-Capillary: 135 mg/dL — ABNORMAL HIGH (ref 70–99)
Glucose-Capillary: 177 mg/dL — ABNORMAL HIGH (ref 70–99)
Glucose-Capillary: 178 mg/dL — ABNORMAL HIGH (ref 70–99)

## 2021-12-05 NOTE — Plan of Care (Signed)

## 2021-12-05 NOTE — Discharge Summary (Signed)
?Physician Discharge Summary ?  ?Patient: Patrick Elliott MRN: [id number] DOB: [birth year]  ?Admit date:     12/03/2021  ?Discharge date: 12/05/21  ?Discharge Physician: Shawna Clamp  ? ?PCP: Lattie Haw, MD  ? ?Recommendations at discharge:  ?Advised to follow-up with primary care physician in 1 week. ?Advised to have echocardiogram as an outpatient. ?Patient has a complete work-up so far unremarkable. ? ? ?Discharge Diagnoses: ?Principal Problem: ?  AMS (altered mental status) ?Active Problems: ?  Diabetes mellitus with neuropathy ([place]) ?  Bipolar disorder ([place]) ?  HYPERTENSION, BENIGN ESSENTIAL ?  GERD ?  Chronic diastolic congestive heart failure ([place]) ?  Cirrhosis ([place]) ?  Ischemic colitis ([place]) ? ?Resolved Problems: ?  * No resolved hospital problems. * ? ?Hospital Course: ?This 61 years old male with PMH significant for liver cirrhosis, ischemic colitis s/p Hartman's procedure in 03/2017, diabetes mellitus type 2, bipolar, thrombocytopenia, false positive HIV test with negative RNA came in the ED with altered mentation, syncope and collapse.  Patient was found confused, patient left his friend a voice message, she could not understand what he was saying.  She also reported patient was having hallucinations.  She went to pick him up and patient had a laceration on the right forehead and had a fall seems like he has passed out briefly.  Patient was confused and could not dress himself so she called 911 and patient was brought in the ED.  Patient is admitted for altered mental status.  Work-up so far unremarkable.  CT head, MRI unremarkable.  UA normal all blood work normal.  Urine drug screen positive for amphetamines and tricyclic's.  Patient back to his baseline mental status patient has been ambulatory, denies any other symptoms.  Patient feels better want to be discharged.  Patient is being discharged home ? ?Assessment and Plan: ?* AMS (altered mental status) ?Patient presented with encephalopathy as  evidenced by somnolence. ?Patient was nonresponsive on arrival.  Work-up so far unremarkable. ?Suspicion was drug ingestion but there is no other evidence of infection at this time. ?Other differentials include metabolic encephalopathy, TIA/CVA. ?Obtain lithium and Lamictal levels. ?CT head unremarkable, MRI no acute abnormality noted. ?UA unremarkable, other labs unremarkable. ?CTA chest, abdomen pelvis no acute abnormality noted.: ?Patient has improved.  He is alert oriented following full commands. ?Urine drug screen positive for amphetamines and tricyclics. ?Encephalopathy resolved.  Patient wants to be discharged. ? ?Ischemic colitis ([place]) ?Lactic acid 1.4. ?CTA chest, abdomen pelvis no acute abnormality noted. ? ?Cirrhosis ([place]) ?Monitor liver function. ? ? ?Chronic diastolic congestive heart failure ([place]) ?Patient does not appear volume overloaded. ?Patient does not want to wait for echocardiogram.  Echocardiogram can be done outpatient.  Patient is being discharged ? ? ?GERD ?Continue pantoprazole. ? ?HYPERTENSION, BENIGN ESSENTIAL ?Hold blood pressure medications and blood pressure is on the lower side. ? ?Bipolar disorder ([place]) ?Resume home medications. ? ?Diabetes mellitus with neuropathy ([place]) ?Hold metformin. ?Start regular insulin sliding scale. ? ? ? ? ?Pain control - [employer] Controlled Substance Reporting System database was reviewed. and patient was instructed, not to drive, operate heavy machinery, perform activities at heights, swimming or participation in water activities or provide baby-sitting services while on Pain, Sleep and Anxiety Medications; until their outpatient Physician has advised to do so again. Also recommended to not to take more than prescribed Pain, Sleep and Anxiety Medications.  ? ?Consultants: None ? ?Procedures performed: CT head, MRI ? ?Disposition: Home ? ?Diet recommendation:  ?Discharge Diet Orders (  From admission, onward)  ? ?  Start     Ordered  ? 12/05/21 0000   Diet - low sodium heart healthy       ? 12/05/21 1009  ? 12/05/21 0000  Diet Carb Modified       ? 12/05/21 1009  ? ?  ?  ? ?  ? ?Carb modified diet ?DISCHARGE MEDICATION: ?Allergies as of 12/05/2021   ? ?   Reactions  ? Penicillins Nausea Only  ? Occurred with amoxicillin  ?Tolerated cephalosporins before  ? Sulfamethoxazole Nausea Only, Rash  ? ?  ? ?  ?Medication List  ?  ? ?STOP taking these medications   ? ?amoxicillin-clavulanate 875-125 MG tablet ?Commonly known as: AUGMENTIN ?  ?carvedilol 6.25 MG tablet ?Commonly known as: COREG ?  ?tadalafil 20 MG tablet ?Commonly known as: CIALIS ?  ? ?  ? ?TAKE these medications   ? ?Accu-Chek [employer] Kit ?As directed up to 4 times a day ?  ?Accu-Chek FastClix Lancets Misc ?USE AS DIRECTED UP TO 5 TIMES A DAY ?  ?Accu-Chek Guide test strip ?Generic drug: glucose blood ?USE AS INSTRUCTED 5 TIMES DAILY ?  ?BD Pen Needle Nano 2nd Gen 32G X 4 MM Misc ?Generic drug: Insulin Pen Needle ?USE AS DIRECTED TWICE A DAY WITH BYETTA ?  ?Blood Glucose Monitoring Suppl Kit ?1 each as directed by Does not apply route. ?  ?Botox 100 units Solr injection ?Generic drug: botulinum toxin Type A ?Inject 1 each into the skin as needed. ?  ?Dexcom G6 Sensor Misc ?USE 1 SENSOR EVERY 10 DAYS ?  ?Dexcom G6 Transmitter Misc ?USE AS DIRECTED ?  ?Fiasp FlexTouch 100 UNIT/ML FlexTouch Pen ?Generic drug: insulin aspart ?Inject 20-26 Units into the skin 2 (two) times daily before a meal. < 200 / < 15 gm:  20 units; 200-250 / 15 gm: 22 units; 200-250 / 30 gm: 24 units; > 250 / < 15 gm: 24 units; > 250 / > 15 gm: 26 units ?What changed: how much to take ?  ?Gvoke HypoPen 2-Pack 0.5 MG/0.1ML Soaj ?Generic drug: Glucagon ?INJECT 0.5 MG INTO THE SKIN AS NEEDED. ?  ?Jardiance 25 MG Tabs tablet ?Generic drug: empagliflozin ?TAKE 1 TABLET BY MOUTH EVERY DAY ?  ?lamoTRIgine 25 MG tablet ?Commonly known as: LAMICTAL ?TAKE 4 TABLETS (100 MG TOTAL) BY MOUTH DAILY. ?  ?Lantus SoloStar 100 UNIT/ML Solostar  Pen ?Generic drug: insulin glargine ?INJECT 24 UNITS INTO THE SKIN DAILY. ?What changed: how much to take ?  ?lisdexamfetamine 70 MG capsule ?Commonly known as: VYVANSE ?Take 1 capsule (70 mg total) by mouth daily. Per Dr. Tammi Klippel.  Scripts written for 2/21 and 3/21.  Due for refills 4/21. ?  ?lisinopril 20 MG tablet ?Commonly known as: ZESTRIL ?TAKE 1 TABLET BY MOUTH EVERY DAY ?  ?lithium carbonate 450 MG CR tablet ?Commonly known as: ESKALITH ?TAKE 1 TABLET BY MOUTH EVERY DAY WITH FOOD ?  ?metFORMIN 500 MG tablet ?Commonly known as: GLUCOPHAGE ?TAKE 1 TABLET BY MOUTH 2 TIMES DAILY WITH A MEAL. ?  ?methocarbamol 500 MG tablet ?Commonly known as: ROBAXIN ?TAKE 1 TABLET BY MOUTH EVERY 8 HOURS AS NEEDED FOR MUSCLE SPASM ?  ?methylphenidate 10 MG tablet ?Commonly known as: RITALIN ?Take 1 tablet (10 mg total) by mouth daily. ?  ?multivitamin capsule ?Take 1 capsule by mouth daily. ?  ?nortriptyline 50 MG capsule ?Commonly known as: PAMELOR ?Take 50 mg by mouth at bedtime. ?  ?omeprazole 20 MG  capsule ?Commonly known as: PRILOSEC ?TAKE 1 CAPSULE BY MOUTH EVERY DAY ?  ?pregabalin 100 MG capsule ?Commonly known as: LYRICA ?TAKE 1 CAPSULE BY MOUTH THREE TIMES A DAY ?  ?Restasis 0.05 % ophthalmic emulsion ?Generic drug: cycloSPORINE ?Place 1 drop into both eyes 2 (two) times daily. ?  ?simvastatin 20 MG tablet ?Commonly known as: ZOCOR ?TAKE 1 TABLET BY MOUTH EVERY DAY ?What changed: Another medication with the same name was removed. Continue taking this medication, and follow the directions you see here. ?  ?zolpidem 10 MG tablet ?Commonly known as: AMBIEN ?Take 1 tablet (10 mg total) by mouth daily. ?  ? ?  ? ? Follow-up Information   ? ? Lattie Haw, MD Follow up in 1 week(s).   ?Specialty: Family Medicine ?Contact information: ?[address] [place] [address] ?[phone] ? ? ?  ?  ? ?  ?  ? ?  ? ?Discharge Exam: ?Danley Danker Weights  ? 12/05/21 0424  ?Weight: 83 kg  ? ?General exam: Appears comfortable, not in any  acute distress.  Deconditioned ?Respiratory system: CTA bilaterally, no wheezing, no crackles, normal respiratory effort. ?Cardiovascular system: S1-S2 heard, regular rate and rhythm, no murmur. ?[employer]

## 2021-12-05 NOTE — Discharge Instructions (Signed)
Advised to follow-up with primary care physician in 1 week. ?Advised to have echocardiogram as an outpatient. ?Patient has a complete work-up so far unremarkable. ?

## 2021-12-06 ENCOUNTER — Telehealth: Payer: Self-pay

## 2021-12-06 DIAGNOSIS — F05 Delirium due to known physiological condition: Secondary | ICD-10-CM | POA: Diagnosis not present

## 2021-12-06 DIAGNOSIS — F3170 Bipolar disorder, currently in remission, most recent episode unspecified: Secondary | ICD-10-CM | POA: Diagnosis not present

## 2021-12-06 DIAGNOSIS — F909 Attention-deficit hyperactivity disorder, unspecified type: Secondary | ICD-10-CM | POA: Diagnosis not present

## 2021-12-06 LAB — LYME DISEASE SEROLOGY W/REFLEX: Lyme Total Antibody EIA: NEGATIVE

## 2021-12-06 LAB — LAMOTRIGINE LEVEL: Lamotrigine Lvl: 3.3 ug/mL (ref 2.0–20.0)

## 2021-12-06 NOTE — Telephone Encounter (Signed)
Transition Care Management Unsuccessful Follow-up Telephone Call ? ?Date of discharge and from where:  12/05/2021-[facility] ? ?Attempts:  1st Attempt ? ?Reason for unsuccessful TCM follow-up call:  Left voice message ? ?  ?

## 2021-12-07 NOTE — Telephone Encounter (Signed)
Transition Care Management Unsuccessful Follow-up Telephone Call ? ?Date of discharge and from where:  12/05/2021-[facility] ? ?Attempts:  2nd Attempt ? ?Reason for unsuccessful TCM follow-up call:  Left voice message ? ?  ?

## 2021-12-08 NOTE — Telephone Encounter (Signed)
Transition Care Management Unsuccessful Follow-up Telephone Call ? ?Date of discharge and from where:  12/05/2021-[facility] ? ?Attempts:  3rd Attempt ? ?Reason for unsuccessful TCM follow-up call:  Left voice message ? ?  ?

## 2021-12-09 LAB — CULTURE, BLOOD (ROUTINE X 2)
Culture: NO GROWTH
Culture: NO GROWTH

## 2021-12-16 DIAGNOSIS — M542 Cervicalgia: Secondary | ICD-10-CM | POA: Diagnosis not present

## 2021-12-16 DIAGNOSIS — M791 Myalgia, unspecified site: Secondary | ICD-10-CM | POA: Diagnosis not present

## 2021-12-16 DIAGNOSIS — G43719 Chronic migraine without aura, intractable, without status migrainosus: Secondary | ICD-10-CM | POA: Diagnosis not present

## 2021-12-16 DIAGNOSIS — G518 Other disorders of facial nerve: Secondary | ICD-10-CM | POA: Diagnosis not present

## 2021-12-21 DIAGNOSIS — Z933 Colostomy status: Secondary | ICD-10-CM | POA: Diagnosis not present

## 2021-12-23 DIAGNOSIS — E1140 Type 2 diabetes mellitus with diabetic neuropathy, unspecified: Secondary | ICD-10-CM | POA: Diagnosis not present

## 2021-12-23 DIAGNOSIS — R300 Dysuria: Secondary | ICD-10-CM | POA: Diagnosis not present

## 2021-12-23 DIAGNOSIS — F05 Delirium due to known physiological condition: Secondary | ICD-10-CM | POA: Insufficient documentation

## 2021-12-23 DIAGNOSIS — F909 Attention-deficit hyperactivity disorder, unspecified type: Secondary | ICD-10-CM | POA: Diagnosis not present

## 2021-12-23 DIAGNOSIS — F3170 Bipolar disorder, currently in remission, most recent episode unspecified: Secondary | ICD-10-CM | POA: Diagnosis not present

## 2021-12-23 DIAGNOSIS — R8290 Unspecified abnormal findings in urine: Secondary | ICD-10-CM | POA: Diagnosis not present

## 2021-12-23 DIAGNOSIS — Z794 Long term (current) use of insulin: Secondary | ICD-10-CM | POA: Diagnosis not present

## 2021-12-24 ENCOUNTER — Other Ambulatory Visit: Payer: Self-pay | Admitting: Family Medicine

## 2021-12-24 DIAGNOSIS — E1140 Type 2 diabetes mellitus with diabetic neuropathy, unspecified: Secondary | ICD-10-CM

## 2021-12-27 ENCOUNTER — Ambulatory Visit: Payer: Self-pay | Admitting: Surgery

## 2021-12-27 DIAGNOSIS — K559 Vascular disorder of intestine, unspecified: Secondary | ICD-10-CM | POA: Diagnosis not present

## 2021-12-27 DIAGNOSIS — Z933 Colostomy status: Secondary | ICD-10-CM | POA: Diagnosis not present

## 2021-12-27 DIAGNOSIS — F3170 Bipolar disorder, currently in remission, most recent episode unspecified: Secondary | ICD-10-CM | POA: Insufficient documentation

## 2021-12-27 DIAGNOSIS — K7460 Unspecified cirrhosis of liver: Secondary | ICD-10-CM

## 2021-12-27 NOTE — H&P (Addendum)
Subjective: ? ?CC: Colostomy in place (CMS-HCC) [Z93.3] ? ?HPI: ? Patrick Elliott is a 61 y.o. male who returns for evaluation of above. No issues since last visit except for growing up around ostomy and in general abdomen  ?  ?Past Medical History: includes, DM, CHF, HTN, cirrhosis, thrombocytopenia, ischemic colitis ? ?Past Surgical History: hartman's for ischemic colitis ? ?Family History: reviewed and not relevant to CC ? ?Social History:  reports that he has quit smoking. He has never used smokeless tobacco. No history on file for alcohol use and drug use. ? ?Current Medications: has a current medication list which includes the following prescription(s): carvedilol, empagliflozin, gvoke hypopen 2-pack, fiasp flextouch u-100 insulin, insulin glargine, lamotrigine, lisinopril, lithium carbonate, metformin, methocarbamol, multivitamin, nitrofurantoin (macrocrystal-monohydrate), omeprazole, botox, pregabalin, restasis, simvastatin, zolpidem, dexcom g6 sensor, lisdexamfetamine, methylphenidate hcl, metronidazole, neomycin, and nortriptyline. ? ?Allergies:  ?Allergies ?Allergen Reactions ? Penicillins Other (See Comments) ?  Other Reaction: Not Assessed ? Sulfa (Sulfonamide Antibiotics) Nausea and Other (See Comments) ?  Other Reaction: Not Assessed ? ? ?ROS:  ?A 15 point review of systems was performed and pertinent positives and negatives noted in HPI ?  ?Objective: ?  ? ?BP (!) 169/82   Pulse 86   Ht 170.2 cm ('5\' 7"'$ ) Comment: Per chart  Wt 78 kg (172 lb) Comment: Per chart  BMI 26.94 kg/m?  ? ?Constitutional :  No distress, cooperative, alert ?Lymphatics/Throat:  Supple with no lymphadenopathy ?Respiratory:  Clear to auscultation bilaterally ?Cardiovascular:  Regular rate and rhythm ?Gastrointestinal: Soft, non-tender, non-distended, no organomegaly. Ostomy bag intact.  Obvious large parastomal hernia, with diastasis noted on exam as well, creating very protuberant abdomen. ?Musculoskeletal: Steady gait and  movement ?Skin: Cool and moist, midline surgical scars ?Psychiatric: Normal affect, non-agitated, not confused ?  ?  ?LABS:  ?n/a  ? ? ?RADS: ?CLINICAL DATA:  Altered mental status, syncope, hypoxia, aortic  ?dissection  ? ?EXAM:  ?CT ANGIOGRAPHY CHEST, ABDOMEN AND PELVIS  ? ?TECHNIQUE:  ?Non-contrast CT of the chest was initially obtained.  ? ?Multidetector CT imaging through the chest, abdomen and pelvis was  ?performed using the standard protocol during bolus administration of  ?intravenous contrast. Multiplanar reconstructed images and MIPs were  ?obtained and reviewed to evaluate the vascular anatomy.  ? ?RADIATION DOSE REDUCTION: This exam was performed according to the  ?departmental dose-optimization program which includes automated  ?exposure control, adjustment of the mA and/or kV according to  ?patient size and/or use of iterative reconstruction technique.  ? ?CONTRAST:  152m OMNIPAQUE IOHEXOL 350 MG/ML SOLN  ? ?COMPARISON:  CT abdomen pelvis 04/08/2021  ? ?FINDINGS:  ?CTA CHEST FINDINGS  ? ?Cardiovascular: Extensive multi-vessel coronary artery  ?calcification. Cardiac size within normal limits. No pericardial  ?effusion. Central pulmonary arteries are of normal caliber. Thoracic  ?aorta is of normal caliber. No intramural hematoma, dissection, or  ?aneurysm. Mild atherosclerotic calcification within the descending  ?thoracic aorta. Arch vasculature demonstrates bovine arch anatomy  ?with wide patency proximally.  ? ?Mediastinum/Nodes: No enlarged mediastinal, hilar, or axillary lymph  ?nodes. Thyroid gland, trachea, and esophagus demonstrate no  ?significant findings.  ? ?Lungs/Pleura: Bilateral dependent atelectasis. Lungs are otherwise  ?clear. No pneumothorax or pleural effusion. Central airways are  ?widely patent.  ? ?Musculoskeletal: No chest wall abnormality. No acute or significant  ?osseous findings.  ? ?Review of the MIP images confirms the above findings.  ? ?CTA ABDOMEN AND PELVIS FINDINGS   ? ?VASCULAR  ? ?Aorta: Normal caliber. No aneurysm or dissection.  No periaortic  ?inflammatory change. Mild atherosclerotic calcification.  ? ?Celiac: Patent without evidence of aneurysm, dissection, vasculitis  ?or significant stenosis.  ? ?SMA: Patent without evidence of aneurysm, dissection, vasculitis or  ?significant stenosis.  ? ?Renals: Both renal arteries are patent without evidence of aneurysm,  ?dissection, vasculitis, fibromuscular dysplasia or significant  ?stenosis.  ? ?IMA: Patent without evidence of aneurysm, dissection, vasculitis or  ?significant stenosis.  ? ?Inflow: Patent without evidence of aneurysm, dissection, vasculitis  ?or significant stenosis.  ? ?Veins: No obvious venous abnormality within the limitations of this  ?arterial phase study. Note is made of a recanalized umbilical vein  ?in keeping with portal venous hypertension.  ? ?Review of the MIP images confirms the above findings.  ? ?NON-VASCULAR  ? ?Hepatobiliary: Cholelithiasis without pericholecystic inflammatory  ?change. The liver contour is nodular in keeping with cirrhosis. No  ?focal intrahepatic mass identified, though imaging is slightly  ?limited by relatively early phase of enhancement. No intra or  ?extrahepatic biliary ductal dilation.  ? ?Pancreas: Unremarkable  ? ?Spleen: Moderate splenomegaly with the spleen measuring up to 16.5  ?cm in greatest dimension. Stable probable cyst within the interpolar  ?region.  ? ?Adrenals/Urinary Tract: Adrenal glands are unremarkable. Kidneys are  ?normal, without renal calculi, focal lesion, or hydronephrosis.  ?Bladder is unremarkable save for moderate distension.  ? ?Stomach/Bowel: Status post descending colostomy and Hartmann pouch  ?formation. Stomach, small bowel, and large bowel are otherwise  ?unremarkable. Appendix absent. Moderate left lower quadrant fat  ?containing parastomal hernia. No free intraperitoneal gas or fluid.  ? ?Lymphatic: No pathologic adenopathy within the  abdomen and pelvis.  ? ?Reproductive: Prostate is unremarkable.  ? ?Other: Diastasis of the rectus abdominus musculature noted with  ?protuberance within the umbilical region without frank hernia  ?identified.  ? ?Musculoskeletal: No acute bone abnormality. No lytic or blastic bone  ?lesion.  ? ?Review of the MIP images confirms the above findings.  ? ?IMPRESSION:  ?No acute intrathoracic or intra-abdominal pathology identified. No  ?thoracoabdominal aortic aneurysm or dissection identified.  ? ?Extensive coronary artery calcification.  ? ?Cirrhosis. No definite focal intrahepatic mass on this limited  ?examination.  ? 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  ? ?Status post descending colostomy and Hartmann pouch formation.  ?Moderate parastomal hernia without evidence of obstruction.  ? ?Aortic Atherosclerosis (ICD10-I70.0).  ? ? ?Electronically Signed  ?  By: Fidela Salisbury M.D.  ?  On: 12/04/2021 00:53 ?Procedure Note ? ?Charlaine Dalton, MD - 12/04/2021  ?Formatting of this note might be different from the original.  ?CLINICAL DATA:  Altered mental status, syncope, hypoxia, aortic  ?dissection  ? ?EXAM:  ?CT ANGIOGRAPHY CHEST, ABDOMEN AND PELVIS  ? ?TECHNIQUE:  ?Non-contrast CT of the chest was initially obtained.  ? ?Multidetector CT imaging through the chest, abdomen and pelvis was  ?performed using the standard protocol during bolus administration of  ?intravenous contrast. Multiplanar reconstructed images and MIPs were  ?obtained and reviewed to evaluate the vascular anatomy.  ? ?RADIATION DOSE REDUCTION: This exam was performed according to the  ?departmental dose-optimization program which includes automated  ?exposure control, adjustment of the mA and/or kV according to  ?patient size and/or use of iterative reconstruction technique.  ? ?CONTRAST:  147m OMNIPAQUE IOHEXOL 350 MG/ML SOLN  ? ?COMPARISON:  CT abdomen pelvis 04/08/2021   ? ?FINDINGS:  ?CTA CHEST FINDINGS  ? ?Cardiovascular: Extensive multi-vessel coronary artery  ?calcification. Cardiac size  within normal limits. No pericardial  ?effusion. Central pulmonary arteries are of normal c

## 2021-12-28 ENCOUNTER — Encounter: Payer: Self-pay | Admitting: Podiatry

## 2021-12-28 ENCOUNTER — Ambulatory Visit: Payer: Medicaid Other | Admitting: Podiatry

## 2021-12-28 DIAGNOSIS — M79675 Pain in left toe(s): Secondary | ICD-10-CM

## 2021-12-28 DIAGNOSIS — M79674 Pain in right toe(s): Secondary | ICD-10-CM

## 2021-12-28 DIAGNOSIS — B351 Tinea unguium: Secondary | ICD-10-CM | POA: Diagnosis not present

## 2021-12-28 NOTE — Progress Notes (Signed)
? ?  SUBJECTIVE ?Patient presents to office today complaining of elongated, thickened nails that cause pain while ambulating in shoes.  Patient is unable to trim their own nails. Patient is here for further evaluation and treatment. ? ?Past Medical History:  ?Diagnosis Date  ? Anemia   ? CHF (congestive heart failure) ([place])   ? DIASTOLIC  ? Cirrhosis ([place])   ? Diabetes mellitus   ? Dizziness   ? Hypertension   ? Syncope and collapse   ? Tachycardia   ? ? ?OBJECTIVE ?General Patient is awake, alert, and oriented x 3 and in no acute distress. ?Derm Skin is dry and supple bilateral. Negative open lesions or macerations. Remaining integument unremarkable. Nails are tender, long, thickened and dystrophic with subungual debris, consistent with onychomycosis, 1-5 bilateral. No signs of infection noted. ?Vasc  DP and PT pedal pulses palpable bilaterally. Temperature gradient within normal limits.  ?Neuro Epicritic and protective threshold sensation grossly intact bilaterally.  ?Musculoskeletal Exam No symptomatic pedal deformities noted bilateral. Muscular strength within normal limits. ? ?ASSESSMENT ?1.  Pain due to onychomycosis of toenails both ? ?PLAN OF CARE ?1. Patient evaluated today.  ?2. Instructed to maintain good pedal hygiene and foot care.  ?3. Mechanical debridement of nails 1-5 bilaterally performed using a nail nipper. Filed with dremel without incident.  ?4. Return to clinic in 3 mos.  ? ? ?Edrick Kins, DPM ?[place] ? ?Dr. Edrick Kins, DPM  ?  ?[address]                                     ?[place], [place] [address]                ?Office [phone]  ?Fax [phone] ? ? ? ? ?

## 2022-01-07 ENCOUNTER — Telehealth: Payer: Self-pay | Admitting: Pharmacist

## 2022-01-07 NOTE — Telephone Encounter (Signed)
Phone call from patient 5/12 and 5/18  I was unable to return call on medication updates until 5/19  Patient reported recent hospitalization will hallucinations.  Reported that Dr. Tammi Klippel has been managing his medications and stopped, methylphenidate, nortriptyline.   Patient was started on olanzapine (Zyprexa) in place of those two medications.  He stated that he has some groggy feeling in the AM but overall he feels like he is sleeping better.   Also shared that simvastatin was increased from 20 to '40mg'$  once daily.   Glucose control was been similar to previous.    He states he had more alarms recently.  He has transitioned his PCP to Dr. Tammi Klippel.   I shared with him that I would be open to answering questions for him in the future.  Additionally, I would also be wiling to share conversation with Dr. Tammi Klippel PRN.   Medication updates made in chart.

## 2022-01-10 NOTE — Telephone Encounter (Signed)
Noted and agree. 

## 2022-01-12 ENCOUNTER — Other Ambulatory Visit: Payer: Self-pay | Admitting: Family Medicine

## 2022-01-17 ENCOUNTER — Other Ambulatory Visit: Payer: Self-pay | Admitting: Family Medicine

## 2022-01-17 DIAGNOSIS — Z794 Long term (current) use of insulin: Secondary | ICD-10-CM

## 2022-01-21 ENCOUNTER — Encounter
Admission: RE | Admit: 2022-01-21 | Discharge: 2022-01-21 | Disposition: A | Discharge status: Home / Self Care | Payer: Medicaid Other | Source: Ambulatory Visit | Attending: Surgery | Admitting: Surgery

## 2022-01-21 VITALS — Ht 67.0 in | Wt 176.0 lb

## 2022-01-21 DIAGNOSIS — E1140 Type 2 diabetes mellitus with diabetic neuropathy, unspecified: Secondary | ICD-10-CM

## 2022-01-21 HISTORY — DX: Cardiac murmur, unspecified: R01.1

## 2022-01-21 HISTORY — DX: Anxiety disorder, unspecified: F41.9

## 2022-01-21 HISTORY — DX: Depression, unspecified: F32.A

## 2022-01-21 HISTORY — DX: Myoneural disorder, unspecified: G70.9

## 2022-01-21 NOTE — Patient Instructions (Addendum)
Your procedure is scheduled on: Tuesday February 01, 2022. Report to Day Surgery inside [facility] 2nd floor, stop by admissions desk before getting on elevator. To find out your arrival time please call [phone] between 1PM - 3PM on Monday January 31, 2022.  Remember: Instructions that are not followed completely may result in serious medical risk,  up to and including death, or upon the discretion of your surgeon and anesthesiologist your  surgery may need to be rescheduled.     _X__ 1. Do not eat food after midnight the night before your procedure.                 No chewing gum or hard candies. You may drink clear liquids up to 2 hours                 before you are scheduled to arrive for your surgery- DO not drink clear                 liquids within 2 hours of the start of your surgery.                 Clear Liquids include:  water,  (Do not add                 anything to coffee or tea).  __X__2.  On the morning of surgery brush your teeth with toothpaste and water, you                may rinse your mouth with mouthwash if you wish.  Do not swallow any toothpaste or mouthwash.     _X__ 3.  No Alcohol for 24 hours before or after surgery.   _X__ 4.  Do Not Smoke or use e-cigarettes For 24 Hours Prior to Your Surgery.                 Do not use any chewable tobacco products for at least 6 hours prior to                 Surgery.  _X__  5.  Do not use any recreational drugs (marijuana, cocaine, heroin, ecstasy, MDMA or other)                For at least one week prior to your surgery.  Combination of these drugs with anesthesia                May have life threatening results.  ____  6.  Bring all medications with you on the day of surgery if instructed.   __X__  7.  Notify your doctor if there is any change in your medical condition      (cold, fever, infections).     Do not wear jewelry, make-up, hairpins, clips or nail polish. Do not wear lotions,  powders, or perfumes. You may wear deodorant. Do not shave 48 hours prior to surgery. Men may shave face and neck. Do not bring valuables to the hospital.    [facility] is not responsible for any belongings or valuables.  Contacts, dentures or bridgework may not be worn into surgery. Leave your suitcase in the car. After surgery it may be brought to your room. For patients admitted to the hospital, discharge time is determined by your treatment team.   Patients discharged the day of surgery will not be allowed to drive home.   Make arrangements for someone to be with you for the first  24 hours of your Same Day Discharge.   __X__ Take these medicines the morning of surgery with A SIP OF WATER:    1. lamoTRIgine (LAMICTAL) 25 MG   2. omeprazole (PRILOSEC) 20 MG   3. pregabalin (LYRICA) 100 MG   4.  5.  6.  ____ Fleet Enema (as directed)   __X__ Use CHG Soap (or wipes) as directed  ____ Use Benzoyl Peroxide Gel as instructed  ____ Use inhalers on the day of surgery  __x__ Stop metformin 2 days prior to surgery (take last dose 01/29/22)  __X__ Stop JARDIANCE 25 MG 3 days prior to surgery (take last dose 01/28/22)   __X__ Take 1/2 of usual insulin dose the night before surgery. NO insulin the morning          of surgery. insulin glargine (LANTUS SOLOSTAR) 100 UNIT/ML Solostar  ____ Call your PCP, cardiologist, or Pulmonologist if taking Coumadin/Plavix/aspirin and ask when to stop before your surgery.   __X__ One Week prior to surgery- Stop Anti-inflammatories such as Ibuprofen, Aleve, Advil, Motrin, meloxicam (MOBIC), diclofenac, etodolac, ketorolac, Toradol, Daypro, piroxicam, Goody's or BC powders. OK TO USE TYLENOL IF NEEDED   __X__ Do not start any vitamins and or supplements until after surgery. Multiple Vitamin (MULTIVITAMIN) capsule   ____ Bring C-Pap to the hospital.    If you have any questions regarding your pre-procedure instructions,  Please call Pre-admit  Testing at [phone]

## 2022-01-23 ENCOUNTER — Other Ambulatory Visit: Payer: Self-pay | Admitting: Family Medicine

## 2022-01-24 DIAGNOSIS — F5101 Primary insomnia: Secondary | ICD-10-CM | POA: Diagnosis not present

## 2022-01-24 DIAGNOSIS — F3170 Bipolar disorder, currently in remission, most recent episode unspecified: Secondary | ICD-10-CM | POA: Diagnosis not present

## 2022-01-25 ENCOUNTER — Encounter
Admission: RE | Admit: 2022-01-25 | Discharge: 2022-01-25 | Disposition: A | Discharge status: Home / Self Care | Payer: Medicaid Other | Source: Ambulatory Visit | Attending: Surgery | Admitting: Surgery

## 2022-01-25 DIAGNOSIS — K7460 Unspecified cirrhosis of liver: Secondary | ICD-10-CM | POA: Insufficient documentation

## 2022-01-25 DIAGNOSIS — Z933 Colostomy status: Secondary | ICD-10-CM | POA: Diagnosis not present

## 2022-01-25 DIAGNOSIS — Z01812 Encounter for preprocedural laboratory examination: Secondary | ICD-10-CM | POA: Insufficient documentation

## 2022-01-25 LAB — CBC WITH DIFFERENTIAL/PLATELET
Abs Immature Granulocytes: 0.01 10*3/uL (ref 0.00–0.07)
Basophils Absolute: 0 10*3/uL (ref 0.0–0.1)
Basophils Relative: 0 %
Eosinophils Absolute: 0.2 10*3/uL (ref 0.0–0.5)
Eosinophils Relative: 5 %
HCT: 39.9 % (ref 39.0–52.0)
Hemoglobin: 13.2 g/dL (ref 13.0–17.0)
Immature Granulocytes: 0 %
Lymphocytes Relative: 26 %
Lymphs Abs: 1.2 10*3/uL (ref 0.7–4.0)
MCH: 26.8 pg (ref 26.0–34.0)
MCHC: 33.1 g/dL (ref 30.0–36.0)
MCV: 81.1 fL (ref 80.0–100.0)
Monocytes Absolute: 0.4 10*3/uL (ref 0.1–1.0)
Monocytes Relative: 8 %
Neutro Abs: 2.7 10*3/uL (ref 1.7–7.7)
Neutrophils Relative %: 61 %
Platelets: 104 10*3/uL — ABNORMAL LOW (ref 150–400)
RBC: 4.92 MIL/uL (ref 4.22–5.81)
RDW: 14.5 % (ref 11.5–15.5)
WBC: 4.5 10*3/uL (ref 4.0–10.5)
nRBC: 0 % (ref 0.0–0.2)

## 2022-01-25 LAB — PROTIME-INR
INR: 1.3 — ABNORMAL HIGH (ref 0.8–1.2)
Prothrombin Time: 15.6 seconds — ABNORMAL HIGH (ref 11.4–15.2)

## 2022-01-25 LAB — APTT: aPTT: 30 seconds (ref 24–36)

## 2022-01-26 ENCOUNTER — Encounter: Payer: Self-pay | Admitting: Surgery

## 2022-01-26 DIAGNOSIS — G518 Other disorders of facial nerve: Secondary | ICD-10-CM | POA: Diagnosis not present

## 2022-01-26 DIAGNOSIS — M542 Cervicalgia: Secondary | ICD-10-CM | POA: Diagnosis not present

## 2022-01-26 DIAGNOSIS — M791 Myalgia, unspecified site: Secondary | ICD-10-CM | POA: Diagnosis not present

## 2022-01-26 DIAGNOSIS — G43719 Chronic migraine without aura, intractable, without status migrainosus: Secondary | ICD-10-CM | POA: Diagnosis not present

## 2022-01-27 ENCOUNTER — Ambulatory Visit
Admission: RE | Admit: 2022-01-27 | Discharge: 2022-01-27 | Disposition: A | Discharge status: Home / Self Care | Payer: Medicaid Other | Attending: Surgery | Admitting: Surgery

## 2022-01-27 ENCOUNTER — Ambulatory Visit: Payer: Medicaid Other | Admitting: Urgent Care

## 2022-01-27 ENCOUNTER — Encounter
Admission: RE | Disposition: A | Discharge status: Home / Self Care | Payer: Self-pay | Source: Home / Self Care | Attending: Surgery

## 2022-01-27 DIAGNOSIS — Z87891 Personal history of nicotine dependence: Secondary | ICD-10-CM | POA: Insufficient documentation

## 2022-01-27 DIAGNOSIS — K55039 Acute (reversible) ischemia of large intestine, extent unspecified: Secondary | ICD-10-CM | POA: Diagnosis not present

## 2022-01-27 DIAGNOSIS — Z933 Colostomy status: Secondary | ICD-10-CM | POA: Diagnosis not present

## 2022-01-27 DIAGNOSIS — I110 Hypertensive heart disease with heart failure: Secondary | ICD-10-CM | POA: Diagnosis not present

## 2022-01-27 DIAGNOSIS — I509 Heart failure, unspecified: Secondary | ICD-10-CM | POA: Diagnosis not present

## 2022-01-27 DIAGNOSIS — D696 Thrombocytopenia, unspecified: Secondary | ICD-10-CM | POA: Insufficient documentation

## 2022-01-27 DIAGNOSIS — K7460 Unspecified cirrhosis of liver: Secondary | ICD-10-CM | POA: Diagnosis not present

## 2022-01-27 DIAGNOSIS — F419 Anxiety disorder, unspecified: Secondary | ICD-10-CM | POA: Diagnosis not present

## 2022-01-27 DIAGNOSIS — E1165 Type 2 diabetes mellitus with hyperglycemia: Secondary | ICD-10-CM | POA: Diagnosis not present

## 2022-01-27 DIAGNOSIS — K559 Vascular disorder of intestine, unspecified: Secondary | ICD-10-CM | POA: Diagnosis not present

## 2022-01-27 DIAGNOSIS — D123 Benign neoplasm of transverse colon: Secondary | ICD-10-CM | POA: Insufficient documentation

## 2022-01-27 HISTORY — PX: COLONOSCOPY WITH PROPOFOL: SHX5780

## 2022-01-27 LAB — GLUCOSE, CAPILLARY: Glucose-Capillary: 273 mg/dL — ABNORMAL HIGH (ref 70–99)

## 2022-01-27 SURGERY — COLONOSCOPY WITH PROPOFOL
Anesthesia: General

## 2022-01-27 MED ORDER — PROPOFOL 500 MG/50ML IV EMUL
INTRAVENOUS | Status: DC | PRN
  Administered 2022-01-27: 150 ug/kg/min via INTRAVENOUS

## 2022-01-27 MED ORDER — PROPOFOL 10 MG/ML IV BOLUS
INTRAVENOUS | Status: DC | PRN
  Administered 2022-01-27: 30 mg via INTRAVENOUS
  Administered 2022-01-27: 70 mg via INTRAVENOUS

## 2022-01-27 MED ORDER — LIDOCAINE HCL (CARDIAC) PF 100 MG/5ML IV SOSY
PREFILLED_SYRINGE | INTRAVENOUS | Status: DC | PRN
  Administered 2022-01-27: 50 mg via INTRAVENOUS

## 2022-01-27 MED ORDER — SODIUM CHLORIDE 0.9 % IV SOLN: INTRAVENOUS | Status: DC

## 2022-01-27 NOTE — Transfer of Care (Signed)
Immediate Anesthesia Transfer of Care Note  Patient: Patrick Elliott  Procedure(s) Performed: COLONOSCOPY WITH PROPOFOL  Patient Location: PACU  Anesthesia Type: General  Level of Consciousness: awake, alert  and oriented  Airway & Oxygen Therapy: Patient Spontanous Breathing and Patient connected to nasal cannula oxygen  Post-op Assessment: Report given to RN and Post -op Vital signs reviewed and stable  Post vital signs: Reviewed and stable  Last Vitals:  Vitals Value Taken Time  BP 124/76 01/27/22 1147  Temp    Pulse 72 01/27/22 1147  Resp 15 01/27/22 1147  SpO2 100 % 01/27/22 1147    Last Pain:  Vitals:   01/27/22 1143  TempSrc:   PainSc: 0-No pain         Complications: No notable events documented.

## 2022-01-27 NOTE — H&P (Signed)
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 male who returns for evaluation of above. No issues since last visit except for growing up around ostomy and in general abdomen   Past Medical History: includes, DM, CHF, HTN, cirrhosis, thrombocytopenia, ischemic colitis  Past Surgical History: hartman's for ischemic colitis  Family History: reviewed and not relevant to CC  Social History: reports that he has quit smoking. He has never used smokeless tobacco. No history on file for alcohol use and drug use.  Current Medications: has a current medication list which includes the following prescription(s): carvedilol, empagliflozin, gvoke hypopen 2-pack, fiasp flextouch u-100 insulin, insulin glargine, lamotrigine, lisinopril, lithium carbonate, metformin, methocarbamol, multivitamin, nitrofurantoin (macrocrystal-monohydrate), omeprazole, botox, pregabalin, restasis, simvastatin, zolpidem, dexcom g6 sensor, lisdexamfetamine, methylphenidate hcl, metronidazole, neomycin, and nortriptyline.  Allergies:  Allergies  Allergen Reactions   Penicillins Other (See Comments)  Other Reaction: Not Assessed   Sulfa (Sulfonamide Antibiotics) Nausea and Other (See Comments)  Other Reaction: Not Assessed   ROS:  A 15 point review of systems was performed and pertinent positives and negatives noted in HPI  Objective:    BP (!) 169/82  Pulse 86  Ht 170.2 cm ('5\' 7"'$ ) Comment: Per chart  Wt 78 kg (172 lb) Comment: Per chart  BMI 26.94 kg/m   Constitutional : No distress, cooperative, alert  Lymphatics/Throat: Supple with no lymphadenopathy  Respiratory: Clear to auscultation bilaterally  Cardiovascular: Regular rate and rhythm  Gastrointestinal: Soft, non-tender, non-distended, no organomegaly. Ostomy bag intact. Obvious large parastomal hernia, with diastasis noted on exam as well, creating very protuberant abdomen.  Musculoskeletal: Steady gait and movement   Skin: Cool and moist, midline surgical scars  Psychiatric: Normal affect, non-agitated, not confused    LABS:  n/a   RADS: CLINICAL DATA:  Altered mental status, syncope, hypoxia, aortic  dissection   EXAM:  CT ANGIOGRAPHY CHEST, ABDOMEN AND PELVIS   TECHNIQUE:  Non-contrast CT of the chest was initially obtained.   Multidetector CT imaging through the chest, abdomen and pelvis was  performed using the standard protocol during bolus administration of  intravenous contrast. Multiplanar reconstructed images and MIPs were  obtained and reviewed to evaluate the vascular anatomy.   RADIATION DOSE REDUCTION: This exam was performed according to the  departmental dose-optimization program which includes automated  exposure control, adjustment of the mA and/or kV according to  patient size and/or use of iterative reconstruction technique.   CONTRAST:  176m OMNIPAQUE IOHEXOL 350 MG/ML SOLN   COMPARISON:  CT abdomen pelvis 04/08/2021   FINDINGS:  CTA CHEST FINDINGS   Cardiovascular: Extensive multi-vessel coronary artery  calcification. Cardiac size within normal limits. No pericardial  effusion. Central pulmonary arteries are of normal caliber. Thoracic  aorta is of normal caliber. No intramural hematoma, dissection, or  aneurysm. Mild atherosclerotic calcification within the descending  thoracic aorta. Arch vasculature demonstrates bovine arch anatomy  with wide patency proximally.   Mediastinum/Nodes: No enlarged mediastinal, hilar, or axillary lymph  nodes. Thyroid gland, trachea, and esophagus demonstrate no  significant findings.   Lungs/Pleura: Bilateral dependent atelectasis. Lungs are otherwise  clear. No pneumothorax or pleural effusion. Central airways are  widely patent.   Musculoskeletal: No chest wall abnormality. No acute or significant  osseous findings.   Review of the MIP images confirms the above findings.   CTA ABDOMEN AND PELVIS FINDINGS    VASCULAR   Aorta: Normal caliber. No aneurysm or dissection. No periaortic  inflammatory  change. Mild atherosclerotic calcification.   Celiac: Patent without evidence of aneurysm, dissection, vasculitis  or significant stenosis.   SMA: Patent without evidence of aneurysm, dissection, vasculitis or  significant stenosis.   Renals: Both renal arteries are patent without evidence of aneurysm,  dissection, vasculitis, fibromuscular dysplasia or significant  stenosis.   IMA: Patent without evidence of aneurysm, dissection, vasculitis or  significant stenosis.   Inflow: Patent without evidence of aneurysm, dissection, vasculitis  or significant stenosis.   Veins: No obvious venous abnormality within the limitations of this  arterial phase study. Note is made of a recanalized umbilical vein  in keeping with portal venous hypertension.   Review of the MIP images confirms the above findings.   NON-VASCULAR   Hepatobiliary: Cholelithiasis without pericholecystic inflammatory  change. The liver contour is nodular in keeping with cirrhosis. No  focal intrahepatic mass identified, though imaging is slightly  limited by relatively early phase of enhancement. No intra or  extrahepatic biliary ductal dilation.   Pancreas: Unremarkable   Spleen: Moderate splenomegaly with the spleen measuring up to 16.5  cm in greatest dimension. Stable probable cyst within the interpolar  region.   Adrenals/Urinary Tract: Adrenal glands are unremarkable. Kidneys are  normal, without renal calculi, focal lesion, or hydronephrosis.  Bladder is unremarkable save for moderate distension.   Stomach/Bowel: Status post descending colostomy and Hartmann pouch  formation. Stomach, small bowel, and large bowel are otherwise  unremarkable. Appendix absent. Moderate left lower quadrant fat  containing parastomal hernia. No free intraperitoneal gas or fluid.   Lymphatic: No pathologic adenopathy within the  abdomen and pelvis.   Reproductive: Prostate is unremarkable.   Other: Diastasis of the rectus abdominus musculature noted with  protuberance within the umbilical region without frank hernia  identified.   Musculoskeletal: No acute bone abnormality. No lytic or blastic bone  lesion.   Review of the MIP images confirms the above findings.   IMPRESSION:  No acute intrathoracic or intra-abdominal pathology identified. No  thoracoabdominal aortic aneurysm or dissection identified.   Extensive coronary artery calcification.   Cirrhosis. No definite focal intrahepatic mass on this limited  examination.   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   Status post descending colostomy and Hartmann pouch formation.  Moderate parastomal hernia without evidence of obstruction.   Aortic Atherosclerosis (ICD10-I70.0).   Electronically Signed    By: Fidela Salisbury M.D.    On: 12/04/2021 00:53 Procedure Note  Charlaine Dalton, MD - 12/04/2021  Formatting of this note might be different from the original.  CLINICAL DATA:  Altered mental status, syncope, hypoxia, aortic  dissection   EXAM:  CT ANGIOGRAPHY CHEST, ABDOMEN AND PELVIS   TECHNIQUE:  Non-contrast CT of the chest was initially obtained.   Multidetector CT imaging through the chest, abdomen and pelvis was  performed using the standard protocol during bolus administration of  intravenous contrast. Multiplanar reconstructed images and MIPs were  obtained and reviewed to evaluate the vascular anatomy.   RADIATION DOSE REDUCTION: This exam was performed according to the  departmental dose-optimization program which includes automated  exposure control, adjustment of the mA and/or kV according to  patient size and/or use of iterative reconstruction technique.   CONTRAST:  147m OMNIPAQUE IOHEXOL 350 MG/ML SOLN   COMPARISON:  CT abdomen pelvis 04/08/2021    FINDINGS:  CTA CHEST FINDINGS   Cardiovascular: Extensive multi-vessel coronary artery  calcification. Cardiac size within normal limits. No pericardial  effusion. Central pulmonary arteries are of normal caliber. Thoracic  aorta is of normal caliber. No intramural hematoma, dissection, or  aneurysm. Mild atherosclerotic calcification within the descending  thoracic aorta. Arch vasculature demonstrates bovine arch anatomy  with wide patency proximally.   Mediastinum/Nodes: No enlarged mediastinal, hilar, or axillary lymph  nodes. Thyroid gland, trachea, and esophagus demonstrate no  significant findings.   Lungs/Pleura: Bilateral dependent atelectasis. Lungs are otherwise  clear. No pneumothorax or pleural effusion. Central airways are  widely patent.   Musculoskeletal: No chest wall abnormality. No acute or significant  osseous findings.   Review of the MIP images confirms the above findings.   CTA ABDOMEN AND PELVIS FINDINGS   VASCULAR   Aorta: Normal caliber. No aneurysm or dissection. No periaortic  inflammatory change. Mild atherosclerotic calcification.   Celiac: Patent without evidence of aneurysm, dissection, vasculitis  or significant stenosis.   SMA: Patent without evidence of aneurysm, dissection, vasculitis or  significant stenosis.   Renals: Both renal arteries are patent without evidence of aneurysm,  dissection, vasculitis, fibromuscular dysplasia or significant  stenosis.   IMA: Patent without evidence of aneurysm, dissection, vasculitis or  significant stenosis.   Inflow: Patent without evidence of aneurysm, dissection, vasculitis  or significant stenosis.   Veins: No obvious venous abnormality within the limitations of this  arterial phase study. Note is made of a recanalized umbilical vein  in keeping with portal venous hypertension.   Review of the MIP images confirms the above findings.   NON-VASCULAR   Hepatobiliary: Cholelithiasis  without pericholecystic inflammatory  change. The liver contour is nodular in keeping with cirrhosis. No  focal intrahepatic mass identified, though imaging is slightly  limited by relatively early phase of enhancement. No intra or  extrahepatic biliary ductal dilation.   Pancreas: Unremarkable   Spleen: Moderate splenomegaly with the spleen measuring up to 16.5  cm in greatest dimension. Stable probable cyst within the interpolar  region.   Adrenals/Urinary Tract: Adrenal glands are unremarkable. Kidneys are  normal, without renal calculi, focal lesion, or hydronephrosis.  Bladder is unremarkable save for moderate distension.   Stomach/Bowel: Status post descending colostomy and Hartmann pouch  formation. Stomach, small bowel, and large bowel are otherwise  unremarkable. Appendix absent. Moderate left lower quadrant fat  containing parastomal hernia. No free intraperitoneal gas or fluid.   Lymphatic: No pathologic adenopathy within the abdomen and pelvis.   Reproductive: Prostate is unremarkable.   Other: Diastasis of the rectus abdominus musculature noted with  protuberance within the umbilical region without frank hernia  identified.   Musculoskeletal: No acute bone abnormality. No lytic or blastic bone  lesion.   Review of the MIP images confirms the above findings.   IMPRESSION:  No acute intrathoracic or intra-abdominal pathology identified. No  thoracoabdominal aortic aneurysm or dissection identified.   Extensive coronary artery calcification.   Cirrhosis. No definite focal intrahepatic mass on this limited  examination.   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   Status post descending colostomy and Hartmann pouch formation.  Moderate parastomal hernia without evidence of obstruction.   Aortic Atherosclerosis (ICD10-I70.0).   Electronically Signed    By: Fidela Salisbury M.D.    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 Explained to patient the diastasis cannot be  addressed at the time of surgery.  Plan:    Discussed pathophisiology. The risk of laparoscopic colon resection surgery includes, but not limited to, recurrence, bleeding, chronic pain, post-op infxn, post-op SBO or ileus, hernias, resection of bowel, re-anastamosis, possible ostomy placement and need for re-operation to address said risks. The risks of general anesthetic, if used, includes MI, CVA, sudden death or even reaction to anesthetic medications also discussed. Alternatives include continued observation. Benefits include possible symptom relief, preventing further decline in health and possible death.  Typical post-op recovery time of additional days in hospital for observation afterwards also discussed.  Prep ordered. Will proceed with ERAS protocol as well. Pending medical clearance and workup as noted above.  R/b/a of colonoscopy discussed. Risks include bleeding, perforation. Benefits include diagnostic, curative procedure if needed. Alternatives include continued observation. Pt verbalized understanding.  Thrombocytopenia and cirrhosis- check INR, plt count day of surgery.   The patient verbalized understanding and all questions were answered to the patient's satisfaction.  labs/images/medications/previous chart entries reviewed personally and relevant changes/updates noted above.

## 2022-01-27 NOTE — Anesthesia Postprocedure Evaluation (Signed)
Anesthesia Post Note  Patient: Patrick Elliott  Procedure(s) Performed: COLONOSCOPY WITH PROPOFOL  Patient location during evaluation: Endoscopy Anesthesia Type: General Level of consciousness: awake and alert Pain management: pain level controlled Vital Signs Assessment: post-procedure vital signs reviewed and stable Respiratory status: spontaneous breathing, nonlabored ventilation, respiratory function stable and patient connected to nasal cannula oxygen Cardiovascular status: blood pressure returned to baseline and stable Postop Assessment: no apparent nausea or vomiting Anesthetic complications: no   No notable events documented.   Last Vitals:  Vitals:   01/27/22 1153 01/27/22 1203  BP: (!) 147/84 (!) 152/84  Pulse: 70 68  Resp: 16 17  Temp:    SpO2: 100% 100%    Last Pain:  Vitals:   01/27/22 1203  TempSrc:   PainSc: 0-No pain                 Arita Miss

## 2022-01-27 NOTE — Op Note (Signed)
[facility] Patient Name: Patrick Elliott Procedure Date: 01/27/2022 10:24 AM MRN: [id number] Account #: [id number] Date of Birth: [birth year] Admit Type: Outpatient Age: 61 Room: [facility] ENDO ROOM 3 Gender: Male Note Status: Finalized Instrument Name: Colonscope [id number] Procedure:             Colonoscopy Indications:           Follow-up of acute ischemic colitis Providers:             Eliseo Squires MD, MD Referring MD:          No Local Md, MD (Referring MD) Medicines:             Propofol per Anesthesia Complications:         No immediate complications. Procedure:             Pre-Anesthesia Assessment:                        - After reviewing the risks and benefits, the patient                         was deemed in satisfactory condition to undergo the                         procedure in an ambulatory setting.                        After obtaining informed consent, the colonoscope was                         passed under direct vision. Throughout the procedure,                         the patient's blood pressure, pulse, and oxygen                         saturations were monitored continuously. The                         Colonoscope was introduced through the descending                         colostomy and advanced to the the cecum, identified by                         the ileocecal valve. After examination of colon                         through ostomy, scope inserted through rectum and                         reached the stump staple line. The colonoscopy was                         performed with ease. The patient tolerated the                         procedure well. The quality of the bowel preparation  was fair. Findings:      The perianal and digital rectal examinations were normal.      A 3 mm polyp was found in the proximal transverse colon. The polyp was       sessile. Biopsies were taken with a cold forceps for  histology.       Estimated blood loss was minimal.      The exam was otherwise without abnormality. Impression:            - Preparation of the colon was fair.                        - One 3 mm polyp in the proximal transverse colon.                         Biopsied.                        - The examination was otherwise normal. Recommendation:        - Discharge patient to home.                        - Resume previous diet.                        - Written discharge instructions were provided to the                         patient.                        - Await pathology results. Procedure Code(s):     --- Professional ---                        [phone], Colonoscopy through stoma; with biopsy, single                         or multiple                        45330, Sigmoidoscopy, flexible; diagnostic, including                         collection of specimen(s) by brushing or washing, when                         performed (separate procedure) Diagnosis Code(s):     --- Professional ---                        K63.5, Polyp of colon                        K55.039, Acute (reversible) ischemia of large                         intestine, extent unspecified CPT copyright 2019 [employer]. All rights reserved. The codes documented in this report are preliminary and upon coder review may  be revised to meet current compliance requirements. Dr. Sheppard Penton, MD Eliseo Squires MD, MD 01/27/2022 11:41:37 AM This report has been signed electronically. Number of Addenda: 0 Note Initiated  On: 01/27/2022 10:24 AM Scope Withdrawal Time: 0 hours 16 minutes 5 seconds  Total Procedure Duration: 0 hours 26 minutes 21 seconds  Estimated Blood Loss:  Estimated blood loss was minimal.      [facility]

## 2022-01-27 NOTE — H&P (View-Only) (Signed)
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 male who returns for evaluation of above. No issues since last visit except for growing up around ostomy and in general abdomen   Past Medical History: includes, DM, CHF, HTN, cirrhosis, thrombocytopenia, ischemic colitis  Past Surgical History: hartman's for ischemic colitis  Family History: reviewed and not relevant to CC  Social History: reports that he has quit smoking. He has never used smokeless tobacco. No history on file for alcohol use and drug use.  Current Medications: has a current medication list which includes the following prescription(s): carvedilol, empagliflozin, gvoke hypopen 2-pack, fiasp flextouch u-100 insulin, insulin glargine, lamotrigine, lisinopril, lithium carbonate, metformin, methocarbamol, multivitamin, nitrofurantoin (macrocrystal-monohydrate), omeprazole, botox, pregabalin, restasis, simvastatin, zolpidem, dexcom g6 sensor, lisdexamfetamine, methylphenidate hcl, metronidazole, neomycin, and nortriptyline.  Allergies:  Allergies  Allergen Reactions   Penicillins Other (See Comments)  Other Reaction: Not Assessed   Sulfa (Sulfonamide Antibiotics) Nausea and Other (See Comments)  Other Reaction: Not Assessed   ROS:  A 15 point review of systems was performed and pertinent positives and negatives noted in HPI  Objective:    BP (!) 169/82  Pulse 86  Ht 170.2 cm ('5\' 7"'$ ) Comment: Per chart  Wt 78 kg (172 lb) Comment: Per chart  BMI 26.94 kg/m   Constitutional : No distress, cooperative, alert  Lymphatics/Throat: Supple with no lymphadenopathy  Respiratory: Clear to auscultation bilaterally  Cardiovascular: Regular rate and rhythm  Gastrointestinal: Soft, non-tender, non-distended, no organomegaly. Ostomy bag intact. Obvious large parastomal hernia, with diastasis noted on exam as well, creating very protuberant abdomen.  Musculoskeletal: Steady gait and movement   Skin: Cool and moist, midline surgical scars  Psychiatric: Normal affect, non-agitated, not confused    LABS:  n/a   RADS: CLINICAL DATA:  Altered mental status, syncope, hypoxia, aortic  dissection   EXAM:  CT ANGIOGRAPHY CHEST, ABDOMEN AND PELVIS   TECHNIQUE:  Non-contrast CT of the chest was initially obtained.   Multidetector CT imaging through the chest, abdomen and pelvis was  performed using the standard protocol during bolus administration of  intravenous contrast. Multiplanar reconstructed images and MIPs were  obtained and reviewed to evaluate the vascular anatomy.   RADIATION DOSE REDUCTION: This exam was performed according to the  departmental dose-optimization program which includes automated  exposure control, adjustment of the mA and/or kV according to  patient size and/or use of iterative reconstruction technique.   CONTRAST:  158m OMNIPAQUE IOHEXOL 350 MG/ML SOLN   COMPARISON:  CT abdomen pelvis 04/08/2021   FINDINGS:  CTA CHEST FINDINGS   Cardiovascular: Extensive multi-vessel coronary artery  calcification. Cardiac size within normal limits. No pericardial  effusion. Central pulmonary arteries are of normal caliber. Thoracic  aorta is of normal caliber. No intramural hematoma, dissection, or  aneurysm. Mild atherosclerotic calcification within the descending  thoracic aorta. Arch vasculature demonstrates bovine arch anatomy  with wide patency proximally.   Mediastinum/Nodes: No enlarged mediastinal, hilar, or axillary lymph  nodes. Thyroid gland, trachea, and esophagus demonstrate no  significant findings.   Lungs/Pleura: Bilateral dependent atelectasis. Lungs are otherwise  clear. No pneumothorax or pleural effusion. Central airways are  widely patent.   Musculoskeletal: No chest wall abnormality. No acute or significant  osseous findings.   Review of the MIP images confirms the above findings.   CTA ABDOMEN AND PELVIS FINDINGS    VASCULAR   Aorta: Normal caliber. No aneurysm or dissection. No periaortic  inflammatory  change. Mild atherosclerotic calcification.   Celiac: Patent without evidence of aneurysm, dissection, vasculitis  or significant stenosis.   SMA: Patent without evidence of aneurysm, dissection, vasculitis or  significant stenosis.   Renals: Both renal arteries are patent without evidence of aneurysm,  dissection, vasculitis, fibromuscular dysplasia or significant  stenosis.   IMA: Patent without evidence of aneurysm, dissection, vasculitis or  significant stenosis.   Inflow: Patent without evidence of aneurysm, dissection, vasculitis  or significant stenosis.   Veins: No obvious venous abnormality within the limitations of this  arterial phase study. Note is made of a recanalized umbilical vein  in keeping with portal venous hypertension.   Review of the MIP images confirms the above findings.   NON-VASCULAR   Hepatobiliary: Cholelithiasis without pericholecystic inflammatory  change. The liver contour is nodular in keeping with cirrhosis. No  focal intrahepatic mass identified, though imaging is slightly  limited by relatively early phase of enhancement. No intra or  extrahepatic biliary ductal dilation.   Pancreas: Unremarkable   Spleen: Moderate splenomegaly with the spleen measuring up to 16.5  cm in greatest dimension. Stable probable cyst within the interpolar  region.   Adrenals/Urinary Tract: Adrenal glands are unremarkable. Kidneys are  normal, without renal calculi, focal lesion, or hydronephrosis.  Bladder is unremarkable save for moderate distension.   Stomach/Bowel: Status post descending colostomy and Hartmann pouch  formation. Stomach, small bowel, and large bowel are otherwise  unremarkable. Appendix absent. Moderate left lower quadrant fat  containing parastomal hernia. No free intraperitoneal gas or fluid.   Lymphatic: No pathologic adenopathy within the  abdomen and pelvis.   Reproductive: Prostate is unremarkable.   Other: Diastasis of the rectus abdominus musculature noted with  protuberance within the umbilical region without frank hernia  identified.   Musculoskeletal: No acute bone abnormality. No lytic or blastic bone  lesion.   Review of the MIP images confirms the above findings.   IMPRESSION:  No acute intrathoracic or intra-abdominal pathology identified. No  thoracoabdominal aortic aneurysm or dissection identified.   Extensive coronary artery calcification.   Cirrhosis. No definite focal intrahepatic mass on this limited  examination.   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   Status post descending colostomy and Hartmann pouch formation.  Moderate parastomal hernia without evidence of obstruction.   Aortic Atherosclerosis (ICD10-I70.0).   Electronically Signed    By: Fidela Salisbury M.D.    On: 12/04/2021 00:53 Procedure Note  Charlaine Dalton, MD - 12/04/2021  Formatting of this note might be different from the original.  CLINICAL DATA:  Altered mental status, syncope, hypoxia, aortic  dissection   EXAM:  CT ANGIOGRAPHY CHEST, ABDOMEN AND PELVIS   TECHNIQUE:  Non-contrast CT of the chest was initially obtained.   Multidetector CT imaging through the chest, abdomen and pelvis was  performed using the standard protocol during bolus administration of  intravenous contrast. Multiplanar reconstructed images and MIPs were  obtained and reviewed to evaluate the vascular anatomy.   RADIATION DOSE REDUCTION: This exam was performed according to the  departmental dose-optimization program which includes automated  exposure control, adjustment of the mA and/or kV according to  patient size and/or use of iterative reconstruction technique.   CONTRAST:  133m OMNIPAQUE IOHEXOL 350 MG/ML SOLN   COMPARISON:  CT abdomen pelvis 04/08/2021    FINDINGS:  CTA CHEST FINDINGS   Cardiovascular: Extensive multi-vessel coronary artery  calcification. Cardiac size within normal limits. No pericardial  effusion. Central pulmonary arteries are of normal caliber. Thoracic  aorta is of normal caliber. No intramural hematoma, dissection, or  aneurysm. Mild atherosclerotic calcification within the descending  thoracic aorta. Arch vasculature demonstrates bovine arch anatomy  with wide patency proximally.   Mediastinum/Nodes: No enlarged mediastinal, hilar, or axillary lymph  nodes. Thyroid gland, trachea, and esophagus demonstrate no  significant findings.   Lungs/Pleura: Bilateral dependent atelectasis. Lungs are otherwise  clear. No pneumothorax or pleural effusion. Central airways are  widely patent.   Musculoskeletal: No chest wall abnormality. No acute or significant  osseous findings.   Review of the MIP images confirms the above findings.   CTA ABDOMEN AND PELVIS FINDINGS   VASCULAR   Aorta: Normal caliber. No aneurysm or dissection. No periaortic  inflammatory change. Mild atherosclerotic calcification.   Celiac: Patent without evidence of aneurysm, dissection, vasculitis  or significant stenosis.   SMA: Patent without evidence of aneurysm, dissection, vasculitis or  significant stenosis.   Renals: Both renal arteries are patent without evidence of aneurysm,  dissection, vasculitis, fibromuscular dysplasia or significant  stenosis.   IMA: Patent without evidence of aneurysm, dissection, vasculitis or  significant stenosis.   Inflow: Patent without evidence of aneurysm, dissection, vasculitis  or significant stenosis.   Veins: No obvious venous abnormality within the limitations of this  arterial phase study. Note is made of a recanalized umbilical vein  in keeping with portal venous hypertension.   Review of the MIP images confirms the above findings.   NON-VASCULAR   Hepatobiliary: Cholelithiasis  without pericholecystic inflammatory  change. The liver contour is nodular in keeping with cirrhosis. No  focal intrahepatic mass identified, though imaging is slightly  limited by relatively early phase of enhancement. No intra or  extrahepatic biliary ductal dilation.   Pancreas: Unremarkable   Spleen: Moderate splenomegaly with the spleen measuring up to 16.5  cm in greatest dimension. Stable probable cyst within the interpolar  region.   Adrenals/Urinary Tract: Adrenal glands are unremarkable. Kidneys are  normal, without renal calculi, focal lesion, or hydronephrosis.  Bladder is unremarkable save for moderate distension.   Stomach/Bowel: Status post descending colostomy and Hartmann pouch  formation. Stomach, small bowel, and large bowel are otherwise  unremarkable. Appendix absent. Moderate left lower quadrant fat  containing parastomal hernia. No free intraperitoneal gas or fluid.   Lymphatic: No pathologic adenopathy within the abdomen and pelvis.   Reproductive: Prostate is unremarkable.   Other: Diastasis of the rectus abdominus musculature noted with  protuberance within the umbilical region without frank hernia  identified.   Musculoskeletal: No acute bone abnormality. No lytic or blastic bone  lesion.   Review of the MIP images confirms the above findings.   IMPRESSION:  No acute intrathoracic or intra-abdominal pathology identified. No  thoracoabdominal aortic aneurysm or dissection identified.   Extensive coronary artery calcification.   Cirrhosis. No definite focal intrahepatic mass on this limited  examination.   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   Status post descending colostomy and Hartmann pouch formation.  Moderate parastomal hernia without evidence of obstruction.   Aortic Atherosclerosis (ICD10-I70.0).   Electronically Signed    By: Fidela Salisbury M.D.    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 Explained to patient the diastasis cannot be  addressed at the time of surgery.  Plan:    Discussed pathophisiology. The risk of laparoscopic colon resection surgery includes, but not limited to, recurrence, bleeding, chronic pain, post-op infxn, post-op SBO or ileus, hernias, resection of bowel, re-anastamosis, possible ostomy placement and need for re-operation to address said risks. The risks of general anesthetic, if used, includes MI, CVA, sudden death or even reaction to anesthetic medications also discussed. Alternatives include continued observation. Benefits include possible symptom relief, preventing further decline in health and possible death.  Typical post-op recovery time of additional days in hospital for observation afterwards also discussed.  Prep ordered. Will proceed with ERAS protocol as well. Pending medical clearance and workup as noted above.  R/b/a of colonoscopy discussed. Risks include bleeding, perforation. Benefits include diagnostic, curative procedure if needed. Alternatives include continued observation. Pt verbalized understanding.  Thrombocytopenia and cirrhosis- check INR, plt count day of surgery.   The patient verbalized understanding and all questions were answered to the patient's satisfaction.  labs/images/medications/previous chart entries reviewed personally and relevant changes/updates noted above.

## 2022-01-27 NOTE — Anesthesia Preprocedure Evaluation (Addendum)
Anesthesia Evaluation  Patient identified by MRN, date of birth, ID band Patient awake    Reviewed: Allergy & Precautions, NPO status , Patient's Chart, lab work & pertinent test results  History of Anesthesia Complications Negative for: history of anesthetic complications  Airway Mallampati: II  TM Distance: >3 FB Neck ROM: Full    Dental  (+) Poor Dentition, Missing   Pulmonary neg pulmonary ROS, neg sleep apnea, neg COPD, Patient abstained from smoking.Not current smoker, former smoker,    breath sounds clear to auscultation       Cardiovascular Exercise Tolerance: Good METShypertension, Pt. on medications +CHF  (-) CAD and (-) Past MI (-) dysrhythmias  Rhythm:Regular Rate:Normal - Systolic murmurs Hx of CHF exacerbation 15 years ago, said he was intubated and "put into a medical coma" to save his life. Since then, patient says he follows up with cardiologist and at his last visit, said no changes to meds and stable.   Neuro/Psych PSYCHIATRIC DISORDERS Anxiety Depression Bipolar Disorder  Neuromuscular disease    GI/Hepatic GERD  ,(+) Cirrhosis     (-) substance abuse  ,   Endo/Other  diabetes, Poorly Controlled  Renal/GU Renal disease     Musculoskeletal   Abdominal   Peds  Hematology  (+) Blood dyscrasia, anemia ,   Anesthesia Other Findings Past Medical History: No date: Anemia No date: CHF (congestive heart failure) (HCC)     Comment:  DIASTOLIC No date: Cirrhosis ([place]) No date: Diabetes mellitus No date: Dizziness No date: Hypertension No date: Syncope and collapse No date: Tachycardia  Reproductive/Obstetrics                            Anesthesia Physical  Anesthesia Plan  ASA: 3  Anesthesia Plan: General   Post-op Pain Management: Minimal or no pain anticipated   Induction: Intravenous  PONV Risk Score and Plan: 2 and Ondansetron, Treatment may vary due to age or  medical condition and Propofol infusion  Airway Management Planned: Natural Airway  Additional Equipment: None  Intra-op Plan:   Post-operative Plan:   Informed Consent: I have reviewed the patients History and Physical, chart, labs and discussed the procedure including the risks, benefits and alternatives for the proposed anesthesia with the patient or authorized representative who has indicated his/her understanding and acceptance.     Dental advisory given  Plan Discussed with: CRNA and Surgeon  Anesthesia Plan Comments: (Discussed risks of anesthesia with patient, including possibility of difficulty with spontaneous ventilation under anesthesia necessitating airway intervention, PONV, and rare risks such as cardiac or respiratory or neurological events, and allergic reactions. Discussed the role of CRNA in patient's perioperative care. Patient understands.)        Anesthesia Quick Evaluation

## 2022-01-27 NOTE — Anesthesia Procedure Notes (Signed)
Date/Time: 01/27/2022 11:04 AM  Performed by: Johnna Acosta, CRNAPre-anesthesia Checklist: Patient identified, Emergency Drugs available, Suction available, Patient being monitored and Timeout performed Patient Re-evaluated:Patient Re-evaluated prior to induction Oxygen Delivery Method: Nasal cannula Induction Type: IV induction

## 2022-01-28 ENCOUNTER — Encounter: Payer: Self-pay | Admitting: Surgery

## 2022-01-28 LAB — SURGICAL PATHOLOGY

## 2022-02-01 ENCOUNTER — Inpatient Hospital Stay: Payer: Medicaid Other | Admitting: Urgent Care

## 2022-02-01 ENCOUNTER — Other Ambulatory Visit: Payer: Self-pay

## 2022-02-01 ENCOUNTER — Encounter
Admission: RE | Disposition: A | Discharge status: Home / Self Care | Payer: Self-pay | Source: Home / Self Care | Attending: Surgery

## 2022-02-01 ENCOUNTER — Encounter: Payer: Self-pay | Admitting: Surgery

## 2022-02-01 ENCOUNTER — Inpatient Hospital Stay
Admission: RE | Admit: 2022-02-01 | Discharge: 2022-02-07 | DRG: 329 | Disposition: A | Discharge status: Home / Self Care | Payer: Medicaid Other | Attending: Surgery | Admitting: Surgery

## 2022-02-01 DIAGNOSIS — E8809 Other disorders of plasma-protein metabolism, not elsewhere classified: Secondary | ICD-10-CM | POA: Diagnosis not present

## 2022-02-01 DIAGNOSIS — Z7984 Long term (current) use of oral hypoglycemic drugs: Secondary | ICD-10-CM | POA: Diagnosis not present

## 2022-02-01 DIAGNOSIS — Z433 Encounter for attention to colostomy: Secondary | ICD-10-CM | POA: Diagnosis not present

## 2022-02-01 DIAGNOSIS — K435 Parastomal hernia without obstruction or  gangrene: Secondary | ICD-10-CM | POA: Diagnosis not present

## 2022-02-01 DIAGNOSIS — K661 Hemoperitoneum: Secondary | ICD-10-CM | POA: Diagnosis not present

## 2022-02-01 DIAGNOSIS — Z882 Allergy status to sulfonamides status: Secondary | ICD-10-CM

## 2022-02-01 DIAGNOSIS — Z794 Long term (current) use of insulin: Secondary | ICD-10-CM

## 2022-02-01 DIAGNOSIS — Z87891 Personal history of nicotine dependence: Secondary | ICD-10-CM | POA: Diagnosis not present

## 2022-02-01 DIAGNOSIS — Z880 Allergy status to penicillin: Secondary | ICD-10-CM

## 2022-02-01 DIAGNOSIS — R188 Other ascites: Secondary | ICD-10-CM | POA: Diagnosis not present

## 2022-02-01 DIAGNOSIS — Z5331 Laparoscopic surgical procedure converted to open procedure: Secondary | ICD-10-CM | POA: Diagnosis not present

## 2022-02-01 DIAGNOSIS — I509 Heart failure, unspecified: Secondary | ICD-10-CM | POA: Diagnosis present

## 2022-02-01 DIAGNOSIS — Z79899 Other long term (current) drug therapy: Secondary | ICD-10-CM | POA: Diagnosis not present

## 2022-02-01 DIAGNOSIS — E1165 Type 2 diabetes mellitus with hyperglycemia: Secondary | ICD-10-CM | POA: Diagnosis present

## 2022-02-01 DIAGNOSIS — G4700 Insomnia, unspecified: Secondary | ICD-10-CM

## 2022-02-01 DIAGNOSIS — K567 Ileus, unspecified: Secondary | ICD-10-CM | POA: Diagnosis not present

## 2022-02-01 DIAGNOSIS — D696 Thrombocytopenia, unspecified: Secondary | ICD-10-CM | POA: Diagnosis present

## 2022-02-01 DIAGNOSIS — Z933 Colostomy status: Secondary | ICD-10-CM | POA: Diagnosis not present

## 2022-02-01 DIAGNOSIS — E1140 Type 2 diabetes mellitus with diabetic neuropathy, unspecified: Secondary | ICD-10-CM

## 2022-02-01 DIAGNOSIS — K660 Peritoneal adhesions (postprocedural) (postinfection): Secondary | ICD-10-CM | POA: Diagnosis present

## 2022-02-01 DIAGNOSIS — K7460 Unspecified cirrhosis of liver: Secondary | ICD-10-CM | POA: Diagnosis not present

## 2022-02-01 DIAGNOSIS — Z01812 Encounter for preprocedural laboratory examination: Principal | ICD-10-CM

## 2022-02-01 DIAGNOSIS — E118 Type 2 diabetes mellitus with unspecified complications: Secondary | ICD-10-CM

## 2022-02-01 DIAGNOSIS — I110 Hypertensive heart disease with heart failure: Secondary | ICD-10-CM | POA: Diagnosis not present

## 2022-02-01 HISTORY — PX: XI ROBOTIC ASSISTED COLOSTOMY TAKEDOWN: SHX6828

## 2022-02-01 LAB — CBC
HCT: 37.1 % — ABNORMAL LOW (ref 39.0–52.0)
Hemoglobin: 12.2 g/dL — ABNORMAL LOW (ref 13.0–17.0)
MCH: 26.8 pg (ref 26.0–34.0)
MCHC: 32.9 g/dL (ref 30.0–36.0)
MCV: 81.5 fL (ref 80.0–100.0)
Platelets: 83 10*3/uL — ABNORMAL LOW (ref 150–400)
RBC: 4.55 MIL/uL (ref 4.22–5.81)
RDW: 14.8 % (ref 11.5–15.5)
WBC: 3 10*3/uL — ABNORMAL LOW (ref 4.0–10.5)
nRBC: 0 % (ref 0.0–0.2)

## 2022-02-01 LAB — PROTIME-INR
INR: 1.3 — ABNORMAL HIGH (ref 0.8–1.2)
Prothrombin Time: 15.9 seconds — ABNORMAL HIGH (ref 11.4–15.2)

## 2022-02-01 LAB — HEMOGLOBIN AND HEMATOCRIT, BLOOD
HCT: 35.5 % — ABNORMAL LOW (ref 39.0–52.0)
Hemoglobin: 11.5 g/dL — ABNORMAL LOW (ref 13.0–17.0)

## 2022-02-01 LAB — GLUCOSE, CAPILLARY
Glucose-Capillary: 189 mg/dL — ABNORMAL HIGH (ref 70–99)
Glucose-Capillary: 286 mg/dL — ABNORMAL HIGH (ref 70–99)
Glucose-Capillary: 242 mg/dL — ABNORMAL HIGH (ref 70–99)
Glucose-Capillary: 215 mg/dL — ABNORMAL HIGH (ref 70–99)

## 2022-02-01 LAB — PREPARE RBC (CROSSMATCH)

## 2022-02-01 SURGERY — CLOSURE, COLOSTOMY, ROBOT-ASSISTED
Anesthesia: General | Site: Abdomen

## 2022-02-01 MED ORDER — DEXAMETHASONE SODIUM PHOSPHATE 10 MG/ML IJ SOLN
INTRAMUSCULAR | Status: DC | PRN
  Administered 2022-02-01: 5 mg via INTRAVENOUS

## 2022-02-01 MED ORDER — PHENYLEPHRINE HCL (PRESSORS) 10 MG/ML IV SOLN
INTRAVENOUS | Status: DC | PRN
  Administered 2022-02-01: 160 ug via INTRAVENOUS
  Administered 2022-02-01: 80 ug via INTRAVENOUS

## 2022-02-01 MED ORDER — ROCURONIUM BROMIDE 10 MG/ML (PF) SYRINGE
PREFILLED_SYRINGE | INTRAVENOUS | Status: AC
  Filled 2022-02-01: qty 10

## 2022-02-01 MED ORDER — INSULIN ASPART 100 UNIT/ML IJ SOLN
0.0000 [IU] | Freq: Three times a day (TID) | INTRAMUSCULAR | Status: DC
  Administered 2022-02-02: 3 [IU] via SUBCUTANEOUS
  Administered 2022-02-02: 2 [IU] via SUBCUTANEOUS
  Filled 2022-02-01 (×2): qty 1

## 2022-02-01 MED ORDER — INSULIN GLARGINE-YFGN 100 UNIT/ML ~~LOC~~ SOLN
40.0000 [IU] | Freq: Every day | SUBCUTANEOUS | Status: DC
  Administered 2022-02-01 – 2022-02-03 (×3): 40 [IU] via SUBCUTANEOUS
  Filled 2022-02-01 (×4): qty 0.4

## 2022-02-01 MED ORDER — OXYCODONE HCL 5 MG/5ML PO SOLN: 5.0000 mg | Freq: Once | ORAL | Status: DC | PRN

## 2022-02-01 MED ORDER — HYDROMORPHONE HCL 1 MG/ML IJ SOLN
INTRAMUSCULAR | Status: AC
  Filled 2022-02-01: qty 1

## 2022-02-01 MED ORDER — ONDANSETRON HCL 4 MG/2ML IJ SOLN
INTRAMUSCULAR | Status: DC | PRN
  Administered 2022-02-01: 4 mg via INTRAVENOUS

## 2022-02-01 MED ORDER — BUPIVACAINE LIPOSOME 1.3 % IJ SUSP
INTRAMUSCULAR | Status: AC
  Filled 2022-02-01: qty 20

## 2022-02-01 MED ORDER — FENTANYL CITRATE (PF) 100 MCG/2ML IJ SOLN
INTRAMUSCULAR | Status: AC
  Filled 2022-02-01: qty 2

## 2022-02-01 MED ORDER — INSULIN ASPART 100 UNIT/ML IJ SOLN
INTRAMUSCULAR | Status: AC
  Filled 2022-02-01: qty 1

## 2022-02-01 MED ORDER — CELECOXIB 200 MG PO CAPS: 200.0000 mg | ORAL_CAPSULE | ORAL | Status: AC

## 2022-02-01 MED ORDER — DROPERIDOL 2.5 MG/ML IJ SOLN
0.6250 mg | Freq: Once | INTRAMUSCULAR | Status: DC | PRN
Start: 2022-02-01 — End: 2022-02-01

## 2022-02-01 MED ORDER — CYCLOSPORINE 0.05 % OP EMUL
1.0000 [drp] | Freq: Two times a day (BID) | OPHTHALMIC | Status: DC
Start: 2022-02-01 — End: 2022-02-07
  Administered 2022-02-01 – 2022-02-07 (×12): 1 [drp] via OPHTHALMIC
  Filled 2022-02-01 (×12): qty 30

## 2022-02-01 MED ORDER — ORAL CARE MOUTH RINSE: 15.0000 mL | Freq: Once | OROMUCOSAL | Status: AC

## 2022-02-01 MED ORDER — HYDROMORPHONE HCL 1 MG/ML IJ SOLN
INTRAMUSCULAR | Status: DC | PRN
  Administered 2022-02-01 (×2): .5 mg via INTRAVENOUS

## 2022-02-01 MED ORDER — LIDOCAINE HCL (PF) 2 % IJ SOLN
INTRAMUSCULAR | Status: AC
  Filled 2022-02-01: qty 5

## 2022-02-01 MED ORDER — GLYCOPYRROLATE 0.2 MG/ML IJ SOLN
INTRAMUSCULAR | Status: DC | PRN
  Administered 2022-02-01: .1 mg via INTRAVENOUS

## 2022-02-01 MED ORDER — KETAMINE HCL 10 MG/ML IJ SOLN
INTRAMUSCULAR | Status: DC | PRN
  Administered 2022-02-01: 10 mg via INTRAVENOUS
  Administered 2022-02-01: 30 mg via INTRAVENOUS
  Administered 2022-02-01: 10 mg via INTRAVENOUS

## 2022-02-01 MED ORDER — ACETAMINOPHEN 10 MG/ML IV SOLN
INTRAVENOUS | Status: AC
  Filled 2022-02-01: qty 100

## 2022-02-01 MED ORDER — DEXAMETHASONE SODIUM PHOSPHATE 10 MG/ML IJ SOLN
INTRAMUSCULAR | Status: AC
Start: 2022-02-01 — End: ?
  Filled 2022-02-01: qty 1

## 2022-02-01 MED ORDER — CHLORHEXIDINE GLUCONATE 0.12 % MT SOLN
15.0000 mL | Freq: Once | OROMUCOSAL | Status: AC
Start: 2022-02-01 — End: 2022-02-01

## 2022-02-01 MED ORDER — SODIUM CHLORIDE (PF) 0.9 % IJ SOLN
INTRAMUSCULAR | Status: AC
Start: 2022-02-01 — End: ?
  Filled 2022-02-01: qty 50

## 2022-02-01 MED ORDER — CELECOXIB 200 MG PO CAPS
200.0000 mg | ORAL_CAPSULE | Freq: Two times a day (BID) | ORAL | Status: DC
  Administered 2022-02-01 – 2022-02-03 (×5): 200 mg via ORAL
  Filled 2022-02-01 (×6): qty 1

## 2022-02-01 MED ORDER — ACETAMINOPHEN 500 MG PO TABS
ORAL_TABLET | ORAL | Status: AC
  Administered 2022-02-01: 1000 mg via ORAL
  Filled 2022-02-01: qty 2

## 2022-02-01 MED ORDER — SODIUM CHLORIDE 0.9 % IV SOLN
INTRAVENOUS | Status: AC
  Filled 2022-02-01: qty 2

## 2022-02-01 MED ORDER — KETAMINE HCL 50 MG/5ML IJ SOSY
PREFILLED_SYRINGE | INTRAMUSCULAR | Status: AC
  Filled 2022-02-01: qty 5

## 2022-02-01 MED ORDER — LISINOPRIL 20 MG PO TABS
20.0000 mg | ORAL_TABLET | Freq: Every day | ORAL | Status: DC
  Administered 2022-02-01 – 2022-02-07 (×7): 20 mg via ORAL
  Filled 2022-02-01 (×7): qty 1

## 2022-02-01 MED ORDER — TRAMADOL HCL 50 MG PO TABS
50.0000 mg | ORAL_TABLET | Freq: Four times a day (QID) | ORAL | Status: DC | PRN
  Administered 2022-02-05: 50 mg via ORAL
  Filled 2022-02-01: qty 1

## 2022-02-01 MED ORDER — ONDANSETRON HCL 4 MG/2ML IJ SOLN
INTRAMUSCULAR | Status: AC
  Filled 2022-02-01: qty 2

## 2022-02-01 MED ORDER — INSULIN ASPART 100 UNIT/ML IJ SOLN
5.0000 [IU] | Freq: Once | INTRAMUSCULAR | Status: AC
  Administered 2022-02-01: 5 [IU] via SUBCUTANEOUS

## 2022-02-01 MED ORDER — CHLORHEXIDINE GLUCONATE CLOTH 2 % EX PADS: 6.0000 | MEDICATED_PAD | Freq: Once | CUTANEOUS | Status: DC

## 2022-02-01 MED ORDER — GLYCOPYRROLATE 0.2 MG/ML IJ SOLN
INTRAMUSCULAR | Status: AC
  Filled 2022-02-01: qty 1

## 2022-02-01 MED ORDER — "VISTASEAL 4 ML SINGLE DOSE KIT "
PACK | CUTANEOUS | Status: DC | PRN
  Administered 2022-02-01: 4 mL via TOPICAL

## 2022-02-01 MED ORDER — ACETAMINOPHEN 500 MG PO TABS
1000.0000 mg | ORAL_TABLET | Freq: Four times a day (QID) | ORAL | Status: DC
  Administered 2022-02-01 – 2022-02-04 (×8): 1000 mg via ORAL
  Filled 2022-02-01 (×11): qty 2

## 2022-02-01 MED ORDER — PROPOFOL 10 MG/ML IV BOLUS
INTRAVENOUS | Status: DC | PRN
  Administered 2022-02-01: 150 mg via INTRAVENOUS

## 2022-02-01 MED ORDER — OXYCODONE HCL 5 MG PO TABS: 5.0000 mg | ORAL_TABLET | Freq: Once | ORAL | Status: DC | PRN

## 2022-02-01 MED ORDER — ACETAMINOPHEN 500 MG PO TABS: 1000.0000 mg | ORAL_TABLET | ORAL | Status: AC

## 2022-02-01 MED ORDER — CHLORHEXIDINE GLUCONATE 0.12 % MT SOLN
OROMUCOSAL | Status: AC
  Administered 2022-02-01: 15 mL via OROMUCOSAL
  Filled 2022-02-01: qty 15

## 2022-02-01 MED ORDER — INDOCYANINE GREEN 25 MG IV SOLR
5.0000 mg | Freq: Once | INTRAVENOUS | Status: AC
  Administered 2022-02-01: 5 mg via INTRAVENOUS
  Filled 2022-02-01: qty 2

## 2022-02-01 MED ORDER — LIDOCAINE HCL (CARDIAC) PF 100 MG/5ML IV SOSY
PREFILLED_SYRINGE | INTRAVENOUS | Status: DC | PRN
  Administered 2022-02-01: 100 mg via INTRAVENOUS

## 2022-02-01 MED ORDER — SODIUM CHLORIDE 0.9 % IV SOLN
2.0000 g | INTRAVENOUS | Status: AC
  Administered 2022-02-01: 2 g via INTRAVENOUS

## 2022-02-01 MED ORDER — SIMVASTATIN 20 MG PO TABS
40.0000 mg | ORAL_TABLET | Freq: Every day | ORAL | Status: DC
  Administered 2022-02-01 – 2022-02-06 (×6): 40 mg via ORAL
  Filled 2022-02-01 (×6): qty 2

## 2022-02-01 MED ORDER — DEXMEDETOMIDINE HCL IN NACL 80 MCG/20ML IV SOLN
INTRAVENOUS | Status: AC
  Filled 2022-02-01: qty 20

## 2022-02-01 MED ORDER — ONDANSETRON 4 MG PO TBDP: 4.0000 mg | ORAL_TABLET | Freq: Four times a day (QID) | ORAL | Status: DC | PRN

## 2022-02-01 MED ORDER — ROCURONIUM BROMIDE 10 MG/ML (PF) SYRINGE
PREFILLED_SYRINGE | INTRAVENOUS | Status: AC
Start: 2022-02-01 — End: ?
  Filled 2022-02-01: qty 10

## 2022-02-01 MED ORDER — PREGABALIN 50 MG PO CAPS
100.0000 mg | ORAL_CAPSULE | Freq: Three times a day (TID) | ORAL | Status: DC
  Administered 2022-02-01 – 2022-02-07 (×17): 100 mg via ORAL
  Filled 2022-02-01 (×17): qty 2

## 2022-02-01 MED ORDER — ONDANSETRON HCL 4 MG/2ML IJ SOLN: 4.0000 mg | Freq: Four times a day (QID) | INTRAMUSCULAR | Status: DC | PRN

## 2022-02-01 MED ORDER — INSULIN ASPART 100 UNIT/ML IJ SOLN
4.0000 [IU] | Freq: Three times a day (TID) | INTRAMUSCULAR | Status: DC
  Filled 2022-02-01: qty 1

## 2022-02-01 MED ORDER — ZOLPIDEM TARTRATE 5 MG PO TABS
10.0000 mg | ORAL_TABLET | Freq: Every day | ORAL | Status: DC
  Administered 2022-02-01 – 2022-02-05 (×5): 10 mg via ORAL
  Filled 2022-02-01 (×6): qty 2

## 2022-02-01 MED ORDER — BUPIVACAINE HCL (PF) 0.5 % IJ SOLN
INTRAMUSCULAR | Status: AC
  Filled 2022-02-01: qty 30

## 2022-02-01 MED ORDER — ACETAMINOPHEN 10 MG/ML IV SOLN
1000.0000 mg | Freq: Once | INTRAVENOUS | Status: DC | PRN
  Administered 2022-02-01: 1000 mg via INTRAVENOUS

## 2022-02-01 MED ORDER — OXYCODONE HCL 5 MG PO TABS
5.0000 mg | ORAL_TABLET | ORAL | Status: DC | PRN
  Administered 2022-02-03: 10 mg via ORAL
  Filled 2022-02-01: qty 2

## 2022-02-01 MED ORDER — LISDEXAMFETAMINE DIMESYLATE 70 MG PO CAPS
70.0000 mg | ORAL_CAPSULE | Freq: Every day | ORAL | Status: DC
  Administered 2022-02-02 – 2022-02-07 (×6): 70 mg via ORAL
  Filled 2022-02-01 (×6): qty 1

## 2022-02-01 MED ORDER — MIDAZOLAM HCL 2 MG/2ML IJ SOLN
INTRAMUSCULAR | Status: AC
Start: 2022-02-01 — End: ?
  Filled 2022-02-01: qty 2

## 2022-02-01 MED ORDER — FENTANYL CITRATE (PF) 100 MCG/2ML IJ SOLN
25.0000 ug | INTRAMUSCULAR | Status: DC | PRN
  Administered 2022-02-01 (×3): 50 ug via INTRAVENOUS

## 2022-02-01 MED ORDER — MIDAZOLAM HCL 2 MG/2ML IJ SOLN
INTRAMUSCULAR | Status: DC | PRN
  Administered 2022-02-01: 2 mg via INTRAVENOUS

## 2022-02-01 MED ORDER — DEXMEDETOMIDINE (PRECEDEX) IN NS 20 MCG/5ML (4 MCG/ML) IV SYRINGE
PREFILLED_SYRINGE | INTRAVENOUS | Status: DC | PRN
  Administered 2022-02-01 (×3): 4 ug via INTRAVENOUS
  Administered 2022-02-01: 8 ug via INTRAVENOUS

## 2022-02-01 MED ORDER — SODIUM CHLORIDE 0.9 % IV SOLN: INTRAVENOUS | Status: DC

## 2022-02-01 MED ORDER — LITHIUM CARBONATE ER 450 MG PO TBCR
450.0000 mg | EXTENDED_RELEASE_TABLET | Freq: Every day | ORAL | Status: DC
Start: 2022-02-01 — End: 2022-02-07
  Administered 2022-02-02 – 2022-02-07 (×6): 450 mg via ORAL
  Filled 2022-02-01 (×7): qty 1

## 2022-02-01 MED ORDER — PROPOFOL 10 MG/ML IV BOLUS
INTRAVENOUS | Status: AC
  Filled 2022-02-01: qty 20

## 2022-02-01 MED ORDER — HYDROMORPHONE HCL 1 MG/ML IJ SOLN
1.0000 mg | INTRAMUSCULAR | Status: DC | PRN
  Administered 2022-02-01 – 2022-02-02 (×3): 1 mg via INTRAVENOUS
  Filled 2022-02-01 (×3): qty 1

## 2022-02-01 MED ORDER — SODIUM CHLORIDE (PF) 0.9 % IJ SOLN
INTRAMUSCULAR | Status: DC | PRN
  Administered 2022-02-01: 100 mL

## 2022-02-01 MED ORDER — FENTANYL CITRATE (PF) 100 MCG/2ML IJ SOLN
INTRAMUSCULAR | Status: DC | PRN
Start: 2022-02-01 — End: 2022-02-01
  Administered 2022-02-01 (×2): 50 ug via INTRAVENOUS

## 2022-02-01 MED ORDER — SODIUM CHLORIDE 0.9 % IR SOLN
Status: DC | PRN
  Administered 2022-02-01: 150 mL

## 2022-02-01 MED ORDER — ROCURONIUM BROMIDE 100 MG/10ML IV SOLN
INTRAVENOUS | Status: DC | PRN
  Administered 2022-02-01 (×3): 10 mg via INTRAVENOUS
  Administered 2022-02-01: 20 mg via INTRAVENOUS
  Administered 2022-02-01: 60 mg via INTRAVENOUS
  Administered 2022-02-01 (×2): 10 mg via INTRAVENOUS

## 2022-02-01 MED ORDER — PROMETHAZINE HCL 25 MG/ML IJ SOLN: 6.2500 mg | INTRAMUSCULAR | Status: DC | PRN

## 2022-02-01 MED ORDER — 0.9 % SODIUM CHLORIDE (POUR BTL) OPTIME
TOPICAL | Status: DC | PRN
  Administered 2022-02-01: 900 mL

## 2022-02-01 MED ORDER — SUGAMMADEX SODIUM 200 MG/2ML IV SOLN
INTRAVENOUS | Status: DC | PRN
  Administered 2022-02-01: 200 mg via INTRAVENOUS

## 2022-02-01 MED ORDER — CELECOXIB 200 MG PO CAPS
ORAL_CAPSULE | ORAL | Status: AC
  Administered 2022-02-01: 200 mg via ORAL
  Filled 2022-02-01: qty 1

## 2022-02-01 MED ORDER — INSULIN ASPART 100 UNIT/ML IJ SOLN
INTRAMUSCULAR | Status: AC
  Administered 2022-02-01: 4 [IU]
  Filled 2022-02-01: qty 1

## 2022-02-01 MED ORDER — OLANZAPINE 10 MG PO TABS
10.0000 mg | ORAL_TABLET | Freq: Every day | ORAL | Status: DC
  Administered 2022-02-01 – 2022-02-06 (×6): 10 mg via ORAL
  Filled 2022-02-01 (×6): qty 1

## 2022-02-01 MED ORDER — METHYLPHENIDATE HCL 10 MG PO TABS: 10.0000 mg | ORAL_TABLET | Freq: Every day | ORAL | Status: DC

## 2022-02-01 SURGICAL SUPPLY — 87 items
ADH SKN CLS APL DERMABOND .7 (GAUZE/BANDAGES/DRESSINGS)
APL LAPSCP 45 DL APL FL B (MISCELLANEOUS) ×1
APPLICATOR VISTASEAL FLEXIBLE (MISCELLANEOUS) ×1 IMPLANT
BASIN KIT SINGLE STR (MISCELLANEOUS) ×2 IMPLANT
BLADE CLIPPER SURG (BLADE) ×2 IMPLANT
BLADE SURG SZ11 CARB STEEL (BLADE) ×2 IMPLANT
COVER TIP SHEARS 8 DVNC (MISCELLANEOUS) ×1 IMPLANT
COVER TIP SHEARS 8MM DA VINCI (MISCELLANEOUS) ×2
DERMABOND ADVANCED (GAUZE/BANDAGES/DRESSINGS)
DERMABOND ADVANCED .7 DNX12 (GAUZE/BANDAGES/DRESSINGS) IMPLANT
DRAIN JP 15F RND TROCAR (DRAIN) ×2 IMPLANT
DRAPE ARM DVNC X/XI (DISPOSABLE) ×4 IMPLANT
DRAPE COLUMN DVNC XI (DISPOSABLE) ×1 IMPLANT
DRAPE DA VINCI XI ARM (DISPOSABLE) ×8
DRAPE DA VINCI XI COLUMN (DISPOSABLE) ×2
DRAPE LEGGINS SURG 28X43 STRL (DRAPES) ×2 IMPLANT
DRSG OPSITE POSTOP 3X4 (GAUZE/BANDAGES/DRESSINGS) ×4 IMPLANT
DRSG OPSITE POSTOP 4X10 (GAUZE/BANDAGES/DRESSINGS) IMPLANT
DRSG OPSITE POSTOP 4X8 (GAUZE/BANDAGES/DRESSINGS) ×1 IMPLANT
DRSG TELFA 3X8 NADH (GAUZE/BANDAGES/DRESSINGS) ×2 IMPLANT
ELECT BLADE 6.5 EXT (BLADE) ×1 IMPLANT
ELECT REM PT RETURN 9FT ADLT (ELECTROSURGICAL) ×2
ELECTRODE REM PT RTRN 9FT ADLT (ELECTROSURGICAL) ×1 IMPLANT
EVACUATOR SILICONE 100CC (DRAIN) ×2 IMPLANT
GAUZE SPONGE 4X4 12PLY STRL (GAUZE/BANDAGES/DRESSINGS) ×1 IMPLANT
GLOVE BIOGEL PI IND STRL 7.0 (GLOVE) ×3 IMPLANT
GLOVE BIOGEL PI INDICATOR 7.0 (GLOVE) ×3
GLOVE SURG SYN 6.5 ES PF (GLOVE) ×6 IMPLANT
GLOVE SURG SYN 6.5 PF PI (GLOVE) ×3 IMPLANT
GOWN STRL REUS W/ TWL LRG LVL3 (GOWN DISPOSABLE) ×6 IMPLANT
GOWN STRL REUS W/TWL LRG LVL3 (GOWN DISPOSABLE) ×12
HANDLE YANKAUER SUCT BULB TIP (MISCELLANEOUS) ×2 IMPLANT
IRRIGATION STRYKERFLOW (MISCELLANEOUS) IMPLANT
IRRIGATOR STRYKERFLOW (MISCELLANEOUS) ×2
IV NS 1000ML (IV SOLUTION) ×2
IV NS 1000ML BAXH (IV SOLUTION) IMPLANT
KIT PINK PAD W/HEAD ARE REST (MISCELLANEOUS) ×2
KIT PINK PAD W/HEAD ARM REST (MISCELLANEOUS) ×1 IMPLANT
LABEL OR SOLS (LABEL) ×1 IMPLANT
MANIFOLD NEPTUNE II (INSTRUMENTS) ×2 IMPLANT
NDL INSUFFLATION 14GA 120MM (NEEDLE) ×1 IMPLANT
NEEDLE HYPO 22GX1.5 SAFETY (NEEDLE) ×2 IMPLANT
NEEDLE INSUFFLATION 14GA 120MM (NEEDLE) ×2 IMPLANT
OBTURATOR OPTICAL STANDARD 8MM (TROCAR) ×2
OBTURATOR OPTICAL STND 8 DVNC (TROCAR) ×1
OBTURATOR OPTICALSTD 8 DVNC (TROCAR) ×1 IMPLANT
PACK LAP CHOLECYSTECTOMY (MISCELLANEOUS) ×2 IMPLANT
PAD DRESSING TELFA 3X8 NADH (GAUZE/BANDAGES/DRESSINGS) IMPLANT
PENCIL ELECTRO HAND CTR (MISCELLANEOUS) ×2 IMPLANT
RELOAD STAPLE 60 3.5 BLU DVNC (STAPLE) IMPLANT
RELOAD STAPLER 3.5X60 BLU DVNC (STAPLE) ×1 IMPLANT
RETRACTOR WOUND ALXS 18CM MED (MISCELLANEOUS) IMPLANT
RTRCTR WOUND ALEXIS O 18CM MED (MISCELLANEOUS) ×2
SEAL CANN UNIV 5-8 DVNC XI (MISCELLANEOUS) ×4 IMPLANT
SEAL XI 5MM-8MM UNIVERSAL (MISCELLANEOUS) ×8
SEALER VESSEL DA VINCI XI (MISCELLANEOUS) ×2
SEALER VESSEL EXT DVNC XI (MISCELLANEOUS) IMPLANT
SET TRI-LUMEN FLTR TB AIRSEAL (TUBING) ×2 IMPLANT
SOL PREP PVP 2OZ (MISCELLANEOUS) ×2
SOLUTION ELECTROLUBE (MISCELLANEOUS) ×2 IMPLANT
SOLUTION PREP PVP 2OZ (MISCELLANEOUS) ×1 IMPLANT
SPONGE DRAIN TRACH 4X4 STRL 2S (GAUZE/BANDAGES/DRESSINGS) ×2 IMPLANT
SPONGE T-LAP 18X18 ~~LOC~~+RFID (SPONGE) ×4 IMPLANT
STAPLER 60 DA VINCI SURE FORM (STAPLE) ×2
STAPLER 60 SUREFORM DVNC (STAPLE) IMPLANT
STAPLER CIRCULAR MANUAL XL 33 (STAPLE) ×1 IMPLANT
STAPLER RELOAD 3.5X60 BLU DVNC (STAPLE) ×1
STAPLER RELOAD 3.5X60 BLUE (STAPLE) ×2
STAPLER RELOADABLE 65 2-0 SUT (MISCELLANEOUS) IMPLANT
STAPLER SKIN PROX 35W (STAPLE) ×1 IMPLANT
STAPLER SYS INTERNAL RELOAD SS (MISCELLANEOUS) ×2 IMPLANT
SURGILUBE 2OZ TUBE FLIPTOP (MISCELLANEOUS) ×2 IMPLANT
SUT ETHILON 3 0 PS 1 (SUTURE) ×1 IMPLANT
SUT MNCRL AB 4-0 PS2 18 (SUTURE) ×3 IMPLANT
SUT PDS AB 1 CT1 36 (SUTURE) ×6 IMPLANT
SUT SILK 2 0 (SUTURE) ×2
SUT SILK 2-0 30XBRD TIE 12 (SUTURE) IMPLANT
SUT SILK 3 0 (SUTURE) ×2
SUT SILK 3 0 SH 30 (SUTURE) ×1 IMPLANT
SUT SILK 3-0 18XBRD TIE 12 (SUTURE) IMPLANT
SUT VIC AB 3-0 SH 27 (SUTURE) ×4
SUT VIC AB 3-0 SH 27X BRD (SUTURE) ×2 IMPLANT
SYR 30ML LL (SYRINGE) ×4 IMPLANT
SYS LAPSCP GELPORT 120MM (MISCELLANEOUS) ×2
SYSTEM LAPSCP GELPORT 120MM (MISCELLANEOUS) ×1 IMPLANT
TOWEL OR 17X26 4PK STRL BLUE (TOWEL DISPOSABLE) ×1 IMPLANT
TRAY FOLEY MTR SLVR 16FR STAT (SET/KITS/TRAYS/PACK) ×2 IMPLANT

## 2022-02-01 NOTE — Anesthesia Procedure Notes (Signed)
Procedure Name: Intubation Date/Time: 02/01/2022 8:03 AM  Performed by: Cammie Sickle, CRNAPre-anesthesia Checklist: Patient identified, Patient being monitored, Timeout performed, Emergency Drugs available and Suction available Patient Re-evaluated:Patient Re-evaluated prior to induction Oxygen Delivery Method: Circle system utilized Preoxygenation: Pre-oxygenation with 100% oxygen Induction Type: IV induction Ventilation: Mask ventilation without difficulty Laryngoscope Size: Mac and 3 Grade View: Grade I Tube type: Oral Tube size: 7.0 mm Number of attempts: 1 Airway Equipment and Method: Stylet Placement Confirmation: ETT inserted through vocal cords under direct vision, positive ETCO2 and breath sounds checked- equal and bilateral Secured at: 22 cm Tube secured with: Tape Dental Injury: Teeth and Oropharynx as per pre-operative assessment

## 2022-02-01 NOTE — Anesthesia Postprocedure Evaluation (Signed)
Anesthesia Post Note  Patient: Patrick Elliott  Procedure(s) Performed: XI ROBOTIC ASSISTED COLOSTOMY TAKEDOWN (Abdomen)  Patient location during evaluation: PACU Anesthesia Type: General Level of consciousness: awake and alert Pain management: pain level controlled Vital Signs Assessment: post-procedure vital signs reviewed and stable Respiratory status: spontaneous breathing, nonlabored ventilation and respiratory function stable Cardiovascular status: blood pressure returned to baseline and stable Postop Assessment: no apparent nausea or vomiting Anesthetic complications: no   No notable events documented.   Last Vitals:  Vitals:   02/01/22 1430 02/01/22 1445  BP: 140/71 140/71  Pulse: 63 66  Resp: 10 11  Temp:    SpO2: 99% 98%    Last Pain:  Vitals:   02/01/22 1445  TempSrc:   PainSc: Asleep                 [place] Ouch

## 2022-02-01 NOTE — Op Note (Signed)
Preoperative diagnosis: End colostomy status.  Postoperative diagnosis: End Colostomy Status.   Procedure: Robotic assisted Laparoscopic colostomy reversal  Anesthesia: GETA  Surgeon: Lysle Pearl Assistant: Windell Moment for better exposure and anastomosis creation  Specimen: Anastomosis rings  Complications: omental bleeding requiring creation of upper laparotomy incision, plt transfusion  EBL: 717m  Wound Classification: Clean Contaminated  Indications:  See H&P.  Patient presents today for take down of his Hartmann's pouch.   Description of procedure: The patient was taken to the operating room and placed in the supine position. General endotracheal anesthesia was induced without any difficulty. A time-out was completed verifying correct patient, procedure, site, positioning, and implant(s) and/or special equipment prior to beginning this procedure. Both upper extremities were tucked.  A Foley catheter and an orogastric tube were placed. Preoperative antibiotics were given. The patient was re-positioned in the dorsal lithotomy position with stirrups and care was taken to pad all pressure points. The colostomy was covered with 4 x 4 gauze and  Tegaderm to prevent fecal contamination of the surgical field. The patient's abdomen and perineum were then prepped and draped.  Veress needle was placed through Palmer's point and insufflation initiated after 2 clicks heard and palpated, positive saline drop test.  Abdomen was insufflated 15 mmHg and the patient tolerated well.  Veress needle removed and a 8 mm robotic port was placed via the Optiview technique in the right upper quadrant.  Visualization of the abdominal cavity noted no injuries during trocar placement where Veress needle was inserted.  3 additional 8 mm ports were placed 8 cm apart extending towards the right lower quadrant under direct visualization.  All port sites were infused with local prior to trocar placement.  Xi system then  docked.  Attention was then directed towards the pelvic region, where there were some dense peritoneal attachments noted around a visible Prolene suture.  Dissection attempted but there adhesion too dense right at previous staple line,  so blue load stapler used to transect this area to create a new fresh staple line with minimal peritonization.  Attention was turned towards the proximal colon, where additional dissection was carried out along the white line of Toldt  to free up the colostomy as much as possible.  The adjacent omentum was released from within the parastomal hernia. The robot then undocked.  Attention was turned toward taking down the colostomy.  Abdomen desufflated and an elliptical incision encompassing the colostomy was made using the electrocautery and deepened through the subcutaneous and surrounding fascia. Once the proximal colon was freed from the fascia and reducible into the abdomen, a pursestring device was used to transect the end, and the anvil portion of a 33 mm EEA stapler was secured in place.  The anastomotic edges of the anvil was then cleared prior to reducing the colon back into the abdominal cavity.  Excess parastomal hernia sac removed, and then GelPort was placed through the ostomy site in order to maintain pneumoperitoneum, insufflation was then resumed, and the robot was redocked in the initial position.  Under laparoscopic view, additional dissection was carried out along the omentum and transverse mesocolon to achieve additional slack for the proximal colon to easily reach down adjacent to the rectal stump.  ICG infused and confirmed adequate profusion at stump and proximal end. The EEA stapler was advanced carefully by an assistant from below.  The antimesenteric boarders were aligned and proximal colon confirmed to not be twisted.  The EEA stapler was attached to the anvil and fired. The  stapler was removed and the rings of tissue retained in the stapler were  inspected and found to be complete with intact rings. The anastomosis was tested for patency and integrity by filling the abdomen with saline and insufflating the rectum with air while occluding the colon proximal to the anastomosis. No bubbles were noted and air was seen to pass across the anastomosis proximally.  Additional attachment along lateral aspect removed to decrease tension on staple line further.  Inspection of the LUQ after dissection completed noted very large amount pooling blood.  Suction completed and visualization of the source of bleeding was attempted but the bleeding was too brisk for laparoscopic suctioning.  Decision made at this point to convert to an open procedure for adequate control of the unknown source of bleeding at this time.  Robot was undocked quickly and initially the colostomy incision was extended laterally to obtain better visualization.  This unfortunately proved unsuccessful.  Therefore, a subxiphoid midline incision was created to gain better exposure to the left upper quadrant.  This additional incision allowed visualization of the pulsatile bleeding from one of the arteries on the omentum that had adhesions to the left upper quadrant.  The bleeding artery was suture-ligated by clamping it off and using 3-0 silk ties x2.  Visualization of the visible splenic capsule did not show any signs of injury, and no large amount of pooling of blood was noted after the ligation was completed.  Extensive irrigation of the abdomen was done and serosanguineous return was noted.  Last inspection of the the areas of dissection along the lateral wall did not note any signs of active bleeding.  No signs of active bleeding were noted along the incision lines or the former ostomy site.  Due to his borderline platelet count at the beginning of the surgery platelets were transfused Intra-Op.  The abdomen was copiously irrigated with normal saline.  Hemostasis was checked on last time.  1 JP  drain was placed through the assistant port site in the left upper quadrant and placed along the lateral dissection site, secured to the skin using 3-0 nylon.  Vistaseal was applied to the staple line which was still under minimal tension after control of the bleeding.  A second 20 French round drain was placed through the right lower quadrant abdominal wall around the pelvic site and anastomosis.  This drain was also secured to the skin using 3-0 nylon.  Exparel was infused as a tap block through the entire abdomen.  The midline incision and the former ostomy site fascia closed with 0-PDS x2 per site. The overlying skin at ostomy site was approximated with 3-0 Vicryl and then loosely approximated with staples.  Iodoform packing strips were then placed in between the staples.  Midline incision approximated using 3-0 Vicryl and closed with staples along with all remaining robotic port sites as well.  NG tube was placed prior to fascial closure in anticipation for possible ileus from the bleeding issue, noted to be in proper positioning by palpation.  The patient tolerated the procedure well and was extubated and taken to the postoperative care unit in stable condition accompanied by the surgical and anesthesia teams.  Foley catheter was continued and NG in place.

## 2022-02-01 NOTE — Transfer of Care (Signed)
Immediate Anesthesia Transfer of Care Note  Patient: Patrick Elliott  Procedure(s) Performed: XI ROBOTIC ASSISTED COLOSTOMY TAKEDOWN (Abdomen)  Patient Location: PACU  Anesthesia Type:General  Level of Consciousness: drowsy  Airway & Oxygen Therapy: Patient Spontanous Breathing and Patient connected to face mask oxygen  Post-op Assessment: Report given to RN and Post -op Vital signs reviewed and stable  Post vital signs: Reviewed and stable  Last Vitals:  Vitals Value Taken Time  BP 114/69 02/01/22 1351  Temp    Pulse 64 02/01/22 1355  Resp 11 02/01/22 1355  SpO2 100 % 02/01/22 1355  Vitals shown include unvalidated device data.  Last Pain:  Vitals:   02/01/22 0716  TempSrc: Oral  PainSc: 8          Complications: No notable events documented.

## 2022-02-01 NOTE — Interval H&P Note (Signed)
No change. OK to proceed.

## 2022-02-01 NOTE — Anesthesia Preprocedure Evaluation (Addendum)
Anesthesia Evaluation  Patient identified by MRN, date of birth, ID band Patient awake    Reviewed: Allergy & Precautions, NPO status , Patient's Chart, lab work & pertinent test results  Airway Mallampati: III  TM Distance: >3 FB Neck ROM: full    Dental  (+) Chipped   Pulmonary former smoker,    Pulmonary exam normal        Cardiovascular Exercise Tolerance: Good hypertension, +CHF (HFpEF)  Normal cardiovascular exam     Neuro/Psych PSYCHIATRIC DISORDERS Depression Bipolar Disorder Attention deficit hyperactivity disorder (ADHD) Bipolar disorder (HCC) Bipolar disorder in full remission  PERIPHERAL NEUROPATHY    GI/Hepatic GERD  Controlled,(+) Cirrhosis       , H/o substance abuse S/p hartman's for ischemic colitis   Endo/Other  negative endocrine ROSdiabetes, Insulin Dependent  Renal/GU      Musculoskeletal  (+) Arthritis ,   Abdominal Normal abdominal exam  (+)   Peds  Hematology thrombocytopenia   Anesthesia Other Findings Past Medical History: No date: Anemia No date: Anxiety No date: CHF (congestive heart failure) (HCC)     Comment:  DIASTOLIC No date: Cirrhosis ([place]) No date: Depression No date: Diabetes mellitus No date: Dizziness No date: Heart murmur No date: Hypertension No date: Neuromuscular disorder ([place]) No date: Syncope and collapse No date: Tachycardia  Past Surgical History: 04/08/2021: COLON RESECTION SIGMOID     Comment:  Procedure: COLON RESECTION SIGMOID;  Surgeon: Benjamine Sprague, DO;  Location: [facility];  Service: General;; No date: COLON SURGERY 01/27/2022: COLONOSCOPY WITH PROPOFOL; N/A     Comment:  Procedure: COLONOSCOPY WITH PROPOFOL;  Surgeon: Benjamine Sprague, DO;  Location: [facility];  Service: General;               Laterality: N/A; 2001: KNEE SURGERY; Right     Comment:  ACL repair 04/08/2021: LAPAROSCOPY; N/A     Comment:  Procedure:  LAPAROSCOPY DIAGNOSTIC;  Surgeon: Benjamine Sprague, DO;  Location: [facility];  Service: General;                Laterality: N/A;  CONVERTED TO OPEN SIGMOID COLON               RESCTION 1986: LEG SURGERY; Bilateral 04/08/2021: OSTOMY     Comment:  Procedure: OSTOMY CREATION;  Surgeon: Benjamine Sprague, DO;               Location: [facility];  Service: General;;     Reproductive/Obstetrics negative OB ROS                           Anesthesia Physical Anesthesia Plan  ASA: 2  Anesthesia Plan: General ETT   Post-op Pain Management: Tylenol PO (pre-op)* and Celebrex PO (pre-op)*   Induction: Intravenous  PONV Risk Score and Plan: 2 and Ondansetron, Dexamethasone, Midazolam and Treatment may vary due to age or medical condition  Airway Management Planned: Oral ETT  Additional Equipment:   Intra-op Plan:   Post-operative Plan:   Informed Consent: I have reviewed the patients History and Physical, chart, labs and discussed the procedure including the risks, benefits and alternatives for the proposed anesthesia with the patient or authorized representative who has indicated his/her understanding and  acceptance.     Dental Advisory Given  Plan Discussed with: Anesthesiologist, CRNA and Surgeon  Anesthesia Plan Comments:        Anesthesia Quick Evaluation

## 2022-02-02 LAB — BPAM PLATELET PHERESIS
Blood Product Expiration Date: 202306152359
ISSUE DATE / TIME: 202306131157
Unit Type and Rh: 6200

## 2022-02-02 LAB — CBC
HCT: 32 % — ABNORMAL LOW (ref 39.0–52.0)
Hemoglobin: 10.2 g/dL — ABNORMAL LOW (ref 13.0–17.0)
MCH: 26.2 pg (ref 26.0–34.0)
MCHC: 31.9 g/dL (ref 30.0–36.0)
MCV: 82.1 fL (ref 80.0–100.0)
Platelets: 101 10*3/uL — ABNORMAL LOW (ref 150–400)
RBC: 3.9 MIL/uL — ABNORMAL LOW (ref 4.22–5.81)
RDW: 15.3 % (ref 11.5–15.5)
WBC: 6 10*3/uL (ref 4.0–10.5)
nRBC: 0 % (ref 0.0–0.2)

## 2022-02-02 LAB — BASIC METABOLIC PANEL
Anion gap: 5 (ref 5–15)
BUN: 20 mg/dL (ref 8–23)
CO2: 21 mmol/L — ABNORMAL LOW (ref 22–32)
Calcium: 7.6 mg/dL — ABNORMAL LOW (ref 8.9–10.3)
Chloride: 113 mmol/L — ABNORMAL HIGH (ref 98–111)
Creatinine, Ser: 0.95 mg/dL (ref 0.61–1.24)
GFR, Estimated: >60 mL/min (ref >60)
Glucose, Bld: 187 mg/dL — ABNORMAL HIGH (ref 70–99)
Potassium: 4.5 mmol/L (ref 3.5–5.1)
Sodium: 139 mmol/L (ref 135–145)

## 2022-02-02 LAB — PREPARE PLATELET PHERESIS: Unit division: 0

## 2022-02-02 LAB — GLUCOSE, CAPILLARY
Glucose-Capillary: 159 mg/dL — ABNORMAL HIGH (ref 70–99)
Glucose-Capillary: 133 mg/dL — ABNORMAL HIGH (ref 70–99)
Glucose-Capillary: 112 mg/dL — ABNORMAL HIGH (ref 70–99)
Glucose-Capillary: 95 mg/dL (ref 70–99)

## 2022-02-02 LAB — SURGICAL PATHOLOGY

## 2022-02-02 NOTE — Progress Notes (Signed)
Subjective:  CC: Patrick Elliott is a 61 y.o. male  Hospital stay day 1, 1 Day Post-Op colostomy reversal  HPI: No acute issues overnight.  ROS:  General: Denies weight loss, weight gain, fatigue, fevers, chills, and night sweats. Heart: Denies chest pain, palpitations, racing heart, irregular heartbeat, leg pain or swelling, and decreased activity tolerance. Respiratory: Denies breathing difficulty, shortness of breath, wheezing, cough, and sputum. GI: Denies change in appetite, heartburn, nausea, vomiting, constipation, diarrhea, and blood in stool. GU: Denies difficulty urinating, pain with urinating, urgency, frequency, blood in urine.   Objective:   Temp:  [97.7 F (36.5 C)-99.4 F (37.4 C)] 98.4 F (36.9 C) (06/14 1915) Pulse Rate:  [81-87] 87 (06/14 1915) Resp:  [18-20] 20 (06/14 1915) BP: (103-126)/(53-70) 126/67 (06/14 1915) SpO2:  [96 %-100 %] 96 % (06/14 1915)     Height: '5\' 7"'$  (170.2 cm) Weight: 79.8 kg BMI (Calculated): 27.55   Intake/Output this shift:   Intake/Output Summary (Last 24 hours) at 02/02/2022 1943 Last data filed at 02/02/2022 1650 Gross per 24 hour  Intake --  Output 2225 ml  Net -2225 ml    Constitutional :  alert, cooperative, and appears stated age  Respiratory:  clear to auscultation bilaterally  Cardiovascular:  regular rate and rhythm  Gastrointestinal: Soft, no guarding, some TTP around incisions, midline clean, former ostomy site with drainage as expected .  JP with both serosanguinous drainage, darker   Skin: Cool and moist.   Psychiatric: Normal affect, non-agitated, not confused       LABS:     Latest Ref Rng & Units 02/02/2022    5:28 AM 12/04/2021    6:01 AM 12/04/2021    1:20 AM  CMP  Glucose 70 - 99 mg/dL 187  125  126   BUN 8 - 23 mg/dL '20  13  14   '$ Creatinine 0.61 - 1.24 mg/dL 0.95  0.81  0.81   Sodium 135 - 145 mmol/L 139  138  138   Potassium 3.5 - 5.1 mmol/L 4.5  3.9  4.1   Chloride 98 - 111 mmol/L 113  109  108    CO2 22 - 32 mmol/L '21  24  24   '$ Calcium 8.9 - 10.3 mg/dL 7.6  8.8  8.8   Total Protein 6.5 - 8.1 g/dL  6.1  6.2   Total Bilirubin 0.3 - 1.2 mg/dL  1.4  1.4   Alkaline Phos 38 - 126 U/L  43  42   AST 15 - 41 U/L  20  21   ALT 0 - 44 U/L  25  26       Latest Ref Rng & Units 02/02/2022    5:28 AM 02/01/2022    8:00 PM 02/01/2022    7:48 AM  CBC  WBC 4.0 - 10.5 K/uL 6.0   3.0   Hemoglobin 13.0 - 17.0 g/dL 10.2  11.5  12.2   Hematocrit 39.0 - 52.0 % 32.0  35.5  37.1   Platelets 150 - 400 K/uL 101   83     RADS: N/a Assessment:   S/p ostomy reversal.  As expected from extensive surgery.  Continue NG in anticipation of ileus, monitor JP output.  Pain control  labs/images/medications/previous chart entries reviewed personally and relevant changes/updates noted above.

## 2022-02-02 NOTE — Inpatient Diabetes Management (Signed)
Inpatient Diabetes Program Recommendations  AACE/ADA: New Consensus Statement on Inpatient Glycemic Control (2015)  Target Ranges:  Prepandial:   less than 140 mg/dL      Peak postprandial:   less than 180 mg/dL (1-2 hours)      Critically ill patients:  140 - 180 mg/dL   Lab Results  Component Value Date   GLUCAP 159 (H) 02/02/2022   HGBA1C 7.4 (H) 12/04/2021    Review of Glycemic Control  Latest Reference Range & Units 02/01/22 07:17 02/01/22 13:57 02/01/22 17:26 02/01/22 21:18 02/02/22 08:09  Glucose-Capillary 70 - 99 mg/dL 189 (H) 286 (H) 242 (H) 215 (H) 159 (H)   Diabetes history: DM 2 Outpatient Diabetes medications: Fiasp 16-20 units bid, then up to 26 units for carbohydrate coverage, Lantus 24 units, Jardiance 25 mg Daily Current orders for Inpatient glycemic control:  Novolog 0-15 units tid Novolog 4 units tid meal coverage Semglee 40 units Daily  Inpatient Diabetes Program Recommendations:    Pt currently NPO. -   Change Novolog Correction scale q4 hour coverage  -  unless pt is on tube feeds or has diet ordered, please d/c Novolog 4 units tid.  Thanks,  Tama Headings RN, MSN, BC-ADM Inpatient Diabetes Coordinator Team Pager [phone] (8a-5p)

## 2022-02-02 NOTE — TOC Initial Note (Signed)
Transition of Care [facility]) - Initial/Assessment Note    Patient Details  Name: THOMA PAULSEN MRN: [id number] Date of Birth: [birth year]  [facility]) CM/SW Contact:    Beverly Sessions, RN Phone Number: 02/02/2022, 10:51 AM  Clinical Narrative:                   Transition of Care [facility]) Screening Note   Patient Details  Name: TYSEAN VANDERVLIET Date of Birth: 11-26-1960   Transition of Care [facility]) CM/SW Contact:    Beverly Sessions, RN Phone Number: 02/02/2022, 10:51 AM    Transition of Care Department [facility]) has reviewed patient and no TOC needs have been identified at this time. We will continue to monitor patient advancement through interdisciplinary progression rounds. If new patient transition needs arise, please place a TOC consult.          Patient Goals and CMS Choice        Expected Discharge Plan and Services                                                Prior Living Arrangements/Services                       Activities of Daily Living Home Assistive Devices/Equipment: Cane (specify quad or straight) ADL Screening (condition at time of admission) Patient's cognitive ability adequate to safely complete daily activities?: Yes Is the patient deaf or have difficulty hearing?: No Does the patient have difficulty seeing, even when wearing glasses/contacts?: No Does the patient have difficulty concentrating, remembering, or making decisions?: No Patient able to express need for assistance with ADLs?: Yes Does the patient have difficulty dressing or bathing?: No Independently performs ADLs?: Yes (appropriate for developmental age) Does the patient have difficulty walking or climbing stairs?: No Weakness of Legs: None Weakness of Arms/Hands: Both  Permission Sought/Granted                  Emotional Assessment              Admission diagnosis:  Colostomy in place [facility]) [Z93.3] Patient Active Problem List    Diagnosis Date Noted   Colostomy in place [facility]) 02/01/2022   Bipolar disorder in full remission ([place]) 12/27/2021   Delirium due to medical condition with behavioral disturbance 12/23/2021   AMS (altered mental status) 12/03/2021   Ischemic colitis ([place]) 04/13/2021   Peritonitis ([place]) 04/13/2021   Colostomy status ([place]) 04/13/2021   Cirrhosis ([place]) 04/10/2021   Severe sepsis with lactic acidosis ([place]) 04/08/2021   Seborrheic keratosis 03/07/2020   Post-traumatic osteoarthritis of right knee 10/10/2018   Primary insomnia 02/08/2018   Healthcare maintenance 08/19/2016   Urethral pain 12/05/2014   Thoracic back pain 05/30/2013   Chronic venous insufficiency 05/30/2013   Chronic diastolic congestive heart failure ([place]) 10/18/2012   Arthritis of midfoot 09/09/2010   UNEQUAL LEG LENGTH 08/05/2010   ABNORMALITY OF GAIT 08/05/2010   PERIPHERAL NEUROPATHY 06/16/2010   GERD 05/19/2010   Attention deficit hyperactivity disorder (ADHD) 04/14/2010   Bipolar disorder ([place]) 12/24/2008   ANXIETY DISORDER 08/26/2008   Hypercholesteremia 05/23/2008   HYPERTENSION, BENIGN ESSENTIAL 02/20/2007   Diabetes mellitus with neuropathy ([place]) 10/19/2006   ALCOHOL ABUSE, HX OF 10/19/2006   Nondependent alcohol abuse, in remission 10/19/2006   PCP:  Molli Barrows  Chauncey Cruel, MD Pharmacy:   [id number] [place] [place] [id number]: [phone]: [phone]    Social Determinants of Health (SDOH) Interventions    Readmission Risk Interventions    04/10/2021   12:03 PM  Readmission Risk Prevention Plan  Transportation Screening Complete  PCP or Specialist Appt within 3-5 Days Complete  HRI or [place]  Social Work Consult for [place]/Counseling Complete  Palliative Care Screening Not Applicable  Medication Review (Press photographer Complete

## 2022-02-03 LAB — CBC
HCT: 31.4 % — ABNORMAL LOW (ref 39.0–52.0)
Hemoglobin: 9.9 g/dL — ABNORMAL LOW (ref 13.0–17.0)
MCH: 26.3 pg (ref 26.0–34.0)
MCHC: 31.5 g/dL (ref 30.0–36.0)
MCV: 83.5 fL (ref 80.0–100.0)
Platelets: 65 10*3/uL — ABNORMAL LOW (ref 150–400)
RBC: 3.76 MIL/uL — ABNORMAL LOW (ref 4.22–5.81)
RDW: 15.3 % (ref 11.5–15.5)
WBC: 3.3 10*3/uL — ABNORMAL LOW (ref 4.0–10.5)
nRBC: 0 % (ref 0.0–0.2)

## 2022-02-03 LAB — BASIC METABOLIC PANEL
Anion gap: 2 — ABNORMAL LOW (ref 5–15)
BUN: 14 mg/dL (ref 8–23)
CO2: 22 mmol/L (ref 22–32)
Calcium: 7.7 mg/dL — ABNORMAL LOW (ref 8.9–10.3)
Chloride: 113 mmol/L — ABNORMAL HIGH (ref 98–111)
Creatinine, Ser: 0.64 mg/dL (ref 0.61–1.24)
GFR, Estimated: >60 mL/min (ref >60)
Glucose, Bld: 101 mg/dL — ABNORMAL HIGH (ref 70–99)
Potassium: 3.6 mmol/L (ref 3.5–5.1)
Sodium: 137 mmol/L (ref 135–145)

## 2022-02-03 LAB — GLUCOSE, CAPILLARY
Glucose-Capillary: 96 mg/dL (ref 70–99)
Glucose-Capillary: 101 mg/dL — ABNORMAL HIGH (ref 70–99)
Glucose-Capillary: 99 mg/dL (ref 70–99)
Glucose-Capillary: 69 mg/dL — ABNORMAL LOW (ref 70–99)
Glucose-Capillary: 119 mg/dL — ABNORMAL HIGH (ref 70–99)

## 2022-02-03 MED ORDER — INSULIN ASPART 100 UNIT/ML IJ SOLN
0.0000 [IU] | INTRAMUSCULAR | Status: DC
  Filled 2022-02-03: qty 1

## 2022-02-03 MED ORDER — SODIUM CHLORIDE 0.9% IV SOLUTION
Freq: Once | INTRAVENOUS | Status: AC
Start: 2022-02-03 — End: 2022-02-03

## 2022-02-03 NOTE — Plan of Care (Signed)

## 2022-02-03 NOTE — Plan of Care (Signed)
Problem: Education: Goal: Ability to describe self-care measures that may prevent or decrease complications (Diabetes Survival Skills Education) will improve 02/03/2022 0538 by Rosalie Gums, RN Outcome: Progressing 02/03/2022 0535 by Rosalie Gums, RN Outcome: Progressing Goal: Individualized Educational Video(s) 02/03/2022 0538 by Rosalie Gums, RN Outcome: Progressing 02/03/2022 0535 by Rosalie Gums, RN Outcome: Progressing   Problem: Coping: Goal: Ability to adjust to condition or change in health will improve 02/03/2022 0538 by Rosalie Gums, RN Outcome: Progressing 02/03/2022 0535 by Rosalie Gums, RN Outcome: Progressing   Problem: Fluid Volume: Goal: Ability to maintain a balanced intake and output will improve 02/03/2022 0538 by Rosalie Gums, RN Outcome: Progressing 02/03/2022 0535 by Rosalie Gums, RN Outcome: Progressing   Problem: Health Behavior/Discharge Planning: Goal: Ability to identify and utilize available resources and services will improve 02/03/2022 0538 by Rosalie Gums, RN Outcome: Progressing 02/03/2022 0535 by Rosalie Gums, RN Outcome: Progressing Goal: Ability to manage health-related needs will improve 02/03/2022 0538 by Rosalie Gums, RN Outcome: Progressing 02/03/2022 0535 by Rosalie Gums, RN Outcome: Progressing   Problem: Metabolic: Goal: Ability to maintain appropriate glucose levels will improve 02/03/2022 0538 by Rosalie Gums, RN Outcome: Progressing 02/03/2022 0535 by Rosalie Gums, RN Outcome: Progressing   Problem: Nutritional: Goal: Maintenance of adequate nutrition will improve 02/03/2022 0538 by Rosalie Gums, RN Outcome: Progressing 02/03/2022 0535 by Rosalie Gums, RN Outcome: Progressing Goal: Progress toward achieving an optimal weight will improve 02/03/2022 0538 by Rosalie Gums, RN Outcome: Progressing 02/03/2022 0535 by Rosalie Gums, RN Outcome: Progressing   Problem:  Skin Integrity: Goal: Risk for impaired skin integrity will decrease 02/03/2022 0538 by Rosalie Gums, RN Outcome: Progressing 02/03/2022 0535 by Rosalie Gums, RN Outcome: Progressing   Problem: Tissue Perfusion: Goal: Adequacy of tissue perfusion will improve 02/03/2022 0538 by Rosalie Gums, RN Outcome: Progressing 02/03/2022 0535 by Rosalie Gums, RN Outcome: Progressing   Problem: Education: Goal: Knowledge of General Education information will improve Description: Including pain rating scale, medication(s)/side effects and non-pharmacologic comfort measures 02/03/2022 0538 by Rosalie Gums, RN Outcome: Progressing 02/03/2022 0535 by Rosalie Gums, RN Outcome: Progressing   Problem: Health Behavior/Discharge Planning: Goal: Ability to manage health-related needs will improve 02/03/2022 0538 by Rosalie Gums, RN Outcome: Progressing 02/03/2022 0535 by Rosalie Gums, RN Outcome: Progressing   Problem: Clinical Measurements: Goal: Ability to maintain clinical measurements within normal limits will improve 02/03/2022 0538 by Rosalie Gums, RN Outcome: Progressing 02/03/2022 0535 by Rosalie Gums, RN Outcome: Progressing Goal: Will remain free from infection 02/03/2022 0538 by Rosalie Gums, RN Outcome: Progressing 02/03/2022 0535 by Rosalie Gums, RN Outcome: Progressing Goal: Diagnostic test results will improve 02/03/2022 0538 by Rosalie Gums, RN Outcome: Progressing 02/03/2022 0535 by Rosalie Gums, RN Outcome: Progressing Goal: Respiratory complications will improve 02/03/2022 0538 by Rosalie Gums, RN Outcome: Progressing 02/03/2022 0535 by Rosalie Gums, RN Outcome: Progressing Goal: Cardiovascular complication will be avoided 02/03/2022 0538 by Rosalie Gums, RN Outcome: Progressing 02/03/2022 0535 by Rosalie Gums, RN Outcome: Progressing   Problem: Activity: Goal: Risk for activity intolerance will  decrease 02/03/2022 0538 by Rosalie Gums, RN Outcome: Progressing 02/03/2022 0535 by Rosalie Gums, RN Outcome: Progressing   Problem: Nutrition: Goal: Adequate nutrition will be maintained 02/03/2022 0538 by Rosalie Gums, RN Outcome: Progressing 02/03/2022 0535 by Rosalie Gums, RN Outcome: Progressing  Problem: Coping: Goal: Level of anxiety will decrease 02/03/2022 0538 by Rosalie Gums, RN Outcome: Progressing 02/03/2022 0535 by Rosalie Gums, RN Outcome: Progressing   Problem: Elimination: Goal: Will not experience complications related to bowel motility 02/03/2022 0538 by Rosalie Gums, RN Outcome: Progressing 02/03/2022 0535 by Rosalie Gums, RN Outcome: Progressing Goal: Will not experience complications related to urinary retention 02/03/2022 0538 by Rosalie Gums, RN Outcome: Progressing 02/03/2022 0535 by Rosalie Gums, RN Outcome: Progressing   Problem: Pain Managment: Goal: General experience of comfort will improve 02/03/2022 0538 by Rosalie Gums, RN Outcome: Progressing 02/03/2022 0535 by Rosalie Gums, RN Outcome: Progressing   Problem: Safety: Goal: Ability to remain free from injury will improve 02/03/2022 0538 by Rosalie Gums, RN Outcome: Progressing 02/03/2022 0535 by Rosalie Gums, RN Outcome: Progressing   Problem: Skin Integrity: Goal: Risk for impaired skin integrity will decrease 02/03/2022 0538 by Rosalie Gums, RN Outcome: Progressing 02/03/2022 0535 by Rosalie Gums, RN Outcome: Progressing

## 2022-02-04 LAB — CBC
HCT: 31.8 % — ABNORMAL LOW (ref 39.0–52.0)
Hemoglobin: 10 g/dL — ABNORMAL LOW (ref 13.0–17.0)
MCH: 26.3 pg (ref 26.0–34.0)
MCHC: 31.4 g/dL (ref 30.0–36.0)
MCV: 83.7 fL (ref 80.0–100.0)
Platelets: 72 10*3/uL — ABNORMAL LOW (ref 150–400)
RBC: 3.8 MIL/uL — ABNORMAL LOW (ref 4.22–5.81)
RDW: 15.2 % (ref 11.5–15.5)
WBC: 3.1 10*3/uL — ABNORMAL LOW (ref 4.0–10.5)
nRBC: 0 % (ref 0.0–0.2)

## 2022-02-04 LAB — GLUCOSE, CAPILLARY
Glucose-Capillary: 100 mg/dL — ABNORMAL HIGH (ref 70–99)
Glucose-Capillary: 86 mg/dL (ref 70–99)
Glucose-Capillary: 91 mg/dL (ref 70–99)
Glucose-Capillary: 86 mg/dL (ref 70–99)
Glucose-Capillary: 80 mg/dL (ref 70–99)
Glucose-Capillary: 275 mg/dL — ABNORMAL HIGH (ref 70–99)

## 2022-02-04 LAB — BASIC METABOLIC PANEL
Anion gap: 3 — ABNORMAL LOW (ref 5–15)
BUN: 14 mg/dL (ref 8–23)
CO2: 23 mmol/L (ref 22–32)
Calcium: 8 mg/dL — ABNORMAL LOW (ref 8.9–10.3)
Chloride: 115 mmol/L — ABNORMAL HIGH (ref 98–111)
Creatinine, Ser: 0.66 mg/dL (ref 0.61–1.24)
GFR, Estimated: >60 mL/min (ref >60)
Glucose, Bld: 83 mg/dL (ref 70–99)
Potassium: 3.9 mmol/L (ref 3.5–5.1)
Sodium: 141 mmol/L (ref 135–145)

## 2022-02-04 LAB — BPAM PLATELET PHERESIS
Blood Product Expiration Date: 202306152359
ISSUE DATE / TIME: 202306151608
Unit Type and Rh: 5100

## 2022-02-04 LAB — PREPARE PLATELET PHERESIS: Unit division: 0

## 2022-02-04 MED ORDER — ENOXAPARIN SODIUM 40 MG/0.4ML IJ SOSY
40.0000 mg | PREFILLED_SYRINGE | INTRAMUSCULAR | Status: DC
  Administered 2022-02-04 – 2022-02-07 (×4): 40 mg via SUBCUTANEOUS
  Filled 2022-02-04 (×4): qty 0.4

## 2022-02-04 MED ORDER — SIMETHICONE 80 MG PO CHEW
80.0000 mg | CHEWABLE_TABLET | Freq: Four times a day (QID) | ORAL | Status: DC
  Administered 2022-02-04 – 2022-02-07 (×14): 80 mg via ORAL
  Filled 2022-02-04 (×14): qty 1

## 2022-02-04 MED ORDER — HYDROMORPHONE HCL 1 MG/ML IJ SOLN
0.5000 mg | INTRAMUSCULAR | Status: DC | PRN
  Administered 2022-02-05 – 2022-02-07 (×2): 0.5 mg via INTRAVENOUS
  Filled 2022-02-04 (×2): qty 0.5

## 2022-02-04 MED ORDER — OXYCODONE HCL 5 MG PO TABS
5.0000 mg | ORAL_TABLET | ORAL | Status: DC | PRN
  Administered 2022-02-05 – 2022-02-06 (×3): 10 mg via ORAL
  Administered 2022-02-06: 5 mg via ORAL
  Administered 2022-02-07: 10 mg via ORAL
  Filled 2022-02-04: qty 2
  Filled 2022-02-04: qty 1
  Filled 2022-02-04 (×3): qty 2

## 2022-02-04 MED ORDER — ACETAMINOPHEN 325 MG PO TABS: 650.0000 mg | ORAL_TABLET | Freq: Four times a day (QID) | ORAL | Status: DC | PRN

## 2022-02-04 MED ORDER — INSULIN ASPART 100 UNIT/ML IJ SOLN
0.0000 [IU] | Freq: Three times a day (TID) | INTRAMUSCULAR | Status: DC
  Administered 2022-02-04 – 2022-02-05 (×2): 8 [IU] via SUBCUTANEOUS
  Administered 2022-02-05: 5 [IU] via SUBCUTANEOUS
  Administered 2022-02-05: 2 [IU] via SUBCUTANEOUS
  Administered 2022-02-05: 5 [IU] via SUBCUTANEOUS
  Administered 2022-02-06: 15 [IU] via SUBCUTANEOUS
  Administered 2022-02-06: 5 [IU] via SUBCUTANEOUS
  Administered 2022-02-06: 3 [IU] via SUBCUTANEOUS
  Administered 2022-02-06: 8 [IU] via SUBCUTANEOUS
  Administered 2022-02-07: 3 [IU] via SUBCUTANEOUS
  Administered 2022-02-07: 15 [IU] via SUBCUTANEOUS
  Filled 2022-02-04 (×11): qty 1

## 2022-02-04 MED ORDER — INSULIN GLARGINE-YFGN 100 UNIT/ML ~~LOC~~ SOLN
20.0000 [IU] | Freq: Every day | SUBCUTANEOUS | Status: DC
  Administered 2022-02-04 – 2022-02-06 (×3): 20 [IU] via SUBCUTANEOUS
  Filled 2022-02-04 (×4): qty 0.2

## 2022-02-04 NOTE — Progress Notes (Addendum)
Subjective:  CC: Patrick Elliott is a 61 y.o. male  Hospital stay day 3, 3 Days Post-Op colostomy reversal  HPI: No acute issues overnight.  ROS:  General: Denies weight loss, weight gain, fatigue, fevers, chills, and night sweats. Heart: Denies chest pain, palpitations, racing heart, irregular heartbeat, leg pain or swelling, and decreased activity tolerance. Respiratory: Denies breathing difficulty, shortness of breath, wheezing, cough, and sputum. GI: Denies change in appetite, heartburn, nausea, vomiting, constipation, diarrhea, and blood in stool. GU: Denies difficulty urinating, pain with urinating, urgency, frequency, blood in urine.   Objective:   Temp:  [97.9 F (36.6 C)-98.6 F (37 C)] 97.9 F (36.6 C) (06/16 0803) Pulse Rate:  [64-74] 65 (06/16 0803) Resp:  [16-20] 16 (06/16 0803) BP: (103-137)/(60-73) 103/60 (06/16 0803) SpO2:  [95 %-99 %] 95 % (06/16 0803)     Height: '5\' 7"'$  (170.2 cm) Weight: 79.8 kg BMI (Calculated): 27.55   Intake/Output this shift:   Intake/Output Summary (Last 24 hours) at 02/04/2022 0933 Last data filed at 02/04/2022 0558 Gross per 24 hour  Intake 437.5 ml  Output 1340 ml  Net -902.5 ml    Constitutional :  alert, cooperative, and appears stated age  Respiratory:  clear to auscultation bilaterally  Cardiovascular:  regular rate and rhythm  Gastrointestinal: Soft, no guarding, no TTP, midline clean, former ostomy site with drainage as expected .  Slightly more distended today. JP with both serosanguinous drainage, lighter in color   Skin: Cool and moist.   Psychiatric: Normal affect, non-agitated, not confused       LABS:     Latest Ref Rng & Units 02/04/2022    5:40 AM 02/03/2022    5:31 AM 02/02/2022    5:28 AM  CMP  Glucose 70 - 99 mg/dL 83  101  187   BUN 8 - 23 mg/dL '14  14  20   '$ Creatinine 0.61 - 1.24 mg/dL 0.66  0.64  0.95   Sodium 135 - 145 mmol/L 141  137  139   Potassium 3.5 - 5.1 mmol/L 3.9  3.6  4.5   Chloride 98 - 111  mmol/L 115  113  113   CO2 22 - 32 mmol/L '23  22  21   '$ Calcium 8.9 - 10.3 mg/dL 8.0  7.7  7.6       Latest Ref Rng & Units 02/04/2022    5:40 AM 02/03/2022    5:31 AM 02/02/2022    5:28 AM  CBC  WBC 4.0 - 10.5 K/uL 3.1  3.3  6.0   Hemoglobin 13.0 - 17.0 g/dL 10.0  9.9  10.2   Hematocrit 39.0 - 52.0 % 31.8  31.4  32.0   Platelets 150 - 400 K/uL 72  65  101     RADS: N/a Assessment:   S/p ostomy reversal.  As expected from extensive surgery.  Continue NG for ileus, monitor JP output.  Simethicone for distention.  Continue to encourage ambulation, gum, candy.  Lovenox restarted since no bleeding concerns now.  Also requested hematology consult for persistently low platelets.  Pt reports no hx of if prior, and chart check does not indicate any routine monitoring.  Likely from hx of cirrhosis  DM on SSI-  becoming more euglycemic, will half basal dose to prevent hypoglycemia while still NPO  labs/images/medications/previous chart entries reviewed personally and relevant changes/updates noted above.

## 2022-02-04 NOTE — Plan of Care (Signed)

## 2022-02-05 DIAGNOSIS — Z433 Encounter for attention to colostomy: Secondary | ICD-10-CM | POA: Diagnosis not present

## 2022-02-05 LAB — BPAM RBC
Blood Product Expiration Date: 202306292359
Blood Product Expiration Date: 202307152359
Blood Product Expiration Date: 202307152359
ISSUE DATE / TIME: 202306081310
Unit Type and Rh: 5100
Unit Type and Rh: 5100
Unit Type and Rh: 5100

## 2022-02-05 LAB — CBC
HCT: 32 % — ABNORMAL LOW (ref 39.0–52.0)
Hemoglobin: 10.6 g/dL — ABNORMAL LOW (ref 13.0–17.0)
MCH: 27.5 pg (ref 26.0–34.0)
MCHC: 33.1 g/dL (ref 30.0–36.0)
MCV: 82.9 fL (ref 80.0–100.0)
Platelets: 90 10*3/uL — ABNORMAL LOW (ref 150–400)
RBC: 3.86 MIL/uL — ABNORMAL LOW (ref 4.22–5.81)
RDW: 14.9 % (ref 11.5–15.5)
WBC: 3.6 10*3/uL — ABNORMAL LOW (ref 4.0–10.5)
nRBC: 0 % (ref 0.0–0.2)

## 2022-02-05 LAB — COMPREHENSIVE METABOLIC PANEL
ALT: 15 U/L (ref 0–44)
AST: 16 U/L (ref 15–41)
Albumin: 2.7 g/dL — ABNORMAL LOW (ref 3.5–5.0)
Alkaline Phosphatase: 38 U/L (ref 38–126)
Anion gap: 3 — ABNORMAL LOW (ref 5–15)
BUN: 9 mg/dL (ref 8–23)
CO2: 24 mmol/L (ref 22–32)
Calcium: 8.1 mg/dL — ABNORMAL LOW (ref 8.9–10.3)
Chloride: 113 mmol/L — ABNORMAL HIGH (ref 98–111)
Creatinine, Ser: 0.63 mg/dL (ref 0.61–1.24)
GFR, Estimated: >60 mL/min (ref >60)
Glucose, Bld: 138 mg/dL — ABNORMAL HIGH (ref 70–99)
Potassium: 3.4 mmol/L — ABNORMAL LOW (ref 3.5–5.1)
Sodium: 140 mmol/L (ref 135–145)
Total Bilirubin: 0.8 mg/dL (ref 0.3–1.2)
Total Protein: 5.3 g/dL — ABNORMAL LOW (ref 6.5–8.1)

## 2022-02-05 LAB — TYPE AND SCREEN
ABO/RH(D): A POS
Antibody Screen: NEGATIVE
Unit division: 0
Unit division: 0
Unit division: 0

## 2022-02-05 LAB — GLUCOSE, CAPILLARY
Glucose-Capillary: 136 mg/dL — ABNORMAL HIGH (ref 70–99)
Glucose-Capillary: 227 mg/dL — ABNORMAL HIGH (ref 70–99)
Glucose-Capillary: 207 mg/dL — ABNORMAL HIGH (ref 70–99)
Glucose-Capillary: 262 mg/dL — ABNORMAL HIGH (ref 70–99)

## 2022-02-05 MED ORDER — POTASSIUM CHLORIDE 20 MEQ PO PACK
40.0000 meq | PACK | Freq: Once | ORAL | Status: AC
  Administered 2022-02-05: 40 meq via ORAL
  Filled 2022-02-05: qty 2

## 2022-02-05 NOTE — Progress Notes (Addendum)
02/05/2022  Subjective: Patient is 4 Days Post-Op.  No acute events overnight.  Patient reports that he had a mushy or liquid bowel movement today.  Denies any worsening abdominal pain.  He has tolerated clear liquid diet which was started yesterday.  Denies any nausea.  Vital signs: Temp:  [97.9 F (36.6 C)-98.7 F (37.1 C)] 98.7 F (37.1 C) (06/17 0757) Pulse Rate:  [72-102] 72 (06/17 0757) Resp:  [18-19] 18 (06/17 0757) BP: (98-130)/(57-71) 112/63 (06/17 0757) SpO2:  [95 %-98 %] 96 % (06/17 0757)   Intake/Output: 06/16 0701 - 06/17 0700 In: -  Out: 600 [Emesis/NG output:500; Drains:100] Last BM Date : 02/05/22  Physical Exam: Constitutional: No acute distress Abdomen: Soft, with some abdominal distention.  Midline incision is clean, dry, intact.  Left lower quadrant ostomy site dressing changed today with dry gauze dressing between staples.  JP drains with serosanguineous drainage.  Labs:  Recent Labs    02/04/22 0540 02/05/22 0446  WBC 3.1* 3.6*  HGB 10.0* 10.6*  HCT 31.8* 32.0*  PLT 72* 90*   Recent Labs    02/04/22 0540 02/05/22 0446  NA 141 140  K 3.9 3.4*  CL 115* 113*  CO2 23 24  GLUCOSE 83 138*  BUN 14 9  CREATININE 0.66 0.63  CALCIUM 8.0* 8.1*   No results for input(s): "LABPROT", "INR" in the last 72 hours.  Imaging: No results found.  Assessment/Plan: This is a 61 y.o. male s/p robotic assisted colostomy reversal with upper abdominal laparotomy due to omental bleeding.  - Patient's hemoglobin is stable today and the drains are both serosanguineous.  Currently no evidence of other complications.  Platelet count is stable at 90. - Patient continues having bowel function with a bowel movement this morning.  We will advance the patient's diet to full liquid diet today with potential for soft diet tomorrow. - Continue dressing changes to the left lower quadrant wound. - Out of bed, ambulate.  I spent 35 minutes dedicated to the care of this patient  on the date of this encounter to include pre-visit review of records, face-to-face time with the patient discussing diagnosis and management, and any post-visit coordination of care.   Melvyn Neth, [facility]

## 2022-02-05 NOTE — Evaluation (Signed)
Physical Therapy Evaluation Patient Details Name: Patrick Elliott MRN: [id number] DOB: [birth year] Today's Date: 02/05/2022  History of Present Illness  Pt is a 61 y/o M, s/p robotic assisted colostmy reversal with upper abdominal laparotomy 2/2 omental bleeding.  Clinical Impression  Pt seen for PT evaluation with pt agreeable to tx. PT educated pt on log rolling for comfort & pt able to do so with mod I with HOB elevated & bed rails. Pt is able to ambulate into hallway & back with personal SPC with CGA & pt limited by fatigue. Pt reports he has friends/family that can assist at d/c. Recommending HHPT f/u.    Recommendations for follow up therapy are one component of a multi-disciplinary discharge planning process, led by the attending physician.  Recommendations may be updated based on patient status, additional functional criteria and insurance authorization.  Follow Up Recommendations Home health PT    Assistance Recommended at Discharge Intermittent Supervision/Assistance  Patient can return home with the following  A little help with walking and/or transfers;A little help with bathing/dressing/bathroom;Assistance with cooking/housework;Direct supervision/assist for medications management;Help with stairs or ramp for entrance    Equipment Recommendations None recommended by PT  Recommendations for Other Services       Functional Status Assessment Patient has had a recent decline in their functional status and demonstrates the ability to make significant improvements in function in a reasonable and predictable amount of time.     Precautions / Restrictions Precautions Precautions: Fall Restrictions Weight Bearing Restrictions: No      Mobility  Bed Mobility Overal bed mobility: Modified Independent Bed Mobility: Sidelying to Sit, Rolling Rolling: Modified independent (Device/Increase time) Sidelying to sit: Modified independent (Device/Increase time), HOB elevated        General bed mobility comments: use of bed rails PRN    Transfers Overall transfer level: Needs assistance Equipment used: Straight cane Transfers: Sit to/from Stand, Bed to chair/wheelchair/BSC Sit to Stand: Min guard   Step pivot transfers: Min guard (bed>recliner on R without AD)            Ambulation/Gait Ambulation/Gait assistance: Min guard Gait Distance (Feet): 80 Feet Assistive device: Straight cane Gait Pattern/deviations: Decreased weight shift to right Gait velocity: decreased        Stairs            Wheelchair Mobility    Modified Rankin (Stroke Patients Only)       Balance Overall balance assessment: Needs assistance   Sitting balance-Leahy Scale: Good     Standing balance support: No upper extremity supported, During functional activity Standing balance-Leahy Scale: Fair                               Pertinent Vitals/Pain Pain Assessment Pain Assessment: 0-10 Pain Score: 9  Pain Location: abdomen Pain Descriptors / Indicators: Discomfort Pain Intervention(s): Patient requesting pain meds-RN notified, Monitored during session    Home Living Family/patient expects to be discharged to:: Private residence Living Arrangements: Alone Available Help at Discharge: Family;Friend(s);Available PRN/intermittently Type of Home:  (condo) Home Access: Stairs to enter Entrance Stairs-Rails: Right;Left (wideset) Entrance Stairs-Number of Steps: 5   Home Layout: One level Home Equipment: Cane - single point;Shower Land (2 wheels)      Prior Function Prior Level of Function : Independent/Modified Independent             Mobility Comments: Pt denies falls in past 6 months.  Hand Dominance        Extremity/Trunk Assessment   Upper Extremity Assessment Upper Extremity Assessment: Overall WFL for tasks assessed    Lower Extremity Assessment Lower Extremity Assessment: RLE deficits/detail;Generalized  weakness RLE Deficits / Details: 2/5 knee extension in sitting, pt reports he needs a knee replacement       Communication   Communication: No difficulties  Cognition Arousal/Alertness: Awake/alert Behavior During Therapy: WFL for tasks assessed/performed Overall Cognitive Status: Within Functional Limits for tasks assessed                                          General Comments General comments (skin integrity, edema, etc.): Reviewed log rolling for comfort.    Exercises     Assessment/Plan    PT Assessment Patient needs continued PT services  PT Problem List Decreased strength;Pain;Cardiopulmonary status limiting activity;Decreased activity tolerance;Decreased balance;Decreased mobility;Decreased skin integrity       PT Treatment Interventions DME instruction;Therapeutic exercise;Balance training;Gait training;Stair training;Functional mobility training;Neuromuscular re-education;Therapeutic activities;Patient/family education    PT Goals (Current goals can be found in the Care Plan section)  Acute Rehab PT Goals Patient Stated Goal: decreased pain PT Goal Formulation: With patient Time For Goal Achievement: 02/19/22 Potential to Achieve Goals: Good    Frequency Min 2X/week     Co-evaluation               AM-PAC PT "6 Clicks" Mobility  Outcome Measure Help needed turning from your back to your side while in a flat bed without using bedrails?: None Help needed moving from lying on your back to sitting on the side of a flat bed without using bedrails?: None Help needed moving to and from a bed to a chair (including a wheelchair)?: A Little Help needed standing up from a chair using your arms (e.g., wheelchair or bedside chair)?: A Little Help needed to walk in hospital room?: A Little Help needed climbing 3-5 steps with a railing? : A Little 6 Click Score: 20    End of Session   Activity Tolerance: Patient tolerated treatment well;Patient  limited by fatigue Patient left: in chair;with call bell/phone within reach Nurse Communication: Mobility status PT Visit Diagnosis: Unsteadiness on feet (R26.81);Muscle weakness (generalized) (M62.81)    Time: 5830-9407 PT Time Calculation (min) (ACUTE ONLY): 18 min   Charges:   PT Evaluation $PT Eval Low Complexity: [place], PT, DPT 02/05/22, 3:58 PM   Waunita Schooner 02/05/2022, 3:56 PM

## 2022-02-05 NOTE — Progress Notes (Signed)
Order for wound care to left lower quadrant. Patient states that the MD changed the dressing "about an hour ago" and does not believe that it needs to be changed again. Dressing appears to be clean dry and intact. No signs of any drainage.

## 2022-02-06 DIAGNOSIS — Z433 Encounter for attention to colostomy: Secondary | ICD-10-CM | POA: Diagnosis not present

## 2022-02-06 LAB — COMPREHENSIVE METABOLIC PANEL
ALT: 17 U/L (ref 0–44)
AST: 20 U/L (ref 15–41)
Albumin: 2.8 g/dL — ABNORMAL LOW (ref 3.5–5.0)
Alkaline Phosphatase: 47 U/L (ref 38–126)
Anion gap: 4 — ABNORMAL LOW (ref 5–15)
BUN: <5 mg/dL — ABNORMAL LOW (ref 8–23)
CO2: 24 mmol/L (ref 22–32)
Calcium: 8.6 mg/dL — ABNORMAL LOW (ref 8.9–10.3)
Chloride: 110 mmol/L (ref 98–111)
Creatinine, Ser: 0.58 mg/dL — ABNORMAL LOW (ref 0.61–1.24)
GFR, Estimated: >60 mL/min (ref >60)
Glucose, Bld: 171 mg/dL — ABNORMAL HIGH (ref 70–99)
Potassium: 3.8 mmol/L (ref 3.5–5.1)
Sodium: 138 mmol/L (ref 135–145)
Total Bilirubin: 0.9 mg/dL (ref 0.3–1.2)
Total Protein: 5.7 g/dL — ABNORMAL LOW (ref 6.5–8.1)

## 2022-02-06 LAB — CBC
HCT: 33.5 % — ABNORMAL LOW (ref 39.0–52.0)
Hemoglobin: 10.7 g/dL — ABNORMAL LOW (ref 13.0–17.0)
MCH: 26.2 pg (ref 26.0–34.0)
MCHC: 31.9 g/dL (ref 30.0–36.0)
MCV: 81.9 fL (ref 80.0–100.0)
Platelets: 101 10*3/uL — ABNORMAL LOW (ref 150–400)
RBC: 4.09 MIL/uL — ABNORMAL LOW (ref 4.22–5.81)
RDW: 14.8 % (ref 11.5–15.5)
WBC: 3.3 10*3/uL — ABNORMAL LOW (ref 4.0–10.5)
nRBC: 0 % (ref 0.0–0.2)

## 2022-02-06 LAB — GLUCOSE, CAPILLARY
Glucose-Capillary: 164 mg/dL — ABNORMAL HIGH (ref 70–99)
Glucose-Capillary: 294 mg/dL — ABNORMAL HIGH (ref 70–99)
Glucose-Capillary: 242 mg/dL — ABNORMAL HIGH (ref 70–99)

## 2022-02-06 MED ORDER — ALBUMIN HUMAN 25 % IV SOLN
25.0000 g | Freq: Once | INTRAVENOUS | Status: AC
  Administered 2022-02-06: 25 g via INTRAVENOUS
  Filled 2022-02-06: qty 100

## 2022-02-06 NOTE — Progress Notes (Signed)
Mobility Specialist - Progress Note   02/06/22 1400  Mobility  Activity Ambulated independently in hallway;Stood at bedside;Dangled on edge of bed  Level of Assistance Standby assist, set-up cues, supervision of patient - no hands on  Assistive Device Cane  Distance Ambulated (ft) 160 ft  Activity Response Tolerated well  $Mobility charge 1 Mobility     Pt sitting EOB upon arrival using RA with family present. Donns shorts indep and completes STS indep. Ambulates 157f indep-supervision with SPC -- voices mild knee pain but no other signs of distress. No SOB noted and denies pain or dizziness. Pt returns EOB with needs in reach.  MMerrily BrittleMobility Specialist 02/06/22, 2:20 PM

## 2022-02-06 NOTE — Progress Notes (Addendum)
02/06/2022  Subjective: Patient is 5 Days Post-Op.  No acute events overnight.  Patient continues having bowel function and tolerated full liquids yesterday.  His pelvic drain output has significantly increased, but remained serous, likely from his cirrhosis and ascites.  Denies any worsening pain.   The patient's Cr, WBC, Hgb, and platelets are stable.  Vital signs: Temp:  [97.6 F (36.4 C)-98.9 F (37.2 C)] 98.5 F (36.9 C) (06/18 0748) Pulse Rate:  [71-93] 90 (06/18 0934) Resp:  [16-20] 16 (06/18 0748) BP: (101-165)/(58-79) 121/62 (06/18 0934) SpO2:  [93 %-99 %] 96 % (06/18 0748)   Intake/Output: 06/17 0701 - 06/18 0700 In: -  Out: 900 [Drains:900] Last BM Date : 02/05/22  Physical Exam: Constitutional:  No acute distress Abdomen:  soft, non-distended, appropriately sore to palpation.  Incisions healing appropriately with LLQ wound with packing gauze dressing.  Both drains with serous output.  Labs:  Recent Labs    02/05/22 0446 02/06/22 0647  WBC 3.6* 3.3*  HGB 10.6* 10.7*  HCT 32.0* 33.5*  PLT 90* 101*   Recent Labs    02/05/22 0446 02/06/22 0647  NA 140 138  K 3.4* 3.8  CL 113* 110  CO2 24 24  GLUCOSE 138* 171*  BUN 9 <5*  CREATININE 0.63 0.58*  CALCIUM 8.1* 8.6*   No results for input(s): "LABPROT", "INR" in the last 72 hours.  Imaging: No results found.  Assessment/Plan: This is a 61 y.o. male s/p robotic assisted colostomy reversal with upper abdominal laparotomy due to omental bleeding.  --Patient's labs are stable today.  His drains are serous.  However, due to the high output, will give him a dose of 25% albumin, 25 gm, to help with his fluid/protein losses. --Advance diet to soft diet today. --Continue dressing changes, monitor drain output. --OOB, ambulate.  I spent 35 minutes dedicated to the care of this patient on the date of this encounter to include pre-visit review of records, face-to-face time with the patient discussing diagnosis and  management, and any post-visit coordination of care.   Melvyn Neth, [facility]

## 2022-02-07 ENCOUNTER — Other Ambulatory Visit: Payer: Self-pay | Admitting: Family Medicine

## 2022-02-07 LAB — CBC
HCT: 31.3 % — ABNORMAL LOW (ref 39.0–52.0)
Hemoglobin: 10.2 g/dL — ABNORMAL LOW (ref 13.0–17.0)
MCH: 26.8 pg (ref 26.0–34.0)
MCHC: 32.6 g/dL (ref 30.0–36.0)
MCV: 82.4 fL (ref 80.0–100.0)
Platelets: 96 10*3/uL — ABNORMAL LOW (ref 150–400)
RBC: 3.8 MIL/uL — ABNORMAL LOW (ref 4.22–5.81)
RDW: 14.8 % (ref 11.5–15.5)
WBC: 3.2 10*3/uL — ABNORMAL LOW (ref 4.0–10.5)
nRBC: 0 % (ref 0.0–0.2)

## 2022-02-07 LAB — COMPREHENSIVE METABOLIC PANEL
ALT: 20 U/L (ref 0–44)
AST: 20 U/L (ref 15–41)
Albumin: 2.9 g/dL — ABNORMAL LOW (ref 3.5–5.0)
Alkaline Phosphatase: 44 U/L (ref 38–126)
Anion gap: 5 (ref 5–15)
BUN: 7 mg/dL — ABNORMAL LOW (ref 8–23)
CO2: 24 mmol/L (ref 22–32)
Calcium: 8.7 mg/dL — ABNORMAL LOW (ref 8.9–10.3)
Chloride: 108 mmol/L (ref 98–111)
Creatinine, Ser: 0.6 mg/dL — ABNORMAL LOW (ref 0.61–1.24)
GFR, Estimated: >60 mL/min (ref >60)
Glucose, Bld: 206 mg/dL — ABNORMAL HIGH (ref 70–99)
Potassium: 3.9 mmol/L (ref 3.5–5.1)
Sodium: 137 mmol/L (ref 135–145)
Total Bilirubin: 0.8 mg/dL (ref 0.3–1.2)
Total Protein: 5.4 g/dL — ABNORMAL LOW (ref 6.5–8.1)

## 2022-02-07 LAB — GLUCOSE, CAPILLARY
Glucose-Capillary: 381 mg/dL — ABNORMAL HIGH (ref 70–99)
Glucose-Capillary: 185 mg/dL — ABNORMAL HIGH (ref 70–99)
Glucose-Capillary: 352 mg/dL — ABNORMAL HIGH (ref 70–99)

## 2022-02-07 MED ORDER — INSULIN GLARGINE-YFGN 100 UNIT/ML ~~LOC~~ SOLN
40.0000 [IU] | Freq: Every day | SUBCUTANEOUS | Status: DC
  Administered 2022-02-07: 40 [IU] via SUBCUTANEOUS
  Filled 2022-02-07: qty 0.4

## 2022-02-07 MED ORDER — OXYCODONE HCL 5 MG PO TABS: 5.0000 mg | ORAL_TABLET | Freq: Four times a day (QID) | ORAL | 0 refills | Status: AC | PRN

## 2022-02-07 MED ORDER — LANTUS SOLOSTAR 100 UNIT/ML ~~LOC~~ SOPN: 40.0000 [IU] | PEN_INJECTOR | Freq: Every day | SUBCUTANEOUS | Status: DC

## 2022-02-07 MED ORDER — DOCUSATE SODIUM 100 MG PO CAPS: 100.0000 mg | ORAL_CAPSULE | Freq: Two times a day (BID) | ORAL | 0 refills | Status: AC | PRN

## 2022-02-07 MED ORDER — FIASP FLEXTOUCH 100 UNIT/ML ~~LOC~~ SOPN: 16.0000 [IU] | PEN_INJECTOR | Freq: Two times a day (BID) | SUBCUTANEOUS | Status: DC

## 2022-02-07 MED ORDER — ZOLPIDEM TARTRATE 10 MG PO TABS: 5.0000 mg | ORAL_TABLET | Freq: Every day | ORAL | 0 refills | Status: DC | PRN

## 2022-02-07 NOTE — Progress Notes (Signed)
Patient discharged home via private vehicle. All PIVs removed. Patient discharge instructions reviewed with patient, with patient verbalizing understanding. Patient satisfied that all belongings have been returned.

## 2022-02-07 NOTE — Discharge Instructions (Addendum)
Laparoscopic Colectomy, Care After This sheet gives you information about how to care for yourself after your procedure. Your health care provider may also give you more specific instructions. If you have problems or questions, contact your health care provider. What can I expect after the procedure? After your procedure, it is common to have the following: Pain in your abdomen, especially in the incision areas. You will be given medicine to control the pain. Tiredness. This is a normal part of the recovery process. Your energy level will return to normal over the next several weeks. Changes in your bowel movements, such as constipation or needing to go more often. Talk with your health care provider about how to manage this. Follow these instructions at home: Medicines  tylenol and advil as needed for discomfort.  Please alternate between the two every four hours as needed for pain.    Use narcotics, if prescribed, only when tylenol and motrin is not enough to control pain.  325-'650mg'$  every 8hrs to max of '4000mg'$ /24hrs (including the '325mg'$  in every norco dose) for the tylenol.    Advil up to '800mg'$  per dose every 8hrs as needed for pain.   Do not drive or use heavy machinery while taking prescription pain medicine. Do not drink alcohol while taking prescription pain medicine. If you were prescribed an antibiotic medicine, use it as told by your health care provider. Do not stop using the antibiotic even if you start to feel better. Incision care    Change gauze and abd pad over the left lower quadrant ostomy site every day.  Be sure to pack corner of 4x4 gauze in between staples and cover with abd pad. Recorded drain output daily and change gauze dressing around it every 24hrs Ok to remove gauze dressing over former drain site in left upper quadrant after 48hrs.  Then keep area covered with band aid until scab falls of on own. Do not wear tight clothing over the incisions. Tight clothing may rub  and irritate the incision areas, which may cause the incisions to open. Do not take baths, swim, or use a hot tub until your health care provider approves. OK TO SHOWER.   Check your incision area every day for signs of infection. Check for: More redness, swelling, or pain. More fluid or blood. Warmth. Pus or a bad smell. Activity Avoid lifting anything that is heavier than 10 lb (4.5 kg) for 2 weeks or until your health care provider says it is okay. You may resume normal activities as told by your health care provider. Ask your health care provider what activities are safe for you. Take rest breaks during the day as needed. Eating and drinking Follow instructions from your health care provider about what you can eat after surgery. To prevent or treat constipation while you are taking prescription pain medicine, your health care provider may recommend that you: Drink enough fluid to keep your urine clear or pale yellow. Take over-the-counter or prescription medicines. Eat foods that are high in fiber, such as fresh fruits and vegetables, whole grains, and beans. Limit foods that are high in fat and processed sugars, such as fried and sweet foods. General instructions Ask your health care provider when you will need an appointment to get your sutures or staples removed. Keep all follow-up visits as told by your health care provider. This is important. Contact a health care provider if: You have more redness, swelling, or pain around your incisions. You have more fluid or blood  coming from the incisions. Your incisions feel warm to the touch. You have pus or a bad smell coming from your incisions or your dressing. You have a fever. You have an incision that breaks open (edges not staying together) after sutures or staples have been removed. Get help right away if: You develop a rash. You have chest pain or difficulty breathing. You have pain or swelling in your legs. You feel  light-headed or you faint. Your abdomen swells (becomes distended). You have nausea or vomiting. You have blood in your stool (feces). This information is not intended to replace advice given to you by your health care provider. Make sure you discuss any questions you have with your health care provider. Document Released: 02/25/2005 Document Revised: 04/27/2018 Document Reviewed: 05/09/2016 Elsevier Interactive Patient Education  2019 [employer].

## 2022-02-07 NOTE — Discharge Summary (Signed)
Physician Discharge Summary  Patient ID: Patrick Elliott MRN: [id number] DOB/AGE: 05-09-1961 61 y.o.  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 Underwent surgery.  Please see op note for details.  Post op, recovered as expected from extent of required surgery.  Postop ileus noted with eventual  return to bowel function with IV fluid resuscitation, NG tube decompression.  Once bowel movements were noted, patient was able to have diet advance as tolerated with no issues.  JP drain continued to have serosanguineous output postop with no concerns for additional episodes of bleeding.  He did receive 1 additional unit of platelets postop day 1 due to dropping platelet count but then has remained steady at his baseline low platelet values. At time of d/c, tolerating diet and pain controlled.  Right upper quadrant JP drain removed prior to leaving since it was mostly serous fluid.  Pelvic drain around anastomosis that was slightly serosanguineous so left behind for further monitoring.  Former colostomy site will have daily wet-to-dry dressing changes until follow-up as well  Consults: None  Discharge Exam: Blood pressure 120/67, pulse (!) 101, temperature 98.3 F (36.8 C), temperature source Oral, resp. rate 16, height _0  (1.702 m), weight 79.8 kg, SpO2 98 %. General appearance: alert, cooperative, and no distress GI: Soft, no guarding, focal minor tenderness to palpation around ostomy site.  Pelvic drain with serosanguineous drainage.  Midline staples intact  Disposition:  Discharge disposition: 01-Home or Self Care        Allergies as of 02/07/2022       Reactions   Penicillins Nausea Only   Occurred with amoxicillin  Tolerated cephalosporins before   Sulfamethoxazole Nausea Only, Rash        Medication List     STOP  taking these medications    methylphenidate 10 MG tablet Commonly known as: RITALIN   metroNIDAZOLE 500 MG tablet Commonly known as: FLAGYL       TAKE these medications    Accu-Chek FastClix Lancet Kit As directed up to 4 times a day   Accu-Chek FastClix Lancets Misc USE AS DIRECTED UP TO 5 TIMES A DAY   Accu-Chek Guide test strip Generic drug: glucose blood USE AS INSTRUCTED 5 TIMES DAILY   BD Pen Needle Nano 2nd Gen 32G X 4 MM Misc Generic drug: Insulin Pen Needle USE AS DIRECTED TWICE A DAY WITH BYETTA   Blood Glucose Monitoring Suppl Kit 1 each as directed by Does not apply route.   Botox 100 units Solr injection Generic drug: botulinum toxin Type A Inject 1 each into the skin as needed.   Dexcom G6 Sensor Misc USE 1 SENSOR EVERY 10 DAYS   Dexcom G6 Transmitter Misc USE AS DIRECTED   docusate sodium 100 MG capsule Commonly known as: Colace Take 1 capsule (100 mg total) by mouth 2 (two) times daily as needed for up to 10 days for mild constipation.   Fiasp FlexTouch 100 UNIT/ML FlexTouch Pen Generic drug: insulin aspart Inject 16-20 Units into the skin 2 (two) times daily before a meal. < 200 / < 15 gm:  20 units; 200-250 / 15 gm: 22 units; 200-250 / 30 gm: 24 units; > 250 / < 15 gm: 24 units; > 250 / > 15 gm: 26 units   Gvoke HypoPen 2-Pack 0.5 MG/0.1ML Soaj Generic drug: Glucagon INJECT 0.5 MG INTO  THE SKIN AS NEEDED.   Jardiance 25 MG Tabs tablet Generic drug: empagliflozin TAKE 1 TABLET BY MOUTH EVERY DAY   lamoTRIgine 25 MG tablet Commonly known as: LAMICTAL TAKE 4 TABLETS (100 MG TOTAL) BY MOUTH DAILY.   Lantus SoloStar 100 UNIT/ML Solostar Pen Generic drug: insulin glargine Inject 40 Units into the skin daily. INJECT 24 UNITS INTO THE SKIN DAILY. What changed:  how much to take how to take this when to take this   lisdexamfetamine 70 MG capsule Commonly known as: VYVANSE Take 70 mg by mouth daily.   lisinopril 20 MG tablet Commonly  known as: ZESTRIL TAKE 1 TABLET BY MOUTH EVERY DAY   lithium carbonate 450 MG CR tablet Commonly known as: ESKALITH TAKE 1 TABLET BY MOUTH EVERY DAY WITH FOOD   metFORMIN 500 MG tablet Commonly known as: GLUCOPHAGE TAKE 1 TABLET BY MOUTH 2 TIMES DAILY WITH A MEAL.   methocarbamol 500 MG tablet Commonly known as: ROBAXIN TAKE 1 TABLET BY MOUTH EVERY 8 HOURS AS NEEDED FOR MUSCLE SPASMS   multivitamin capsule Take 1 capsule by mouth daily.   OLANZapine 10 MG tablet Commonly known as: ZYPREXA Take 10 mg by mouth at bedtime.   omeprazole 20 MG capsule Commonly known as: PRILOSEC TAKE 1 CAPSULE BY MOUTH EVERY DAY   oxyCODONE 5 MG immediate release tablet Commonly known as: Roxicodone Take 1 tablet (5 mg total) by mouth every 6 (six) hours as needed for up to 5 days for severe pain.   pregabalin 100 MG capsule Commonly known as: LYRICA TAKE 1 CAPSULE BY MOUTH THREE TIMES A DAY   Restasis 0.05 % ophthalmic emulsion Generic drug: cycloSPORINE Place 1 drop into both eyes 2 (two) times daily.   simvastatin 40 MG tablet Commonly known as: ZOCOR Take 40 mg by mouth at bedtime.   zolpidem 10 MG tablet Commonly known as: AMBIEN Take 0.5-1 tablets (5-10 mg total) by mouth daily as needed for sleep. What changed:  how much to take when to take this reasons to take this        [place], [place], DO. Go on 02/15/2022.   Specialty: Surgery Why: post op staple removal 8:45am appointment Contact information: [place] [address] [phone]                  Total time spent arranging discharge was >27mn. Signed: IBenjamine Sprague6/19/2023, 10:44 PM

## 2022-02-07 NOTE — Progress Notes (Signed)
Physical Therapy Treatment Patient Details Name: Patrick Elliott MRN: [id number] DOB: [birth year] Today's Date: 02/07/2022   History of Present Illness Pt is a 61 y/o M, s/p robotic assisted colostmy reversal with upper abdominal laparotomy 2/2 omental bleeding.    PT Comments    Pt seen for PT tx with pt agreeable & received sitting EOB. Pt is able to ambulate around nurses station with [place] negotiate stairs with 1 rail & step to pattern with SPC in alternate hand with supervision. Pt reports he feels he doesn't need f/u therapy upon d/c & this PT agrees. Will continue to follow pt acutely to address endurance & high level balance.    Recommendations for follow up therapy are one component of a multi-disciplinary discharge planning process, led by the attending physician.  Recommendations may be updated based on patient status, additional functional criteria and insurance authorization.  Follow Up Recommendations  No PT follow up     Assistance Recommended at Discharge PRN  Patient can return home with the following Assistance with cooking/housework   Equipment Recommendations  None recommended by PT    Recommendations for Other Services       Precautions / Restrictions Precautions Precautions: Fall Restrictions Weight Bearing Restrictions: No     Mobility  Bed Mobility               General bed mobility comments: pt received & left sitting EOB    Transfers Overall transfer level: Modified independent Equipment used: Straight cane Transfers: Sit to/from Stand Sit to Stand: Modified independent (Device/Increase time)                Ambulation/Gait Ambulation/Gait assistance: Modified independent (Device/Increase time) Gait Distance (Feet): 225 Feet Assistive device: Straight cane Gait Pattern/deviations: Decreased weight shift to right Gait velocity: decreased         Stairs Stairs: Yes Stairs assistance: Supervision Stair Management:  One rail Left, One rail Right, Step to pattern (R descending rail, L ascending rail) Number of Stairs: 7     Wheelchair Mobility    Modified Rankin (Stroke Patients Only)       Balance Overall balance assessment: Needs assistance Sitting-balance support: Feet supported Sitting balance-Leahy Scale: Normal     Standing balance support: During functional activity, Single extremity supported Standing balance-Leahy Scale: Good                              Cognition Arousal/Alertness: Awake/alert Behavior During Therapy: WFL for tasks assessed/performed Overall Cognitive Status: Within Functional Limits for tasks assessed                                 General Comments: Pleasant        Exercises      General Comments        Pertinent Vitals/Pain Pain Assessment Pain Assessment: 0-10 Pain Score: 5  Pain Location: abdomen Pain Descriptors / Indicators: Discomfort Pain Intervention(s): Monitored during session, Premedicated before session    Home Living                          Prior Function            PT Goals (current goals can now be found in the care plan section) Acute Rehab PT Goals Patient Stated Goal: decreased pain PT Goal  Formulation: With patient Time For Goal Achievement: 02/19/22 Potential to Achieve Goals: Good Progress towards PT goals: Progressing toward goals    Frequency    Min 2X/week      PT Plan Discharge plan needs to be updated    Co-evaluation              AM-PAC PT "6 Clicks" Mobility   Outcome Measure  Help needed turning from your back to your side while in a flat bed without using bedrails?: None Help needed moving from lying on your back to sitting on the side of a flat bed without using bedrails?: None Help needed moving to and from a bed to a chair (including a wheelchair)?: None Help needed standing up from a chair using your arms (e.g., wheelchair or bedside chair)?:  None Help needed to walk in hospital room?: None Help needed climbing 3-5 steps with a railing? : A Little 6 Click Score: 23    End of Session   Activity Tolerance: Patient tolerated treatment well Patient left:  (pt into bathroom without assistance)   PT Visit Diagnosis: Unsteadiness on feet (R26.81);Muscle weakness (generalized) (M62.81)     Time: 8341-9622 PT Time Calculation (min) (ACUTE ONLY): 8 min  Charges:  $Therapeutic Activity: 8-22 mins                     Lavone Nian, PT, DPT 02/07/22, 12:45 PM   Waunita Schooner 02/07/2022, 12:44 PM

## 2022-02-07 NOTE — Inpatient Diabetes Management (Signed)
Inpatient Diabetes Program Recommendations  AACE/ADA: New Consensus Statement on Inpatient Glycemic Control  Target Ranges:  Prepandial:   less than 140 mg/dL      Peak postprandial:   less than 180 mg/dL (1-2 hours)      Critically ill patients:  140 - 180 mg/dL    Latest Reference Range & Units 02/06/22 07:41 02/06/22 12:32 02/06/22 16:52 02/06/22 20:08 02/07/22 08:21  Glucose-Capillary 70 - 99 mg/dL 164 (H) 381 (H) 294 (H) 242 (H) 185 (H)   Review of Glycemic Control  Diabetes history: DM2 Outpatient Diabetes medications: Lantus 24 units daily (patient taking 40 units), Fiasp 20-26 units BID before meal, Jardiance 25 mg daily, Metformin 1000 mg BID Current orders for Inpatient glycemic control: Semglee 20 units daily, Novolog 0-15 units AC&HS  Inpatient Diabetes Program Recommendations:    Insulin: Please consider ordering Novolog 5 units TID with meals for meal coverage if patient eats at least 50% of meals.  Thanks, Barnie Alderman, RN, MSN, [place] Diabetes Coordinator Inpatient Diabetes Program [phone] (Team Pager from 8am to [place])

## 2022-02-24 ENCOUNTER — Other Ambulatory Visit: Payer: Self-pay | Admitting: Family Medicine

## 2022-02-24 DIAGNOSIS — E1140 Type 2 diabetes mellitus with diabetic neuropathy, unspecified: Secondary | ICD-10-CM

## 2022-03-03 DIAGNOSIS — F411 Generalized anxiety disorder: Secondary | ICD-10-CM | POA: Diagnosis not present

## 2022-03-03 DIAGNOSIS — F909 Attention-deficit hyperactivity disorder, unspecified type: Secondary | ICD-10-CM | POA: Diagnosis not present

## 2022-03-03 DIAGNOSIS — I10 Essential (primary) hypertension: Secondary | ICD-10-CM | POA: Diagnosis not present

## 2022-03-03 DIAGNOSIS — F3170 Bipolar disorder, currently in remission, most recent episode unspecified: Secondary | ICD-10-CM | POA: Diagnosis not present

## 2022-03-03 DIAGNOSIS — F5101 Primary insomnia: Secondary | ICD-10-CM | POA: Diagnosis not present

## 2022-03-03 DIAGNOSIS — E7800 Pure hypercholesterolemia, unspecified: Secondary | ICD-10-CM | POA: Diagnosis not present

## 2022-03-06 ENCOUNTER — Other Ambulatory Visit: Payer: Self-pay | Admitting: Family Medicine

## 2022-03-09 DIAGNOSIS — G518 Other disorders of facial nerve: Secondary | ICD-10-CM | POA: Diagnosis not present

## 2022-03-09 DIAGNOSIS — G43719 Chronic migraine without aura, intractable, without status migrainosus: Secondary | ICD-10-CM | POA: Diagnosis not present

## 2022-03-09 DIAGNOSIS — M542 Cervicalgia: Secondary | ICD-10-CM | POA: Diagnosis not present

## 2022-03-09 DIAGNOSIS — M791 Myalgia, unspecified site: Secondary | ICD-10-CM | POA: Diagnosis not present

## 2022-03-10 ENCOUNTER — Telehealth: Payer: Self-pay

## 2022-03-10 NOTE — Patient Instructions (Signed)
Visit Information  Mr. Patrick Elliott  - as a part of your Medicaid benefit, you are eligible for care management and care coordination services at no cost or copay. I was unable to reach you by phone today but would be happy to help you with your health related needs. Please feel free to call me @ [phone]).   A member of the Managed Medicaid care management team will reach out to you again over the next 7 days.   Mickel Fuchs, BSW, [place] Managed Medicaid Team  [phone]

## 2022-03-10 NOTE — Patient Outreach (Signed)
Care Coordination  03/10/2022  Patrick Elliott 10-31-1960 [id number]   Medicaid Managed Care   Unsuccessful Outreach Note  03/10/2022 Name: Patrick Elliott MRN: [id number] DOB: 07-Feb-1961  Referred by: Vickii Penna, MD Reason for referral : High Risk Managed Medicaid (MM Social Work [employer])   An unsuccessful telephone outreach was attempted today. The patient was referred to the case management team for assistance with care management and care coordination.   Follow Up Plan: The care management team will reach out to the patient again over the next 7 days.   Mickel Fuchs, BSW, [place] Managed Medicaid Team  [phone]

## 2022-03-11 ENCOUNTER — Telehealth: Payer: Self-pay | Admitting: Pharmacist

## 2022-03-11 DIAGNOSIS — E1140 Type 2 diabetes mellitus with diabetic neuropathy, unspecified: Secondary | ICD-10-CM

## 2022-03-11 MED ORDER — DEXCOM G7 SENSOR MISC: 11 refills | Status: DC

## 2022-03-11 MED ORDER — DEXCOM G6 SENSOR MISC: 11 refills | Status: DC

## 2022-03-11 MED ORDER — DEXCOM G6 TRANSMITTER MISC: 3 refills | Status: DC

## 2022-03-11 NOTE — Telephone Encounter (Signed)
Patient called Patrick Elliott. He shared details of his recovery from colon- reanastimosis procedure performed 6/13.  States recovery is going well and blood sugars have been higher than usual and erratic.  Overall, he reports a good recovery thus far at ~ 5 weeks.   He requested details on CGM Dexcom switch to [place].   Appears that insurance prefers Dexcom G6.  We discussed differences between G6 and G7.   Patient requested switch if possilble.    I agreed to try switch but communicated I would also submit G6 which appears to be preferred per Epic with instructions to only fill if the [place] claim fails.   Patient appreciated help.  We agreed on an [facility] follow-up in September.  He was asked to call if needed additional help.

## 2022-03-12 ENCOUNTER — Other Ambulatory Visit: Payer: Self-pay | Admitting: Family Medicine

## 2022-03-12 DIAGNOSIS — E1140 Type 2 diabetes mellitus with diabetic neuropathy, unspecified: Secondary | ICD-10-CM

## 2022-03-14 NOTE — Telephone Encounter (Signed)
Noted and agree. 

## 2022-03-15 DIAGNOSIS — N390 Urinary tract infection, site not specified: Secondary | ICD-10-CM | POA: Diagnosis not present

## 2022-03-15 DIAGNOSIS — I10 Essential (primary) hypertension: Secondary | ICD-10-CM | POA: Diagnosis not present

## 2022-03-15 DIAGNOSIS — R300 Dysuria: Secondary | ICD-10-CM | POA: Diagnosis not present

## 2022-04-01 DIAGNOSIS — D649 Anemia, unspecified: Secondary | ICD-10-CM | POA: Diagnosis not present

## 2022-04-01 DIAGNOSIS — E1143 Type 2 diabetes mellitus with diabetic autonomic (poly)neuropathy: Secondary | ICD-10-CM | POA: Diagnosis not present

## 2022-04-01 DIAGNOSIS — Z794 Long term (current) use of insulin: Secondary | ICD-10-CM | POA: Diagnosis not present

## 2022-04-01 DIAGNOSIS — E7800 Pure hypercholesterolemia, unspecified: Secondary | ICD-10-CM | POA: Diagnosis not present

## 2022-04-01 DIAGNOSIS — F3170 Bipolar disorder, currently in remission, most recent episode unspecified: Secondary | ICD-10-CM | POA: Diagnosis not present

## 2022-04-01 DIAGNOSIS — I10 Essential (primary) hypertension: Secondary | ICD-10-CM | POA: Diagnosis not present

## 2022-04-04 ENCOUNTER — Telehealth: Payer: Self-pay | Admitting: Pharmacist

## 2022-04-04 DIAGNOSIS — F901 Attention-deficit hyperactivity disorder, predominantly hyperactive type: Secondary | ICD-10-CM | POA: Diagnosis not present

## 2022-04-04 DIAGNOSIS — E1140 Type 2 diabetes mellitus with diabetic neuropathy, unspecified: Secondary | ICD-10-CM

## 2022-04-04 DIAGNOSIS — F05 Delirium due to known physiological condition: Secondary | ICD-10-CM | POA: Diagnosis not present

## 2022-04-04 DIAGNOSIS — F3170 Bipolar disorder, currently in remission, most recent episode unspecified: Secondary | ICD-10-CM | POA: Diagnosis not present

## 2022-04-04 DIAGNOSIS — F411 Generalized anxiety disorder: Secondary | ICD-10-CM | POA: Diagnosis not present

## 2022-04-04 DIAGNOSIS — E1143 Type 2 diabetes mellitus with diabetic autonomic (poly)neuropathy: Secondary | ICD-10-CM | POA: Diagnosis not present

## 2022-04-04 DIAGNOSIS — Z794 Long term (current) use of insulin: Secondary | ICD-10-CM | POA: Diagnosis not present

## 2022-04-04 MED ORDER — DEXCOM G7 SENSOR MISC: 11 refills | Status: DC

## 2022-04-04 NOTE — Telephone Encounter (Signed)
Noted and agree. 

## 2022-04-04 NOTE — Telephone Encounter (Signed)
Patient called and requested updated change from Dexcom G6 to [facility] G7  We had tried this in the recent past but his refill was too recent.    He is now in need of a refill.  We reviewed the differences between G6 and G7, specifically lack of transmitter with G7.   Patient will pick up later today.  Prescription sent.  G7 If G7 is not an option with insurance continuation with G6 seems appropriate.

## 2022-04-22 ENCOUNTER — Telehealth: Payer: Self-pay | Admitting: Pharmacist

## 2022-04-22 NOTE — Telephone Encounter (Signed)
Patient called and shared that he blood sugar control is not at goal and he requests assistance with improvement on control.  He would like to lower his A1c to below 8 (currently 8.9 per his report) as this is the number he needs to achieve to allow him to be cleared for knee surgery.   We agreed on appointment in 1 week. I look forward to seeing him at that time.

## 2022-04-27 DIAGNOSIS — M791 Myalgia, unspecified site: Secondary | ICD-10-CM | POA: Diagnosis not present

## 2022-04-27 DIAGNOSIS — M542 Cervicalgia: Secondary | ICD-10-CM | POA: Diagnosis not present

## 2022-04-27 DIAGNOSIS — G518 Other disorders of facial nerve: Secondary | ICD-10-CM | POA: Diagnosis not present

## 2022-04-27 DIAGNOSIS — G43719 Chronic migraine without aura, intractable, without status migrainosus: Secondary | ICD-10-CM | POA: Diagnosis not present

## 2022-04-28 ENCOUNTER — Encounter: Payer: Self-pay | Admitting: Pharmacist

## 2022-04-28 ENCOUNTER — Ambulatory Visit: Payer: Medicaid Other | Admitting: Pharmacist

## 2022-04-28 DIAGNOSIS — I10 Essential (primary) hypertension: Secondary | ICD-10-CM

## 2022-04-28 DIAGNOSIS — Z794 Long term (current) use of insulin: Secondary | ICD-10-CM

## 2022-04-28 DIAGNOSIS — E118 Type 2 diabetes mellitus with unspecified complications: Secondary | ICD-10-CM | POA: Diagnosis not present

## 2022-04-28 DIAGNOSIS — E1140 Type 2 diabetes mellitus with diabetic neuropathy, unspecified: Secondary | ICD-10-CM

## 2022-04-28 MED ORDER — LANTUS SOLOSTAR 100 UNIT/ML ~~LOC~~ SOPN: 44.0000 [IU] | PEN_INJECTOR | Freq: Every day | SUBCUTANEOUS | Status: DC

## 2022-04-28 MED ORDER — FIASP FLEXTOUCH 100 UNIT/ML ~~LOC~~ SOPN: PEN_INJECTOR | SUBCUTANEOUS | 3 refills | Status: DC

## 2022-04-28 NOTE — Patient Instructions (Addendum)
It was nice to see you today!  Your goal blood sugar is 80-130 before eating and less than 180 after eating.  Medication Changes:  -Increased dose of basal insulin Lantus (insulin glargine) to 44 units daily from 40 units daily. -Adjusted dose of rapid insulin aspart (insulin Fiasp) to  11 am: 16 units plus 2-4 units based on carb intake 8 pm: 36 units plus 2-4 units based on carb intake Midnight: 10 units plus 2-4 units based on blood sugar   Keep up the good work with diet and exercise. Aim for a diet full of vegetables, fruit and lean meats (chicken, [place], fish). Try to limit salt intake by eating fresh or frozen vegetables (instead of canned), rinse canned vegetables prior to cooking and do not add any additional salt to meals.

## 2022-04-28 NOTE — Assessment & Plan Note (Signed)
Blood pressure at goal of <130/80 mmHg.  Patient notes symptoms of orthostasis with positional change (bending over) and has not taken blood pressure agent lisinopril for the last 10 days.  No significant change in BP or HR on standing from sitting today in office.  - Discontinue lisinopril due to orthostasis symptoms and controlled pressure.

## 2022-04-28 NOTE — Assessment & Plan Note (Signed)
Diabetes longstanding, currently uncontrolled. Patient is able to verbalize appropriate hypoglycemia management plan. Medication adherence appears good. Control is suboptimal due to an un optimized regimen. -Increased dose of basal insulin Lantus (insulin glargine) to 44 units daily from 40 units daily. -Adjusted dose of rapid insulin aspart (insulin Fiasp) to  11 am: 16 units plus 2-4 units based on carb intake 8 pm: 36 units plus 2-4 units based on carb intake Midnight: 10 units plus 2-4 units based on blood sugar -Continued SGLT2-I Jardiance (generic empagliflozin) 25 mg daily. -Continued metformin 500 mg BID.  -Patient educated on purpose, proper use, and potential adverse effects of increasing Lantus, possible hypoglycemic events.  -Extensively discussed pathophysiology of diabetes, recommended lifestyle interventions, dietary effects on blood sugar control.  -Counseled on s/sx of and management of hypoglycemia.

## 2022-04-28 NOTE — Progress Notes (Signed)
S:     Chief Complaint  Patient presents with   Follow-up   Medication Management    Diabetes management   Patrick Elliott is a 61 y.o. male who presents for diabetes evaluation, education, and management.  PMH is significant for DM, HTN, right knee OA.  Patient was referred and last seen by Primary Care Provider, Dr. Tammi Klippel, on 04/04/2022.   Today, patient arrives in good spirits and presents without any assistance.   Current diabetes medications include: Glucagon as needed, insulin aspart on sliding scale BID with meals, Lantus 40 units daily, metformin 500 mg BID Current hypertension medications include: none, stopped taking lisinopril Current hyperlipidemia medications include: simvastatin 40 mg daily  Patient reports adherence to taking all medications as prescribed. Stopped taking lisinopril due to dizziness, does not need zolpidem since starting the olanzapine.   Do you feel that your medications are working for you? yes Have you been experiencing any side effects to the medications prescribed? Yes, stopped taking lisinopril 10 days ago because he was getting light-headed. Do you have any problems obtaining medications due to transportation or finances? no Insurance coverage: [place] Medicaid [employer]  Patient reports hypoglycemic events, has had to use glucagon 6-8 times in the last month.  O:   Review of Systems  Gastrointestinal:  Negative for constipation and diarrhea.  All other systems reviewed and are negative.   Physical Exam Vitals reviewed.  Constitutional:      Appearance: Normal appearance. He is normal weight.  Neurological:     Mental Status: Mental status is at baseline.  Psychiatric:        Mood and Affect: Mood normal.        Behavior: Behavior normal.        Thought Content: Thought content normal.     7 day average blood glucose:  CGM Download:  % Time CGM is active: 91.3% Average Glucose: 241 mg/dL Glucose Management Indicator:  9.1% Glucose Variability: 39.5% Time in Goal:  - Time in range 70-180: 33% - Time above range: 67% - Time below range: 0% Observed patterns:   Lab Results  Component Value Date   HGBA1C 7.4 (H) 12/04/2021   Vitals:   04/28/22 1410 04/28/22 1412  BP: 127/63 122/61  Pulse:  75  SpO2:      Lipid Panel     Component Value Date/Time   CHOL 168 03/06/2020 1640   TRIG 154 (H) 03/06/2020 1640   HDL 44 03/06/2020 1640   CHOLHDL 3.8 03/06/2020 1640   CHOLHDL 3.9 11/02/2016 1551   VLDL 23 11/02/2016 1551   LDLCALC 97 03/06/2020 1640   LDLDIRECT 79 05/29/2009 2224    A/P: Diabetes longstanding, currently uncontrolled. Patient is able to verbalize appropriate hypoglycemia management plan. Medication adherence appears good. Control is suboptimal due to an un optimized regimen. -Increased dose of basal insulin Lantus (insulin glargine) to 44 units daily from 40 units daily. -Adjusted dose of rapid insulin aspart (insulin Fiasp) to  11 am: 16 units plus 2-4 units based on carb intake 8 pm: 36 units plus 2-4 units based on carb intake Midnight: 10 units plus 2-4 units based on blood sugar -Continued SGLT2-I Jardiance (generic empagliflozin) 25 mg daily. -Continued metformin 500 mg BID.  -Patient educated on purpose, proper use, and potential adverse effects of increasing Lantus, possible hypoglycemic events.  -Extensively discussed pathophysiology of diabetes, recommended lifestyle interventions, dietary effects on blood sugar control.  -Counseled on s/sx of and management of hypoglycemia.  Blood pressure at goal of <130/80 mmHg.  Patient notes symptoms of orthostasis with positional change (bending over) and has not taken blood pressure agent lisinopril for the last 10 days.  No significant change in BP or HR on standing from sitting today in office.  - Discontinue lisinopril due to orthostasis symptoms and controlled pressure.   Written patient instructions provided. Patient  verbalized understanding of treatment plan.  Total time in face to face counseling 45 minutes.    Follow-up:  Pharmacist Dr. Valentina Lucks in about 6 weeks. PCP clinic visit with Dr. Tammi Klippel on 07/06/2022.  Patient seen with Martina Sinner, PharmD Candidate, Jeneen Rinks,  PharmD PGY-1 Resident, and Joseph Art, PharmD, PGY2 Pharmacy Resident.  Marland Kitchen

## 2022-05-07 ENCOUNTER — Other Ambulatory Visit (INDEPENDENT_AMBULATORY_CARE_PROVIDER_SITE_OTHER): Payer: Self-pay | Admitting: Family Medicine

## 2022-05-27 NOTE — Telephone Encounter (Signed)
Phone call attempted to follow-up on dexcom sensor request.   Unavailable, left message asking him to call back and clarify if he is in need of something prior to his visit with me 10/19.   Provided phone call back number.

## 2022-06-08 DIAGNOSIS — G43719 Chronic migraine without aura, intractable, without status migrainosus: Secondary | ICD-10-CM | POA: Diagnosis not present

## 2022-06-08 DIAGNOSIS — M542 Cervicalgia: Secondary | ICD-10-CM | POA: Diagnosis not present

## 2022-06-08 DIAGNOSIS — M791 Myalgia, unspecified site: Secondary | ICD-10-CM | POA: Diagnosis not present

## 2022-06-08 DIAGNOSIS — G518 Other disorders of facial nerve: Secondary | ICD-10-CM | POA: Diagnosis not present

## 2022-06-09 ENCOUNTER — Ambulatory Visit: Payer: Medicaid Other | Admitting: Pharmacist

## 2022-06-09 VITALS — BP 139/72 | HR 87 | Wt 186.6 lb

## 2022-06-09 DIAGNOSIS — Z23 Encounter for immunization: Secondary | ICD-10-CM | POA: Diagnosis not present

## 2022-06-09 DIAGNOSIS — E1140 Type 2 diabetes mellitus with diabetic neuropathy, unspecified: Secondary | ICD-10-CM

## 2022-06-09 DIAGNOSIS — E118 Type 2 diabetes mellitus with unspecified complications: Secondary | ICD-10-CM | POA: Diagnosis not present

## 2022-06-09 DIAGNOSIS — Z794 Long term (current) use of insulin: Secondary | ICD-10-CM | POA: Diagnosis not present

## 2022-06-09 MED ORDER — LANTUS SOLOSTAR 100 UNIT/ML ~~LOC~~ SOPN: 44.0000 [IU] | PEN_INJECTOR | Freq: Every day | SUBCUTANEOUS | Status: DC

## 2022-06-09 MED ORDER — OMEPRAZOLE 20 MG PO CPDR: 20.0000 mg | DELAYED_RELEASE_CAPSULE | Freq: Every day | ORAL | 1 refills | Status: AC

## 2022-06-09 MED ORDER — FIASP FLEXTOUCH 100 UNIT/ML ~~LOC~~ SOPN: PEN_INJECTOR | SUBCUTANEOUS | 3 refills | Status: DC

## 2022-06-09 NOTE — Progress Notes (Signed)
S:     Chief Complaint  Patient presents with   Medication Management    Diabetes   Patrick Elliott is a 61 y.o. male who presents for diabetes evaluation, education, and management.  PMH is significant for DM, HTN, right knee OA.  Patient was referred and last seen by Primary Care Provider, Dr. Tammi Klippel, on 04/04/2022.  At last visit, the patients Lantus was increased from 40 units to 44 units and Fiasp was adjusted to: 11 am: 16 units plus 2-4 units based on carb intake 8 pm: 36 units plus 2-4 units based on carb intake Midnight: 10 units plus 2-4 units based on blood sugar   Today, patient arrives in good spirits and presents without any assistance.   Current diabetes medications include: Lantus (insulin glargine) 44 units daily, insulin aspart 26 units BID, empagliflozin 25 mg daily, metformin 500 mg BID, glucagon 0.5 mg PRN hypoglycemia  Current hypertension medications include: carvedilol 6.25 mg BID  Patient reports adherence to taking all medications as prescribed.   Insurance coverage: Medicaid  Patient reports hypoglycemic events. Reports using glucagon approx. 6 times in past month.  Patient reported dietary habits: Eats 2 larger meals/day Breakfast: minimal Lunch: moderate size  Dinner: largets meal Snacks: throughout the day intermittently Drinks: water  Patient-reported exercise habits: limited to ADL  O:   Review of Systems  Musculoskeletal:  Positive for joint pain (Knee).    Physical Exam Constitutional:      Appearance: Normal appearance.  Pulmonary:     Effort: Pulmonary effort is normal.  Neurological:     Mental Status: He is alert.  Psychiatric:        Mood and Affect: Mood normal.        Behavior: Behavior normal.     Attempted CGM Download and determined that patient was not in data-sharing mode.  Spent > 30 minutes in managing reconnection with Dexcom and data-sharing portal Dexcom Clarity Data availble today: from 10/4 through  10/17 % Time CGM is active: 82%% Average Glucose: 273 mg/dL Glucose Management Indicator: 9.8  Glucose Variability: 29.2 (goal <36%) Time in Goal:  - Time in range 70-180: 13% - Time above range: 87% with 58% of time very high > 250 - Time below range: 0% Observed patterns: Remains high all night.   Minimal low readings during sleeping hours (no observed nocturnal hypoglycemia readings)   Lab Results  Component Value Date   HGBA1C 7.4 (H) 12/04/2021   Vitals:   06/09/22 1514 06/09/22 1550  BP: (!) 161/73 139/72  Pulse: 89 87  SpO2: 98%     Lipid Panel     Component Value Date/Time   CHOL 168 03/06/2020 1640   TRIG 154 (H) 03/06/2020 1640   HDL 44 03/06/2020 1640   CHOLHDL 3.8 03/06/2020 1640   CHOLHDL 3.9 11/02/2016 1551   VLDL 23 11/02/2016 1551   LDLCALC 97 03/06/2020 1640   LDLDIRECT 79 05/29/2009 2224   Patient is participating in a Managed Medicaid Plan:  Yes   A/P: Diabetes longstanding and highly fluctuating blood sugars despite basal bolus insulin regimen.  Patient is able to verbalize appropriate hypoglycemia management plan and appears to be untilizing Glucagon appropriately. Medication adherence appears good. Control is suboptimal due to dietary indiscretion, sedentary lifestyle and suboptimal insulin sliding scale use. -Continued Lantus (insulin glargine) 44 units daily -Adjusted dose of Fiasp (insulin aspart) to 20 units at lunchtime and 30 units with evening meal  -Continued Jardiance (empagliflozin) 25 mg -  Continued metformin '500mg'$  to BID. -Extensively discussed pathophysiology of diabetes, recommended lifestyle interventions, dietary effects on blood sugar control.  -Counseled on s/sx of and management of hypoglycemia.  -Next A1c anticipated when GMI is less than 8   Flu shot requested and administered in office today.    Written patient instructions provided. Patient verbalized understanding of treatment plan.  Total time in face to face counseling  55 minutes.    Follow-up:  Pharmacist by phone in 1-2 weeks to reassess control with CGM and possible dose adjustments. [facility] PCP clinic scheduled with Dr. Tammi Klippel. Patient seen with Geraldo Docker, PharmD Candidate, Garrel Ridgel PharmD Candidate.and Joseph Art, PharmD, PGY2 Pharmacy Resident.  Marland Kitchen

## 2022-06-09 NOTE — Patient Instructions (Addendum)
It was great to see you today!  Continue Lantus (insulin glargine) 44 units daily  Adjust Fiasp (insulin aspart) to 20 units at lunchtime and 30 units with evening meal   We will follow-up tomorrow over the phone to ensure your Dexcom is connected.

## 2022-06-10 NOTE — Assessment & Plan Note (Signed)
Diabetes longstanding and highly fluctuating blood sugars despite basal bolus insulin regimen.  Patient is able to verbalize appropriate hypoglycemia management plan and appears to be untilizing Glucagon appropriately. Medication adherence appears good. Control is suboptimal due to dietary indiscretion, sedentary lifestyle and suboptimal insulin sliding scale use. -Continued Lantus (insulin glargine) 44 units daily -Adjusted dose of Fiasp (insulin aspart) to 20 units at lunchtime and 30 units with evening meal  -Continued Jardiance (empagliflozin) 25 mg -Continued metformin '500mg'$  to BID. -Extensively discussed pathophysiology of diabetes, recommended lifestyle interventions, dietary effects on blood sugar control.  -Counseled on s/sx of and management of hypoglycemia.  -Next A1c anticipated when GMI is less than 8

## 2022-06-13 NOTE — Progress Notes (Signed)
Reviewed: I agree with Dr. Koval's documentation and management. 

## 2022-06-17 ENCOUNTER — Telehealth: Payer: Self-pay | Admitting: Pharmacist

## 2022-06-17 NOTE — Telephone Encounter (Signed)
-----   Message from Leavy Cella, [place] sent at 06/10/2022  9:34 AM EDT ----- Regarding: Blood Glucose Follow-Up CGM capturing?

## 2022-06-17 NOTE — Telephone Encounter (Signed)
Contacted patient following review of CGM data - Dexcom G7  We discussed options for insulin adjustment.   Agreed to increase Lantus from 44 to 48 units once daily in the AM Continued same dose of meal-time 20 piror to lunch and 30 units prior to late day meal.  He expressed need for CGM replacement as he has had multiple fall off in the last week.   I shared that I would try to get him a new sensor sample.  Prior to next week.  New request sent to local rep.

## 2022-06-23 NOTE — Telephone Encounter (Signed)
Patient calls nurse line reporting he missed a call from "Sophia."   Patient reports she told him via VM sensors were ready for pick up.   Will forward to pharmacy to confirm.

## 2022-07-01 ENCOUNTER — Telehealth: Payer: Self-pay | Admitting: Pharmacist

## 2022-07-01 DIAGNOSIS — E1140 Type 2 diabetes mellitus with diabetic neuropathy, unspecified: Secondary | ICD-10-CM | POA: Diagnosis not present

## 2022-07-01 DIAGNOSIS — F3170 Bipolar disorder, currently in remission, most recent episode unspecified: Secondary | ICD-10-CM | POA: Diagnosis not present

## 2022-07-01 DIAGNOSIS — E118 Type 2 diabetes mellitus with unspecified complications: Secondary | ICD-10-CM

## 2022-07-01 DIAGNOSIS — E7800 Pure hypercholesterolemia, unspecified: Secondary | ICD-10-CM | POA: Diagnosis not present

## 2022-07-01 DIAGNOSIS — Z794 Long term (current) use of insulin: Secondary | ICD-10-CM | POA: Diagnosis not present

## 2022-07-01 MED ORDER — DEXCOM G7 SENSOR MISC: 11 refills | Status: AC

## 2022-07-01 MED ORDER — LANTUS SOLOSTAR 100 UNIT/ML ~~LOC~~ SOPN: 46.0000 [IU] | PEN_INJECTOR | Freq: Every day | SUBCUTANEOUS | 11 refills | Status: AC

## 2022-07-01 NOTE — Telephone Encounter (Signed)
-----   Message from Leavy Cella, [place] sent at 06/28/2022  1:53 PM EST ----- Regarding: Review Dexcom report

## 2022-07-01 NOTE — Telephone Encounter (Signed)
Patient called and communicated that he was in need of Dexcom G7 sensors.   His pharmacy appears to believe he is on the G6.  I agreed to send new prescription for G7.   We also discussed his blood sugar readings and control.  Increase of Lantus from 44 units daily to 46 units daily was agreed as an adjustment.  No change to bolus dosing regimen.  New prescription for Lantus (insulin glargine) supplied.   Contacted pharmacy a verified that both prescriptions were received and I anticipate both are covered.    I asked patient to contact our office if insulin shortage occurred.

## 2022-07-05 ENCOUNTER — Telehealth: Payer: Self-pay | Admitting: Pharmacist

## 2022-07-05 NOTE — Telephone Encounter (Signed)
Patient called and reported that he is likely going to be "short" on his insulin glargine supply.  He requested 1 pen to fill the gap in his supply.    I returned call and Medication Samples have been prepared (labeled) for the patient to pick-up 07/06/2022.  Located in the refrigerator  Drug name: Lantus (insulin glarine)            Qty: 1 pen  LOT: [id number]  Exp.Date: 08/22/2023  Dosing instructions: Continue same dose.   The patient has been instructed regarding the correct time, dose, and frequency of taking this medication, including desired effects and most common side effects.   Janeann Forehand 1:33 PM 07/05/2022   Patient also shared that he has multiple teeth (as many as 7) that he is planning to have repaired/pulled in the upcoming weeks.  We discussed the need to significantly reduce insulin dose shortly AFTER the dental "work".    Patient thankful for the anticipated change.

## 2022-07-06 DIAGNOSIS — F05 Delirium due to known physiological condition: Secondary | ICD-10-CM | POA: Diagnosis not present

## 2022-07-06 DIAGNOSIS — E7800 Pure hypercholesterolemia, unspecified: Secondary | ICD-10-CM | POA: Diagnosis not present

## 2022-07-06 DIAGNOSIS — E1140 Type 2 diabetes mellitus with diabetic neuropathy, unspecified: Secondary | ICD-10-CM | POA: Diagnosis not present

## 2022-07-06 DIAGNOSIS — F901 Attention-deficit hyperactivity disorder, predominantly hyperactive type: Secondary | ICD-10-CM | POA: Diagnosis not present

## 2022-07-06 DIAGNOSIS — F3170 Bipolar disorder, currently in remission, most recent episode unspecified: Secondary | ICD-10-CM | POA: Diagnosis not present

## 2022-07-06 DIAGNOSIS — F5101 Primary insomnia: Secondary | ICD-10-CM | POA: Diagnosis not present

## 2022-07-06 DIAGNOSIS — Z794 Long term (current) use of insulin: Secondary | ICD-10-CM | POA: Diagnosis not present

## 2022-07-06 DIAGNOSIS — I10 Essential (primary) hypertension: Secondary | ICD-10-CM | POA: Diagnosis not present

## 2022-07-17 ENCOUNTER — Other Ambulatory Visit: Payer: Self-pay | Admitting: Family Medicine

## 2022-07-17 DIAGNOSIS — E1140 Type 2 diabetes mellitus with diabetic neuropathy, unspecified: Secondary | ICD-10-CM

## 2022-07-20 ENCOUNTER — Other Ambulatory Visit: Payer: Self-pay | Admitting: Family Medicine

## 2022-07-20 DIAGNOSIS — E1140 Type 2 diabetes mellitus with diabetic neuropathy, unspecified: Secondary | ICD-10-CM

## 2022-07-20 DIAGNOSIS — G43719 Chronic migraine without aura, intractable, without status migrainosus: Secondary | ICD-10-CM | POA: Diagnosis not present

## 2022-07-20 DIAGNOSIS — G518 Other disorders of facial nerve: Secondary | ICD-10-CM | POA: Diagnosis not present

## 2022-07-20 DIAGNOSIS — M791 Myalgia, unspecified site: Secondary | ICD-10-CM | POA: Diagnosis not present

## 2022-07-20 DIAGNOSIS — M542 Cervicalgia: Secondary | ICD-10-CM | POA: Diagnosis not present

## 2022-08-23 DIAGNOSIS — M542 Cervicalgia: Secondary | ICD-10-CM | POA: Diagnosis not present

## 2022-08-23 DIAGNOSIS — G518 Other disorders of facial nerve: Secondary | ICD-10-CM | POA: Diagnosis not present

## 2022-08-23 DIAGNOSIS — M791 Myalgia, unspecified site: Secondary | ICD-10-CM | POA: Diagnosis not present

## 2022-08-23 DIAGNOSIS — G43719 Chronic migraine without aura, intractable, without status migrainosus: Secondary | ICD-10-CM | POA: Diagnosis not present

## 2022-09-03 IMAGING — CT CT CERVICAL SPINE W/O CM
3 of 4 series · 12 of 34 positions shown, 14 images · non-contrast
Comparison: MRI brain dated November 27, 2008.

CLINICAL DATA: Altered mental status. Psychotic episode per
patient's family.



[Series 4: sagittal bone · sagittal · 0.27mm/px · 5 of 72 slices shown, 6 images]
[im 24/72  bone]
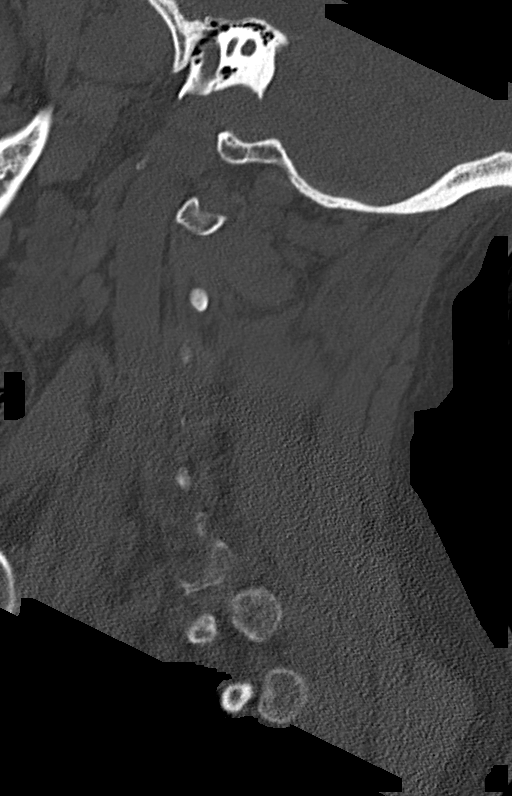
[im 30/72  bone]
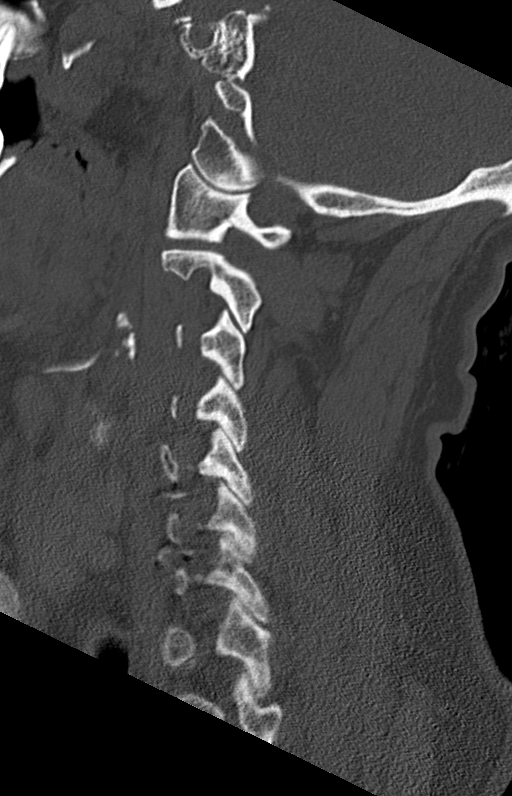
[im 36/72  soft-tissue]
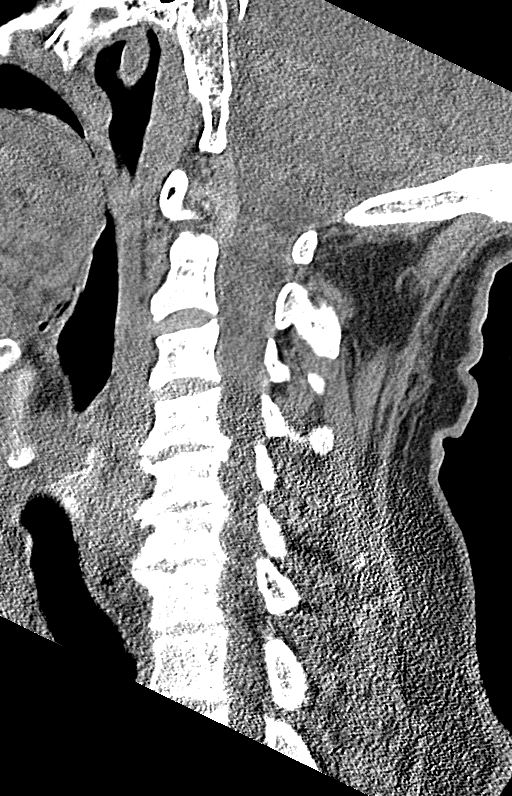
[im 36/72  bone]
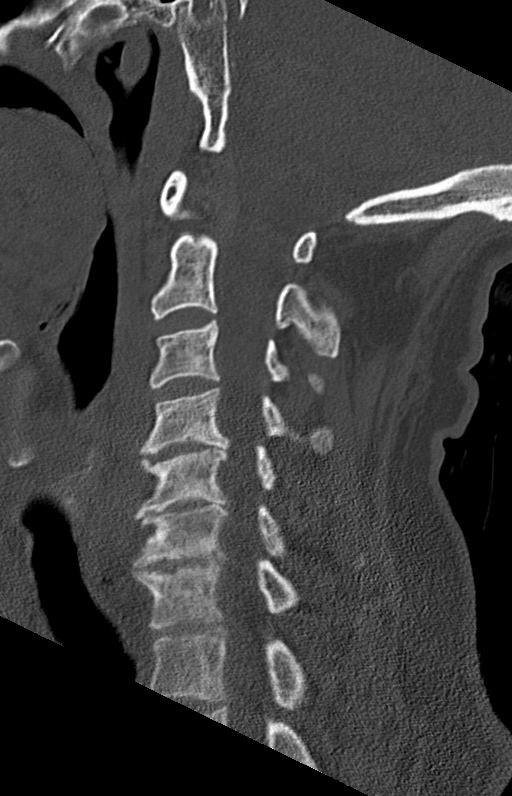
[im 42/72  bone]
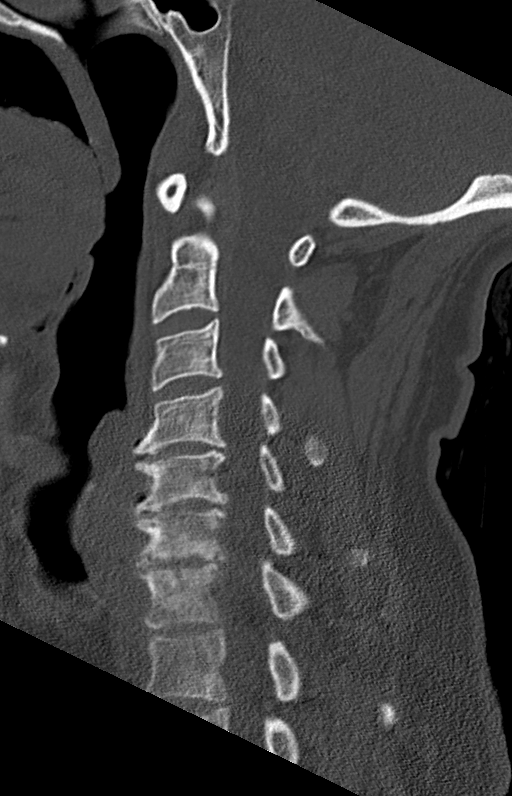
[im 48/72  bone]
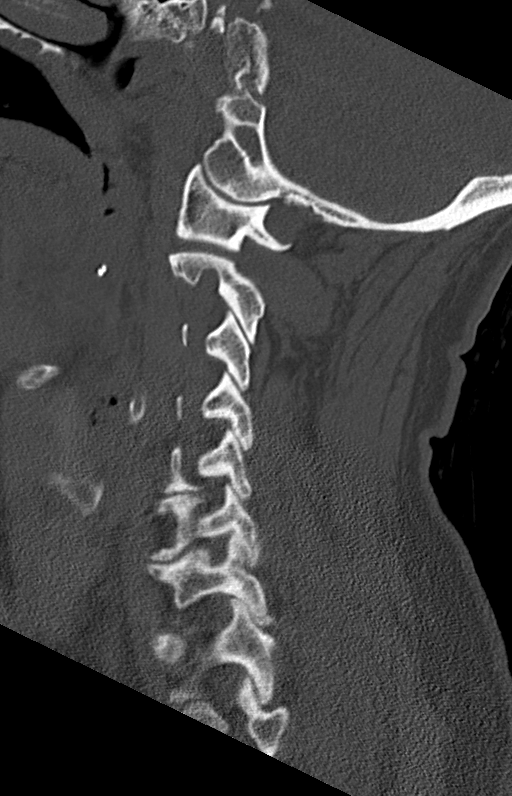

[Series 5: coronal bone · coronal · 0.30mm/px · 3 of 61 slices shown]
[im 14/61  bone]
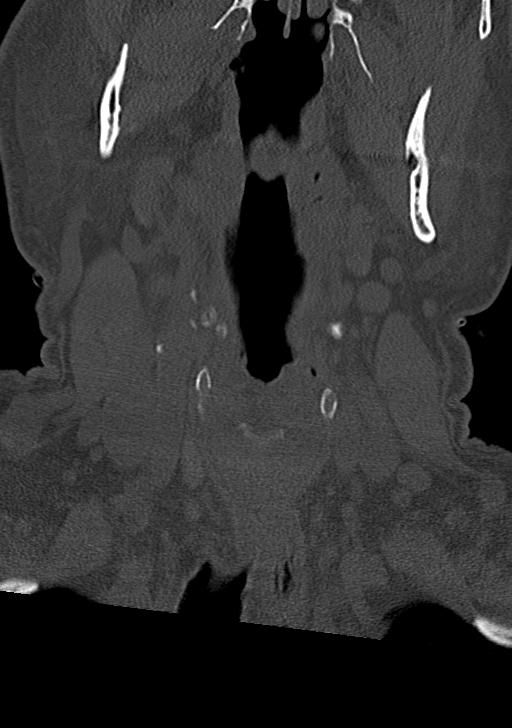
[im 25/61  bone]
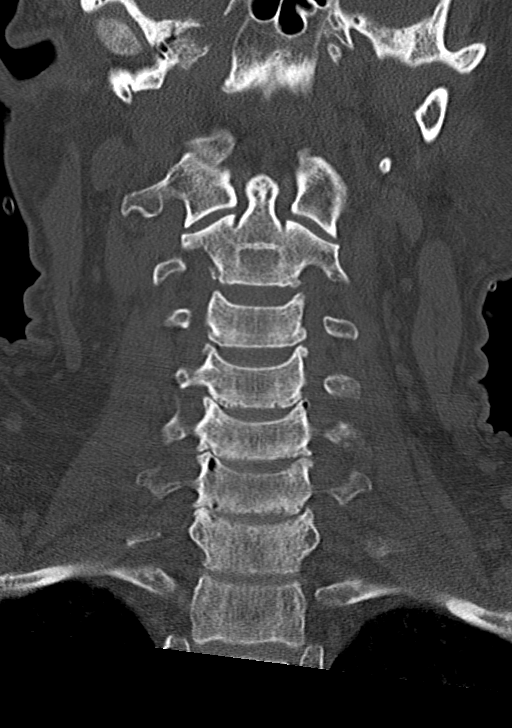
[im 36/61  bone]
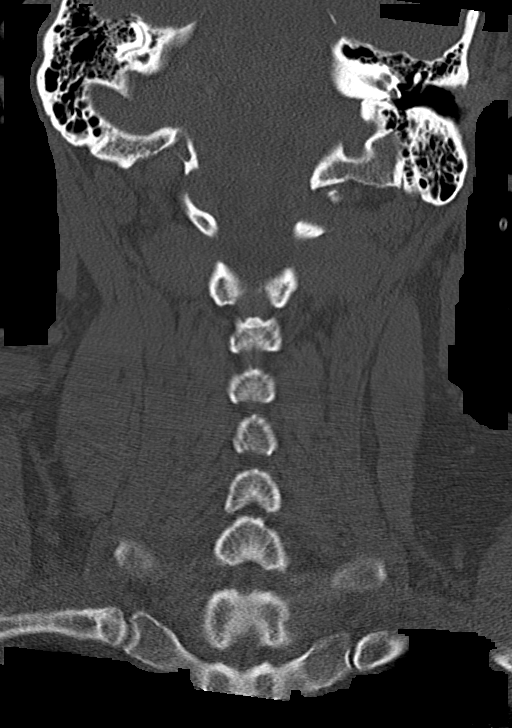

[Series 6: orthogonal bone · axial · 0.28mm/px · z∈[+21,+150]mm · 4 of 106 slices shown, 5 images]
[im 18/106  soft-tissue]
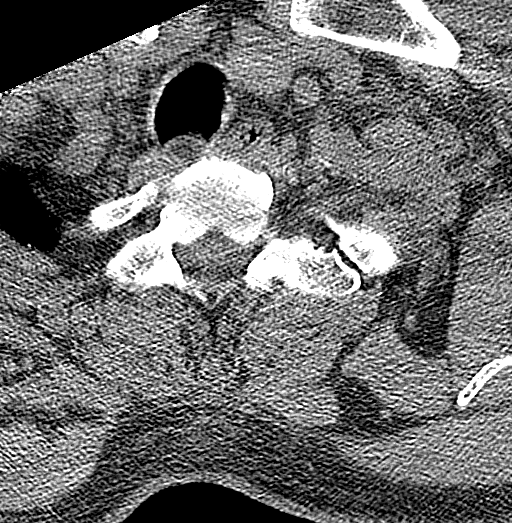
[im 18/106  bone]
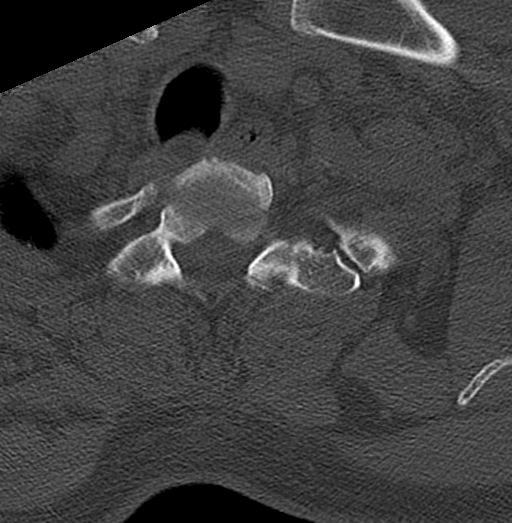
[im 36/106  bone]
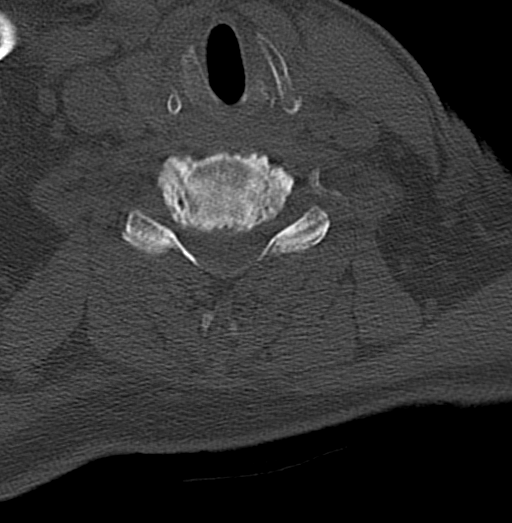
[im 71/106  bone]
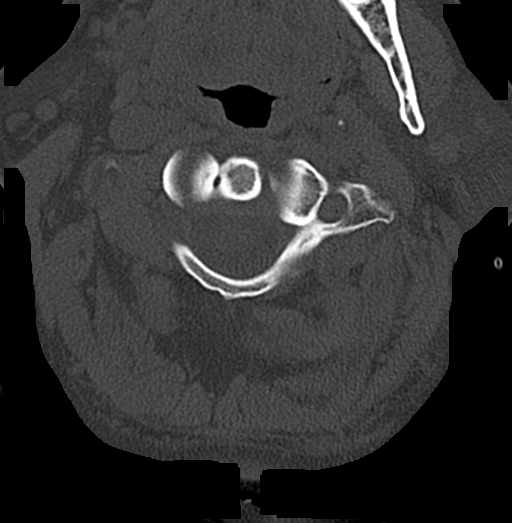
[im 88/106  bone]
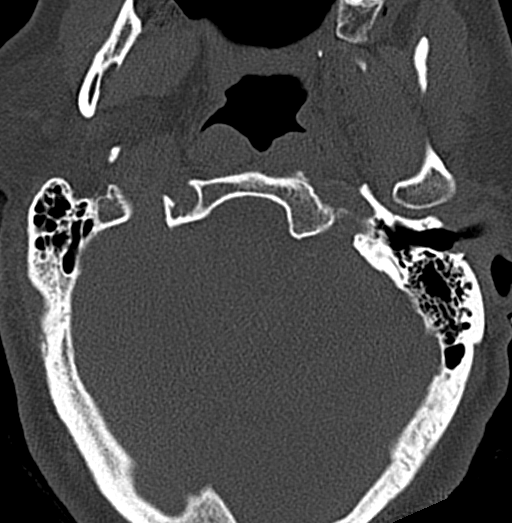

[12 of 34 positions shown; findings below may reference images not displayed]

FINDINGS: CT HEAD FINDINGS

Brain: No evidence of acute infarction, hemorrhage, hydrocephalus,
extra-axial collection or mass lesion/mass effect.

Vascular: Atherosclerotic vascular calcification of the carotid
siphons. No hyperdense vessel.

Skull: Normal. Negative for fracture or focal lesion.

Sinuses/Orbits: No acute finding.

Other: None.

CT CERVICAL SPINE FINDINGS

Alignment: Straightening of the normal cervical lordosis. No
traumatic malalignment.

Skull base and vertebrae: No acute fracture. No primary bone lesion
or focal pathologic process.

Soft tissues and spinal canal: No prevertebral fluid or swelling. No
visible canal hematoma.

Disc levels: Mild-to-moderate disc height loss and moderate
uncovertebral hypertrophy from C4-C5 through C6-C7.

Upper chest: Negative.

Other: None.
IMPRESSION: 1. No acute intracranial abnormality.
2. No acute cervical spine fracture or subluxation.

## 2022-09-03 IMAGING — CT CT HEAD W/O CM
4 series · 16 of 47 positions shown, 18 images · non-contrast
Comparison: MRI brain dated November 27, 2008.

CLINICAL DATA: Altered mental status. Psychotic episode per
patient's family.



[Series 2: head wo · axial · 0.44mm/px · z∈[+211,+331]mm · 7 of 34 slices shown, 9 images]
[im 5/34  brain]
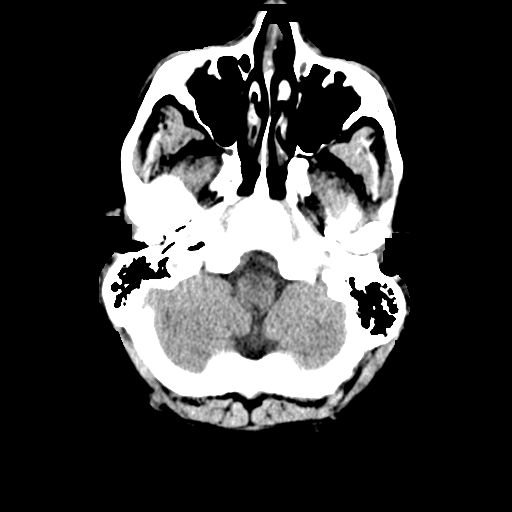
[im 5/34  bone]
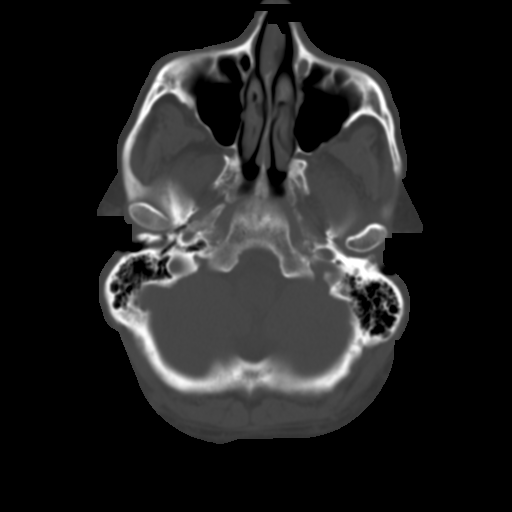
[im 9/34  brain]
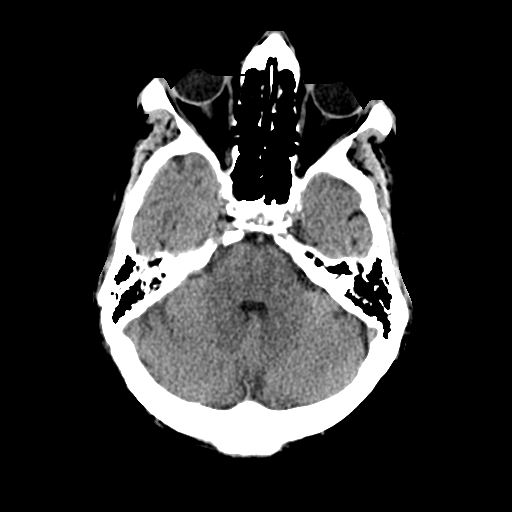
[im 13/34  brain]
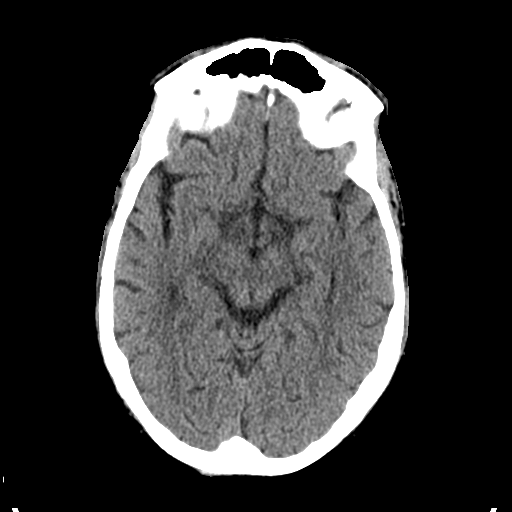
[im 17/34  brain]
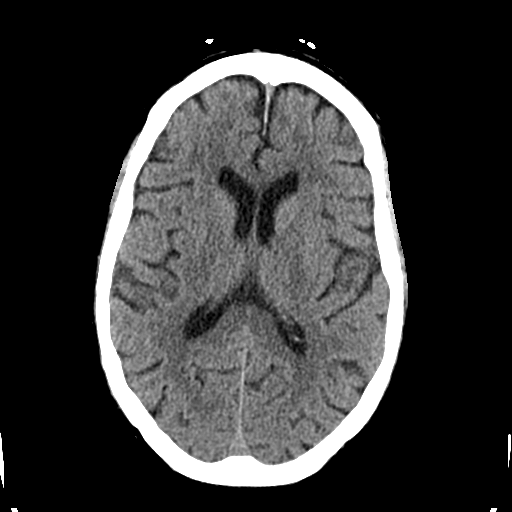
[im 21/34  brain]
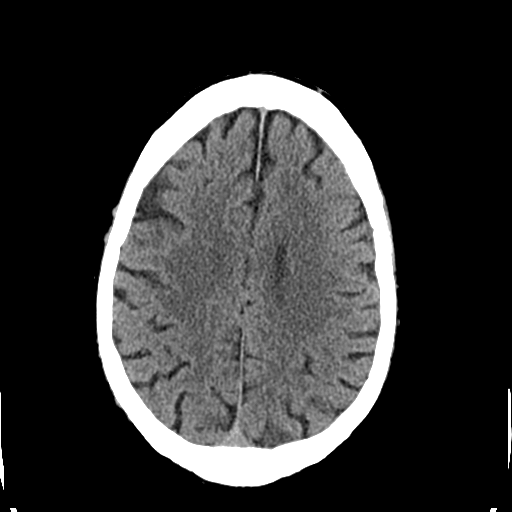
[im 21/34  bone]
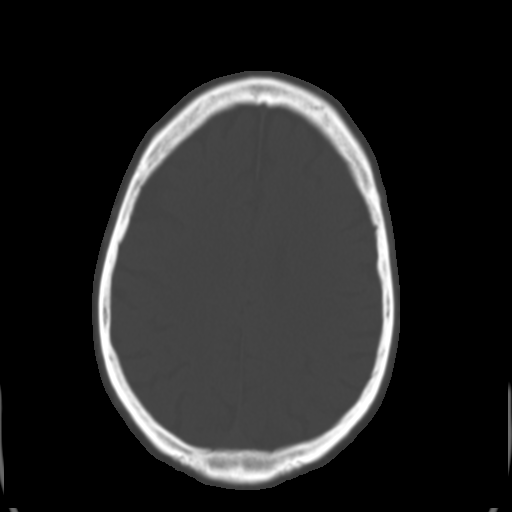
[im 25/34  brain]
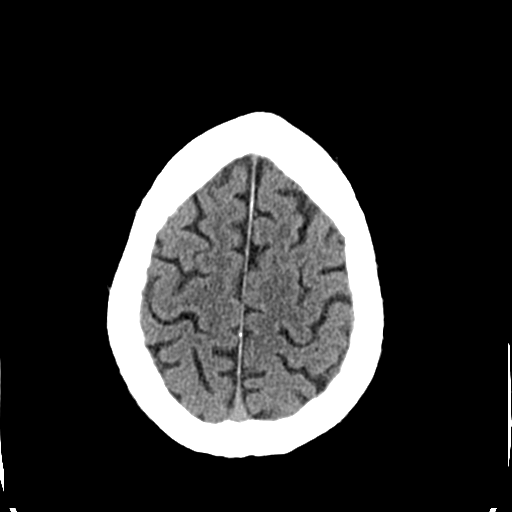
[im 29/34  brain]
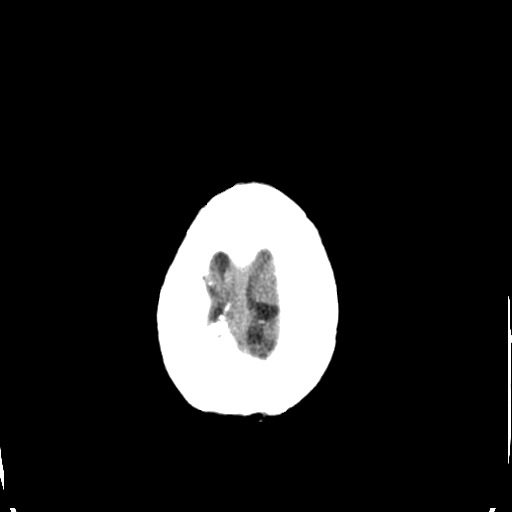

[Series 3: head bone · axial · 0.44mm/px · z∈[+207,+239]mm · 3 of 84 slices shown]
[im 9/84  bone]
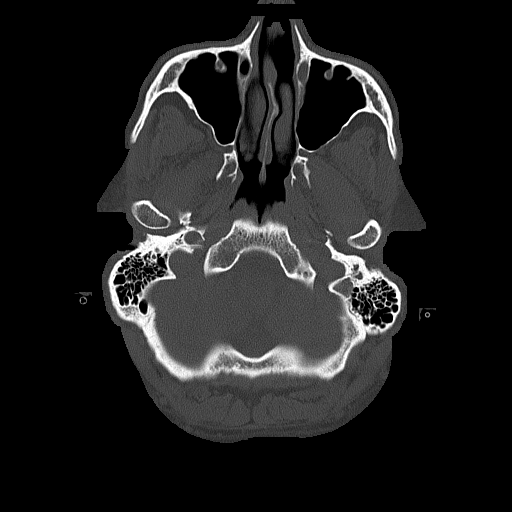
[im 17/84  bone]
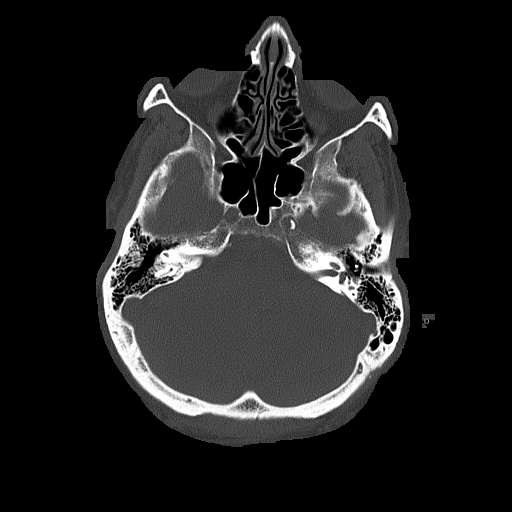
[im 25/84  bone]
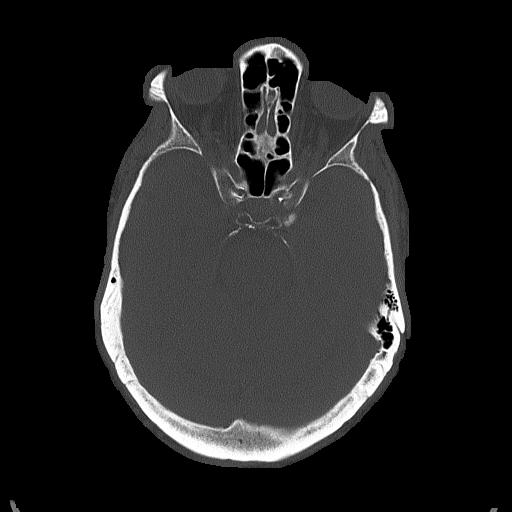

[Series 4: coronal soft tissue · coronal · 0.32mm/px · 3 of 68 slices shown]
[im 23/68  brain]
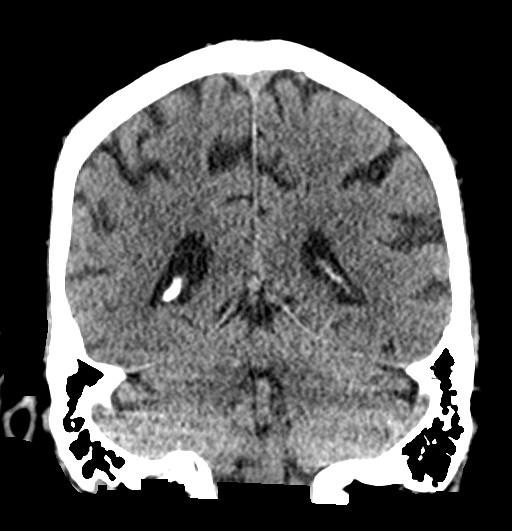
[im 30/68  brain]
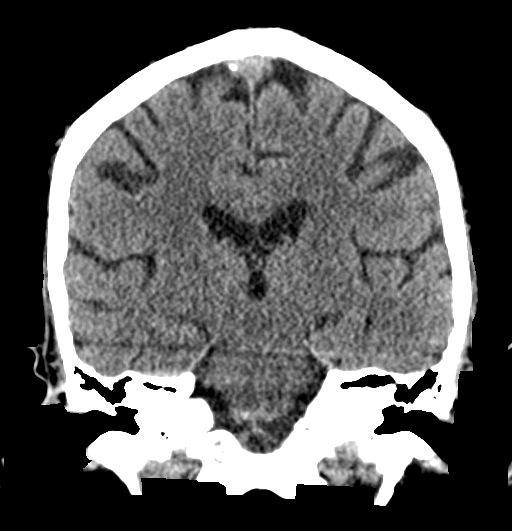
[im 38/68  brain]
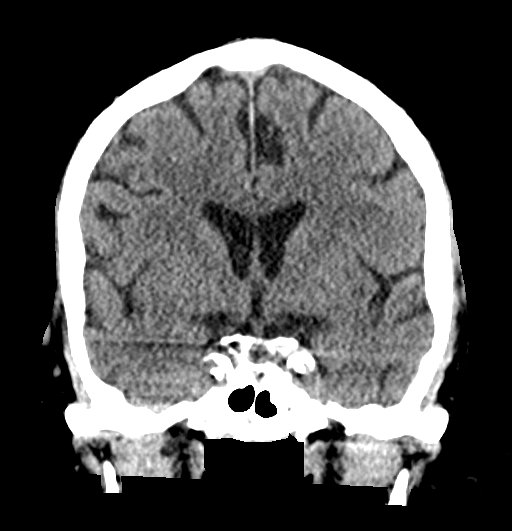

[Series 5: sagittal soft tissue · sagittal · 0.32mm/px · 3 of 53 slices shown]
[im 18/53  brain]
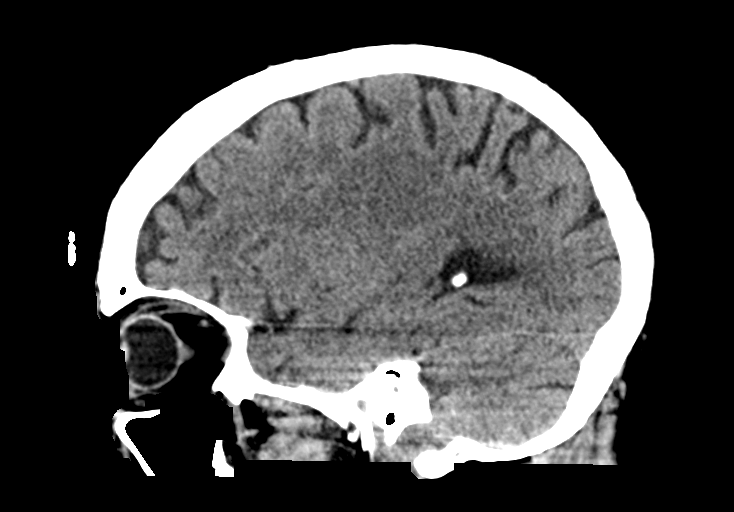
[im 27/53  brain]
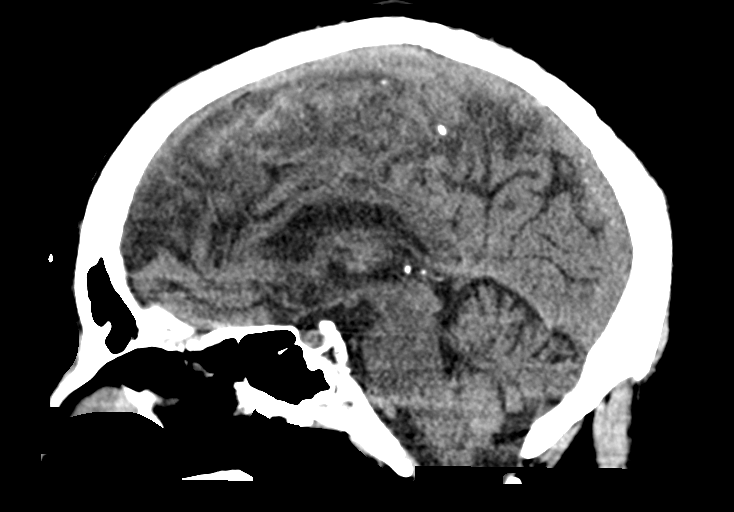
[im 35/53  brain]
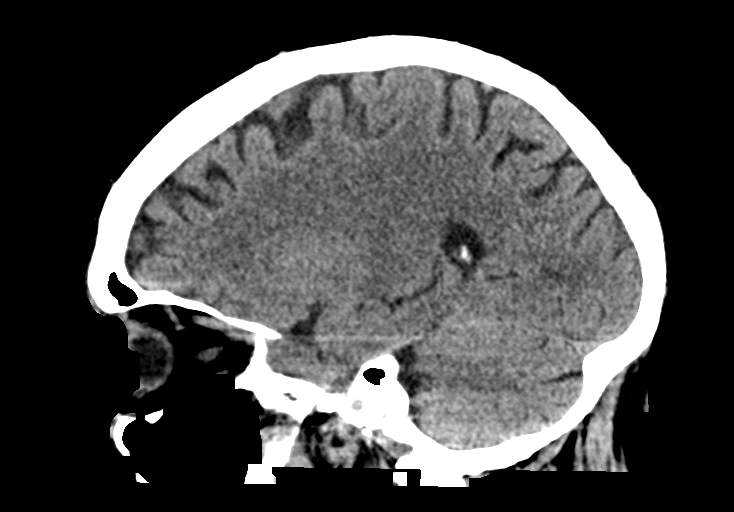

[16 of 47 positions shown; findings below may reference images not displayed]

FINDINGS: CT HEAD FINDINGS

Brain: No evidence of acute infarction, hemorrhage, hydrocephalus,
extra-axial collection or mass lesion/mass effect.

Vascular: Atherosclerotic vascular calcification of the carotid
siphons. No hyperdense vessel.

Skull: Normal. Negative for fracture or focal lesion.

Sinuses/Orbits: No acute finding.

Other: None.

CT CERVICAL SPINE FINDINGS

Alignment: Straightening of the normal cervical lordosis. No
traumatic malalignment.

Skull base and vertebrae: No acute fracture. No primary bone lesion
or focal pathologic process.

Soft tissues and spinal canal: No prevertebral fluid or swelling. No
visible canal hematoma.

Disc levels: Mild-to-moderate disc height loss and moderate
uncovertebral hypertrophy from C4-C5 through C6-C7.

Upper chest: Negative.

Other: None.
IMPRESSION: 1. No acute intracranial abnormality.
2. No acute cervical spine fracture or subluxation.

## 2022-09-14 DIAGNOSIS — I10 Essential (primary) hypertension: Secondary | ICD-10-CM | POA: Diagnosis not present

## 2022-09-14 DIAGNOSIS — E789 Disorder of lipoprotein metabolism, unspecified: Secondary | ICD-10-CM | POA: Diagnosis not present

## 2022-09-14 DIAGNOSIS — Z01810 Encounter for preprocedural cardiovascular examination: Secondary | ICD-10-CM | POA: Diagnosis not present

## 2022-09-15 DIAGNOSIS — I440 Atrioventricular block, first degree: Secondary | ICD-10-CM | POA: Diagnosis not present

## 2022-09-15 DIAGNOSIS — I4510 Unspecified right bundle-branch block: Secondary | ICD-10-CM | POA: Diagnosis not present

## 2022-09-15 DIAGNOSIS — Z01810 Encounter for preprocedural cardiovascular examination: Secondary | ICD-10-CM | POA: Diagnosis not present

## 2022-09-30 DIAGNOSIS — I517 Cardiomegaly: Secondary | ICD-10-CM | POA: Diagnosis not present

## 2022-10-05 DIAGNOSIS — M21941 Unspecified acquired deformity of hand, right hand: Secondary | ICD-10-CM | POA: Diagnosis not present

## 2022-10-05 DIAGNOSIS — K0889 Other specified disorders of teeth and supporting structures: Secondary | ICD-10-CM | POA: Diagnosis not present

## 2022-10-05 DIAGNOSIS — F3170 Bipolar disorder, currently in remission, most recent episode unspecified: Secondary | ICD-10-CM | POA: Diagnosis not present

## 2022-10-05 DIAGNOSIS — Z794 Long term (current) use of insulin: Secondary | ICD-10-CM | POA: Diagnosis not present

## 2022-10-05 DIAGNOSIS — E1140 Type 2 diabetes mellitus with diabetic neuropathy, unspecified: Secondary | ICD-10-CM | POA: Diagnosis not present

## 2022-10-05 DIAGNOSIS — M79641 Pain in right hand: Secondary | ICD-10-CM | POA: Diagnosis not present

## 2022-10-05 DIAGNOSIS — M79642 Pain in left hand: Secondary | ICD-10-CM | POA: Diagnosis not present

## 2022-10-05 DIAGNOSIS — F411 Generalized anxiety disorder: Secondary | ICD-10-CM | POA: Diagnosis not present

## 2022-10-05 DIAGNOSIS — F901 Attention-deficit hyperactivity disorder, predominantly hyperactive type: Secondary | ICD-10-CM | POA: Diagnosis not present

## 2022-10-07 ENCOUNTER — Telehealth: Payer: Self-pay | Admitting: Pharmacist

## 2022-10-07 NOTE — Telephone Encounter (Signed)
2/14 voice mail from from PCP, Dr. Wilma Flavin.   Interested in talking about blood glucose control.    2/15  Patient has transitioned care to Dr. Tammi Klippel.   He has been seeing patient to extended period of time for psychiatric concerns.   We discussed therapy options as Dr. Tammi Klippel was interested in improving blood sugar control which was  A1C 9.5. Patient has need for dental procedure and needs lower A1C.  We briefly discussed use of GLP in this patient.  I expressed concern for use given patient's history of severe illness ~ 20 years ago which had an impact on patient's pancreatic function.   While on the call, Dr. Tammi Klippel reviewed most recent A1C he had obtained the previous day.  The current A1C was 8.0  Dr. Tammi Klippel was open to the idea of transitioning care of diabetes to him.  I was comfortable with that transition.    2/15 - PM call to patient.  Shared the conversation and patient agreed with plan to transition care of Diabetes to Dr. Tammi Klippel.  Patient thanked me for contributions to his care over the past 15-20 years.    No additional follow-up by [place] planned.

## 2022-10-07 NOTE — Telephone Encounter (Signed)
Reviewed and agree with Dr Graylin Shiver plan.

## 2022-11-03 DIAGNOSIS — E1140 Type 2 diabetes mellitus with diabetic neuropathy, unspecified: Secondary | ICD-10-CM | POA: Diagnosis not present

## 2022-11-09 DIAGNOSIS — M542 Cervicalgia: Secondary | ICD-10-CM | POA: Diagnosis not present

## 2022-11-09 DIAGNOSIS — M791 Myalgia, unspecified site: Secondary | ICD-10-CM | POA: Diagnosis not present

## 2022-11-09 DIAGNOSIS — G518 Other disorders of facial nerve: Secondary | ICD-10-CM | POA: Diagnosis not present

## 2022-11-09 DIAGNOSIS — G43719 Chronic migraine without aura, intractable, without status migrainosus: Secondary | ICD-10-CM | POA: Diagnosis not present

## 2022-11-11 ENCOUNTER — Telehealth: Payer: Self-pay

## 2022-11-11 NOTE — Telephone Encounter (Signed)
A Prior RE-Authorization was initiated for this patients DEXCOM G7 SENSORS through CoverMyMeds.   Key: [id number]

## 2022-11-15 NOTE — Telephone Encounter (Signed)
Rec'd letter from [facility] regarding PA request.   More info needed: documentation of face to face encounter with ordering practitoner no more than 3 months prior, to evaluate the efficacy of the CGM monitoring system, pt using as prescribed, and that pt has been able to maintain or further improve glycemic control.   No documentation available. Pt sees new PCP.

## 2022-12-02 DIAGNOSIS — E1140 Type 2 diabetes mellitus with diabetic neuropathy, unspecified: Secondary | ICD-10-CM | POA: Diagnosis not present

## 2022-12-20 DIAGNOSIS — M791 Myalgia, unspecified site: Secondary | ICD-10-CM | POA: Diagnosis not present

## 2022-12-20 DIAGNOSIS — M542 Cervicalgia: Secondary | ICD-10-CM | POA: Diagnosis not present

## 2022-12-20 DIAGNOSIS — G43719 Chronic migraine without aura, intractable, without status migrainosus: Secondary | ICD-10-CM | POA: Diagnosis not present

## 2022-12-20 DIAGNOSIS — G518 Other disorders of facial nerve: Secondary | ICD-10-CM | POA: Diagnosis not present

## 2023-01-04 DIAGNOSIS — E1165 Type 2 diabetes mellitus with hyperglycemia: Secondary | ICD-10-CM | POA: Diagnosis not present

## 2023-01-04 DIAGNOSIS — F411 Generalized anxiety disorder: Secondary | ICD-10-CM | POA: Diagnosis not present

## 2023-01-04 DIAGNOSIS — F3170 Bipolar disorder, currently in remission, most recent episode unspecified: Secondary | ICD-10-CM | POA: Diagnosis not present

## 2023-01-04 DIAGNOSIS — Z794 Long term (current) use of insulin: Secondary | ICD-10-CM | POA: Diagnosis not present

## 2023-01-04 DIAGNOSIS — F901 Attention-deficit hyperactivity disorder, predominantly hyperactive type: Secondary | ICD-10-CM | POA: Diagnosis not present

## 2023-02-02 DIAGNOSIS — Z794 Long term (current) use of insulin: Secondary | ICD-10-CM | POA: Diagnosis not present

## 2023-02-02 DIAGNOSIS — F909 Attention-deficit hyperactivity disorder, unspecified type: Secondary | ICD-10-CM | POA: Diagnosis not present

## 2023-02-02 DIAGNOSIS — E1165 Type 2 diabetes mellitus with hyperglycemia: Secondary | ICD-10-CM | POA: Diagnosis not present

## 2023-02-02 DIAGNOSIS — E1140 Type 2 diabetes mellitus with diabetic neuropathy, unspecified: Secondary | ICD-10-CM | POA: Diagnosis not present

## 2023-02-08 DIAGNOSIS — G43719 Chronic migraine without aura, intractable, without status migrainosus: Secondary | ICD-10-CM | POA: Diagnosis not present

## 2023-02-08 DIAGNOSIS — G518 Other disorders of facial nerve: Secondary | ICD-10-CM | POA: Diagnosis not present

## 2023-02-08 DIAGNOSIS — M542 Cervicalgia: Secondary | ICD-10-CM | POA: Diagnosis not present

## 2023-02-08 DIAGNOSIS — M791 Myalgia, unspecified site: Secondary | ICD-10-CM | POA: Diagnosis not present

## 2023-03-22 DIAGNOSIS — G518 Other disorders of facial nerve: Secondary | ICD-10-CM | POA: Diagnosis not present

## 2023-03-22 DIAGNOSIS — M542 Cervicalgia: Secondary | ICD-10-CM | POA: Diagnosis not present

## 2023-03-22 DIAGNOSIS — M791 Myalgia, unspecified site: Secondary | ICD-10-CM | POA: Diagnosis not present

## 2023-03-22 DIAGNOSIS — G43719 Chronic migraine without aura, intractable, without status migrainosus: Secondary | ICD-10-CM | POA: Diagnosis not present

## 2023-05-08 DIAGNOSIS — E1140 Type 2 diabetes mellitus with diabetic neuropathy, unspecified: Secondary | ICD-10-CM | POA: Diagnosis not present

## 2023-05-08 DIAGNOSIS — Z794 Long term (current) use of insulin: Secondary | ICD-10-CM | POA: Diagnosis not present

## 2023-05-08 DIAGNOSIS — S46912A Strain of unspecified muscle, fascia and tendon at shoulder and upper arm level, left arm, initial encounter: Secondary | ICD-10-CM | POA: Diagnosis not present

## 2023-05-10 DIAGNOSIS — G43719 Chronic migraine without aura, intractable, without status migrainosus: Secondary | ICD-10-CM | POA: Diagnosis not present

## 2023-05-10 DIAGNOSIS — G518 Other disorders of facial nerve: Secondary | ICD-10-CM | POA: Diagnosis not present

## 2023-05-10 DIAGNOSIS — M542 Cervicalgia: Secondary | ICD-10-CM | POA: Diagnosis not present

## 2023-05-10 DIAGNOSIS — M791 Myalgia, unspecified site: Secondary | ICD-10-CM | POA: Diagnosis not present

## 2023-06-20 DIAGNOSIS — G43719 Chronic migraine without aura, intractable, without status migrainosus: Secondary | ICD-10-CM | POA: Diagnosis not present

## 2023-06-20 DIAGNOSIS — G518 Other disorders of facial nerve: Secondary | ICD-10-CM | POA: Diagnosis not present

## 2023-06-20 DIAGNOSIS — M542 Cervicalgia: Secondary | ICD-10-CM | POA: Diagnosis not present

## 2023-06-20 DIAGNOSIS — M791 Myalgia, unspecified site: Secondary | ICD-10-CM | POA: Diagnosis not present

## 2023-07-06 DIAGNOSIS — Z794 Long term (current) use of insulin: Secondary | ICD-10-CM | POA: Diagnosis not present

## 2023-07-06 DIAGNOSIS — F909 Attention-deficit hyperactivity disorder, unspecified type: Secondary | ICD-10-CM | POA: Diagnosis not present

## 2023-07-06 DIAGNOSIS — E1140 Type 2 diabetes mellitus with diabetic neuropathy, unspecified: Secondary | ICD-10-CM | POA: Diagnosis not present

## 2023-07-18 DIAGNOSIS — S46912D Strain of unspecified muscle, fascia and tendon at shoulder and upper arm level, left arm, subsequent encounter: Secondary | ICD-10-CM | POA: Diagnosis not present

## 2023-07-18 DIAGNOSIS — S46912A Strain of unspecified muscle, fascia and tendon at shoulder and upper arm level, left arm, initial encounter: Secondary | ICD-10-CM | POA: Diagnosis not present

## 2023-07-27 DIAGNOSIS — S46912D Strain of unspecified muscle, fascia and tendon at shoulder and upper arm level, left arm, subsequent encounter: Secondary | ICD-10-CM | POA: Diagnosis not present

## 2023-07-27 DIAGNOSIS — S46912A Strain of unspecified muscle, fascia and tendon at shoulder and upper arm level, left arm, initial encounter: Secondary | ICD-10-CM | POA: Diagnosis not present

## 2023-08-01 DIAGNOSIS — S46912D Strain of unspecified muscle, fascia and tendon at shoulder and upper arm level, left arm, subsequent encounter: Secondary | ICD-10-CM | POA: Diagnosis not present

## 2023-08-01 DIAGNOSIS — S46912A Strain of unspecified muscle, fascia and tendon at shoulder and upper arm level, left arm, initial encounter: Secondary | ICD-10-CM | POA: Diagnosis not present

## 2023-08-09 DIAGNOSIS — M542 Cervicalgia: Secondary | ICD-10-CM | POA: Diagnosis not present

## 2023-08-09 DIAGNOSIS — G518 Other disorders of facial nerve: Secondary | ICD-10-CM | POA: Diagnosis not present

## 2023-08-09 DIAGNOSIS — M791 Myalgia, unspecified site: Secondary | ICD-10-CM | POA: Diagnosis not present

## 2023-08-09 DIAGNOSIS — G43719 Chronic migraine without aura, intractable, without status migrainosus: Secondary | ICD-10-CM | POA: Diagnosis not present

## 2023-09-07 DIAGNOSIS — I10 Essential (primary) hypertension: Secondary | ICD-10-CM | POA: Diagnosis not present

## 2023-09-07 DIAGNOSIS — E1140 Type 2 diabetes mellitus with diabetic neuropathy, unspecified: Secondary | ICD-10-CM | POA: Diagnosis not present

## 2023-09-07 DIAGNOSIS — F909 Attention-deficit hyperactivity disorder, unspecified type: Secondary | ICD-10-CM | POA: Diagnosis not present

## 2023-09-07 DIAGNOSIS — E7800 Pure hypercholesterolemia, unspecified: Secondary | ICD-10-CM | POA: Diagnosis not present

## 2023-09-07 DIAGNOSIS — Z794 Long term (current) use of insulin: Secondary | ICD-10-CM | POA: Diagnosis not present

## 2023-09-25 DIAGNOSIS — M542 Cervicalgia: Secondary | ICD-10-CM | POA: Diagnosis not present

## 2023-09-25 DIAGNOSIS — M791 Myalgia, unspecified site: Secondary | ICD-10-CM | POA: Diagnosis not present

## 2023-09-25 DIAGNOSIS — G43719 Chronic migraine without aura, intractable, without status migrainosus: Secondary | ICD-10-CM | POA: Diagnosis not present

## 2023-09-25 DIAGNOSIS — G518 Other disorders of facial nerve: Secondary | ICD-10-CM | POA: Diagnosis not present

## 2023-11-08 DIAGNOSIS — M791 Myalgia, unspecified site: Secondary | ICD-10-CM | POA: Diagnosis not present

## 2023-11-08 DIAGNOSIS — G518 Other disorders of facial nerve: Secondary | ICD-10-CM | POA: Diagnosis not present

## 2023-11-08 DIAGNOSIS — M542 Cervicalgia: Secondary | ICD-10-CM | POA: Diagnosis not present

## 2023-11-08 DIAGNOSIS — G43719 Chronic migraine without aura, intractable, without status migrainosus: Secondary | ICD-10-CM | POA: Diagnosis not present

## 2023-12-07 DIAGNOSIS — Z7985 Long-term (current) use of injectable non-insulin antidiabetic drugs: Secondary | ICD-10-CM | POA: Diagnosis not present

## 2023-12-07 DIAGNOSIS — Z794 Long term (current) use of insulin: Secondary | ICD-10-CM | POA: Diagnosis not present

## 2023-12-07 DIAGNOSIS — Z7984 Long term (current) use of oral hypoglycemic drugs: Secondary | ICD-10-CM | POA: Diagnosis not present

## 2023-12-07 DIAGNOSIS — F319 Bipolar disorder, unspecified: Secondary | ICD-10-CM | POA: Diagnosis not present

## 2023-12-07 DIAGNOSIS — F411 Generalized anxiety disorder: Secondary | ICD-10-CM | POA: Diagnosis not present

## 2023-12-07 DIAGNOSIS — I10 Essential (primary) hypertension: Secondary | ICD-10-CM | POA: Diagnosis not present

## 2023-12-07 DIAGNOSIS — E1140 Type 2 diabetes mellitus with diabetic neuropathy, unspecified: Secondary | ICD-10-CM | POA: Diagnosis not present

## 2023-12-07 DIAGNOSIS — E7800 Pure hypercholesterolemia, unspecified: Secondary | ICD-10-CM | POA: Diagnosis not present

## 2023-12-25 DIAGNOSIS — G43719 Chronic migraine without aura, intractable, without status migrainosus: Secondary | ICD-10-CM | POA: Diagnosis not present

## 2023-12-25 DIAGNOSIS — M791 Myalgia, unspecified site: Secondary | ICD-10-CM | POA: Diagnosis not present

## 2023-12-25 DIAGNOSIS — G518 Other disorders of facial nerve: Secondary | ICD-10-CM | POA: Diagnosis not present

## 2023-12-25 DIAGNOSIS — M542 Cervicalgia: Secondary | ICD-10-CM | POA: Diagnosis not present

## 2024-01-18 DIAGNOSIS — Z794 Long term (current) use of insulin: Secondary | ICD-10-CM | POA: Diagnosis not present

## 2024-01-18 DIAGNOSIS — I10 Essential (primary) hypertension: Secondary | ICD-10-CM | POA: Diagnosis not present

## 2024-01-18 DIAGNOSIS — F319 Bipolar disorder, unspecified: Secondary | ICD-10-CM | POA: Diagnosis not present

## 2024-01-18 DIAGNOSIS — E1140 Type 2 diabetes mellitus with diabetic neuropathy, unspecified: Secondary | ICD-10-CM | POA: Diagnosis not present

## 2024-02-07 DIAGNOSIS — M542 Cervicalgia: Secondary | ICD-10-CM | POA: Diagnosis not present

## 2024-02-07 DIAGNOSIS — G43719 Chronic migraine without aura, intractable, without status migrainosus: Secondary | ICD-10-CM | POA: Diagnosis not present

## 2024-02-07 DIAGNOSIS — G518 Other disorders of facial nerve: Secondary | ICD-10-CM | POA: Diagnosis not present

## 2024-02-07 DIAGNOSIS — M791 Myalgia, unspecified site: Secondary | ICD-10-CM | POA: Diagnosis not present

## 2024-03-13 DIAGNOSIS — F411 Generalized anxiety disorder: Secondary | ICD-10-CM | POA: Diagnosis not present

## 2024-03-13 DIAGNOSIS — Z794 Long term (current) use of insulin: Secondary | ICD-10-CM | POA: Diagnosis not present

## 2024-03-13 DIAGNOSIS — F319 Bipolar disorder, unspecified: Secondary | ICD-10-CM | POA: Diagnosis not present

## 2024-03-13 DIAGNOSIS — E1140 Type 2 diabetes mellitus with diabetic neuropathy, unspecified: Secondary | ICD-10-CM | POA: Diagnosis not present

## 2024-03-26 DIAGNOSIS — M791 Myalgia, unspecified site: Secondary | ICD-10-CM | POA: Diagnosis not present

## 2024-03-26 DIAGNOSIS — G43719 Chronic migraine without aura, intractable, without status migrainosus: Secondary | ICD-10-CM | POA: Diagnosis not present

## 2024-03-26 DIAGNOSIS — M542 Cervicalgia: Secondary | ICD-10-CM | POA: Diagnosis not present

## 2024-03-26 DIAGNOSIS — G518 Other disorders of facial nerve: Secondary | ICD-10-CM | POA: Diagnosis not present

## 2024-05-08 DIAGNOSIS — G518 Other disorders of facial nerve: Secondary | ICD-10-CM | POA: Diagnosis not present

## 2024-05-08 DIAGNOSIS — M791 Myalgia, unspecified site: Secondary | ICD-10-CM | POA: Diagnosis not present

## 2024-05-08 DIAGNOSIS — G43719 Chronic migraine without aura, intractable, without status migrainosus: Secondary | ICD-10-CM | POA: Diagnosis not present

## 2024-05-08 DIAGNOSIS — M542 Cervicalgia: Secondary | ICD-10-CM | POA: Diagnosis not present

## 2024-05-29 DIAGNOSIS — Z23 Encounter for immunization: Secondary | ICD-10-CM | POA: Diagnosis not present

## 2024-05-29 DIAGNOSIS — F3170 Bipolar disorder, currently in remission, most recent episode unspecified: Secondary | ICD-10-CM | POA: Diagnosis not present

## 2024-06-13 DIAGNOSIS — F5101 Primary insomnia: Secondary | ICD-10-CM | POA: Diagnosis not present

## 2024-06-13 DIAGNOSIS — Z794 Long term (current) use of insulin: Secondary | ICD-10-CM | POA: Diagnosis not present

## 2024-06-13 DIAGNOSIS — E1140 Type 2 diabetes mellitus with diabetic neuropathy, unspecified: Secondary | ICD-10-CM | POA: Diagnosis not present

## 2024-06-13 DIAGNOSIS — F411 Generalized anxiety disorder: Secondary | ICD-10-CM | POA: Diagnosis not present

## 2024-06-13 DIAGNOSIS — F319 Bipolar disorder, unspecified: Secondary | ICD-10-CM | POA: Diagnosis not present

## 2024-06-13 DIAGNOSIS — E1142 Type 2 diabetes mellitus with diabetic polyneuropathy: Secondary | ICD-10-CM | POA: Diagnosis not present

## 2024-06-13 DIAGNOSIS — K439 Ventral hernia without obstruction or gangrene: Secondary | ICD-10-CM | POA: Diagnosis not present

## 2024-06-17 DIAGNOSIS — G518 Other disorders of facial nerve: Secondary | ICD-10-CM | POA: Diagnosis not present

## 2024-06-17 DIAGNOSIS — G43719 Chronic migraine without aura, intractable, without status migrainosus: Secondary | ICD-10-CM | POA: Diagnosis not present

## 2024-06-17 DIAGNOSIS — M542 Cervicalgia: Secondary | ICD-10-CM | POA: Diagnosis not present

## 2024-06-17 DIAGNOSIS — M791 Myalgia, unspecified site: Secondary | ICD-10-CM | POA: Diagnosis not present

## 2024-07-23 DIAGNOSIS — Z794 Long term (current) use of insulin: Secondary | ICD-10-CM | POA: Diagnosis not present

## 2024-07-23 DIAGNOSIS — E1140 Type 2 diabetes mellitus with diabetic neuropathy, unspecified: Secondary | ICD-10-CM | POA: Diagnosis not present
